# Patient Record
Sex: Female | Born: 1970
Health system: Southern US, Community
[De-identification: ages and names within clinical notes are randomized; demographics above are authoritative.]

## PROBLEM LIST (undated history)

## (undated) DIAGNOSIS — I998 Other disorder of circulatory system: Secondary | ICD-10-CM

## (undated) DIAGNOSIS — I2699 Other pulmonary embolism without acute cor pulmonale: Secondary | ICD-10-CM

## (undated) DIAGNOSIS — K219 Gastro-esophageal reflux disease without esophagitis: Secondary | ICD-10-CM

## (undated) DIAGNOSIS — D649 Anemia, unspecified: Secondary | ICD-10-CM

## (undated) DIAGNOSIS — I10 Essential (primary) hypertension: Secondary | ICD-10-CM

## (undated) DIAGNOSIS — Z803 Family history of malignant neoplasm of breast: Secondary | ICD-10-CM

## (undated) DIAGNOSIS — C50919 Malignant neoplasm of unspecified site of unspecified female breast: Secondary | ICD-10-CM

## (undated) HISTORY — DX: Anemia, unspecified: D64.9

## (undated) HISTORY — DX: Family history of malignant neoplasm of breast: Z80.3

## (undated) HISTORY — PX: TONSILLECTOMY: SUR1361

## (undated) HISTORY — DX: Other disorder of circulatory system: I99.8

## (undated) HISTORY — DX: Gastro-esophageal reflux disease without esophagitis: K21.9

## (undated) HISTORY — DX: Other pulmonary embolism without acute cor pulmonale: I26.99

---

## 2009-10-29 ENCOUNTER — Ambulatory Visit: Payer: Self-pay

## 2013-01-08 ENCOUNTER — Emergency Department: Payer: Self-pay | Admitting: Internal Medicine

## 2013-05-08 ENCOUNTER — Ambulatory Visit: Payer: Self-pay | Admitting: Internal Medicine

## 2014-01-20 DIAGNOSIS — I10 Essential (primary) hypertension: Secondary | ICD-10-CM | POA: Diagnosis present

## 2016-05-31 ENCOUNTER — Other Ambulatory Visit
Admission: RE | Admit: 2016-05-31 | Discharge: 2016-05-31 | Disposition: A | Discharge status: Home / Self Care | Payer: BLUE CROSS/BLUE SHIELD | Source: Ambulatory Visit | Attending: Internal Medicine | Admitting: Internal Medicine

## 2016-05-31 ENCOUNTER — Other Ambulatory Visit: Payer: Self-pay

## 2016-05-31 ENCOUNTER — Encounter: Payer: Self-pay | Admitting: Emergency Medicine

## 2016-05-31 ENCOUNTER — Emergency Department
Admission: EM | Admit: 2016-05-31 | Discharge: 2016-05-31 | Disposition: A | Discharge status: Home / Self Care | Payer: BLUE CROSS/BLUE SHIELD | Attending: Emergency Medicine | Admitting: Emergency Medicine

## 2016-05-31 ENCOUNTER — Emergency Department: Payer: BLUE CROSS/BLUE SHIELD

## 2016-05-31 DIAGNOSIS — R079 Chest pain, unspecified: Secondary | ICD-10-CM | POA: Diagnosis not present

## 2016-05-31 DIAGNOSIS — R1013 Epigastric pain: Secondary | ICD-10-CM

## 2016-05-31 DIAGNOSIS — F17200 Nicotine dependence, unspecified, uncomplicated: Secondary | ICD-10-CM | POA: Insufficient documentation

## 2016-05-31 DIAGNOSIS — R51 Headache: Secondary | ICD-10-CM | POA: Insufficient documentation

## 2016-05-31 DIAGNOSIS — I10 Essential (primary) hypertension: Secondary | ICD-10-CM | POA: Diagnosis not present

## 2016-05-31 DIAGNOSIS — M549 Dorsalgia, unspecified: Secondary | ICD-10-CM | POA: Insufficient documentation

## 2016-05-31 HISTORY — DX: Essential (primary) hypertension: I10

## 2016-05-31 LAB — CBC WITH DIFFERENTIAL/PLATELET
BASOS ABS: 0.1 10*3/uL (ref 0–0.1)
BASOS PCT: 1 %
EOS ABS: 0 10*3/uL (ref 0–0.7)
Eosinophils Relative: 0 %
HEMATOCRIT: 45.6 % (ref 35.0–47.0)
HEMOGLOBIN: 15.6 g/dL (ref 12.0–16.0)
Lymphocytes Relative: 13 %
Lymphs Abs: 1.7 10*3/uL (ref 1.0–3.6)
MCH: 31.7 pg (ref 26.0–34.0)
MCHC: 34.2 g/dL (ref 32.0–36.0)
MCV: 92.7 fL (ref 80.0–100.0)
MONOS PCT: 5 %
Monocytes Absolute: 0.6 10*3/uL (ref 0.2–0.9)
NEUTROS ABS: 10.6 10*3/uL — AB (ref 1.4–6.5)
NEUTROS PCT: 81 %
Platelets: 257 10*3/uL (ref 150–440)
RBC: 4.92 MIL/uL (ref 3.80–5.20)
RDW: 14.1 % (ref 11.5–14.5)
WBC: 13 10*3/uL — AB (ref 3.6–11.0)

## 2016-05-31 LAB — COMPREHENSIVE METABOLIC PANEL
ALT: 21 U/L (ref 14–54)
ANION GAP: 8 (ref 5–15)
AST: 24 U/L (ref 15–41)
Albumin: 4.4 g/dL (ref 3.5–5.0)
Alkaline Phosphatase: 52 U/L (ref 38–126)
BUN: 8 mg/dL (ref 6–20)
CHLORIDE: 104 mmol/L (ref 101–111)
CO2: 25 mmol/L (ref 22–32)
Calcium: 9.1 mg/dL (ref 8.9–10.3)
Creatinine, Ser: 0.65 mg/dL (ref 0.44–1.00)
GFR calc non Af Amer: >60 mL/min (ref >60)
Glucose, Bld: 117 mg/dL — ABNORMAL HIGH (ref 65–99)
POTASSIUM: 3.8 mmol/L (ref 3.5–5.1)
SODIUM: 137 mmol/L (ref 135–145)
Total Bilirubin: 0.6 mg/dL (ref 0.3–1.2)
Total Protein: 7.8 g/dL (ref 6.5–8.1)

## 2016-05-31 LAB — TSH: TSH: 0.39 u[IU]/mL (ref 0.350–4.500)

## 2016-05-31 LAB — TROPONIN I
TROPONIN I: 0.04 ng/mL — AB (ref <0.03)
Troponin I: <0.03 ng/mL (ref <0.03)

## 2016-05-31 LAB — LIPASE, BLOOD: LIPASE: 30 U/L (ref 11–51)

## 2016-05-31 MED ORDER — BUTALBITAL-APAP-CAFFEINE 50-325-40 MG PO TABS
1.0000 | ORAL_TABLET | Freq: Once | ORAL | Status: AC
  Administered 2016-05-31: 1 via ORAL
  Filled 2016-05-31: qty 1

## 2016-05-31 MED ORDER — GI COCKTAIL ~~LOC~~
30.0000 mL | Freq: Once | ORAL | Status: AC
  Administered 2016-05-31: 30 mL via ORAL
  Filled 2016-05-31: qty 30

## 2016-05-31 MED ORDER — BUTALBITAL-APAP-CAFFEINE 50-325-40 MG PO TABS
ORAL_TABLET | ORAL | Status: AC
  Filled 2016-05-31: qty 1

## 2016-05-31 NOTE — ED Provider Notes (Signed)
[facility] Emergency Department Provider Note    ____________________________________________   I have reviewed the triage vital signs and the nursing notes.   HISTORY  Chief Complaint Abnormal lab  History limited by: Not Limited   HPI Penny Chase is a 45 y.o. female who presents to the emergency department today at the request of her primary care physician because of concern for abnormal blood work. Patient saw her physician today for a scheduled annual exam. While there she discussed that for the past few months she has felt bad. She says that she has had decreased energy, headache, occasional chest pain. No chest pressure. No concerning shortness of breath.   Past Medical History:  Diagnosis Date  . Hypertension     There are no active problems to display for this patient.   History reviewed. No pertinent surgical history.  Prior to Admission medications   Not on File    Allergies Review of patient's allergies indicates no known allergies.  History reviewed. No pertinent family history.  Social History Social History  Substance Use Topics  . Smoking status: Former Research scientist (life sciences)  . Smokeless tobacco: Current User  . Alcohol use Yes    Review of Systems  Constitutional: Negative for fever. Cardiovascular: Positive for occasional chest pain. Respiratory: Negative for shortness of breath. Gastrointestinal: Positive for epigastric pain. Genitourinary: Negative for dysuria. Musculoskeletal: Positive for back pain. Skin: Negative for rash. Neurological: Negative for headaches, focal weakness or numbness.  10-point ROS otherwise negative.  ____________________________________________   PHYSICAL EXAM:  VITAL SIGNS: ED Triage Vitals  Enc Vitals Group     BP 05/31/16 1118 (!) 162/108     Pulse Rate 05/31/16 1118 92     Resp 05/31/16 1118 18     Temp 05/31/16 1118 98.9 F (37.2 C)     Temp Source 05/31/16 1118 Oral     SpO2  05/31/16 1118 97 %     Weight 05/31/16 1119 185 lb (83.9 kg)     Height 05/31/16 1119 5\' 5"  (1.651 m)     Head Circumference --      Peak Flow --      Pain Score 05/31/16 1119 9   Constitutional: Alert and oriented. Well appearing and in no distress. Eyes: Conjunctivae are normal. Normal extraocular movements. ENT   Head: Normocephalic and atraumatic.   Nose: No congestion/rhinnorhea.   Mouth/Throat: Mucous membranes are moist.   Neck: No stridor. Hematological/Lymphatic/Immunilogical: No cervical lymphadenopathy. Cardiovascular: Normal rate, regular rhythm.  No murmurs, rubs, or gallops. Respiratory: Normal respiratory effort without tachypnea nor retractions. Breath sounds are clear and equal bilaterally. No wheezes/rales/rhonchi. Gastrointestinal: Soft and nontender. No distention.  Genitourinary: Deferred Musculoskeletal: Normal range of motion in all extremities. No lower extremity edema. Neurologic:  Normal speech and language. No gross focal neurologic deficits are appreciated.  Skin:  Skin is warm, dry and intact. No rash noted. Psychiatric: Mood and affect are normal. Speech and behavior are normal. Patient exhibits appropriate insight and judgment.  ____________________________________________    LABS (pertinent positives/negatives)  Labs Reviewed  CBC WITH DIFFERENTIAL/PLATELET - Abnormal; Notable for the following:       Result Value   WBC 13.0 (*)    Neutro Abs 10.6 (*)    All other components within normal limits  COMPREHENSIVE METABOLIC PANEL - Abnormal; Notable for the following:    Glucose, Bld 117 (*)    All other components within normal limits  TROPONIN I  TSH  LIPASE, BLOOD  TROPONIN I     ____________________________________________   EKG  I, Nance Pear, attending physician, personally viewed and interpreted this EKG  EKG Time: 1116 Rate: 91 Rhythm: normal sinus rhythm Axis: normal Intervals: qtc 462 QRS: narrow ST  changes: no st elevation Impression: normal ekg   ____________________________________________    RADIOLOGY  CXR   IMPRESSION:  No active cardiopulmonary disease.     ____________________________________________   PROCEDURES  Procedures  ____________________________________________   INITIAL IMPRESSION / ASSESSMENT AND PLAN / ED COURSE  Pertinent labs & imaging results that were available during my care of the patient were reviewed by me and considered in my medical decision making (see chart for details).  Patient with outpatient troponin of 0.04. Two troponins here negative. Additionally no other concerning findings on work up and clinical story not concerning for ACS. Feel patient is safe for discharge. Will have patient follow up with pcp. ____________________________________________   FINAL CLINICAL IMPRESSION(S) / ED DIAGNOSES  Final diagnoses:  Nonspecific chest pain     Note: This dictation was prepared with Dragon dictation. Any transcriptional errors that result from this process are unintentional    Nance Pear, MD 05/31/16 1530

## 2016-05-31 NOTE — ED Notes (Signed)
AAOx3.  Skin warm and dry. Patient tearful in room, stating "I just feel so bad".  Emotional support and reassurance given, well received.  Continue to monitor.

## 2016-05-31 NOTE — ED Triage Notes (Signed)
Pt to ed with c/o elevated blood pressure and chest pain since Friday.  Pt states went to see md and was sent here after labs showed elevated troponin. Pt reports chest pressure at triage.  BP 162/108.  Family with pt.

## 2016-05-31 NOTE — ED Notes (Signed)
Pt discharged home after verbalizing understanding of discharge instructions; nad noted. 

## 2016-05-31 NOTE — Discharge Instructions (Signed)
Please seek medical attention for any high fevers, chest pain, shortness of breath, change in behavior, persistent vomiting, bloody stool or any other new or concerning symptoms.  

## 2016-05-31 NOTE — ED Notes (Signed)
AAOx3.  Skin warm and dry.  States "I'm feeling better".  Continue to monitor.

## 2017-01-04 ENCOUNTER — Other Ambulatory Visit: Payer: Self-pay | Admitting: Internal Medicine

## 2017-01-04 DIAGNOSIS — Z1231 Encounter for screening mammogram for malignant neoplasm of breast: Secondary | ICD-10-CM

## 2017-01-04 DIAGNOSIS — K219 Gastro-esophageal reflux disease without esophagitis: Secondary | ICD-10-CM | POA: Diagnosis present

## 2017-01-20 ENCOUNTER — Ambulatory Visit
Admission: RE | Admit: 2017-01-20 | Discharge: 2017-01-20 | Disposition: A | Discharge status: Home / Self Care | Payer: BLUE CROSS/BLUE SHIELD | Source: Ambulatory Visit | Attending: Internal Medicine | Admitting: Internal Medicine

## 2017-01-20 DIAGNOSIS — Z1231 Encounter for screening mammogram for malignant neoplasm of breast: Secondary | ICD-10-CM | POA: Insufficient documentation

## 2018-07-18 ENCOUNTER — Other Ambulatory Visit: Payer: Self-pay | Admitting: Internal Medicine

## 2018-07-18 DIAGNOSIS — Z1231 Encounter for screening mammogram for malignant neoplasm of breast: Secondary | ICD-10-CM

## 2018-08-16 DIAGNOSIS — Z923 Personal history of irradiation: Secondary | ICD-10-CM

## 2018-08-16 HISTORY — DX: Personal history of irradiation: Z92.3

## 2018-08-24 ENCOUNTER — Ambulatory Visit
Admission: RE | Admit: 2018-08-24 | Discharge: 2018-08-24 | Disposition: A | Discharge status: Home / Self Care | Payer: Self-pay | Source: Ambulatory Visit | Attending: Internal Medicine | Admitting: Internal Medicine

## 2018-08-24 DIAGNOSIS — Z1231 Encounter for screening mammogram for malignant neoplasm of breast: Secondary | ICD-10-CM | POA: Insufficient documentation

## 2018-08-28 ENCOUNTER — Other Ambulatory Visit: Payer: Self-pay | Admitting: Internal Medicine

## 2018-08-28 DIAGNOSIS — R928 Other abnormal and inconclusive findings on diagnostic imaging of breast: Secondary | ICD-10-CM

## 2018-09-06 ENCOUNTER — Ambulatory Visit
Admission: RE | Admit: 2018-09-06 | Discharge: 2018-09-06 | Disposition: A | Discharge status: Home / Self Care | Payer: BLUE CROSS/BLUE SHIELD | Source: Ambulatory Visit | Attending: Internal Medicine | Admitting: Internal Medicine

## 2018-09-06 DIAGNOSIS — R928 Other abnormal and inconclusive findings on diagnostic imaging of breast: Secondary | ICD-10-CM | POA: Diagnosis present

## 2018-09-07 ENCOUNTER — Other Ambulatory Visit: Payer: Self-pay | Admitting: Internal Medicine

## 2018-09-07 DIAGNOSIS — R928 Other abnormal and inconclusive findings on diagnostic imaging of breast: Secondary | ICD-10-CM

## 2018-09-07 DIAGNOSIS — N6310 Unspecified lump in the right breast, unspecified quadrant: Secondary | ICD-10-CM

## 2018-09-12 ENCOUNTER — Ambulatory Visit
Admission: RE | Admit: 2018-09-12 | Discharge: 2018-09-12 | Disposition: A | Discharge status: Home / Self Care | Payer: BLUE CROSS/BLUE SHIELD | Source: Ambulatory Visit | Attending: Internal Medicine | Admitting: Internal Medicine

## 2018-09-12 DIAGNOSIS — R928 Other abnormal and inconclusive findings on diagnostic imaging of breast: Secondary | ICD-10-CM

## 2018-09-12 DIAGNOSIS — N6310 Unspecified lump in the right breast, unspecified quadrant: Secondary | ICD-10-CM

## 2018-09-12 HISTORY — PX: BREAST BIOPSY: SHX20

## 2018-09-13 ENCOUNTER — Other Ambulatory Visit: Payer: Self-pay | Admitting: Specialist

## 2018-09-16 DIAGNOSIS — C801 Malignant (primary) neoplasm, unspecified: Secondary | ICD-10-CM

## 2018-09-16 HISTORY — DX: Malignant (primary) neoplasm, unspecified: C80.1

## 2018-09-18 ENCOUNTER — Encounter: Payer: Self-pay | Admitting: *Deleted

## 2018-09-18 ENCOUNTER — Other Ambulatory Visit: Payer: Self-pay | Admitting: Pathology

## 2018-09-18 DIAGNOSIS — C50911 Malignant neoplasm of unspecified site of right female breast: Secondary | ICD-10-CM

## 2018-09-18 LAB — SURGICAL PATHOLOGY

## 2018-09-18 NOTE — Progress Notes (Signed)
Patient returned my call.  Informed her of her appointment to see Dr. Peyton Najjar on 09/19/18 @ 3:15 and Dr. Rogue Bussing on 10/19/18 @ 3;00.

## 2018-09-18 NOTE — Progress Notes (Signed)
Tried to call patient back with her appointments, but no answer and no voicemail.  Will call her back again later today.

## 2018-09-18 NOTE — Progress Notes (Signed)
  Oncology Nurse Navigator Documentation  Navigator Location: [facility] (09/18/18 1300) Referral date to RadOnc/MedOnc: 09/20/18 (09/18/18 1300) )Navigator Encounter Type: Introductory phone call (09/18/18 1300)   Abnormal Finding Date: 09/06/18 (09/18/18 1300) Confirmed Diagnosis Date: 10/12/18 (09/18/18 1300)                   Barriers/Navigation Needs: Coordination of Care (09/18/18 1300)   Interventions: Coordination of Care (09/18/18 1300)   Coordination of Care: Appts (09/18/18 1300)                  Time Spent with Patient: 45 (09/18/18 1300)   Called patient to establish navigation services.  She is newly diagnosed with invasive mammary carcinoma.  She has been given the results of her biopsy by the radiologist and was informed one of the navigators would give her a call.  States she would like to see Dr. Rogue Bussing and Dr. Peyton Najjar since she usually goes to [facility].  I will call her back with her appointments.

## 2018-09-18 NOTE — Addendum Note (Signed)
Addended by: Rico Junker on: 09/18/2018 02:15 PM   Modules accepted: Orders

## 2018-09-19 ENCOUNTER — Other Ambulatory Visit: Payer: Self-pay | Admitting: General Surgery

## 2018-09-19 ENCOUNTER — Ambulatory Visit: Payer: Self-pay | Admitting: General Surgery

## 2018-09-19 DIAGNOSIS — C50411 Malignant neoplasm of upper-outer quadrant of right female breast: Secondary | ICD-10-CM

## 2018-09-19 DIAGNOSIS — Z170 Estrogen receptor positive status [ER+]: Principal | ICD-10-CM

## 2018-09-19 NOTE — H&P (Signed)
PATIENT PROFILE: Penny Chase is a 48 y.o. female who presents to the Clinic for consultation at the request of Dr. Ouida Sills for evaluation of right breast cancer.  PCP:  Harrold Donath, MD  HISTORY OF PRESENT ILLNESS: [name] reports she had screening mammogram. She has never had biopsy before. On the screening mammogram done on 08/24/2018. It was found right breast distortion. There was no findings suspicious of malignancy. A diagnostic mammogram and ultrasound were done on 09/06/2018 that confirmed the a 1.1 cm mass on the right breast at the 10 o'clock position. There was no suspicious lymph node on the axilla. Ultrasound guided biopsy shows a invasive mammary carcinoma with lobular features, grade 1, ER/PR positive, 6 mm on that sections.   Patient denies any breast pain, palpable mass, skin changes, nipple discharge or retraction.   Family history of breast cancer: Aunt from father side Family history of other cancers: None Menarche: 11 years Menopause: n/a Used OCP: for 19 years Used estrogen and progesterone therapy: none Pregnancies: none History of Radiation to the chest: none Previous biopsy: nonediaz  PROBLEM LIST:         Problem List  Date Reviewed: 01/10/2018         Noted   Gastroesophageal reflux disease without esophagitis 01/04/2017   Overview    Helped by prn ppi      Health care maintenance 05/31/2016   Overview    Mammogram 9-14. Pap nl 9-14 and wants to do 2020. Tetanus 2014      HTN (hypertension), benign 01/20/2014      GENERAL REVIEW OF SYSTEMS:   General ROS: negative for - chills, fever, weight gain or weight loss. Positive for fatigue Allergy and Immunology ROS: negative for - hives  Hematological and Lymphatic ROS: negative for - bleeding problems or bruising, negative for palpable nodes Endocrine ROS: negative for - heat or cold intolerance, hair changes Respiratory ROS: positive for - cough, Negative for  shortness of breath or wheezing Cardiovascular ROS: no chest pain or palpitations GI ROS: negative for nausea, vomiting, abdominal pain, diarrhea. Positive for constipation Musculoskeletal ROS: negative for - joint swelling or muscle pain Neurological ROS: negative for - confusion, syncope Dermatological ROS: negative for pruritus and rash Psychiatric: negative for anxiety, depression, and memory loss. Positive for difficulty sleeping   MEDICATIONS: CurrentMedications        Current Outpatient Medications  Medication Sig Dispense Refill  . amLODIPine (NORVASC) 5 MG tablet Take 1 tablet (5 mg total) by mouth once daily 90 tablet 3  . pantoprazole (PROTONIX) 40 MG DR tablet Take 1 tablet (40 mg total) by mouth once daily as needed 90 tablet 3   No current facility-administered medications for this visit.       ALLERGIES: Patient has no known allergies.  PAST MEDICAL HISTORY:     Past Medical History:  Diagnosis Date  . HTN (hypertension), benign 01/20/2014    PAST SURGICAL HISTORY:      Past Surgical History:  Procedure Laterality Date  . TONSILLECTOMY       FAMILY HISTORY:      Family History  Problem Relation Age of Onset  . Diabetes type II Mother      SOCIAL HISTORY: Social History          Socioeconomic History  . Marital status: Married    Spouse name: Not on file  . Number of children: Not on file  . Years of education: Not on file  .  Highest education level: Not on file  Occupational History  . Not on file  Social Needs  . Financial resource strain: Not on file  . Food insecurity:    Worry: Not on file    Inability: Not on file  . Transportation needs:    Medical: Not on file    Non-medical: Not on file  Tobacco Use  . Smoking status: Never Smoker  . Smokeless tobacco: Never Used  Substance and Sexual Activity  . Alcohol use: No  . Drug use: Not on file  . Sexual activity: Not on file  Other Topics Concern  . Not on  file  Social History Narrative  . Not on file      PHYSICAL EXAM:    Vitals:   09/19/18 1505  BP: (!) 150/96  Pulse: 77   Body mass index is 29.24 kg/m. Weight: 79.7 kg (175 lb 11.3 oz)   GENERAL: Alert, active, oriented x3  HEENT: Pupils equal reactive to light. Extraocular movements are intact. Sclera clear. Palpebral conjunctiva normal red color.Pharynx clear.  NECK: Supple with no palpable mass and no adenopathy.  LUNGS: Sound clear with no rales rhonchi or wheezes.  HEART: Regular rhythm S1 and S2 without murmur.  BREAST: both breast appear normal, no suspicious masses, no skin or nipple changes or axillary nodes. There was a small bruise on the lateral aspect of the right breast.   ABDOMEN: Soft and depressible, nontender with no palpable mass, no hepatomegaly.  EXTREMITIES: Well-developed well-nourished symmetrical with no dependent edema.  NEUROLOGICAL: Awake alert oriented, facial expression symmetrical, moving all extremities.  REVIEW OF DATA: I have reviewed the following data today:      Appointment on 07/11/2018  Component Date Value  . Glucose 07/11/2018 102   . Sodium 07/11/2018 140   . Potassium 07/11/2018 4.3   . Chloride 07/11/2018 102   . Carbon Dioxide (CO2) 07/11/2018 27.4   . Calcium 07/11/2018 9.7   . Urea Nitrogen (BUN) 07/11/2018 8   . Creatinine 07/11/2018 0.6   . Glomerular Filtration Ra* 07/11/2018 107   . BUN/Crea Ratio 07/11/2018 13.3   . Anion Gap w/K 07/11/2018 14.9   . Hemoglobin A1C 07/11/2018 5.5   . Average Blood Glucose (C* 07/11/2018 111   . Protein, Total 07/11/2018 7.7   . Albumin 07/11/2018 4.5   . Bilirubin, Total 07/11/2018 0.4   . Bilirubin, Conjugated 07/11/2018 0.10   . Alk Phos (alkaline Phosp* 07/11/2018 82   . AST  07/11/2018 17   . ALT  07/11/2018 17   . Cholesterol, Total 07/11/2018 223*  . Triglyceride 07/11/2018 157   . HDL (High Density Lipopr* 07/11/2018 39.6   . LDL (Low Density  Lipopro* 07/11/2018 152*  . VLDL Cholesterol 07/11/2018 31   . Cholesterol/HDL Ratio 07/11/2018 5.6     SPECIMEN SUBMITTED: A. Breast, right, 10:00, 6 CMFN  CLINICAL HISTORY: Right breast mass  PRE-OPERATIVE DIAGNOSIS: Suspect malignancy  POST-OPERATIVE DIAGNOSIS: Venus shaped biopsy clip deployed 10 o'clock position  DIAGNOSIS: A. BREAST, RIGHT, 10:00, 6 CM FROM NIPPLE; ULTRASOUND-GUIDED BIOPSY: - INVASIVE MAMMARY CARCINOMA, WITH LOBULAR FEATURES.  Size of invasive carcinoma: 6 mm in this sample Histologic grade of invasive carcinoma: Grade 1            Glandular/tubular differentiation score: 3            Nuclear pleomorphism score: 1            Mitotic rate score: 1  Total score: 5 Ductal carcinoma in situ: Not identified Lymphovascular invasion: Not identified  ER/PR/HER2: Immunohistochemistry will be performed on block A1, with reflex to [place] for HER2 2+. The results will be reported in an addendum.  Comment: The definitive grade will be assigned on the excisional specimen. These findings were communicated to Dannial Monarch in Dr. Johnston Ebbs office on September 13, 2018. Read back procedure was performed.  GROSS DESCRIPTION: A. Labeled: Right breast, 10:00, 6 cm from nipple Received: Formalin Time/date in fixative: 12:58 PM on 09/12/2018 Cold ischemic time: Less than 1 hour Total fixation time: 8 hours Core pieces: 3 Size: Ranging from 1.3 cm up to 1.5 cm in length and 0.2 cm in diameter Description: tan-white fibrous cores Ink color: Blue Entirely submitted in A1-A2.  Final Diagnosis performed by Raynelle Bring, MD.  Electronically signed 09/13/2018 12:43:24PM The electronic signature indicates that the named Attending Pathologist has evaluated the specimen  Technical component performed at [facility], [address] [place], [place] [address] Lab: [phone] Dir: Rush Farmer, MD, MMM Professional component  performed at [facility], [facility], [place], [place], [place] [address] Lab: [phone] Dir: Dellia Nims. Rubinas, MD  ADDENDUM: BREAST BIOMARKER TESTS Estrogen Receptor (ER) Status: POSITIVE            Percentage of cells with nuclear positivity:> 90%            Average intensity of staining: Moderate  Progesterone Receptor (PgR) Status: POSITIVE            Percentage of cells with nuclear positivity:> 90%            Average intensity of staining: Strong  HER2 (by immunohistochemistry): NEGATIVE (Score 0)  I personally evaluated the screening, diagnostic and post biopsy mammogram with marker.   I evaluated the biopsy report and discussed with patient in details.   ASSESSMENT: Ms. Seaborn is a 48 y.o. female presenting for consultation for breast cancer.    Patient was oriented again about the pathology results. Surgical alternatives were discussed with patient including partial vs total mastectomy. Surgical technique and post operative care was discussed with patient. Risk of surgery was discussed with patient including but not limited to: wound infection, seroma, hematoma, brachial plexopathy, mondor's disease (thrombosis of small veins of breast), chronic wound pain, breast lymphedema, altered sensation to the nipple and cosmesis among others.   Patient was oriented that due to the lobular features in the biopsy report, MRI is recommended to rule out any suspicious lesion on the contralateral breast. She understood. If this is negative patient agreed to proceed with partial mastectomy with sentinel lymph node biopsy.   Patient has an appointment tomorrow with Dr. Rogue Bussing were other oncologic treatment of breast cancer will be discussed. Will also participate on the breast conference when her case will be discussed.   Malignant neoplasm of upper-outer quadrant of right breast in female, estrogen receptor positive  (CMS-HCC) [C50.411, Z17.0]  PLAN: 1. Bilateral breast MRI with and without contrast 2.  Right breast partial mastectomy (25498) with Right Axillary sentinel lymph node biopsy (26415) 3. CBC, CMP 4. Will call you back with MRI results and further recommendations 5. Contact us if has any question or concern.   Patient verbalized understanding, all questions were answered, and were agreeable with the plan outlined above.   This was a more than 80 minute encounter most of the time counseling patient and coordinating plan of care.   Herbert Pun, MD  Electronically signed by Reeves Forth  Windell Moment, MD

## 2018-09-19 NOTE — H&P (View-Only) (Signed)
PATIENT PROFILE: Penny Chase is a 48 y.o. female who presents to the Clinic for consultation at the request of Dr. Anderson for evaluation of right breast cancer.  PCP:  Anderson, Marshall Wilson III, MD  HISTORY OF PRESENT ILLNESS: Penny Chase reports she had screening mammogram. She has never had biopsy before. On the screening mammogram done on 08/24/2018. It was found right breast distortion. There was no findings suspicious of malignancy. A diagnostic mammogram and ultrasound were done on 09/06/2018 that confirmed the a 1.1 cm mass on the right breast at the 10 o'clock position. There was no suspicious lymph node on the axilla. Ultrasound guided biopsy shows a invasive mammary carcinoma with lobular features, grade 1, ER/PR positive, 6 mm on that sections.   Patient denies any breast pain, palpable mass, skin changes, nipple discharge or retraction.   Family history of breast cancer: Aunt from father side Family history of other cancers: None Menarche: 11 years Menopause: n/a Used OCP: for 19 years Used estrogen and progesterone therapy: none Pregnancies: none History of Radiation to the chest: none Previous biopsy: nonediaz  PROBLEM LIST:         Problem List  Date Reviewed: 01/10/2018         Noted   Gastroesophageal reflux disease without esophagitis 01/04/2017   Overview    Helped by prn ppi      Health care maintenance 05/31/2016   Overview    Mammogram 9-14. Pap nl 9-14 and wants to do 2020. Tetanus 2014      HTN (hypertension), benign 01/20/2014      GENERAL REVIEW OF SYSTEMS:   General ROS: negative for - chills, fever, weight gain or weight loss. Positive for fatigue Allergy and Immunology ROS: negative for - hives  Hematological and Lymphatic ROS: negative for - bleeding problems or bruising, negative for palpable nodes Endocrine ROS: negative for - heat or cold intolerance, hair changes Respiratory ROS: positive for - cough, Negative for  shortness of breath or wheezing Cardiovascular ROS: no chest pain or palpitations GI ROS: negative for nausea, vomiting, abdominal pain, diarrhea. Positive for constipation Musculoskeletal ROS: negative for - joint swelling or muscle pain Neurological ROS: negative for - confusion, syncope Dermatological ROS: negative for pruritus and rash Psychiatric: negative for anxiety, depression, and memory loss. Positive for difficulty sleeping   MEDICATIONS: CurrentMedications        Current Outpatient Medications  Medication Sig Dispense Refill  . amLODIPine (NORVASC) 5 MG tablet Take 1 tablet (5 mg total) by mouth once daily 90 tablet 3  . pantoprazole (PROTONIX) 40 MG DR tablet Take 1 tablet (40 mg total) by mouth once daily as needed 90 tablet 3   No current facility-administered medications for this visit.       ALLERGIES: Patient has no known allergies.  PAST MEDICAL HISTORY:     Past Medical History:  Diagnosis Date  . HTN (hypertension), benign 01/20/2014    PAST SURGICAL HISTORY:      Past Surgical History:  Procedure Laterality Date  . TONSILLECTOMY       FAMILY HISTORY:      Family History  Problem Relation Age of Onset  . Diabetes type II Mother      SOCIAL HISTORY: Social History          Socioeconomic History  . Marital status: Married    Spouse name: Not on file  . Number of children: Not on file  . Years of education: Not on file  .   Highest education level: Not on file  Occupational History  . Not on file  Social Needs  . Financial resource strain: Not on file  . Food insecurity:    Worry: Not on file    Inability: Not on file  . Transportation needs:    Medical: Not on file    Non-medical: Not on file  Tobacco Use  . Smoking status: Never Smoker  . Smokeless tobacco: Never Used  Substance and Sexual Activity  . Alcohol use: No  . Drug use: Not on file  . Sexual activity: Not on file  Other Topics Concern  . Not on  file  Social History Narrative  . Not on file      PHYSICAL EXAM:    Vitals:   09/19/18 1505  BP: (!) 150/96  Pulse: 77   Body mass index is 29.24 kg/m. Weight: 79.7 kg (175 lb 11.3 oz)   GENERAL: Alert, active, oriented x3  HEENT: Pupils equal reactive to light. Extraocular movements are intact. Sclera clear. Palpebral conjunctiva normal red color.Pharynx clear.  NECK: Supple with no palpable mass and no adenopathy.  LUNGS: Sound clear with no rales rhonchi or wheezes.  HEART: Regular rhythm S1 and S2 without murmur.  BREAST: both breast appear normal, no suspicious masses, no skin or nipple changes or axillary nodes. There was a small bruise on the lateral aspect of the right breast.   ABDOMEN: Soft and depressible, nontender with no palpable mass, no hepatomegaly.  EXTREMITIES: Well-developed well-nourished symmetrical with no dependent edema.  NEUROLOGICAL: Awake alert oriented, facial expression symmetrical, moving all extremities.  REVIEW OF DATA: I have reviewed the following data today:      Appointment on 07/11/2018  Component Date Value  . Glucose 07/11/2018 102   . Sodium 07/11/2018 140   . Potassium 07/11/2018 4.3   . Chloride 07/11/2018 102   . Carbon Dioxide (CO2) 07/11/2018 27.4   . Calcium 07/11/2018 9.7   . Urea Nitrogen (BUN) 07/11/2018 8   . Creatinine 07/11/2018 0.6   . Glomerular Filtration Ra* 07/11/2018 107   . BUN/Crea Ratio 07/11/2018 13.3   . Anion Gap w/K 07/11/2018 14.9   . Hemoglobin A1C 07/11/2018 5.5   . Average Blood Glucose (C* 07/11/2018 111   . Protein, Total 07/11/2018 7.7   . Albumin 07/11/2018 4.5   . Bilirubin, Total 07/11/2018 0.4   . Bilirubin, Conjugated 07/11/2018 0.10   . Alk Phos (alkaline Phosp* 07/11/2018 82   . AST  07/11/2018 17   . ALT  07/11/2018 17   . Cholesterol, Total 07/11/2018 223*  . Triglyceride 07/11/2018 157   . HDL (High Density Lipopr* 07/11/2018 39.6   . LDL (Low Density  Lipopro* 07/11/2018 152*  . VLDL Cholesterol 07/11/2018 31   . Cholesterol/HDL Ratio 07/11/2018 5.6     SPECIMEN SUBMITTED: A. Breast, right, 10:00, 6 CMFN  CLINICAL HISTORY: Right breast mass  PRE-OPERATIVE DIAGNOSIS: Suspect malignancy  POST-OPERATIVE DIAGNOSIS: Venus shaped biopsy clip deployed 10 o'clock position  DIAGNOSIS: A. BREAST, RIGHT, 10:00, 6 CM FROM NIPPLE; ULTRASOUND-GUIDED BIOPSY: - INVASIVE MAMMARY CARCINOMA, WITH LOBULAR FEATURES.  Size of invasive carcinoma: 6 mm in this sample Histologic grade of invasive carcinoma: Grade 1            Glandular/tubular differentiation score: 3            Nuclear pleomorphism score: 1            Mitotic rate score: 1              Total score: 5 Ductal carcinoma in situ: Not identified Lymphovascular invasion: Not identified  ER/PR/HER2: Immunohistochemistry will be performed on block A1, with reflex to FISH for HER2 2+. The results will be reported in an addendum.  Comment: The definitive grade will be assigned on the excisional specimen. These findings were communicated to Joanne Yokeley in Dr. Jarosz's office on September 13, 2018. Read back procedure was performed.  GROSS DESCRIPTION: A. Labeled: Right breast, 10:00, 6 cm from nipple Received: Formalin Time/date in fixative: 12:58 PM on 09/12/2018 Cold ischemic time: Less than 1 hour Total fixation time: 8 hours Core pieces: 3 Size: Ranging from 1.3 cm up to 1.5 cm in length and 0.2 cm in diameter Description: tan-white fibrous cores Ink color: Blue Entirely submitted in A1-A2.  Final Diagnosis performed by Ernest Andree, MD.  Electronically signed 09/13/2018 12:43:24PM The electronic signature indicates that the named Attending Pathologist has evaluated the specimen  Technical component performed at [facility], [address], [place], [place] [address] Lab: [phone] Dir: Sanjai Nagendra, MD, MMM Professional component  performed at [facility], [facility], [address], [place], [place] [address] Lab: [phone] Dir: Tara C. Rubinas, MD  ADDENDUM: BREAST BIOMARKER TESTS Estrogen Receptor (ER) Status: POSITIVE            Percentage of cells with nuclear positivity:> 90%            Average intensity of staining: Moderate  Progesterone Receptor (PgR) Status: POSITIVE            Percentage of cells with nuclear positivity:> 90%            Average intensity of staining: Strong  HER2 (by immunohistochemistry): NEGATIVE (Score 0)  I personally evaluated the screening, diagnostic and post biopsy mammogram with marker.   I evaluated the biopsy report and discussed with patient in details.   ASSESSMENT: Ms. Gaertner is a 48 y.o. female presenting for consultation for breast cancer.    Patient was oriented again about the pathology results. Surgical alternatives were discussed with patient including partial vs total mastectomy. Surgical technique and post operative care was discussed with patient. Risk of surgery was discussed with patient including but not limited to: wound infection, seroma, hematoma, brachial plexopathy, mondor's disease (thrombosis of small veins of breast), chronic wound pain, breast lymphedema, altered sensation to the nipple and cosmesis among others.   Patient was oriented that due to the lobular features in the biopsy report, MRI is recommended to rule out any suspicious lesion on the contralateral breast. She understood. If this is negative patient agreed to proceed with partial mastectomy with sentinel lymph node biopsy.   Patient has an appointment tomorrow with Dr. Brahmanday were other oncologic treatment of breast cancer will be discussed. Will also participate on the breast conference when her case will be discussed.   Malignant neoplasm of upper-outer quadrant of right breast in female, estrogen receptor positive  (CMS-HCC) [C50.411, Z17.0]  PLAN: 1. Bilateral breast MRI with and without contrast 2.  Right breast partial mastectomy (19301) with Right Axillary sentinel lymph node biopsy (38525) 3. CBC, CMP 4. Will call you back with MRI results and further recommendations 5. Contact us if has any question or concern.   Patient verbalized understanding, all questions were answered, and were agreeable with the plan outlined above.   This was a more than 80 minute encounter most of the time counseling patient and coordinating plan of care.    Cintron-Diaz, MD  Electronically signed by    Windell Moment, MD

## 2018-09-20 ENCOUNTER — Encounter: Payer: Self-pay | Admitting: Internal Medicine

## 2018-09-20 ENCOUNTER — Inpatient Hospital Stay: Payer: BLUE CROSS/BLUE SHIELD | Attending: Internal Medicine | Admitting: Internal Medicine

## 2018-09-20 ENCOUNTER — Encounter: Payer: Self-pay | Admitting: *Deleted

## 2018-09-20 ENCOUNTER — Other Ambulatory Visit: Payer: Self-pay | Admitting: General Surgery

## 2018-09-20 ENCOUNTER — Other Ambulatory Visit: Payer: Self-pay

## 2018-09-20 ENCOUNTER — Other Ambulatory Visit: Payer: Self-pay | Admitting: *Deleted

## 2018-09-20 ENCOUNTER — Telehealth: Payer: Self-pay | Admitting: Internal Medicine

## 2018-09-20 VITALS — BP 138/85 | HR 71 | Temp 99.0°F | Resp 20 | Ht 65.0 in | Wt 175.0 lb

## 2018-09-20 DIAGNOSIS — C50411 Malignant neoplasm of upper-outer quadrant of right female breast: Secondary | ICD-10-CM

## 2018-09-20 DIAGNOSIS — I10 Essential (primary) hypertension: Secondary | ICD-10-CM | POA: Insufficient documentation

## 2018-09-20 DIAGNOSIS — Z803 Family history of malignant neoplasm of breast: Secondary | ICD-10-CM | POA: Diagnosis not present

## 2018-09-20 DIAGNOSIS — Z170 Estrogen receptor positive status [ER+]: Secondary | ICD-10-CM | POA: Diagnosis not present

## 2018-09-20 DIAGNOSIS — R300 Dysuria: Secondary | ICD-10-CM | POA: Diagnosis not present

## 2018-09-20 DIAGNOSIS — Z87891 Personal history of nicotine dependence: Secondary | ICD-10-CM

## 2018-09-20 LAB — URINALYSIS, COMPLETE (UACMP) WITH MICROSCOPIC
Bacteria, UA: NONE SEEN
Bilirubin Urine: NEGATIVE
Glucose, UA: NEGATIVE mg/dL
Ketones, ur: NEGATIVE mg/dL
Leukocytes, UA: NEGATIVE
Nitrite: NEGATIVE
PH: 5 (ref 5.0–8.0)
Protein, ur: NEGATIVE mg/dL
RBC / HPF: >50 RBC/hpf — ABNORMAL HIGH (ref 0–5)
SPECIFIC GRAVITY, URINE: 1.026 (ref 1.005–1.030)

## 2018-09-20 NOTE — Progress Notes (Signed)
[place] NOTE  Patient Care Team: Kirk Ruths, MD as PCP - General (Internal Medicine)  CHIEF COMPLAINTS/PURPOSE OF CONSULTATION:  Breast cancer  #  Oncology History   # JAN 2020- INVASIVE MAMMARY CARCINOMA, WITH LOBULAR FEATURES. cT1 Grade 1; Stage I; ER/PR >90%; her-2 neu-NEG. Awaiting surgery/Dr.Cintron    DIAGNOSIS: Right breast cancer  STAGE:     1    ;GOALS: Cure  CURRENT/MOST RECENT THERAPY: Awaiting surgery        Carcinoma of upper-outer quadrant of right breast in female, estrogen receptor positive ([place])   09/20/2018 Initial Diagnosis    Carcinoma of upper-outer quadrant of right breast in female, estrogen receptor positive ([place])      HISTORY OF PRESENTING ILLNESS:  Penny Chase 48 y.o.  female with no significant past medical history except for hypertension has been referred for further evaluation recommendations for breast cancer.  Patient had a screening mammogram that was abnormal which led to further work-up with a diagnostic mammogram/ultrasound that showed approximately 1 cm sized 10:00 mass concerning for malignancy.  Subsequent underwent biopsy/pathology as above.  Patient denies any bone pain.  Any lumps or bumps also.  No prior history of breast biopsies.  No history of any cancer in parents/or siblings.    Review of Systems  Constitutional: Negative for chills, diaphoresis, fever, malaise/fatigue and weight loss.  HENT: Negative for nosebleeds and sore throat.   Eyes: Negative for double vision.  Respiratory: Negative for cough, hemoptysis, sputum production, shortness of breath and wheezing.   Cardiovascular: Negative for chest pain, palpitations, orthopnea and leg swelling.  Gastrointestinal: Negative for abdominal pain, blood in stool, constipation, diarrhea, heartburn, melena, nausea and vomiting.  Genitourinary: Positive for dysuria, frequency and urgency.  Musculoskeletal: Negative for back pain and joint pain.   Skin: Negative.  Negative for itching and rash.  Neurological: Negative for dizziness, tingling, focal weakness, weakness and headaches.  Endo/Heme/Allergies: Does not bruise/bleed easily.  Psychiatric/Behavioral: Negative for depression. The patient is not nervous/anxious and does not have insomnia.      MEDICAL HISTORY:  Past Medical History:  Diagnosis Date  . Hypertension     SURGICAL HISTORY: Past Surgical History:  Procedure Laterality Date  . BREAST BIOPSY Right 09/12/2018   [place] core ba path pending venus clip    SOCIAL HISTORY: Social History   Socioeconomic History  . Marital status: Married    Spouse name: Not on file  . Number of children: Not on file  . Years of education: Not on file  . Highest education level: Not on file  Occupational History  . Not on file  Social Needs  . Financial resource strain: Not on file  . Food insecurity:    Worry: Not on file    Inability: Not on file  . Transportation needs:    Medical: Not on file    Non-medical: Not on file  Tobacco Use  . Smoking status: Former Research scientist (life sciences)  . Smokeless tobacco: Current User  Substance and Sexual Activity  . Alcohol use: Yes  . Drug use: No  . Sexual activity: Not on file  Lifestyle  . Physical activity:    Days per week: Not on file    Minutes per session: Not on file  . Stress: Not on file  Relationships  . Social connections:    Talks on phone: Not on file    Gets together: Not on file    Attends religious service: Not on file  Active member of club or organization: Not on file    Attends meetings of clubs or organizations: Not on file    Relationship status: Not on file  . Intimate partner violence:    Fear of current or ex partner: Not on file    Emotionally abused: Not on file    Physically abused: Not on file    Forced sexual activity: Not on file  Other Topics Concern  . Not on file  Social History Narrative   Works at [employer];  Smoke 15 cig/day; No  alcohol abuse; in [employer]; with husband.     FAMILY HISTORY: Family History  Problem Relation Age of Onset  . Breast cancer Paternal Aunt     ALLERGIES:  has No Known Allergies.  MEDICATIONS:  Current Outpatient Medications  Medication Sig Dispense Refill  . amLODipine (NORVASC) 5 MG tablet Take 5 mg by mouth daily.    . pantoprazole (PROTONIX) 40 MG tablet Take 1 tablet by mouth daily.     No current facility-administered medications for this visit.       Marland Kitchen  PHYSICAL EXAMINATION: ECOG PERFORMANCE STATUS: 0 - Asymptomatic  Vitals:   09/20/18 1518  BP: 138/85  Pulse: 71  Resp: 20  Temp: 99 F (37.2 C)   Filed Weights   09/20/18 1518  Weight: 175 lb (79.4 kg)    Physical Exam  Constitutional: She is oriented to person, place, and time and well-developed, well-nourished, and in no distress.  HENT:  Head: Normocephalic and atraumatic.  Mouth/Throat: Oropharynx is clear and moist. No oropharyngeal exudate.  Eyes: Pupils are equal, round, and reactive to light.  Neck: Normal range of motion. Neck supple.  Cardiovascular: Normal rate and regular rhythm.  Pulmonary/Chest: No respiratory distress. She has no wheezes.  Abdominal: Soft. Bowel sounds are normal. She exhibits no distension and no mass. There is no abdominal tenderness. There is no rebound and no guarding.  Musculoskeletal: Normal range of motion.        General: No tenderness or edema.  Neurological: She is alert and oriented to person, place, and time.  Skin: Skin is warm.  Psychiatric: Affect normal.     LABORATORY DATA:  I have reviewed the data as listed Lab Results  Component Value Date   WBC 13.0 (H) 05/31/2016   HGB 15.6 05/31/2016   HCT 45.6 05/31/2016   MCV 92.7 05/31/2016   PLT 257 05/31/2016   No results for input(s): NA, K, CL, CO2, GLUCOSE, BUN, CREATININE, CALCIUM, GFRNONAA, GFRAA, PROT, ALBUMIN, AST, ALT, ALKPHOS, BILITOT, BILIDIR, IBILI in the last 8760  hours.  RADIOGRAPHIC STUDIES: I have personally reviewed the radiological images as listed and agreed with the findings in the report. US Breast Ltd Uni Right Inc Axilla  Result Date: 09/06/2018 CLINICAL DATA:  Patient presents for additional views of the right breast as follow-up to recent screening exam to evaluate possible distortion. EXAM: DIGITAL DIAGNOSTIC right MAMMOGRAM WITH TOMO ULTRASOUND right BREAST COMPARISON:  Previous exam(s). ACR Breast Density Category b: There are scattered areas of fibroglandular density. FINDINGS: Additional spot compression and true lateral images demonstrate an irregular mass with distortion and a few associated microcalcifications over the upper outer quadrant of the right breast. Remainder of the right breast is unchanged. Targeted ultrasound is performed, showing an irregular shaped hypoechoic mass with indistinct margins and distal shadowing at the 10 o'clock position of the right breast 6 cm from the nipple corresponding to the mammographic finding. This measures  0.7 x 1 x 1.1 cm. Ultrasound of the right axilla is within normal. IMPRESSION: Suspicious mass over the 10 o'clock position of the right breast 6 cm from the nipple measuring 0.7 x 1 x 1.1 cm. RECOMMENDATION: Recommend ultrasound-guided core needle biopsy for further evaluation. I have discussed the findings and recommendations with the patient. Results were also provided in writing at the conclusion of the visit. If applicable, a reminder letter will be sent to the patient regarding the next appointment. BI-RADS CATEGORY  5: Highly suggestive of malignancy. Patient will be scheduled for biopsy here at the [facility] Electronically Signed   By: Marin Olp M.D.   On: 09/06/2018 16:41   Mm Diag Breast Tomo Uni Right  Result Date: 09/06/2018 CLINICAL DATA:  Patient presents for additional views of the right breast as follow-up to recent screening exam to evaluate possible distortion. EXAM:  DIGITAL DIAGNOSTIC right MAMMOGRAM WITH TOMO ULTRASOUND right BREAST COMPARISON:  Previous exam(s). ACR Breast Density Category b: There are scattered areas of fibroglandular density. FINDINGS: Additional spot compression and true lateral images demonstrate an irregular mass with distortion and a few associated microcalcifications over the upper outer quadrant of the right breast. Remainder of the right breast is unchanged. Targeted ultrasound is performed, showing an irregular shaped hypoechoic mass with indistinct margins and distal shadowing at the 10 o'clock position of the right breast 6 cm from the nipple corresponding to the mammographic finding. This measures 0.7 x 1 x 1.1 cm. Ultrasound of the right axilla is within normal. IMPRESSION: Suspicious mass over the 10 o'clock position of the right breast 6 cm from the nipple measuring 0.7 x 1 x 1.1 cm. RECOMMENDATION: Recommend ultrasound-guided core needle biopsy for further evaluation. I have discussed the findings and recommendations with the patient. Results were also provided in writing at the conclusion of the visit. If applicable, a reminder letter will be sent to the patient regarding the next appointment. BI-RADS CATEGORY  5: Highly suggestive of malignancy. Patient will be scheduled for biopsy here at the [facility]. Electronically Signed   By: Marin Olp M.D.   On: 09/06/2018 16:41   Mm 3d Screen Breast Bilateral  Result Date: 08/24/2018 CLINICAL DATA:  Screening. EXAM: DIGITAL SCREENING BILATERAL MAMMOGRAM WITH TOMO AND CAD COMPARISON:  Previous exam(s). ACR Breast Density Category b: There are scattered areas of fibroglandular density. FINDINGS: In the right breast, possible distortion warrants further evaluation. In the left breast, no findings suspicious for malignancy. Images were processed with CAD. IMPRESSION: Further evaluation is suggested for possible distortion in the right breast. RECOMMENDATION: Diagnostic mammogram and  possibly ultrasound of the right breast. (Code:[id number]) The patient will be contacted regarding the findings, and additional imaging will be scheduled. BI-RADS CATEGORY  0: Incomplete. Need additional imaging evaluation and/or prior mammograms for comparison. Electronically Signed   By: Kristopher Oppenheim M.D.   On: 08/24/2018 16:47   Mm Clip Placement Right  Addendum Date: 09/12/2018   ADDENDUM REPORT: 09/12/2018 13:50 ADDENDUM: Note that during the biopsy the patient did report having taken anxiolytic medication to ease her nerves for the procedure. The patient did drive herself to the procedure and she was therefore asked to obtain a ride from a friend so that she would not drive home impaired. The patient states she was able to contact a friend to pick her up so that she would not have to drive herself. She was escorted to the waiting room to wait for her ride. Electronically  Signed   By: Everlean Alstrom M.D.   On: 09/12/2018 13:50   Result Date: 09/12/2018 CLINICAL DATA:  Post ultrasound-guided biopsy of a mass in the right breast at the 10 o'clock position. EXAM: DIAGNOSTIC RIGHT MAMMOGRAM POST ULTRASOUND BIOPSY COMPARISON:  Previous exam(s). FINDINGS: Mammographic images were obtained following ultrasound guided biopsy of a mass in the right breast at the 10 o'clock position. A Venus biopsy marking clip is present at the site of the biopsied mass in the right breast at the 10 o'clock position. IMPRESSION: Venus biopsy marking clip at site of biopsied mass in the right breast at the 10 o'clock position. Final Assessment: Post Procedure Mammograms for Marker Placement Electronically Signed: By: Everlean Alstrom M.D. On: 09/12/2018 13:25   [place] Rt Breast Bx W Loc Dev 1st Lesion Img Bx Spec US Guide  Addendum Date: 09/19/2018   ADDENDUM REPORT: 09/19/2018 10:19 ADDENDUM: The patient was contacted by Tanya Nones, RN, nurse navigator for [facility]. An appointment was made with Dr.  Peyton Najjar on 09/19/2018 and with Dr. Rogue Bussing on 09/20/2018. The patient has been notified of the appointments. Addendum by Jetta Lout, RRA on 09/19/2018. Electronically Signed   By: Everlean Alstrom M.D.   On: 09/19/2018 10:19   Addendum Date: 09/14/2018   ADDENDUM REPORT: 09/14/2018 16:51 ADDENDUM: Pathology of the right breast biopsy revealed A. BREAST, RIGHT, 10:00, 6 CM FROM NIPPLE; ULTRASOUND-GUIDED BIOPSY: INVASIVE MAMMARY CARCINOMA, WITH LOBULAR FEATURES. Size of invasive carcinoma: 6 mm in this sample. Histologic grade of invasive carcinoma: Grade 1. Ductal carcinoma in situ: Not identified. Lymphovascular invasion: Not identified. ER/PR/HER2: Immunohistochemistry will be performed on block A1, with reflex to Sigel for HER2 2+. The results will be reported in an addendum. Comment: The definitive grade will be assigned on the excisional specimen. These findings were communicated to Ermalene Searing in Dr. Johnston Ebbs office on September 13, 2018. Read back procedure was performed. This was found to be concordant by Dr. Shelly Bombard. Recommendation: Surgical and oncology referrals. Also recommend a bilateral breast MRI without and with contrast due to the lobular features. At the patient's request, results and recommendations were relayed to the patient by phone by Jetta Lout, [place] on 09/14/2018. The patient stated she did well following the biopsy with only a very small amount of bleeding but no bruising or pain. Post biopsy instructions were reviewed with the patient and all of her questions were answered. The nurse navigators for [facility] will contact the patient regarding the referrals. The patient asked that the oncology referral be made with Dr. Rogue Bussing. This will be relayed to the navigators. The patient was encouraged to contact the [facility] or [place], [place] with any further questions or concerns. Addendum by Jetta Lout, [place] on 09/14/2018. Electronically Signed   By:  Everlean Alstrom M.D.   On: 09/14/2018 16:51   Result Date: 09/19/2018 CLINICAL DATA:  48 year old female with a suspicious mass in the right breast at the 10 o'clock position. EXAM: ULTRASOUND GUIDED RIGHT BREAST CORE NEEDLE BIOPSY COMPARISON:  Previous exam(s). FINDINGS: I met with the patient and we discussed the procedure of ultrasound-guided biopsy, including benefits and alternatives. We discussed the high likelihood of a successful procedure. We discussed the risks of the procedure, including infection, bleeding, tissue injury, clip migration, and inadequate sampling. Informed written consent was given. The usual time-out protocol was performed immediately prior to the procedure. Lesion quadrant: Upper-outer Using sterile technique and 1% Lidocaine as local anesthetic, under  direct ultrasound visualization, a 12 gauge spring-loaded device was used to perform biopsy of the mass in the right breast at the 10 o'clock position using a lateral to medial approach. At the conclusion of the procedure a Venus tissue marker clip was deployed into the biopsy cavity. Follow up 2 view mammogram was performed and dictated separately. IMPRESSION: Ultrasound guided biopsy of the mass in the right breast at the 10 o'clock position. No apparent complications. Electronically Signed: By: Everlean Alstrom M.D. On: 09/12/2018 13:20    ASSESSMENT & PLAN:   Carcinoma of upper-outer quadrant of right breast in female, estrogen receptor positive ([place]) #Clinical stage I right breast cancer; ER PR positive HER-2/neu negative.  # I had a long discussion with the patient in general regarding the treatment options of breast cancer including-surgery; adjuvant radiation; role of adjuvant systemic therapy including-chemotherapy antihormone therapy.   Patient will likely need lumpectomy with sentinel lymph node evaluation; followed by radiation.  Patient has met with surgery; surgery planned on February 19.  Chemotherapy less  likely; however final recommendations based on molecular testing [Oncotype/] after surgery.  Patient will benefit from antihormone therapy.  I discussed the potential benefits of each option; and also potential downsides in detail.  # dysuria-check urine analysis/culture.  # genetics-age less than 53; no significant family history.  Will check with genetic counseling if patient qualifies for testing.  #Discussed clinical trial participation/blood draw.  Jenny Reichmann to talk to patient.  Thank you for allowing me to participate in the care of your pleasant patient. Please do not hesitate to contact me with questions or concerns in the interim.  Discussed with Sheena breast navigator.   # DISPOSITION: # follow up week of march 1st week-MD/ no labs- dr.b  All questions were answered. The patient knows to call the clinic with any problems, questions or concerns.    Cammie Sickle, MD 09/20/2018 7:44 PM

## 2018-09-20 NOTE — Assessment & Plan Note (Addendum)
#  Clinical stage I right breast cancer; ER PR positive HER-2/neu negative.  # I had a long discussion with the patient in general regarding the treatment options of breast cancer including-surgery; adjuvant radiation; role of adjuvant systemic therapy including-chemotherapy antihormone therapy.   Patient will likely need lumpectomy with sentinel lymph node evaluation; followed by radiation.  Patient has met with surgery; surgery planned on February 19.  Chemotherapy less likely; however final recommendations based on molecular testing [Oncotype/] after surgery.  Patient will benefit from antihormone therapy.  I discussed the potential benefits of each option; and also potential downsides in detail.  # dysuria-check urine analysis/culture.  # genetics-age less than 63; no significant family history.  Will check with genetic counseling if patient qualifies for testing.  #Discussed clinical trial participation/blood draw.  Jenny Reichmann to talk to patient.  Thank you for allowing me to participate in the care of your pleasant patient. Please do not hesitate to contact me with questions or concerns in the interim.  Discussed with Sheena breast navigator.   # DISPOSITION: # follow up week of march 1st week-MD/ no labs- dr.b

## 2018-09-20 NOTE — Telephone Encounter (Signed)
Please schedule the following # DISPOSITION: # follow up week of march 1st week-MD/ no labs- dr.b

## 2018-09-21 ENCOUNTER — Telehealth: Payer: Self-pay | Admitting: *Deleted

## 2018-09-21 LAB — URINE CULTURE: Culture: NO GROWTH

## 2018-09-21 NOTE — Telephone Encounter (Signed)
Penny Chase, please schedule thanks.

## 2018-09-21 NOTE — Telephone Encounter (Signed)
Pt contacted Nira Conn, RN regarding her u/a results. I spoke with patient. Results provided. Explained to patient that her u/a was negative for any signs of infection; however, blood was present in her urine. She stated that she is presently on her menstrual cycle. Final cultures are pending at this time. I will contact patient if culture demonstrates any signs of infection.

## 2018-09-21 NOTE — Progress Notes (Signed)
  Oncology Nurse Navigator Documentation  Navigator Location: [facility]Med Onc (09/21/18 0800) Referral date to RadOnc/MedOnc: 09/20/18 (09/21/18 0800) )Navigator Encounter Type: Initial MedOnc (09/21/18 0800)                         Barriers/Navigation Needs: Education (09/21/18 0800) Education: Newly Diagnosed Cancer Education (09/21/18 0800) Interventions: Education (09/21/18 0800)     Education Method: Verbal;Written (09/21/18 0800)  Support Groups/Services: Breast Support Group (09/21/18 0800)             Time Spent with Patient: 53 (09/21/18 0800)   Met patient during her initial medical oncology consult with Dr. Rogue Bussing.  Gave patient breast cancer educational literature, "My Breast Cancer Treatment Handbook" by Josephine Igo, RN.  She is to call if she has any questions or needs.

## 2018-09-25 ENCOUNTER — Encounter: Payer: Self-pay | Admitting: *Deleted

## 2018-09-25 ENCOUNTER — Other Ambulatory Visit: Payer: Self-pay

## 2018-09-25 ENCOUNTER — Encounter
Admission: RE | Admit: 2018-09-25 | Discharge: 2018-09-25 | Disposition: A | Discharge status: Home / Self Care | Payer: BLUE CROSS/BLUE SHIELD | Source: Ambulatory Visit | Attending: General Surgery | Admitting: General Surgery

## 2018-09-25 DIAGNOSIS — C50411 Malignant neoplasm of upper-outer quadrant of right female breast: Secondary | ICD-10-CM

## 2018-09-25 DIAGNOSIS — Z170 Estrogen receptor positive status [ER+]: Principal | ICD-10-CM

## 2018-09-25 NOTE — Research (Signed)
I contacted Ms. Mailloux today about the [employer] study.  She was interested and was going to sign consent in the am and have blood collected. She had been given a copy of the ICF for this study on Friday, 09/22/2018. After contacting Malachi Bonds later in the day, we were informed that the breast portion of this study had been closed.  She had neglected to inform the [facility] site of this, so I had to call Ms. Andes back and cancel her visit for tomorrow.  I told her there might be other studies that she would qualify for in the future and we would contact her.  I thanked Ms. Innocent for considering the study and apologized for the misinformation and inconvenience. Raynelle Dick, RN BSN "09/25/2018 3:26 PM"

## 2018-09-25 NOTE — Patient Instructions (Signed)
Your procedure is scheduled on:10/04/18 0800 AM Report to Mammography. [place].  Remember: Instructions that are not followed completely may result in serious medical risk,  up to and including death, or upon the discretion of your surgeon and anesthesiologist your  surgery may need to be rescheduled.     _X__ 1. Do not eat food after midnight the night before your procedure.                 No gum chewing or hard candies. You may drink clear liquids up to 2 hours                 before you are scheduled to arrive for your surgery- DO not drink clear                 liquids within 2 hours of the start of your surgery.                 Clear Liquids include:  water, apple juice without pulp, clear carbohydrate                 drink such as Clearfast of Gatorade, Black Coffee or Tea (Do not add                 anything to coffee or tea).  __X__2.  On the morning of surgery brush your teeth with toothpaste and water, you                may rinse your mouth with mouthwash if you wish.  Do not swallow any toothpaste of mouthwash.     _X__ 3.  No Alcohol for 24 hours before or after surgery.   _X__ 4.  Do Not Smoke or use e-cigarettes For 24 Hours Prior to Your Surgery.                 Do not use any chewable tobacco products for at least 6 hours prior to                 surgery.  ____  5.  Bring all medications with you on the day of surgery if instructed.   ____  6.  Notify your doctor if there is any change in your medical condition      (cold, fever, infections).     Do not wear jewelry, make-up, hairpins, clips or nail polish. Do not wear lotions, powders, or perfumes. You may wear deodorant. Do not shave 48 hours prior to surgery. Men may shave face and neck. Do not bring valuables to the hospital.    [facility] is not responsible for any belongings or valuables.  Contacts, dentures or bridgework may not be worn into surgery. Leave your  suitcase in the car. After surgery it may be brought to your room. For patients admitted to the hospital, discharge time is determined by your treatment team.   Patients discharged the day of surgery will not be allowed to drive home.   Please read over the following fact sheets that you were given:  INFECTION PREVENTION/ CHG     __X__ Take these medicines the morning of surgery with A SIP OF WATER:    1. PANTOPRAZOLE AT BEDTIME AND MORNING OF SURGERY  2.   3.   4.  5.  6.  ____ Fleet Enema (as directed)   __X__ Use CHG Soap as directed  ____ Use inhalers on the day of surgery  ____ Stop  metformin 2 days prior to surgery    ____ Take 1/2 of usual insulin dose the night before surgery. No insulin the morning          of surgery.   ____ Stop Coumadin/Plavix/aspirin on  __X__ Stop Anti-inflammatories on      NO ALKASELTZER WITH ASPIRIN UNTIL AFTER SURGERY   ____ Stop supplements until after surgery.    ____ Bring C-Pap to the hospital.

## 2018-09-26 ENCOUNTER — Inpatient Hospital Stay: Admission: RE | Admit: 2018-09-26 | Payer: BLUE CROSS/BLUE SHIELD | Source: Ambulatory Visit

## 2018-09-27 ENCOUNTER — Other Ambulatory Visit: Payer: Self-pay

## 2018-09-27 ENCOUNTER — Encounter
Admission: RE | Admit: 2018-09-27 | Discharge: 2018-09-27 | Disposition: A | Discharge status: Home / Self Care | Payer: BLUE CROSS/BLUE SHIELD | Source: Ambulatory Visit | Attending: General Surgery | Admitting: General Surgery

## 2018-09-27 DIAGNOSIS — I10 Essential (primary) hypertension: Secondary | ICD-10-CM | POA: Insufficient documentation

## 2018-09-27 DIAGNOSIS — Z01818 Encounter for other preprocedural examination: Secondary | ICD-10-CM | POA: Diagnosis present

## 2018-09-29 ENCOUNTER — Ambulatory Visit
Admission: RE | Admit: 2018-09-29 | Discharge: 2018-09-29 | Disposition: A | Discharge status: Home / Self Care | Payer: BLUE CROSS/BLUE SHIELD | Source: Ambulatory Visit | Attending: General Surgery | Admitting: General Surgery

## 2018-09-29 DIAGNOSIS — Z170 Estrogen receptor positive status [ER+]: Secondary | ICD-10-CM | POA: Diagnosis present

## 2018-09-29 DIAGNOSIS — C50411 Malignant neoplasm of upper-outer quadrant of right female breast: Secondary | ICD-10-CM

## 2018-09-29 MED ORDER — GADOBUTROL 1 MMOL/ML IV SOLN
7.0000 mL | Freq: Once | INTRAVENOUS | Status: AC | PRN
  Administered 2018-09-29: 7 mL via INTRAVENOUS

## 2018-10-03 MED ORDER — CEFAZOLIN SODIUM-DEXTROSE 2-4 GM/100ML-% IV SOLN
2.0000 g | INTRAVENOUS | Status: AC
  Administered 2018-10-04: 2 g via INTRAVENOUS

## 2018-10-04 ENCOUNTER — Encounter
Admission: RE | Disposition: A | Discharge status: Home / Self Care | Payer: Self-pay | Source: Home / Self Care | Attending: General Surgery

## 2018-10-04 ENCOUNTER — Ambulatory Visit
Admission: RE | Admit: 2018-10-04 | Discharge: 2018-10-04 | Disposition: A | Discharge status: Home / Self Care | Payer: BLUE CROSS/BLUE SHIELD | Source: Ambulatory Visit | Attending: General Surgery | Admitting: General Surgery

## 2018-10-04 ENCOUNTER — Ambulatory Visit
Admission: RE | Admit: 2018-10-04 | Discharge: 2018-10-04 | Disposition: A | Discharge status: Home / Self Care | Payer: BLUE CROSS/BLUE SHIELD | Attending: General Surgery | Admitting: General Surgery

## 2018-10-04 ENCOUNTER — Ambulatory Visit: Payer: BLUE CROSS/BLUE SHIELD | Admitting: Anesthesiology

## 2018-10-04 ENCOUNTER — Encounter: Payer: Self-pay | Admitting: Anesthesiology

## 2018-10-04 ENCOUNTER — Encounter
Admission: RE | Admit: 2018-10-04 | Discharge: 2018-10-04 | Disposition: A | Discharge status: Home / Self Care | Payer: BLUE CROSS/BLUE SHIELD | Source: Ambulatory Visit | Attending: General Surgery | Admitting: General Surgery

## 2018-10-04 ENCOUNTER — Other Ambulatory Visit: Payer: Self-pay

## 2018-10-04 DIAGNOSIS — C50411 Malignant neoplasm of upper-outer quadrant of right female breast: Secondary | ICD-10-CM

## 2018-10-04 DIAGNOSIS — Z170 Estrogen receptor positive status [ER+]: Principal | ICD-10-CM

## 2018-10-04 DIAGNOSIS — Z803 Family history of malignant neoplasm of breast: Secondary | ICD-10-CM | POA: Insufficient documentation

## 2018-10-04 DIAGNOSIS — I10 Essential (primary) hypertension: Secondary | ICD-10-CM | POA: Diagnosis not present

## 2018-10-04 DIAGNOSIS — K219 Gastro-esophageal reflux disease without esophagitis: Secondary | ICD-10-CM | POA: Insufficient documentation

## 2018-10-04 DIAGNOSIS — Z79899 Other long term (current) drug therapy: Secondary | ICD-10-CM | POA: Insufficient documentation

## 2018-10-04 HISTORY — PX: BREAST LUMPECTOMY: SHX2

## 2018-10-04 HISTORY — PX: BREAST LUMPECTOMY WITH NEEDLE LOCALIZATION AND AXILLARY SENTINEL LYMPH NODE BX: SHX5760

## 2018-10-04 LAB — POCT PREGNANCY, URINE: PREG TEST UR: NEGATIVE

## 2018-10-04 SURGERY — BREAST LUMPECTOMY WITH NEEDLE LOCALIZATION AND AXILLARY SENTINEL LYMPH NODE BX
Anesthesia: General | Laterality: Right

## 2018-10-04 MED ORDER — BUPIVACAINE HCL (PF) 0.5 % IJ SOLN
INTRAMUSCULAR | Status: AC
  Filled 2018-10-04: qty 30

## 2018-10-04 MED ORDER — SODIUM CHLORIDE FLUSH 0.9 % IV SOLN
INTRAVENOUS | Status: AC
  Filled 2018-10-04: qty 10

## 2018-10-04 MED ORDER — HYDROCODONE-ACETAMINOPHEN 5-325 MG PO TABS
ORAL_TABLET | ORAL | Status: AC
  Administered 2018-10-04: 1 via ORAL
  Filled 2018-10-04: qty 1

## 2018-10-04 MED ORDER — FENTANYL CITRATE (PF) 100 MCG/2ML IJ SOLN
25.0000 ug | INTRAMUSCULAR | Status: DC | PRN
  Administered 2018-10-04 (×3): 25 ug via INTRAVENOUS

## 2018-10-04 MED ORDER — HYDROCODONE-ACETAMINOPHEN 5-325 MG PO TABS: 1.0000 | ORAL_TABLET | ORAL | 0 refills | Status: AC | PRN

## 2018-10-04 MED ORDER — BUPIVACAINE-EPINEPHRINE (PF) 0.25% -1:200000 IJ SOLN
INTRAMUSCULAR | Status: DC | PRN
  Administered 2018-10-04: 20 mL

## 2018-10-04 MED ORDER — ACETAMINOPHEN 10 MG/ML IV SOLN
INTRAVENOUS | Status: DC | PRN
  Administered 2018-10-04: 1000 mg via INTRAVENOUS

## 2018-10-04 MED ORDER — PROPOFOL 10 MG/ML IV BOLUS
INTRAVENOUS | Status: DC | PRN
  Administered 2018-10-04: 150 mg via INTRAVENOUS

## 2018-10-04 MED ORDER — LACTATED RINGERS IV SOLN
INTRAVENOUS | Status: DC
  Administered 2018-10-04: 09:00:00 via INTRAVENOUS

## 2018-10-04 MED ORDER — PROPOFOL 10 MG/ML IV BOLUS
INTRAVENOUS | Status: AC
  Filled 2018-10-04: qty 40

## 2018-10-04 MED ORDER — DEXAMETHASONE SODIUM PHOSPHATE 10 MG/ML IJ SOLN
INTRAMUSCULAR | Status: DC | PRN
  Administered 2018-10-04: 4 mg via INTRAVENOUS

## 2018-10-04 MED ORDER — FENTANYL CITRATE (PF) 100 MCG/2ML IJ SOLN
INTRAMUSCULAR | Status: AC
  Filled 2018-10-04: qty 2

## 2018-10-04 MED ORDER — HYDROCODONE-ACETAMINOPHEN 5-325 MG PO TABS
1.0000 | ORAL_TABLET | Freq: Once | ORAL | Status: AC
  Administered 2018-10-04: 1 via ORAL

## 2018-10-04 MED ORDER — ONDANSETRON HCL 4 MG/2ML IJ SOLN: 4.0000 mg | Freq: Once | INTRAMUSCULAR | Status: DC | PRN

## 2018-10-04 MED ORDER — EPINEPHRINE PF 1 MG/ML IJ SOLN
INTRAMUSCULAR | Status: AC
  Filled 2018-10-04: qty 1

## 2018-10-04 MED ORDER — TECHNETIUM TC 99M SULFUR COLLOID FILTERED
1.0000 | Freq: Once | INTRAVENOUS | Status: AC | PRN
  Administered 2018-10-04: 0.658 via INTRADERMAL

## 2018-10-04 MED ORDER — CEFAZOLIN SODIUM-DEXTROSE 2-4 GM/100ML-% IV SOLN
INTRAVENOUS | Status: AC
  Filled 2018-10-04: qty 100

## 2018-10-04 MED ORDER — FENTANYL CITRATE (PF) 100 MCG/2ML IJ SOLN
INTRAMUSCULAR | Status: AC
  Administered 2018-10-04: 25 ug
  Filled 2018-10-04: qty 2

## 2018-10-04 MED ORDER — ACETAMINOPHEN 10 MG/ML IV SOLN
INTRAVENOUS | Status: AC
  Filled 2018-10-04: qty 100

## 2018-10-04 MED ORDER — LIDOCAINE HCL (CARDIAC) PF 100 MG/5ML IV SOSY
PREFILLED_SYRINGE | INTRAVENOUS | Status: DC | PRN
  Administered 2018-10-04: 80 mg via INTRAVENOUS

## 2018-10-04 MED ORDER — ONDANSETRON HCL 4 MG/2ML IJ SOLN
INTRAMUSCULAR | Status: DC | PRN
  Administered 2018-10-04: 4 mg via INTRAVENOUS

## 2018-10-04 MED ORDER — MIDAZOLAM HCL 2 MG/2ML IJ SOLN
INTRAMUSCULAR | Status: AC
  Filled 2018-10-04: qty 2

## 2018-10-04 MED ORDER — MIDAZOLAM HCL 2 MG/2ML IJ SOLN
INTRAMUSCULAR | Status: DC | PRN
  Administered 2018-10-04: 2 mg via INTRAVENOUS

## 2018-10-04 MED ORDER — FENTANYL CITRATE (PF) 100 MCG/2ML IJ SOLN
INTRAMUSCULAR | Status: DC | PRN
  Administered 2018-10-04: 50 ug via INTRAVENOUS
  Administered 2018-10-04 (×2): 25 ug via INTRAVENOUS
  Administered 2018-10-04: 75 ug via INTRAVENOUS
  Administered 2018-10-04 (×2): 50 ug via INTRAVENOUS
  Administered 2018-10-04: 25 ug via INTRAVENOUS

## 2018-10-04 SURGICAL SUPPLY — 52 items
BINDER BREAST LRG (GAUZE/BANDAGES/DRESSINGS) IMPLANT
BINDER BREAST MEDIUM (GAUZE/BANDAGES/DRESSINGS) IMPLANT
BINDER BREAST XLRG (GAUZE/BANDAGES/DRESSINGS) IMPLANT
BINDER BREAST XXLRG (GAUZE/BANDAGES/DRESSINGS) IMPLANT
BLADE SURG 15 STRL LF DISP TIS (BLADE) ×1 IMPLANT
BLADE SURG 15 STRL SS (BLADE) ×1
BLADE SURG 15 STRL SS SAFETY (BLADE) IMPLANT
CANISTER SUCT 1200ML W/VALVE (MISCELLANEOUS) ×2 IMPLANT
CHLORAPREP W/TINT 26ML (MISCELLANEOUS) ×2 IMPLANT
CNTNR SPEC 2.5X3XGRAD LEK (MISCELLANEOUS) ×1
CONT SPEC 4OZ STER OR WHT (MISCELLANEOUS) ×1
CONTAINER SPEC 2.5X3XGRAD LEK (MISCELLANEOUS) ×1 IMPLANT
COVER PROBE FLX POLY STRL (MISCELLANEOUS) IMPLANT
COVER WAND RF STERILE (DRAPES) IMPLANT
DERMABOND ADVANCED (GAUZE/BANDAGES/DRESSINGS) ×1
DERMABOND ADVANCED .7 DNX12 (GAUZE/BANDAGES/DRESSINGS) ×1 IMPLANT
DEVICE DUBIN SPECIMEN MAMMOGRA (MISCELLANEOUS) ×2 IMPLANT
DRAPE LAPAROTOMY TRNSV 106X77 (MISCELLANEOUS) ×2 IMPLANT
DRSG TELFA 3X8 NADH (GAUZE/BANDAGES/DRESSINGS) IMPLANT
ELECT CAUTERY BLADE TIP 2.5 (TIP)
ELECT REM PT RETURN 9FT ADLT (ELECTROSURGICAL) ×2
ELECTRODE CAUTERY BLDE TIP 2.5 (TIP) IMPLANT
ELECTRODE REM PT RTRN 9FT ADLT (ELECTROSURGICAL) ×1 IMPLANT
GAUZE SPONGE 4X4 12PLY STRL (GAUZE/BANDAGES/DRESSINGS) IMPLANT
GLOVE BIO SURGEON STRL SZ 6.5 (GLOVE) ×6 IMPLANT
GOWN STRL REUS W/ TWL LRG LVL3 (GOWN DISPOSABLE) ×3 IMPLANT
GOWN STRL REUS W/TWL LRG LVL3 (GOWN DISPOSABLE) ×3
KIT TURNOVER KIT A (KITS) ×2 IMPLANT
LABEL OR SOLS (LABEL) IMPLANT
MARGIN MAP 10MM (MISCELLANEOUS) ×2 IMPLANT
NEEDLE HYPO 22GX1.5 SAFETY (NEEDLE) IMPLANT
NEEDLE HYPO 25X1 1.5 SAFETY (NEEDLE) ×2 IMPLANT
PACK BASIN MINOR ARMC (MISCELLANEOUS) ×2 IMPLANT
RETRACTOR RING XSMALL (MISCELLANEOUS) ×1 IMPLANT
RTRCTR WOUND ALEXIS 13CM XS SH (MISCELLANEOUS) ×2
SLEVE PROBE SENORX GAMMA FIND (MISCELLANEOUS) ×2 IMPLANT
SUT ETHILON 3-0 FS-10 30 BLK (SUTURE)
SUT PROLENE 2 0 FS (SUTURE) ×2 IMPLANT
SUT SILK 2 0 (SUTURE)
SUT SILK 2 0 SH (SUTURE) ×2 IMPLANT
SUT SILK 2-0 18XBRD TIE 12 (SUTURE) IMPLANT
SUT VIC AB 2-0 CT1 27 (SUTURE) ×2
SUT VIC AB 2-0 CT1 TAPERPNT 27 (SUTURE) ×2 IMPLANT
SUT VIC AB 3-0 SH 27 (SUTURE) ×1
SUT VIC AB 3-0 SH 27X BRD (SUTURE) ×1 IMPLANT
SUT VIC AB 4-0 FS2 27 (SUTURE) ×4 IMPLANT
SUT VICRYL+ 3-0 144IN (SUTURE) ×2 IMPLANT
SUTURE EHLN 3-0 FS-10 30 BLK (SUTURE) IMPLANT
SYR 10ML LL (SYRINGE) ×2 IMPLANT
SYR BULB IRRIG 60ML STRL (SYRINGE) IMPLANT
SYR CONTROL 10ML (SYRINGE) IMPLANT
WATER STERILE IRR 1000ML POUR (IV SOLUTION) ×2 IMPLANT

## 2018-10-04 NOTE — Discharge Instructions (Signed)
°  Diet: Resume home heart healthy regular diet.   Activity: No heavy lifting >20 pounds (children, pets, laundry, garbage) or strenuous activity until follow-up, but light activity and walking are encouraged. Do not drive or drink alcohol if taking narcotic pain medications. Perform right upper extremity stretches exercises as tolerated.  Wound care: May shower with soapy water and pat dry (do not rub incisions), but no baths or submerging incision underwater until follow-up. (no swimming)   Medications: Resume all home medications. For mild to moderate pain: acetaminophen (Tylenol) or ibuprofen (if no kidney disease). Combining Tylenol with alcohol can substantially increase your risk of causing liver disease. Narcotic pain medications, if prescribed, can be used for severe pain, though may cause nausea, constipation, and drowsiness. Do not combine Tylenol and Norco within a 6 hour period as Norco contains Tylenol. If you do not need the narcotic pain medication, you do not need to fill the prescription.  AMBULATORY SURGERY  DISCHARGE INSTRUCTIONS   1) The drugs that you were given will stay in your system until tomorrow so for the next 24 hours you should not:  A) Drive an automobile B) Make any legal decisions C) Drink any alcoholic beverage   2) You may resume regular meals tomorrow.  Today it is better to start with liquids and gradually work up to solid foods.  You may eat anything you prefer, but it is better to start with liquids, then soup and crackers, and gradually work up to solid foods.   3) Please notify your doctor immediately if you have any unusual bleeding, trouble breathing, redness and pain at the surgery site, drainage, fever, or pain not relieved by medication.    4) Additional Instructions:        Please contact your physician with any problems or Same Day Surgery at [phone], Monday through Friday 6 am to 4 pm, or [place] at [facility] number at  [phone].  May use ice packs on the area of pain or swelling.  May use a chest binder or supportive bra for comfort.  Call office [phone]) at any time if any questions, worsening pain, fevers/chills, bleeding, drainage from incision site, or other concerns.

## 2018-10-04 NOTE — Anesthesia Preprocedure Evaluation (Addendum)
Anesthesia Evaluation  Patient identified by MRN, date of birth, ID band Patient awake    Reviewed: Allergy & Precautions, NPO status , Patient's Chart, lab work & pertinent test results, reviewed documented beta blocker date and time   Airway Mallampati: II  TM Distance: >3 FB Neck ROM: Full    Dental no notable dental hx.    Pulmonary neg sleep apnea, neg COPD, Current Smoker,    breath sounds clear to auscultation- rhonchi (-) wheezing      Cardiovascular hypertension, Pt. on medications (-) CAD, (-) Past MI, (-) Cardiac Stents and (-) CABG  Rhythm:Regular Rate:Normal - Systolic murmurs and - Diastolic murmurs    Neuro/Psych neg Seizures negative neurological ROS  negative psych ROS   GI/Hepatic negative GI ROS, Neg liver ROS,   Endo/Other  negative endocrine ROSneg diabetes  Renal/GU negative Renal ROS     Musculoskeletal negative musculoskeletal ROS (+)   Abdominal (+) - obese,   Peds  Hematology negative hematology ROS (+)   Anesthesia Other Findings Past Medical History: No date: Hypertension   Reproductive/Obstetrics                            Anesthesia Physical Anesthesia Plan  ASA: II  Anesthesia Plan: General   Post-op Pain Management:    Induction: Intravenous  PONV Risk Score and Plan: 1 and Ondansetron and Midazolam  Airway Management Planned: LMA  Additional Equipment:   Intra-op Plan:   Post-operative Plan:   Informed Consent: I have reviewed the patients History and Physical, chart, labs and discussed the procedure including the risks, benefits and alternatives for the proposed anesthesia with the patient or authorized representative who has indicated his/her understanding and acceptance.     Dental advisory given  Plan Discussed with: CRNA  Anesthesia Plan Comments:        Anesthesia Quick Evaluation

## 2018-10-04 NOTE — Transfer of Care (Addendum)
Immediate Anesthesia Transfer of Care Note  Patient: Penny Chase  Procedure(s) Performed: BREAST LUMPECTOMY WITH NEEDLE LOCALIZATION AND AXILLARY SENTINEL LYMPH NODE BX (Right )  Patient Location: PACU  Anesthesia Type:General  Level of Consciousness: awake, alert  and oriented  Airway & Oxygen Therapy: Patient Spontanous Breathing and Patient connected to face mask oxygen  Post-op Assessment: Report given to RN and Post -op Vital signs reviewed and stable  Post vital signs: Reviewed and stable  Last Vitals:  Vitals Value Taken Time  BP    Temp    Pulse    Resp    SpO2      Last Pain:  Vitals:   10/04/18 0916  TempSrc: Oral  PainSc: 5          Complications: No apparent anesthesia complications

## 2018-10-04 NOTE — Interval H&P Note (Signed)
History and Physical Interval Note:  10/04/2018 12:28 PM  Penny Chase  has presented today for surgery, with the diagnosis of Malignant neoplasm of upper-outer quadrant fo right breast  The various methods of treatment have been discussed with the patient and family. After consideration of risks, benefits and other options for treatment, the patient has consented to  Procedure(s): BREAST LUMPECTOMY WITH NEEDLE LOCALIZATION AND AXILLARY SENTINEL LYMPH NODE BX (Right) as a surgical intervention .  The patient's history has been reviewed, patient examined, no change in status, stable for surgery.  I have reviewed the patient's chart and labs.  Right sided marked in the pre procedure room. Questions were answered to the patient's satisfaction.     Herbert Pun

## 2018-10-04 NOTE — Anesthesia Procedure Notes (Signed)
Procedure Name: LMA Insertion Date/Time: 10/04/2018 1:00 PM Performed by: Chanetta Marshall, CRNA Pre-anesthesia Checklist: Patient identified, Emergency Drugs available, Suction available and Patient being monitored Patient Re-evaluated:Patient Re-evaluated prior to induction Oxygen Delivery Method: Circle system utilized Preoxygenation: Pre-oxygenation with 100% oxygen Induction Type: IV induction Ventilation: Mask ventilation without difficulty LMA: LMA inserted LMA Size: 4.0 Number of attempts: 1 Tube secured with: Tape Dental Injury: Teeth and Oropharynx as per pre-operative assessment

## 2018-10-04 NOTE — Anesthesia Post-op Follow-up Note (Signed)
Anesthesia QCDR form completed.        

## 2018-10-04 NOTE — Op Note (Signed)
Preoperative diagnosis: Right breast carcinoma.  Postoperative diagnosis: Right breast carcinoma.   Procedure: Right needle-localized partial mastectomy.                       Right Axillary Sentinel Lymph node biopsy  Anesthesia: GETA  Surgeon: Dr. Windell Moment  Wound Classification: Clean  Indications: Patient is a 48 y.o. female with a nonpalpable right breast mass noted on mammography with core biopsy demonstrating invasive mammary carcinoma with lobular features requires needle-localized partial mastectomy for treatment with sentinel lymph node biopsy.   Findings: 1. Specimen mammography shows marker and wire on specimen 2. Pathology call refers gross examination of margins was close the anterior margin. 3. No other palpable mass or lymph node identified.  4.  Adequate hemostasis  Description of procedure: Preoperative needle localization was performed by radiology. In the nuclear medicine suite, the subareolar region was injected with Tc-99 sulfur colloid. Localization studies were reviewed. The patient was taken to the operating room and placed supine on the operating table, and after general anesthesia the right chest and axilla were prepped and draped in the usual sterile fashion. A time-out was completed verifying correct patient, procedure, site, positioning, and implant(s) and/or special equipment prior to beginning this procedure.  By comparing the localization studies with the direction and skin entry site of the needle, the probable trajectory and location of the mass was visualized. A circumareolar skin incision was planned in such a way as to minimize the amount of dissection to reach the mass.  The skin incision was made. Flaps were raised and the location of the wire confirmed. The wire was delivered into the wound. A 2-0 silk figure-of-eight stay suture was placed around the wire and used for retraction. Dissection was then taken down circumferentially, taking care to  include the entire localizing needle and a wide margin of grossly normal tissue. The specimen and entire localizing wire were removed. The specimen was oriented and sent to radiology with the localization studies. Confirmation was received that the entire target lesion had been resected.  Pathology called back and refers that the anterior margin was close.  A reexcision of the anterior margin was done and oriented and sent to pathology for routine specimen.   A hand-held gamma probe was used to identify the location of the hottest spot in the axilla.  Spot was able to be reached through the same incision.  Dissection was carried down until axillary subdermal facias was advanced. The probe was placed and again, the point of maximal count was found. Dissection continue until nodule was identified. Ex vivo, the node measured 1620 counts when placed on the probe. An additional hot spot was detected and the node was excised in similar fashion. The following counts registered: 488 ex vivo node, and 2 residual bed. No additional hot spots were identified. No clinically abnormal nodes were palpated. The procedure was terminated.  The axillary subdermal fascia was closed with Vicryl. The wound was irrigated. Hemostasis was checked. The wound was closed with interrupted sutures of 3-0 Vicryl and a subcuticular suture of Monocryl 4-0. No attempt was made to close the dead space.  Dermabond was applied.   The patient tolerated the procedure well and was taken to the postanesthesia care unit in stable condition.   Specimen: Right Breast mass (Orientation markers used: Cranial, Caudal, Lateral,Deep)                   Sentinel Lymph nodes #1 and #  2                   Re-excision of anterior margin (short superior, long lateral, black suture inside cavity.   Complications: None  Estimated Blood Loss: 10 mL

## 2018-10-05 ENCOUNTER — Encounter: Payer: Self-pay | Admitting: General Surgery

## 2018-10-06 LAB — SURGICAL PATHOLOGY

## 2018-10-18 ENCOUNTER — Ambulatory Visit: Payer: BLUE CROSS/BLUE SHIELD | Admitting: Internal Medicine

## 2018-10-20 ENCOUNTER — Inpatient Hospital Stay: Payer: BLUE CROSS/BLUE SHIELD | Admitting: Internal Medicine

## 2018-10-24 NOTE — Anesthesia Postprocedure Evaluation (Signed)
Anesthesia Post Note  Patient: Penny Chase  Procedure(s) Performed: BREAST LUMPECTOMY WITH NEEDLE LOCALIZATION AND AXILLARY SENTINEL LYMPH NODE BX (Right )  Patient location during evaluation: PACU Anesthesia Type: General Level of consciousness: awake and alert Pain management: pain level controlled Vital Signs Assessment: post-procedure vital signs reviewed and stable Respiratory status: spontaneous breathing, nonlabored ventilation and respiratory function stable Cardiovascular status: blood pressure returned to baseline and stable Postop Assessment: no apparent nausea or vomiting Anesthetic complications: no     Last Vitals:  Vitals:   10/04/18 1540 10/04/18 1556  BP:  (!) 154/79  Pulse:  70  Resp:  20  Temp: 36.4 C 36.9 C  SpO2:  100%    Last Pain:  Vitals:   10/04/18 1634  TempSrc:   PainSc: 6                  [name] Harvie Heck

## 2018-10-25 ENCOUNTER — Inpatient Hospital Stay: Payer: BLUE CROSS/BLUE SHIELD | Attending: Internal Medicine | Admitting: Internal Medicine

## 2018-10-25 ENCOUNTER — Other Ambulatory Visit: Payer: Self-pay

## 2018-10-25 ENCOUNTER — Encounter: Payer: Self-pay | Admitting: Internal Medicine

## 2018-10-25 VITALS — BP 125/85 | HR 76 | Temp 97.0°F | Resp 16 | Wt 172.0 lb

## 2018-10-25 DIAGNOSIS — Z170 Estrogen receptor positive status [ER+]: Secondary | ICD-10-CM

## 2018-10-25 DIAGNOSIS — C50411 Malignant neoplasm of upper-outer quadrant of right female breast: Secondary | ICD-10-CM | POA: Diagnosis not present

## 2018-10-25 DIAGNOSIS — Z720 Tobacco use: Secondary | ICD-10-CM | POA: Diagnosis not present

## 2018-10-25 NOTE — Assessment & Plan Note (Addendum)
#  Stage IA ER PR positive HER-2/neu negative.  Reviewed the pathology and surgery post surgery.  #Had a long discussion with the patient regarding Oncotype and helping to assess her risk of recurrence/assess benefit of chemotherapy.  Clinically she is less likely to have low risk Oncotype.  However given age/26 mm sized tumor I think Oncotype is reasonable.  #Discussed the mechanism of action of antihormone therapy-with blocking of estrogen to prevent breast cancer.  Also discussed the potential side effects including but not limited to arthralgias hot flashes and increased risk of osteoporosis.  # Genetics-age less than 60; no significant family history; no children.   # DISPOSITION: # Referral to Dr.Crystal ZO:XWRUEA cancer # Referral to Genetics dx; breast cancer # follow up  In 6 weeks-MD; no labs-Dr.B

## 2018-10-25 NOTE — Progress Notes (Signed)
[place] NOTE  Patient Care Team: Kirk Ruths, MD as PCP - General (Internal Medicine)  CHIEF COMPLAINTS/PURPOSE OF CONSULTATION:  Breast cancer  #  Oncology History   # JAN 2020- INVASIVE LOBULAR CARCINOMA, WITH FEATURES. pT2 (80m)psN0; (Dr.Cintron )Stage IA; ER/PR >90%; her-2 neu-NEG.  # recommend RT; oncotype   DIAGNOSIS: Right breast cancer  STAGE:     1    ;GOALS: Cure  CURRENT/MOST RECENT THERAPY:         Carcinoma of upper-outer quadrant of right breast in female, estrogen receptor positive ([place]   09/20/2018 Initial Diagnosis    Carcinoma of upper-outer quadrant of right breast in female, estrogen receptor positive ([place]      HISTORY OF PRESENTING ILLNESS:  Penny [place]89y.o.  female recently diagnosed breast cancer currently status post lumpectomy is here to discuss individuals of her pathology.  Patient is recuperating well from surgery.  Denies any postoperative pain.   Review of Systems  Constitutional: Negative for chills, diaphoresis, fever, malaise/fatigue and weight loss.  HENT: Negative for nosebleeds and sore throat.   Eyes: Negative for double vision.  Respiratory: Negative for cough, hemoptysis, sputum production, shortness of breath and wheezing.   Cardiovascular: Negative for chest pain, palpitations, orthopnea and leg swelling.  Gastrointestinal: Negative for abdominal pain, blood in stool, constipation, diarrhea, heartburn, melena, nausea and vomiting.  Musculoskeletal: Negative for back pain and joint pain.  Skin: Negative.  Negative for itching and rash.  Neurological: Negative for dizziness, tingling, focal weakness, weakness and headaches.  Endo/Heme/Allergies: Does not bruise/bleed easily.  Psychiatric/Behavioral: Negative for depression. The patient is not nervous/anxious and does not have insomnia.      MEDICAL HISTORY:  Past Medical History:  Diagnosis Date  . Hypertension     SURGICAL  HISTORY: Past Surgical History:  Procedure Laterality Date  . BREAST BIOPSY Right 09/12/2018   [facility]/"venus clip"-INVASIVE MAMMARY CARCINOMA, WITH LOBULAR FEATURES  . BREAST LUMPECTOMY Right 10/04/2018  . BREAST LUMPECTOMY WITH NEEDLE LOCALIZATION AND AXILLARY SENTINEL LYMPH NODE BX Right 10/04/2018   Procedure: BREAST LUMPECTOMY WITH NEEDLE LOCALIZATION AND AXILLARY SENTINEL LYMPH NODE BX;  Surgeon: CHerbert Pun MD;  Location: [facility] ORS;  Service: General;  Laterality: Right;  . TONSILLECTOMY      SOCIAL HISTORY: Social History   Socioeconomic History  . Marital status: Married    Spouse name: Not on file  . Number of children: Not on file  . Years of education: Not on file  . Highest education level: Not on file  Occupational History  . Not on file  Social Needs  . Financial resource strain: Not on file  . Food insecurity:    Worry: Not on file    Inability: Not on file  . Transportation needs:    Medical: Not on file    Non-medical: Not on file  Tobacco Use  . Smoking status: Current Every Day Smoker  . Smokeless tobacco: Never Used  Substance and Sexual Activity  . Alcohol use: Yes    Comment: OCCAS  . Drug use: No  . Sexual activity: Not on file  Lifestyle  . Physical activity:    Days per week: Not on file    Minutes per session: Not on file  . Stress: Not on file  Relationships  . Social connections:    Talks on phone: Not on file    Gets together: Not on file    Attends religious service: Not on file  Active member of club or organization: Not on file    Attends meetings of clubs or organizations: Not on file    Relationship status: Not on file  . Intimate partner violence:    Fear of current or ex partner: Not on file    Emotionally abused: Not on file    Physically abused: Not on file    Forced sexual activity: Not on file  Other Topics Concern  . Not on file  Social History Narrative   Works at [employer];  Smoke 15  cig/day; No alcohol abuse; in [employer]; with husband.     FAMILY HISTORY: Family History  Problem Relation Age of Onset  . Breast cancer Paternal Aunt     ALLERGIES:  has No Known Allergies.  MEDICATIONS:  Current Outpatient Medications  Medication Sig Dispense Refill  . acetaminophen (TYLENOL) 500 MG tablet Take 500 mg by mouth every 6 (six) hours as needed for moderate pain.    Marland Kitchen amLODipine (NORVASC) 5 MG tablet Take 5 mg by mouth daily.    Marland Kitchen aspirin-sod bicarb-citric acid (ALKA-SELTZER) 325 MG TBEF tablet Take 325 mg by mouth every 6 (six) hours as needed (indigestion).    . pantoprazole (PROTONIX) 40 MG tablet Take 40 mg by mouth daily as needed (heartburn).      No current facility-administered medications for this visit.       Marland Kitchen  PHYSICAL EXAMINATION: ECOG PERFORMANCE STATUS: 0 - Asymptomatic  Vitals:   10/25/18 1428  BP: 125/85  Pulse: 76  Resp: 16  Temp: (!) 97 F (36.1 C)   Filed Weights   10/25/18 1428  Weight: 172 lb (78 kg)    Physical Exam  Constitutional: She is oriented to person, place, and time and well-developed, well-nourished, and in no distress.  HENT:  Head: Normocephalic and atraumatic.  Mouth/Throat: Oropharynx is clear and moist. No oropharyngeal exudate.  Eyes: Pupils are equal, round, and reactive to light.  Neck: Normal range of motion. Neck supple.  Cardiovascular: Normal rate and regular rhythm.  Pulmonary/Chest: No respiratory distress. She has no wheezes.  Abdominal: Soft. Bowel sounds are normal. She exhibits no distension and no mass. There is no abdominal tenderness. There is no rebound and no guarding.  Musculoskeletal: Normal range of motion.        General: No tenderness or edema.  Neurological: She is alert and oriented to person, place, and time.  Skin: Skin is warm.  Psychiatric: Affect normal.     LABORATORY DATA:  I have reviewed the data as listed Lab Results  Component Value Date   WBC 13.0 (H)  05/31/2016   HGB 15.6 05/31/2016   HCT 45.6 05/31/2016   MCV 92.7 05/31/2016   PLT 257 05/31/2016   No results for input(s): NA, K, CL, CO2, GLUCOSE, BUN, CREATININE, CALCIUM, GFRNONAA, GFRAA, PROT, ALBUMIN, AST, ALT, ALKPHOS, BILITOT, BILIDIR, IBILI in the last 8760 hours.  RADIOGRAPHIC STUDIES: I have personally reviewed the radiological images as listed and agreed with the findings in the report. Nm Sentinel Node Injection  Result Date: 10/04/2018 CLINICAL DATA:  Right breast cancer. EXAM: NUCLEAR MEDICINE BREAST LYMPHOSCINTIGRAPHY TECHNIQUE: Intradermal injection of radiopharmaceutical was performed at the 12 o'clock, 3 o'clock, 6 o'clock, and 9 o'clock positions around the right nipple. The patient was then sent to the operating room where the sentinel node(s) were identified and removed by the surgeon. RADIOPHARMACEUTICALS:  Total of 1 mCi Millipore-filtered Technetium-1msulfur colloid, injected in four aliquots of 0.25 mCi  each. IMPRESSION: Uncomplicated intradermal injection of a total of 1 mCi Technetium-47msulfur colloid for purposes of sentinel node identification. Electronically Signed   By: JCorrie MckusickD.O.   On: 10/04/2018 09:46   Mm Breast Surgical Specimen  Result Date: 10/04/2018 CLINICAL DATA:  Needle localization prior to surgery. EXAM: NEEDLE LOCALIZATION OF THE RIGHT BREAST WITH MAMMO GUIDANCE COMPARISON:  Previous exams. FINDINGS: Patient presents for needle localization prior to surgery. I met with the patient and we discussed the procedure of needle localization including benefits and alternatives. We discussed the high likelihood of a successful procedure. We discussed the risks of the procedure, including infection, bleeding, tissue injury, and further surgery. Informed, written consent was given. The usual time-out protocol was performed immediately prior to the procedure. Using mammographic guidance, sterile technique, 1% lidocaine and a 7 cm modified Kopans needle,  the known malignancy and biopsy clip were localized using superior approach. The images were marked for the surgeon. IMPRESSION: Needle localization right breast. No apparent complications. Electronically Signed   By: DDorise BullionIII M.D   On: 10/04/2018 08:43   Mm Rt Plc Breast Loc Dev   1st Lesion  Inc Mammo Guide  Result Date: 10/04/2018 CLINICAL DATA:  Localization of right breast cancer/biopsy clip. EXAM: NEEDLE LOCALIZATION OF THE RIGHT BREAST WITH MAMMO GUIDANCE COMPARISON:  Previous exams. FINDINGS: Patient presents for needle localization prior to surgery. I met with the patient and we discussed the procedure of needle localization including benefits and alternatives. We discussed the high likelihood of a successful procedure. We discussed the risks of the procedure, including infection, bleeding, tissue injury, and further surgery. Informed, written consent was given. The usual time-out protocol was performed immediately prior to the procedure. Using mammographic guidance, sterile technique, 1% lidocaine and a 7 cm modified Kopans needle, the biopsy clip was localized using superior approach. The images were marked for the surgeon. IMPRESSION: Needle localization right breast. No apparent complications. Electronically Signed   By: DDorise BullionIII M.D   On: 10/04/2018 09:12    ASSESSMENT & PLAN:   Carcinoma of upper-outer quadrant of right breast in female, estrogen receptor positive ([place] # Stage IA ER PR positive HER-2/neu negative.  Reviewed the pathology and surgery post surgery.  #Had a long discussion with the patient regarding Oncotype and helping to assess her risk of recurrence/assess benefit of chemotherapy.  Clinically she is less likely to have low risk Oncotype.  However given age/26 mm sized tumor I think Oncotype is reasonable.  #Discussed the mechanism of action of antihormone therapy-with blocking of estrogen to prevent breast cancer.  Also discussed the potential side  effects including but not limited to arthralgias hot flashes and increased risk of osteoporosis.  # Genetics-age less than 89 no significant family history; no children.   # DISPOSITION: # Referral to Dr.Crystal rJK:KXFGHWcancer # Referral to Genetics dx; breast cancer # follow up  In 6 weeks-MD; no labs-Dr.B  All questions were answered. The patient knows to call the clinic with any problems, questions or concerns.    GCammie Sickle MD 10/31/2018 7:36 PM

## 2018-10-31 ENCOUNTER — Other Ambulatory Visit: Payer: Self-pay

## 2018-11-01 ENCOUNTER — Ambulatory Visit
Admission: RE | Admit: 2018-11-01 | Discharge: 2018-11-01 | Disposition: A | Discharge status: Home / Self Care | Payer: BLUE CROSS/BLUE SHIELD | Source: Ambulatory Visit | Attending: Radiation Oncology | Admitting: Radiation Oncology

## 2018-11-01 ENCOUNTER — Encounter: Payer: Self-pay | Admitting: Radiation Oncology

## 2018-11-01 ENCOUNTER — Other Ambulatory Visit: Payer: Self-pay

## 2018-11-01 VITALS — BP 135/85 | HR 70 | Temp 97.7°F | Resp 16 | Wt 167.8 lb

## 2018-11-01 DIAGNOSIS — I10 Essential (primary) hypertension: Secondary | ICD-10-CM | POA: Diagnosis not present

## 2018-11-01 DIAGNOSIS — Z7982 Long term (current) use of aspirin: Secondary | ICD-10-CM | POA: Insufficient documentation

## 2018-11-01 DIAGNOSIS — Z79899 Other long term (current) drug therapy: Secondary | ICD-10-CM | POA: Diagnosis not present

## 2018-11-01 DIAGNOSIS — Z170 Estrogen receptor positive status [ER+]: Secondary | ICD-10-CM | POA: Diagnosis not present

## 2018-11-01 DIAGNOSIS — Z87891 Personal history of nicotine dependence: Secondary | ICD-10-CM | POA: Diagnosis not present

## 2018-11-01 DIAGNOSIS — C50411 Malignant neoplasm of upper-outer quadrant of right female breast: Secondary | ICD-10-CM | POA: Diagnosis present

## 2018-11-01 DIAGNOSIS — Z803 Family history of malignant neoplasm of breast: Secondary | ICD-10-CM | POA: Insufficient documentation

## 2018-11-01 NOTE — Consult Note (Signed)
NEW PATIENT EVALUATION  Name: Penny Chase  MRN: [id number]  Date:   11/01/2018     DOB: August 27, 1970   This 48 y.o. female patient presents to the clinic for initial evaluation of Ia invasive lobular carcinoma of the right breast (T2 N0 M0 ER/PR positive HER-2/neu negative status post wide local excision.  REFERRING PHYSICIAN: Kirk Ruths, MD  CHIEF COMPLAINT:  Chief Complaint  Patient presents with  . Breast Cancer    Initial consultation of breast cancer    DIAGNOSIS: The encounter diagnosis was Carcinoma of upper-outer quadrant of right breast in female, estrogen receptor positive ([place]).   PREVIOUS INVESTIGATIONS:  Mammogram and ultrasound reviewed Pathology reports reviewed Clinical notes reviewed  HPI: patient is a 48 year old female who presented with an abnormal mammogram of her right breast showing a 1.1 cm lesion at the 10:00 position of the right breast 6 cm from the nipple. This was confirmed on ultrasound and ultrasound-guided biopsy showed invasive lobular carcinoma. Patient then underwent wide local excision showing a 2.6 cm invasiveobular carcinoma ER/PR positive HER-2/neu negative.margins were clear at 1 cm.4 sentinel lymph nodes were negative for metastatic disease. Patient has done well postoperatively has been evaluated by medical oncology and Oncotype DX has been ordered. She is having some pain and tenderness in that right breast consistent with scar tissue and healing. She specifically denies cough or bone pain.  PLANNED TREATMENT REGIMEN: hypofractionated right breastradiation therapy  PAST MEDICAL HISTORY:  has a past medical history of Hypertension.    PAST SURGICAL HISTORY:  Past Surgical History:  Procedure Laterality Date  . BREAST BIOPSY Right 09/12/2018   [place] core/"venus clip"-INVASIVE MAMMARY CARCINOMA, WITH LOBULAR FEATURES  . BREAST LUMPECTOMY Right 10/04/2018  . BREAST LUMPECTOMY WITH NEEDLE LOCALIZATION AND AXILLARY SENTINEL LYMPH NODE  BX Right 10/04/2018   Procedure: BREAST LUMPECTOMY WITH NEEDLE LOCALIZATION AND AXILLARY SENTINEL LYMPH NODE BX;  Surgeon: Herbert Pun, MD;  Location: [facility] ORS;  Service: General;  Laterality: Right;  . TONSILLECTOMY      FAMILY HISTORY: family history includes Breast cancer in her paternal aunt.  SOCIAL HISTORY:  reports that she has been smoking. She has never used smokeless tobacco. She reports current alcohol use. She reports that she does not use drugs.  ALLERGIES: Patient has no known allergies.  MEDICATIONS:  Current Outpatient Medications  Medication Sig Dispense Refill  . acetaminophen (TYLENOL) 500 MG tablet Take 500 mg by mouth every 6 (six) hours as needed for moderate pain.    Marland Kitchen amLODipine (NORVASC) 5 MG tablet Take 5 mg by mouth daily.    Marland Kitchen aspirin-sod bicarb-citric acid (ALKA-SELTZER) 325 MG TBEF tablet Take 325 mg by mouth every 6 (six) hours as needed (indigestion).    . pantoprazole (PROTONIX) 40 MG tablet Take 40 mg by mouth daily as needed (heartburn).      No current facility-administered medications for this encounter.     ECOG PERFORMANCE STATUS:  0 - Asymptomatic  REVIEW OF SYSTEMS:  Patient denies any weight loss, fatigue, weakness, fever, chills or night sweats. Patient denies any loss of vision, blurred vision. Patient denies any ringing  of the ears or hearing loss. No irregular heartbeat. Patient denies heart murmur or history of fainting. Patient denies any chest pain or pain radiating to her upper extremities. Patient denies any shortness of breath, difficulty breathing at night, cough or hemoptysis. Patient denies any swelling in the lower legs. Patient denies any nausea vomiting, vomiting of blood, or coffee ground material in  the vomitus. Patient denies any stomach pain. Patient states has had normal bowel movements no significant constipation or diarrhea. Patient denies any dysuria, hematuria or significant nocturia. Patient denies any problems  walking, swelling in the joints or loss of balance. Patient denies any skin changes, loss of hair or loss of weight. Patient denies any excessive worrying or anxiety or significant depression. Patient denies any problems with insomnia. Patient denies excessive thirst, polyuria, polydipsia. Patient denies any swollen glands, patient denies easy bruising or easy bleeding. Patient denies any recent infections, allergies or URI. Patient "s visual fields have not changed significantly in recent time.    PHYSICAL EXAM: BP 135/85 (BP Location: Left Arm, Patient Position: Sitting)   Pulse 70   Temp 97.7 F (36.5 C) (Tympanic)   Resp 16   Wt 167 lb 12.3 oz (76.1 kg)   BMI 27.92 kg/m  Right breast is wide local excision scar which is healing well. No dominant mass or nodularity is noted in either breast in 2 positions examined. No axillary or supraclavicular adenopathy is identified.Well-developed well-nourished patient in NAD. HEENT reveals PERLA, EOMI, discs not visualized.  Oral cavity is clear. No oral mucosal lesions are identified. Neck is clear without evidence of cervical or supraclavicular adenopathy. Lungs are clear to A&P. Cardiac examination is essentially unremarkable with regular rate and rhythm without murmur rub or thrill. Abdomen is benign with no organomegaly or masses noted. Motor sensory and DTR levels are equal and symmetric in the upper and lower extremities. Cranial nerves II through XII are grossly intact. Proprioception is intact. No peripheral adenopathy or edema is identified. No motor or sensory levels are noted. Crude visual fields are within normal range.  LABORATORY DATA: pathology reports reviewed    RADIOLOGY RESULTS:mammogram and ultrasound reviewed   IMPRESSION: stage IA invasive lobular carcinoma the right breast as was wide local excision ER/PR positive in 48 year old female  PLAN: t this time we'll wait for the results of Oncotype DX determination of chemotherapy. I  believe she will be candidate for hyperfractionated radiation therapy to the right breast over 3 weeks with a scar boost of 1000 cGy in 5 fractions. Risks and benefits of treatment including skin reaction fatigue alteration of blood counts possible inclusion of superficial lung all were described in detail to the patient and her husband. I personally separate ordered CT simulation in about 2 weeks time to allow [place] to review the results of her Oncotype DX. PatientHas my treatment plan well. She will be a candidate for antiestrogen therapy after completion of radiation.  I would like to take this opportunity to thank you for allowing me to participate in the care of your patient.Noreene Filbert, MD

## 2018-11-08 ENCOUNTER — Encounter: Payer: Self-pay | Admitting: Internal Medicine

## 2018-11-08 ENCOUNTER — Telehealth: Payer: Self-pay | Admitting: Internal Medicine

## 2018-11-08 NOTE — Telephone Encounter (Signed)
Discussed oncotype results- LOW risk; no chemo; follow up as planned.   GB

## 2018-11-13 ENCOUNTER — Other Ambulatory Visit: Payer: Self-pay

## 2018-11-14 ENCOUNTER — Other Ambulatory Visit: Payer: Self-pay

## 2018-11-14 ENCOUNTER — Ambulatory Visit
Admission: RE | Admit: 2018-11-14 | Discharge: 2018-11-14 | Disposition: A | Discharge status: Home / Self Care | Payer: BLUE CROSS/BLUE SHIELD | Source: Ambulatory Visit | Attending: Radiation Oncology | Admitting: Radiation Oncology

## 2018-11-14 DIAGNOSIS — C50411 Malignant neoplasm of upper-outer quadrant of right female breast: Secondary | ICD-10-CM | POA: Insufficient documentation

## 2018-11-14 DIAGNOSIS — Z510 Encounter for antineoplastic radiation therapy: Secondary | ICD-10-CM | POA: Diagnosis present

## 2018-11-14 DIAGNOSIS — Z170 Estrogen receptor positive status [ER+]: Secondary | ICD-10-CM | POA: Diagnosis not present

## 2018-11-14 DIAGNOSIS — Z79899 Other long term (current) drug therapy: Secondary | ICD-10-CM | POA: Insufficient documentation

## 2018-11-15 DIAGNOSIS — C50411 Malignant neoplasm of upper-outer quadrant of right female breast: Secondary | ICD-10-CM | POA: Diagnosis not present

## 2018-11-15 DIAGNOSIS — Z510 Encounter for antineoplastic radiation therapy: Secondary | ICD-10-CM | POA: Diagnosis present

## 2018-11-15 DIAGNOSIS — Z170 Estrogen receptor positive status [ER+]: Secondary | ICD-10-CM | POA: Diagnosis not present

## 2018-11-15 DIAGNOSIS — Z79899 Other long term (current) drug therapy: Secondary | ICD-10-CM | POA: Diagnosis not present

## 2018-11-16 ENCOUNTER — Other Ambulatory Visit: Payer: Self-pay | Admitting: *Deleted

## 2018-11-16 DIAGNOSIS — Z170 Estrogen receptor positive status [ER+]: Principal | ICD-10-CM

## 2018-11-16 DIAGNOSIS — C50411 Malignant neoplasm of upper-outer quadrant of right female breast: Secondary | ICD-10-CM

## 2018-11-17 ENCOUNTER — Encounter: Payer: Self-pay | Admitting: *Deleted

## 2018-11-17 NOTE — Progress Notes (Signed)
Patient is about to start radiation therapy.  Left her a message to call if she has any questions or needs

## 2018-11-20 ENCOUNTER — Other Ambulatory Visit: Payer: Self-pay

## 2018-11-21 ENCOUNTER — Ambulatory Visit
Admission: RE | Admit: 2018-11-21 | Discharge: 2018-11-21 | Disposition: A | Discharge status: Home / Self Care | Payer: BLUE CROSS/BLUE SHIELD | Source: Ambulatory Visit | Attending: Radiation Oncology | Admitting: Radiation Oncology

## 2018-11-21 ENCOUNTER — Other Ambulatory Visit: Payer: Self-pay

## 2018-11-21 DIAGNOSIS — Z510 Encounter for antineoplastic radiation therapy: Secondary | ICD-10-CM | POA: Diagnosis not present

## 2018-11-22 ENCOUNTER — Ambulatory Visit
Admission: RE | Admit: 2018-11-22 | Discharge: 2018-11-22 | Disposition: A | Discharge status: Home / Self Care | Payer: BLUE CROSS/BLUE SHIELD | Source: Ambulatory Visit | Attending: Radiation Oncology | Admitting: Radiation Oncology

## 2018-11-22 ENCOUNTER — Other Ambulatory Visit: Payer: Self-pay

## 2018-11-22 DIAGNOSIS — Z510 Encounter for antineoplastic radiation therapy: Secondary | ICD-10-CM | POA: Diagnosis not present

## 2018-11-23 ENCOUNTER — Ambulatory Visit
Admission: RE | Admit: 2018-11-23 | Discharge: 2018-11-23 | Disposition: A | Discharge status: Home / Self Care | Payer: BLUE CROSS/BLUE SHIELD | Source: Ambulatory Visit | Attending: Radiation Oncology | Admitting: Radiation Oncology

## 2018-11-23 ENCOUNTER — Other Ambulatory Visit: Payer: Self-pay

## 2018-11-23 DIAGNOSIS — Z510 Encounter for antineoplastic radiation therapy: Secondary | ICD-10-CM | POA: Diagnosis not present

## 2018-11-24 ENCOUNTER — Other Ambulatory Visit: Payer: Self-pay

## 2018-11-24 ENCOUNTER — Ambulatory Visit
Admission: RE | Admit: 2018-11-24 | Discharge: 2018-11-24 | Disposition: A | Discharge status: Home / Self Care | Payer: BLUE CROSS/BLUE SHIELD | Source: Ambulatory Visit | Attending: Radiation Oncology | Admitting: Radiation Oncology

## 2018-11-24 DIAGNOSIS — Z510 Encounter for antineoplastic radiation therapy: Secondary | ICD-10-CM | POA: Diagnosis not present

## 2018-11-27 ENCOUNTER — Ambulatory Visit
Admission: RE | Admit: 2018-11-27 | Discharge: 2018-11-27 | Disposition: A | Discharge status: Home / Self Care | Payer: BLUE CROSS/BLUE SHIELD | Source: Ambulatory Visit | Attending: Radiation Oncology | Admitting: Radiation Oncology

## 2018-11-27 ENCOUNTER — Other Ambulatory Visit: Payer: Self-pay

## 2018-11-27 DIAGNOSIS — Z510 Encounter for antineoplastic radiation therapy: Secondary | ICD-10-CM | POA: Diagnosis not present

## 2018-11-28 ENCOUNTER — Other Ambulatory Visit: Payer: Self-pay

## 2018-11-28 ENCOUNTER — Ambulatory Visit
Admission: RE | Admit: 2018-11-28 | Discharge: 2018-11-28 | Disposition: A | Discharge status: Home / Self Care | Payer: BLUE CROSS/BLUE SHIELD | Source: Ambulatory Visit | Attending: Radiation Oncology | Admitting: Radiation Oncology

## 2018-11-28 DIAGNOSIS — Z510 Encounter for antineoplastic radiation therapy: Secondary | ICD-10-CM | POA: Diagnosis not present

## 2018-11-29 ENCOUNTER — Other Ambulatory Visit: Payer: Self-pay

## 2018-11-29 ENCOUNTER — Ambulatory Visit
Admission: RE | Admit: 2018-11-29 | Discharge: 2018-11-29 | Disposition: A | Discharge status: Home / Self Care | Payer: BLUE CROSS/BLUE SHIELD | Source: Ambulatory Visit | Attending: Radiation Oncology | Admitting: Radiation Oncology

## 2018-11-29 DIAGNOSIS — Z510 Encounter for antineoplastic radiation therapy: Secondary | ICD-10-CM | POA: Diagnosis not present

## 2018-11-30 ENCOUNTER — Ambulatory Visit
Admission: RE | Admit: 2018-11-30 | Discharge: 2018-11-30 | Disposition: A | Discharge status: Home / Self Care | Payer: BLUE CROSS/BLUE SHIELD | Source: Ambulatory Visit | Attending: Radiation Oncology | Admitting: Radiation Oncology

## 2018-11-30 ENCOUNTER — Other Ambulatory Visit: Payer: Self-pay

## 2018-11-30 DIAGNOSIS — Z510 Encounter for antineoplastic radiation therapy: Secondary | ICD-10-CM | POA: Diagnosis not present

## 2018-12-01 ENCOUNTER — Other Ambulatory Visit: Payer: Self-pay

## 2018-12-01 ENCOUNTER — Telehealth: Payer: Self-pay | Admitting: Licensed Clinical Social Worker

## 2018-12-01 ENCOUNTER — Ambulatory Visit
Admission: RE | Admit: 2018-12-01 | Discharge: 2018-12-01 | Disposition: A | Discharge status: Home / Self Care | Payer: BLUE CROSS/BLUE SHIELD | Source: Ambulatory Visit | Attending: Radiation Oncology | Admitting: Radiation Oncology

## 2018-12-01 DIAGNOSIS — Z510 Encounter for antineoplastic radiation therapy: Secondary | ICD-10-CM | POA: Diagnosis not present

## 2018-12-01 NOTE — Telephone Encounter (Signed)
Called patient to offer a virtual visit at an earlier date than her current genetics appointment. She does not wish to do a virtual visit and wants to keep her appointment in July.

## 2018-12-04 ENCOUNTER — Inpatient Hospital Stay: Payer: BLUE CROSS/BLUE SHIELD | Attending: Internal Medicine

## 2018-12-04 ENCOUNTER — Telehealth: Payer: Self-pay | Admitting: *Deleted

## 2018-12-04 ENCOUNTER — Ambulatory Visit: Payer: BLUE CROSS/BLUE SHIELD

## 2018-12-04 NOTE — Telephone Encounter (Signed)
apts will be post-poned x 2 weeks. Per Dr. Jacinto Reap will keep the virtual visit on Wed 12/06/2018

## 2018-12-04 NOTE — Telephone Encounter (Signed)
Patient called to let Dr Rogue Bussing team know that a family member tested positive for [place] and is now quarantined.  Appts will need to be cancelled and rescheduled.

## 2018-12-05 ENCOUNTER — Ambulatory Visit: Payer: BLUE CROSS/BLUE SHIELD

## 2018-12-06 ENCOUNTER — Inpatient Hospital Stay: Payer: BLUE CROSS/BLUE SHIELD | Admitting: Internal Medicine

## 2018-12-06 ENCOUNTER — Ambulatory Visit: Payer: BLUE CROSS/BLUE SHIELD

## 2018-12-06 ENCOUNTER — Other Ambulatory Visit: Payer: Self-pay

## 2018-12-06 NOTE — Progress Notes (Unsigned)
I connected with Penny Chase on 12/06/18 at  2:00 PM EDT by {Blank single:19197::"video enabled telemedicine visit","telephone visit"} and verified that I am speaking with the correct person using two identifiers.  I discussed the limitations, risks, security and privacy concerns of performing an evaluation and management service by telemedicine and the availability of in-person appointments. I also discussed with the patient that there may be a patient responsible charge related to this service. The patient expressed understanding and agreed to proceed.    Other persons participating in the visit and their role in the encounter: ***  Patient's location: ***  Provider's location: ***   Oncology History   # JAN 2020- INVASIVE LOBULAR CARCINOMA, WITH FEATURES. pT2 (22m)psN0; (Dr.Cintron )Stage IA; ER/PR >90%; her-2 neu-NEG.  # recommend RT; oncotype   DIAGNOSIS: Right breast cancer  STAGE:     1    ;GOALS: Cure  CURRENT/MOST RECENT THERAPY:         Carcinoma of upper-outer quadrant of right breast in female, estrogen receptor positive ([place]   09/20/2018 Initial Diagnosis    Carcinoma of upper-outer quadrant of right breast in female, estrogen receptor positive ([facility]      Chief Complaint: ***    History of present illness:Penny Chase 48y.o.  female with history of   Observation/objective:  Assessment and plan: No problem-specific Assessment & Plan notes found for this encounter.    Follow-up instructions:  I discussed the assessment and treatment plan with the patient.  The patient was provided an opportunity to ask questions and all were answered.  The patient agreed with the plan and demonstrated understanding of instructions.  The patient was advised to call back or seek an in person evaluation if the symptoms worsen or if the condition fails to improve as anticipated.  I provided *** minutes of {Blank single:19197::"face-to-face video visit time","non  face-to-face telephone visit time"} during this encounter, and > 50% was spent counseling as documented under my assessment & plan.   Dr. GCharlaine DaltonCSoddy-Daisyat [facility]4/22/2020 1:28 PM

## 2018-12-07 ENCOUNTER — Ambulatory Visit: Payer: BLUE CROSS/BLUE SHIELD

## 2018-12-08 ENCOUNTER — Ambulatory Visit: Payer: BLUE CROSS/BLUE SHIELD

## 2018-12-11 ENCOUNTER — Ambulatory Visit: Payer: BLUE CROSS/BLUE SHIELD

## 2018-12-12 ENCOUNTER — Ambulatory Visit: Payer: BLUE CROSS/BLUE SHIELD

## 2018-12-13 ENCOUNTER — Ambulatory Visit: Payer: BLUE CROSS/BLUE SHIELD

## 2018-12-14 ENCOUNTER — Ambulatory Visit: Payer: BLUE CROSS/BLUE SHIELD

## 2018-12-14 ENCOUNTER — Telehealth: Payer: Self-pay

## 2018-12-14 NOTE — Telephone Encounter (Signed)
The following was sent to Dr. Frazier Richards and Nani Gasser, PA at [facility] on behalf of Dr. Eulas Post Chrystal:   Penny Chase April 13, 1971 is currently undergoing radiation for breast cancer.  She had tested positive for COVID 19 on 12/04/18.   Due to ASCO guidelines in order to restart her radiation she will need to be retested.   She is going to call [facility] to have the test arranged for clearance to return to the [facility]   These are the ASCO guidelines:  It is unclear how long a delay after the infection has resolved may be necessary before initiating/restarting anti-cancer therapy, but treatment should not be resumed until symptoms of COVID-19 have resolved and there is some certainty the virus is no longer present (e.g., a negative SARS-Cov-2 test), unless the cancer is rapidly progressing and the risk: benefit assessment favors proceeding with cancer treatment.  In the absence of cancer-specific guidance, the CDC has issued recommendations on discontinuing transmission-based precautions for patients with COVID-19; initiating/resuming anti-cancer therapy once transmission-based precautions are no longer necessary would be reasonable. The [employer] for [place] (NICE) has published rapid guidance on the delivery of anti-cancer therapy that suggests treatment may be initiated or resumed after one negative SARS-Cov-2 test.  Please call Dr. Noreene Filbert if you have any questions at [phone] or [phone].

## 2018-12-15 ENCOUNTER — Ambulatory Visit: Payer: BLUE CROSS/BLUE SHIELD

## 2018-12-18 ENCOUNTER — Ambulatory Visit: Payer: BLUE CROSS/BLUE SHIELD

## 2018-12-19 ENCOUNTER — Ambulatory Visit: Payer: BLUE CROSS/BLUE SHIELD

## 2018-12-20 ENCOUNTER — Ambulatory Visit: Payer: BLUE CROSS/BLUE SHIELD

## 2018-12-21 ENCOUNTER — Ambulatory Visit
Admission: RE | Admit: 2018-12-21 | Discharge: 2018-12-21 | Disposition: A | Discharge status: Home / Self Care | Payer: BLUE CROSS/BLUE SHIELD | Source: Ambulatory Visit | Attending: Radiation Oncology | Admitting: Radiation Oncology

## 2018-12-21 ENCOUNTER — Other Ambulatory Visit: Payer: Self-pay

## 2018-12-21 ENCOUNTER — Ambulatory Visit: Payer: BLUE CROSS/BLUE SHIELD

## 2018-12-21 DIAGNOSIS — Z79899 Other long term (current) drug therapy: Secondary | ICD-10-CM | POA: Insufficient documentation

## 2018-12-21 DIAGNOSIS — Z170 Estrogen receptor positive status [ER+]: Secondary | ICD-10-CM | POA: Insufficient documentation

## 2018-12-21 DIAGNOSIS — Z510 Encounter for antineoplastic radiation therapy: Secondary | ICD-10-CM | POA: Diagnosis not present

## 2018-12-21 DIAGNOSIS — C50411 Malignant neoplasm of upper-outer quadrant of right female breast: Secondary | ICD-10-CM | POA: Insufficient documentation

## 2018-12-22 ENCOUNTER — Other Ambulatory Visit: Payer: Self-pay

## 2018-12-22 ENCOUNTER — Ambulatory Visit
Admission: RE | Admit: 2018-12-22 | Discharge: 2018-12-22 | Disposition: A | Discharge status: Home / Self Care | Payer: BLUE CROSS/BLUE SHIELD | Source: Ambulatory Visit | Attending: Radiation Oncology | Admitting: Radiation Oncology

## 2018-12-22 ENCOUNTER — Ambulatory Visit: Payer: BLUE CROSS/BLUE SHIELD

## 2018-12-22 DIAGNOSIS — Z510 Encounter for antineoplastic radiation therapy: Secondary | ICD-10-CM | POA: Diagnosis not present

## 2018-12-25 ENCOUNTER — Ambulatory Visit
Admission: RE | Admit: 2018-12-25 | Discharge: 2018-12-25 | Disposition: A | Discharge status: Home / Self Care | Payer: BLUE CROSS/BLUE SHIELD | Source: Ambulatory Visit | Attending: Radiation Oncology | Admitting: Radiation Oncology

## 2018-12-25 ENCOUNTER — Other Ambulatory Visit: Payer: Self-pay

## 2018-12-25 DIAGNOSIS — Z510 Encounter for antineoplastic radiation therapy: Secondary | ICD-10-CM | POA: Diagnosis not present

## 2018-12-26 ENCOUNTER — Other Ambulatory Visit: Payer: Self-pay

## 2018-12-26 ENCOUNTER — Ambulatory Visit
Admission: RE | Admit: 2018-12-26 | Discharge: 2018-12-26 | Disposition: A | Discharge status: Home / Self Care | Payer: BLUE CROSS/BLUE SHIELD | Source: Ambulatory Visit | Attending: Radiation Oncology | Admitting: Radiation Oncology

## 2018-12-26 DIAGNOSIS — Z510 Encounter for antineoplastic radiation therapy: Secondary | ICD-10-CM | POA: Diagnosis not present

## 2018-12-27 ENCOUNTER — Ambulatory Visit
Admission: RE | Admit: 2018-12-27 | Discharge: 2018-12-27 | Disposition: A | Discharge status: Home / Self Care | Payer: BLUE CROSS/BLUE SHIELD | Source: Ambulatory Visit | Attending: Radiation Oncology | Admitting: Radiation Oncology

## 2018-12-27 ENCOUNTER — Other Ambulatory Visit: Payer: Self-pay

## 2018-12-27 DIAGNOSIS — Z510 Encounter for antineoplastic radiation therapy: Secondary | ICD-10-CM | POA: Diagnosis not present

## 2018-12-28 ENCOUNTER — Ambulatory Visit
Admission: RE | Admit: 2018-12-28 | Discharge: 2018-12-28 | Disposition: A | Discharge status: Home / Self Care | Payer: BLUE CROSS/BLUE SHIELD | Source: Ambulatory Visit | Attending: Radiation Oncology | Admitting: Radiation Oncology

## 2018-12-28 ENCOUNTER — Other Ambulatory Visit: Payer: Self-pay

## 2018-12-28 ENCOUNTER — Ambulatory Visit: Payer: BLUE CROSS/BLUE SHIELD

## 2018-12-28 DIAGNOSIS — Z510 Encounter for antineoplastic radiation therapy: Secondary | ICD-10-CM | POA: Diagnosis not present

## 2018-12-29 ENCOUNTER — Other Ambulatory Visit: Payer: Self-pay

## 2018-12-29 ENCOUNTER — Ambulatory Visit: Payer: BLUE CROSS/BLUE SHIELD

## 2018-12-29 ENCOUNTER — Ambulatory Visit
Admission: RE | Admit: 2018-12-29 | Discharge: 2018-12-29 | Disposition: A | Discharge status: Home / Self Care | Payer: BLUE CROSS/BLUE SHIELD | Source: Ambulatory Visit | Attending: Radiation Oncology | Admitting: Radiation Oncology

## 2018-12-29 DIAGNOSIS — Z510 Encounter for antineoplastic radiation therapy: Secondary | ICD-10-CM | POA: Diagnosis not present

## 2019-01-01 ENCOUNTER — Ambulatory Visit: Payer: BLUE CROSS/BLUE SHIELD

## 2019-01-01 ENCOUNTER — Other Ambulatory Visit: Payer: Self-pay

## 2019-01-01 ENCOUNTER — Ambulatory Visit
Admission: RE | Admit: 2019-01-01 | Discharge: 2019-01-01 | Disposition: A | Discharge status: Home / Self Care | Payer: BLUE CROSS/BLUE SHIELD | Source: Ambulatory Visit | Attending: Radiation Oncology | Admitting: Radiation Oncology

## 2019-01-01 DIAGNOSIS — Z510 Encounter for antineoplastic radiation therapy: Secondary | ICD-10-CM | POA: Diagnosis not present

## 2019-01-02 ENCOUNTER — Ambulatory Visit: Payer: BLUE CROSS/BLUE SHIELD

## 2019-01-02 ENCOUNTER — Ambulatory Visit
Admission: RE | Admit: 2019-01-02 | Discharge: 2019-01-02 | Disposition: A | Discharge status: Home / Self Care | Payer: BLUE CROSS/BLUE SHIELD | Source: Ambulatory Visit | Attending: Radiation Oncology | Admitting: Radiation Oncology

## 2019-01-02 ENCOUNTER — Other Ambulatory Visit: Payer: Self-pay

## 2019-01-02 DIAGNOSIS — Z510 Encounter for antineoplastic radiation therapy: Secondary | ICD-10-CM | POA: Diagnosis not present

## 2019-01-03 ENCOUNTER — Ambulatory Visit: Payer: BLUE CROSS/BLUE SHIELD

## 2019-01-03 ENCOUNTER — Inpatient Hospital Stay: Payer: BLUE CROSS/BLUE SHIELD | Attending: Internal Medicine | Admitting: Internal Medicine

## 2019-01-03 ENCOUNTER — Ambulatory Visit
Admission: RE | Admit: 2019-01-03 | Discharge: 2019-01-03 | Disposition: A | Discharge status: Home / Self Care | Payer: BLUE CROSS/BLUE SHIELD | Source: Ambulatory Visit | Attending: Radiation Oncology | Admitting: Radiation Oncology

## 2019-01-03 ENCOUNTER — Encounter: Payer: Self-pay | Admitting: Internal Medicine

## 2019-01-03 DIAGNOSIS — C50411 Malignant neoplasm of upper-outer quadrant of right female breast: Secondary | ICD-10-CM | POA: Diagnosis not present

## 2019-01-03 DIAGNOSIS — Z170 Estrogen receptor positive status [ER+]: Secondary | ICD-10-CM | POA: Diagnosis not present

## 2019-01-03 DIAGNOSIS — Z510 Encounter for antineoplastic radiation therapy: Secondary | ICD-10-CM | POA: Diagnosis not present

## 2019-01-03 MED ORDER — TAMOXIFEN CITRATE 20 MG PO TABS: 20.0000 mg | ORAL_TABLET | Freq: Every day | ORAL | 3 refills | Status: DC

## 2019-01-03 NOTE — Progress Notes (Signed)
I connected with Penny Chase on 01/03/19 at 10:00 AM EDT by video enabled telemedicine visit and verified that I am speaking with the correct person using two identifiers.  I discussed the limitations, risks, security and privacy concerns of performing an evaluation and management service by telemedicine and the availability of in-person appointments. I also discussed with the patient that there may be a patient responsible charge related to this service. The patient expressed understanding and agreed to proceed.    Other persons participating in the visit and their role in the encounter:none Patient's location: Home Provider's location: Home  Oncology History   # JAN 2020- INVASIVE LOBULAR CARCINOMA, WITH FEATURES. pT2 (26mm)psN0; (Dr.Cintron ) Stage IA; ER/PR >90%; her-2 neu-NEG.  #  RT finish May 30th; oncotype RS score- 11; 3% DR at 9 years; No adjuvant chemo  # June 1st 2020- Start Tam [pre-menopausal]  # HTN/ Covid positive [April 2020]   DIAGNOSIS: Right breast cancer  STAGE:     1    ;GOALS: Cure  CURRENT/MOST RECENT THERAPY: start TAM        Carcinoma of upper-outer quadrant of right breast in female, estrogen receptor positive (HCC)   09/20/2018 Initial Diagnosis    Carcinoma of upper-outer quadrant of right breast in female, estrogen receptor positive (HCC)      Chief Complaint: breast cancer    History of present illness:Penny Chase 48 y.o.  female with history of stage I breast cancer currently finishing up radiation on May 30.  Patient's radiation treatment were on hold because of her being positive for COVID.  She has recovered well.  She is currently finishing up radiation May 30.  She is tolerating radiation well.  No significant skin rash or skin burn.  Observation/objective:  Assessment and plan: Carcinoma of upper-outer quadrant of right breast in female, estrogen receptor positive (HCC) # Stage IA ER PR positive HER-2/neu negative.;  Oncotype low  risk endocrine score of 11 which translates to 3% distant risk of recurrence 9 years.  Benefit from adjuvant chemotherapy less than 1%; hence chemotherapy not offered.  # Discussed the role of anti-hormonal therapy mechanism of action; since patient is premenopausal recommend tamoxifen.  I would recommend tamoxifen for 5 years.  # Long discussion regarding the potential adverse events on tamoxifen including but not limited to hot flashes, mood swings, thromboembolic events strokes and also small risk of uterine cancers.  Also discussed the drug interaction with older SSRI.   # Genetics-age less than 50; no significant family history; no children. Genetics appt in July 2020.   # DISPOSITION: # follow up week of June 22nd-DOXIMITY-MD; no labs-Dr.B    Follow-up instructions:  I discussed the assessment and treatment plan with the patient.  The patient was provided an opportunity to ask questions and all were answered.  The patient agreed with the plan and demonstrated understanding of instructions.  The patient was advised to call back or seek an in person evaluation if the symptoms worsen or if the condition fails to improve as anticipated.   Dr.   [facility] at [facility] 01/03/2019 10:51 AM 

## 2019-01-03 NOTE — Assessment & Plan Note (Addendum)
#  Stage IA ER PR positive HER-2/neu negative.;  Oncotype low risk endocrine score of 11 which translates to 3% distant risk of recurrence 9 years.  Benefit from adjuvant chemotherapy less than 1%; hence chemotherapy not offered.  # Discussed the role of anti-hormonal therapy mechanism of action; since patient is premenopausal recommend tamoxifen.  I would recommend tamoxifen for 5 years.  # Long discussion regarding the potential adverse events on tamoxifen including but not limited to hot flashes, mood swings, thromboembolic events strokes and also small risk of uterine cancers.  Also discussed the drug interaction with older SSRI.   # Genetics-age less than 11; no significant family history; no children. Genetics appt in July 2020.   # DISPOSITION: # follow up week of June 22nd-DOXIMITY-MD; no labs-Dr.B

## 2019-01-04 ENCOUNTER — Ambulatory Visit
Admission: RE | Admit: 2019-01-04 | Discharge: 2019-01-04 | Disposition: A | Discharge status: Home / Self Care | Payer: BLUE CROSS/BLUE SHIELD | Source: Ambulatory Visit | Attending: Radiation Oncology | Admitting: Radiation Oncology

## 2019-01-04 ENCOUNTER — Other Ambulatory Visit: Payer: Self-pay

## 2019-01-04 DIAGNOSIS — Z510 Encounter for antineoplastic radiation therapy: Secondary | ICD-10-CM | POA: Diagnosis not present

## 2019-01-05 ENCOUNTER — Ambulatory Visit
Admission: RE | Admit: 2019-01-05 | Discharge: 2019-01-05 | Disposition: A | Discharge status: Home / Self Care | Payer: BLUE CROSS/BLUE SHIELD | Source: Ambulatory Visit | Attending: Radiation Oncology | Admitting: Radiation Oncology

## 2019-01-05 ENCOUNTER — Other Ambulatory Visit: Payer: Self-pay

## 2019-01-05 DIAGNOSIS — Z510 Encounter for antineoplastic radiation therapy: Secondary | ICD-10-CM | POA: Diagnosis not present

## 2019-01-09 ENCOUNTER — Ambulatory Visit: Payer: BLUE CROSS/BLUE SHIELD

## 2019-01-09 ENCOUNTER — Ambulatory Visit
Admission: RE | Admit: 2019-01-09 | Discharge: 2019-01-09 | Disposition: A | Discharge status: Home / Self Care | Payer: BLUE CROSS/BLUE SHIELD | Source: Ambulatory Visit | Attending: Radiation Oncology | Admitting: Radiation Oncology

## 2019-01-09 ENCOUNTER — Other Ambulatory Visit: Payer: Self-pay

## 2019-01-09 DIAGNOSIS — Z510 Encounter for antineoplastic radiation therapy: Secondary | ICD-10-CM | POA: Diagnosis not present

## 2019-01-10 ENCOUNTER — Other Ambulatory Visit: Payer: Self-pay

## 2019-01-10 ENCOUNTER — Ambulatory Visit: Payer: BLUE CROSS/BLUE SHIELD

## 2019-01-10 ENCOUNTER — Ambulatory Visit
Admission: RE | Admit: 2019-01-10 | Discharge: 2019-01-10 | Disposition: A | Discharge status: Home / Self Care | Payer: BLUE CROSS/BLUE SHIELD | Source: Ambulatory Visit | Attending: Radiation Oncology | Admitting: Radiation Oncology

## 2019-01-10 DIAGNOSIS — Z510 Encounter for antineoplastic radiation therapy: Secondary | ICD-10-CM | POA: Diagnosis not present

## 2019-01-11 ENCOUNTER — Ambulatory Visit
Admission: RE | Admit: 2019-01-11 | Discharge: 2019-01-11 | Disposition: A | Discharge status: Home / Self Care | Payer: BLUE CROSS/BLUE SHIELD | Source: Ambulatory Visit | Attending: Radiation Oncology | Admitting: Radiation Oncology

## 2019-01-11 ENCOUNTER — Ambulatory Visit: Payer: BLUE CROSS/BLUE SHIELD

## 2019-01-11 ENCOUNTER — Other Ambulatory Visit: Payer: Self-pay

## 2019-01-11 DIAGNOSIS — Z510 Encounter for antineoplastic radiation therapy: Secondary | ICD-10-CM | POA: Diagnosis not present

## 2019-01-12 ENCOUNTER — Ambulatory Visit
Admission: RE | Admit: 2019-01-12 | Discharge: 2019-01-12 | Disposition: A | Discharge status: Home / Self Care | Payer: BLUE CROSS/BLUE SHIELD | Source: Ambulatory Visit | Attending: Radiation Oncology | Admitting: Radiation Oncology

## 2019-01-12 ENCOUNTER — Ambulatory Visit: Payer: BLUE CROSS/BLUE SHIELD

## 2019-01-12 ENCOUNTER — Other Ambulatory Visit: Payer: Self-pay

## 2019-01-12 DIAGNOSIS — Z510 Encounter for antineoplastic radiation therapy: Secondary | ICD-10-CM | POA: Diagnosis not present

## 2019-01-15 ENCOUNTER — Ambulatory Visit: Payer: BLUE CROSS/BLUE SHIELD

## 2019-01-16 ENCOUNTER — Ambulatory Visit: Payer: BLUE CROSS/BLUE SHIELD

## 2019-02-06 ENCOUNTER — Telehealth: Payer: Self-pay | Admitting: Internal Medicine

## 2019-02-07 ENCOUNTER — Other Ambulatory Visit: Payer: Self-pay

## 2019-02-07 ENCOUNTER — Encounter: Payer: Self-pay | Admitting: Internal Medicine

## 2019-02-07 ENCOUNTER — Inpatient Hospital Stay: Payer: BLUE CROSS/BLUE SHIELD | Attending: Internal Medicine | Admitting: Internal Medicine

## 2019-02-07 DIAGNOSIS — Z79810 Long term (current) use of selective estrogen receptor modulators (SERMs): Secondary | ICD-10-CM

## 2019-02-07 DIAGNOSIS — Z170 Estrogen receptor positive status [ER+]: Secondary | ICD-10-CM

## 2019-02-07 DIAGNOSIS — C50411 Malignant neoplasm of upper-outer quadrant of right female breast: Secondary | ICD-10-CM

## 2019-02-07 MED ORDER — CITALOPRAM HYDROBROMIDE 20 MG PO TABS: 20.0000 mg | ORAL_TABLET | Freq: Every day | ORAL | 3 refills | Status: DC

## 2019-02-07 NOTE — Assessment & Plan Note (Addendum)
#  Stage IA ER PR positive HER-2/neu negative.;  Oncotype low risk - on Tamoxifen.  Tolerating poorly because of extreme mood swings anxiety hot flashes.   #I would recommend stopping tamoxifen for the next 2 weeks or so; and if symptoms improve then restart. [See below]  # Mood swings/anxiety/hot flashes-secondary to tamoxifen.  Recommend Celexa 20 mg a day.  Discussed that it would probably take a few weeks before its effects kick in.  So I would recommend holding tamoxifen for the next 2 weeks.  Patient is agreeable.   # Genetics-age less than 72; no significant family history; no children.  Awaiting genetics appointment in July.  # DISPOSITION: # Follow up MD-Dox; 1 month/ no labs- Dr.B

## 2019-02-07 NOTE — Progress Notes (Signed)
I connected with Penny Chase on 02/07/19 at  2:30 PM EDT by video enabled telemedicine visit and verified that I am speaking with the correct person using two identifiers.  I discussed the limitations, risks, security and privacy concerns of performing an evaluation and management service by telemedicine and the availability of in-person appointments. I also discussed with the patient that there may be a patient responsible charge related to this service. The patient expressed understanding and agreed to proceed.    Other persons participating in the visit and their role in the encounter: RN/medical reconciliation  Patient's location: Home Provider's location: Home  Oncology History Overview Note  # JAN 2020- INVASIVE LOBULAR CARCINOMA, WITH FEATURES. pT2 (74m)psN0; (Dr.Cintron ) Stage IA; ER/PR >90%; her-2 neu-NEG.  #  RT finish May 30th; oncotype RS score- 11; 3% DR at 9 years; No adjuvant chemo  # June 1st 2020- Start Tam [pre-menopausal]  # HTN/ Covid positive [April 25929]  DIAGNOSIS: Right breast cancer  STAGE:     1    ;GOALS: Cure  CURRENT/MOST RECENT THERAPY: start TAM      Carcinoma of upper-outer quadrant of right breast in female, estrogen receptor positive ([facility]  09/20/2018 Initial Diagnosis   Carcinoma of upper-outer quadrant of right breast in female, estrogen receptor positive ([facility]      Chief Complaint: Breast cancer   History of present illness:Penny Chase 48y.o.  female with history of stage I breast cancer currently on adjuvant tamoxifen is here for follow-up.  Patient denies any new lumps or bumps.  Appetite is good.  No weight loss.  However she is having issues with anxiety/depression/mood swings.  Denies any plans for self-harm.  Patient also admits to hot flashes multiple times a day.  She is quite frustrated.  Observation/objective: None  Assessment and plan: Carcinoma of upper-outer quadrant of right breast in female, estrogen receptor  positive ([facility] # Stage IA ER PR positive HER-2/neu negative.;  Oncotype low risk - on Tamoxifen.  Tolerating poorly because of extreme mood swings anxiety hot flashes.   #I would recommend stopping tamoxifen for the next 2 weeks or so; and if symptoms improve then restart. [See below]  # Mood swings/anxiety/hot flashes-secondary to tamoxifen.  Recommend Celexa 20 mg a day.  Discussed that it would probably take a few weeks before its effects kick in.  So I would recommend holding tamoxifen for the next 2 weeks.  Patient is agreeable.   # Genetics-age less than 57 no significant family history; no children.  Awaiting genetics appointment in July.  # DISPOSITION: # Follow up MD-Dox; 1 month/ no labs- Dr.B  Follow-up instructions:  I discussed the assessment and treatment plan with the patient.  The patient was provided an opportunity to ask questions and all were answered.  The patient agreed with the plan and demonstrated understanding of instructions.  The patient was advised to call back or seek an in person evaluation if the symptoms worsen or if the condition fails to improve as anticipated.   Dr. GCharlaine DaltonCSuffield Depotat A[facility]6/24/2020 2:54 PM

## 2019-02-13 ENCOUNTER — Other Ambulatory Visit: Payer: Self-pay

## 2019-02-14 ENCOUNTER — Ambulatory Visit: Payer: BLUE CROSS/BLUE SHIELD | Admitting: Radiation Oncology

## 2019-03-06 ENCOUNTER — Telehealth: Payer: Self-pay | Admitting: Licensed Clinical Social Worker

## 2019-03-07 ENCOUNTER — Telehealth: Payer: Self-pay | Admitting: *Deleted

## 2019-03-07 ENCOUNTER — Inpatient Hospital Stay: Payer: BLUE CROSS/BLUE SHIELD | Admitting: Internal Medicine

## 2019-03-07 NOTE — Telephone Encounter (Signed)
4 calls attempts today by 2 rns have been made to reach patient for her virtual visit. I personally left a vm for patient to return my phone call.

## 2019-03-14 ENCOUNTER — Encounter: Payer: Self-pay | Admitting: Internal Medicine

## 2019-03-14 ENCOUNTER — Inpatient Hospital Stay: Payer: BLUE CROSS/BLUE SHIELD | Attending: Internal Medicine | Admitting: Internal Medicine

## 2019-03-14 DIAGNOSIS — C50411 Malignant neoplasm of upper-outer quadrant of right female breast: Secondary | ICD-10-CM

## 2019-03-14 DIAGNOSIS — Z170 Estrogen receptor positive status [ER+]: Secondary | ICD-10-CM

## 2019-03-14 NOTE — Progress Notes (Signed)
I connected with Penny Chase on 03/14/2019 at  2:15 PM EDT by video enabled telemedicine visit and verified that I am speaking with the correct person using two identifiers.  I discussed the limitations, risks, security and privacy concerns of performing an evaluation and management service by telemedicine and the availability of in-person appointments. I also discussed with the patient that there may be a patient responsible charge related to this service. The patient expressed understanding and agreed to proceed.    Other persons participating in the visit and their role in the encounter: RN/med reconciliation Patient's location: home  Provider's location: office   Oncology History Overview Note  # JAN 2020- INVASIVE LOBULAR CARCINOMA, WITH FEATURES. pT2 (31m)psN0; (Penny Chase ) Stage IA; ER/PR >90%; her-2 neu-NEG.  #  RT finish May 30th; oncotype RS score- 11; 3% DR at 9 years; No adjuvant chemo  # June 1st 2020- Start Tam [pre-menopausal]  # HTN/ Covid positive [April 25176]  DIAGNOSIS: Right breast cancer  STAGE:     1    ;GOALS: Cure  CURRENT/MOST RECENT THERAPY: start TAM      Carcinoma of upper-outer quadrant of right breast in female, estrogen receptor positive ([name]  09/20/2018 Initial Diagnosis   Carcinoma of upper-outer quadrant of right breast in female, estrogen receptor positive ([name]      Chief Complaint: Breast cancer   History of present illness:Penny Chase 48y.o.  female with history of stage I breast cancer ER PR positive HER-2/neu negative currently on tamoxifen.  Patient complains of mild to moderate fatigue.  Continues to have mild to moderate hot flashes.  Also mild mood swings which are overall improved on Celexa.  Not resolved.  Otherwise denies any fevers or chills.  Denies any headaches or new onset of bone pain.  Assessment and plan: Carcinoma of upper-outer quadrant of right breast in female, estrogen receptor positive ([name] # Stage IA ER  PR positive HER-2/neu negative.;  Oncotype low risk - on Tamoxifen.    #Continue tamoxifen; tolerating well with mild to moderate side effects [see below].  # Mood swings/anxiety/hot flashes-secondary to tamoxifen-currently on Celexa symptoms improved.  Not resolved.  Recommend continuing Celexa-symptoms should get better the next 2 to 3 months.  # Genetics-age less than 89 no significant family history; no children.  Awaiting genetics appointment tomorrow.  # DISPOSITION: # Follow up  4 month/ no labs- Penny Chase   Follow-up instructions:  I discussed the assessment and treatment plan with the patient.  The patient was provided an opportunity to ask questions and all were answered.  The patient agreed with the plan and demonstrated understanding of instructions.  The patient was advised to call back or seek an in person evaluation if the symptoms worsen or if the condition fails to improve as anticipated.   Dr. [facility]Charlaine [facility]03/16/2019 1:05 PM

## 2019-03-14 NOTE — Assessment & Plan Note (Addendum)
#  Stage IA ER PR positive HER-2/neu negative.;  Oncotype low risk - on Tamoxifen.    #Continue tamoxifen; tolerating well with mild to moderate side effects [see below].  # Mood swings/anxiety/hot flashes-secondary to tamoxifen-currently on Celexa symptoms improved.  Not resolved.  Recommend continuing Celexa-symptoms should get better the next 2 to 3 months.  # Genetics-age less than 57; no significant family history; no children.  Awaiting genetics appointment tomorrow.  # DISPOSITION: # Follow up  4 month/ no labs- Dr.B

## 2019-03-15 ENCOUNTER — Inpatient Hospital Stay (HOSPITAL_BASED_OUTPATIENT_CLINIC_OR_DEPARTMENT_OTHER): Payer: BLUE CROSS/BLUE SHIELD | Admitting: Licensed Clinical Social Worker

## 2019-03-15 ENCOUNTER — Inpatient Hospital Stay: Payer: BLUE CROSS/BLUE SHIELD

## 2019-03-15 ENCOUNTER — Encounter: Payer: Self-pay | Admitting: Licensed Clinical Social Worker

## 2019-03-15 DIAGNOSIS — Z170 Estrogen receptor positive status [ER+]: Secondary | ICD-10-CM

## 2019-03-15 DIAGNOSIS — C50411 Malignant neoplasm of upper-outer quadrant of right female breast: Secondary | ICD-10-CM

## 2019-03-15 DIAGNOSIS — Z803 Family history of malignant neoplasm of breast: Secondary | ICD-10-CM

## 2019-03-15 NOTE — Progress Notes (Signed)
REFERRING PROVIDER: Cammie Sickle, [place],  [place] [address]  PRIMARY PROVIDER:  Kirk Ruths, Chase  PRIMARY REASON FOR VISIT:  1. Carcinoma of upper-outer quadrant of right breast in female, estrogen receptor positive ([facility])   2. Family history of breast cancer    I connected with Penny Chase on 03/15/2019 at 1:30 PM EDT by Webex and verified that I am speaking with the correct person using two identifiers.    Patient location: home Provider location: [place]:   Penny Chase, a 48 y.o. female, was seen for a [facility] cancer genetics consultation at the request of Dr. Rogue Chase Chase to a personal and family  history of cancer.  Penny Chase presents to clinic today to discuss the possibility of a hereditary predisposition to cancer, genetic testing, and to further clarify her future cancer risks, as well as potential cancer risks for family members.   In 2020, at the age of 89, Penny Chase was diagnosed with invasive mammary carcinoma of the right breast, ER/PR+, Her2-. This was treated with partial mastectomy in February, radiation and tamoxifen.   CANCER HISTORY:  Oncology History Overview Note  # JAN 2020- INVASIVE LOBULAR CARCINOMA, WITH FEATURES. pT2 (65m)psN0; (Dr.Cintron ) Stage IA; ER/PR >90%; her-2 neu-NEG.  #  RT finish May 30th; oncotype RS score- 11; 3% DR at 8 years; No adjuvant chemo  # June 1st 2020- Start Tam [pre-menopausal]  # HTN/ Covid positive [April 21638]  DIAGNOSIS: Right breast cancer  STAGE:     1    ;GOALS: Cure  CURRENT/MOST RECENT THERAPY: start TAM      Carcinoma of upper-outer quadrant of right breast in female, estrogen receptor positive ([facility]  09/20/2018 Initial Diagnosis   Carcinoma of upper-outer quadrant of right breast in female, estrogen receptor positive ([facility]      RISK FACTORS:  Menarche was at age 48  First live birth at age no children. OCP use for  approximately 18 years.  Ovaries intact: yes.  Hysterectomy: no Colonoscopy: no not examined. Mammogram within the last year: Yes. Any excessive radiation exposure in the past: No.  Past Medical History:  Diagnosis Date  . Family history of breast cancer   . Hypertension     Past Surgical History:  Procedure Laterality Date  . BREAST BIOPSY Right 09/12/2018   u[place]/"venus clip"-INVASIVE MAMMARY CARCINOMA, WITH LOBULAR FEATURES  . BREAST LUMPECTOMY Right 10/04/2018  . BREAST LUMPECTOMY WITH NEEDLE LOCALIZATION AND AXILLARY SENTINEL LYMPH NODE BX Right 10/04/2018   Procedure: BREAST LUMPECTOMY WITH NEEDLE LOCALIZATION AND AXILLARY SENTINEL LYMPH NODE BX;  Surgeon: Penny Chase;  Location: [facility] ORS;  Service: General;  Laterality: Right;  . TONSILLECTOMY      Social History   Socioeconomic History  . Marital status: Married    Spouse name: Not on file  . Number of children: Not on file  . Years of education: Not on file  . Highest education level: Not on file  Occupational History  . Not on file  Social Needs  . Financial resource strain: Not on file  . Food insecurity    Worry: Not on file    Inability: Not on file  . Transportation needs    Medical: Not on file    Non-medical: Not on file  Tobacco Use  . Smoking status: Current Every Day Smoker  . Smokeless tobacco: Never Used  Substance and Sexual Activity  . Alcohol  use: Yes    Comment: OCCAS  . Drug use: No  . Sexual activity: Not on file  Lifestyle  . Physical activity    Days per week: Not on file    Minutes per session: Not on file  . Stress: Not on file  Relationships  . Social Herbalist on phone: Not on file    Gets together: Not on file    Attends religious service: Not on file    Active member of club or organization: Not on file    Attends meetings of clubs or organizations: Not on file    Relationship status: Not on file  Other Topics Concern  . Not on file  Social  History Narrative   Works at [employer];  Smoke 15 cig/day; No alcohol abuse; in [place] [place]; with husband.      FAMILY HISTORY:  We obtained a detailed, 4-generation family history.  Significant diagnoses are listed below: Family History  Problem Relation Age of Onset  . Breast cancer Paternal Aunt     Penny Chase does not have children. She has a sister, 61, and a brother, 2, neither have had any cancer diagnoses.   Penny Chase's mother died recently at 24, no history of cancer. Patient had 1 maternal aunt and 1 maternal uncle. Her aunt had lung cancer and is living at 18. Her uncle had a son that was diagnosed with prostate cancer at 67. Her maternal grandmother died at 45, grandfather died in his 46s.  Penny Chase's father died at 72, no cancers. Patient had 1 paternal aunt. Her aunt had breast cancer at 46 and died at 95. This aunt did not have children. Patient's paternal grandmother died at 33, grandfather died at 73.  Penny Chase is unaware of previous family history of genetic testing for hereditary cancer risks.  There is no reported Ashkenazi Jewish ancestry. There is no known consanguinity.  GENETIC COUNSELING ASSESSMENT: Penny Chase is a 48 y.o. female with a personal and family history which is somewhat suggestive of a hereditary cancer syndrome and predisposition to cancer. We, therefore, discussed and recommended the following at today's visit.   DISCUSSION: We discussed that 5-10% of breast cancer is hereditary, with most cases associated with BRCA1/BRCA2 mutations.  There are other genes that can be associated with hereditary breast cancer syndromes.  These include PALB2, ATM, CHEK2.  We discussed that testing is beneficial for several reasons including  knowing how to follow individuals after completing their treatment, and understand if other family members could be at risk for cancer and allow them to undergo genetic testing.   We reviewed the  characteristics, features and inheritance patterns of hereditary cancer syndromes. We also discussed genetic testing, including the appropriate family members to test, the process of testing, insurance coverage and turn-around-time for results. We discussed the implications of a negative, positive and/or variant of uncertain significant result. We recommended Penny Chase pursue genetic testing for the Common Hereditary Cancers gene panel.   The Common Hereditary Cancers Panel offered by [facility] includes sequencing and/or deletion duplication testing of the following 48 genes: APC, ATM, AXIN2, BARD1, BMPR1A, BRCA1, BRCA2, BRIP1, CDH1, CDKN2A (p14ARF), CDKN2A (p16INK4a), CKD4, CHEK2, CTNNA1, DICER1, EPCAM (Deletion/duplication testing only), GREM1 (promoter region deletion/duplication testing only), KIT, MEN1, MLH1, MSH2, MSH3, MSH6, MUTYH, NBN, NF1, NHTL1, PALB2, PDGFRA, PMS2, POLD1, POLE, PTEN, RAD50, RAD51C, RAD51D, RNF43, SDHB, SDHC, SDHD, SMAD4, SMARCA4. STK11, TP53, TSC1, TSC2, and VHL.  The following genes were  evaluated for sequence changes only: SDHA and HOXB13 c.251G>A variant only.  PLAN: Despite our recommendation, Penny Chase did not wish to pursue genetic testing at today's visit. We understand this decision and remain available to coordinate genetic testing at any time in the future. She would like to think about testing and will call me if she decides to proceed. We, therefore, recommend Penny Chase continue to follow the cancer screening guidelines given by her primary healthcare provider.  Lastly, we encouraged Penny Chase to remain in contact with cancer genetics annually so that we can continuously update the family history and inform her of any changes in cancer genetics and testing that may be of benefit for this family.   Penny Chase questions were answered to her satisfaction today. Our contact information was provided should additional questions or concerns arise. Thank you for the  referral and allowing [place] to share in the care of your patient.   Faith Rogue, MS, [facility] Genetic Counselor [email or web address] Phone: [phone]  The patient was seen for a total of 35 minutes in virtual genetic counseling.  Dr. Grayland Ormond was  available for discussion regarding this case.   _______________________________________________________________________ For Office Staff:  Number of people involved in session: 1 Was an Intern/ student involved with case: no

## 2019-03-19 ENCOUNTER — Other Ambulatory Visit: Payer: Self-pay

## 2019-03-19 ENCOUNTER — Encounter: Payer: Self-pay | Admitting: Radiation Oncology

## 2019-03-19 ENCOUNTER — Ambulatory Visit
Admission: RE | Admit: 2019-03-19 | Discharge: 2019-03-19 | Disposition: A | Discharge status: Home / Self Care | Payer: BLUE CROSS/BLUE SHIELD | Source: Ambulatory Visit | Attending: Radiation Oncology | Admitting: Radiation Oncology

## 2019-03-19 VITALS — BP 133/77 | HR 70 | Temp 98.2°F | Resp 18 | Wt 160.7 lb

## 2019-03-19 DIAGNOSIS — C50411 Malignant neoplasm of upper-outer quadrant of right female breast: Secondary | ICD-10-CM | POA: Diagnosis not present

## 2019-03-19 DIAGNOSIS — Z79810 Long term (current) use of selective estrogen receptor modulators (SERMs): Secondary | ICD-10-CM | POA: Diagnosis not present

## 2019-03-19 DIAGNOSIS — Z923 Personal history of irradiation: Secondary | ICD-10-CM | POA: Insufficient documentation

## 2019-03-19 DIAGNOSIS — Z170 Estrogen receptor positive status [ER+]: Secondary | ICD-10-CM | POA: Diagnosis not present

## 2019-03-19 NOTE — Progress Notes (Signed)
Radiation Oncology Follow up Note  Name: Penny Chase   Date:   03/19/2019 MRN:  [id number] DOB: 14-Oct-1970    This 48 y.o. female presents to the clinic today for 1 month follow-up status post whole breast radiation to her right breast for a T2N0 ER PR positive HER-2 negative stage I invasive lobular carcinoma.  REFERRING PROVIDER: Kirk Ruths, MD  HPI: Patient is a 48 year old female now seen at 1 month having completed whole breast radiation to her right breast for stage T2N0 ER PR positive HER-2 negative invasive lobular carcinoma of the right breast seen today in routine follow-up she is doing well she specifically denies breast tenderness cough or bone pain she still some hyperpigmentation of the skin which we would expect at this time..  COMPLICATIONS OF TREATMENT: none  FOLLOW UP COMPLIANCE: keeps appointments   PHYSICAL EXAM:  BP 133/77 (BP Location: Left Arm, Patient Position: Sitting)   Pulse 70   Temp 98.2 F (36.8 C) (Tympanic)   Resp 18   Wt 160 lb 11.2 oz (72.9 kg)   BMI 26.74 kg/m  Lungs are clear to A&P cardiac examination essentially unremarkable with regular rate and rhythm. No dominant mass or nodularity is noted in either breast in 2 positions examined. Incision is well-healed. No axillary or supraclavicular adenopathy is appreciated. Cosmetic result is excellent.  Still some slight hyperpigmentation of the skin is noted.  Well-developed well-nourished patient in NAD. HEENT reveals PERLA, EOMI, discs not visualized.  Oral cavity is clear. No oral mucosal lesions are identified. Neck is clear without evidence of cervical or supraclavicular adenopathy. Lungs are clear to A&P. Cardiac examination is essentially unremarkable with regular rate and rhythm without murmur rub or thrill. Abdomen is benign with no organomegaly or masses noted. Motor sensory and DTR levels are equal and symmetric in the upper and lower extremities. Cranial nerves II through XII are  grossly intact. Proprioception is intact. No peripheral adenopathy or edema is identified. No motor or sensory levels are noted. Crude visual fields are within normal range.  RADIOLOGY RESULTS: No current films for review  PLAN: Present time patient is doing well with no evidence of disease.  I am pleased with her overall progress.  She has been started on tamoxifen and tolerating that well.  I have asked to see her back in 4 to 5 months for follow-up.  Patient knows to call anytime with any concerns.  I would like to take this opportunity to thank you for allowing me to participate in the care of your patient.Noreene Filbert, MD

## 2019-03-20 NOTE — Telephone Encounter (Signed)
Did not reach Penny Chase, left voicemails about her genetic counseling appointment 7/30.

## 2019-05-22 ENCOUNTER — Other Ambulatory Visit: Payer: Self-pay | Admitting: Internal Medicine

## 2019-06-11 ENCOUNTER — Other Ambulatory Visit: Payer: Self-pay | Admitting: Internal Medicine

## 2019-07-06 ENCOUNTER — Encounter: Payer: Self-pay | Admitting: *Deleted

## 2019-07-09 ENCOUNTER — Encounter: Payer: Self-pay | Admitting: Internal Medicine

## 2019-07-09 ENCOUNTER — Other Ambulatory Visit: Payer: Self-pay

## 2019-07-10 ENCOUNTER — Inpatient Hospital Stay: Payer: BLUE CROSS/BLUE SHIELD | Attending: Internal Medicine | Admitting: Internal Medicine

## 2019-07-10 DIAGNOSIS — Z170 Estrogen receptor positive status [ER+]: Secondary | ICD-10-CM | POA: Diagnosis not present

## 2019-07-10 DIAGNOSIS — C50411 Malignant neoplasm of upper-outer quadrant of right female breast: Secondary | ICD-10-CM | POA: Diagnosis not present

## 2019-07-10 NOTE — Progress Notes (Signed)
I connected with Penny Chase on 07/10/2019 at  2:00 PM EST by video enabled telemedicine visit and verified that I am speaking with the correct person using two identifiers.  I discussed the limitations, risks, security and privacy concerns of performing an evaluation and management service by telemedicine and the availability of in-person appointments. I also discussed with the patient that there may be a patient responsible charge related to this service. The patient expressed understanding and agreed to proceed.    Other persons participating in the visit and their role in the encounter: RN/medical reconciliation Patient's location: Home Provider's location: Office  Oncology History Overview Note  # JAN 2020- INVASIVE LOBULAR CARCINOMA, WITH FEATURES. pT2 (12m)psN0; (Dr.Cintron ) Stage IA; ER/PR >90%; her-2 neu-NEG.  #  RT finish May 30th; oncotype RS score- 11; 3% DR at 8 years; No adjuvant chemo  # June 1st 2020- Start Tam [pre-menopausal]  # HTN/ Covid positive [April 20867] #S/p genetic counseling-declined testing [low risk/no children]   DIAGNOSIS: Right breast cancer  STAGE:     1    ;GOALS: Cure  CURRENT/MOST RECENT THERAPY: start TAM      Carcinoma of upper-outer quadrant of right breast in female, estrogen receptor positive ([facility]  09/20/2018 Initial Diagnosis   Carcinoma of upper-outer quadrant of right breast in female, estrogen receptor positive ([facility]      Chief Complaint: breast cancer    History of present illness:Penny Chase 48y.o.  female with history of stage I breast cancer ER/PR positive HER-2 negative currently on tamoxifen is here for follow-up.  Patient noted to have discomfort in the right underarm related with right breast for the last 3 months.  Chronic/present all the time 6-7 on scale of 10.  Not worse with movement.  Denies any lumps or bumps.  Denies any swelling of the arm.  Hot flashes improved.  Intermittent.  Continues with  Celexa.  Observation/objective:  Assessment and plan: Carcinoma of upper-outer quadrant of right breast in female, estrogen receptor positive ([facility] # Stage IA ER PR positive HER-2/neu negative.;  Oncotype low risk - on Tamoxifen.  Stable.  # Continue tamoxifen; tolerating well with mild to moderate side effects [see below].  Defer to surgery regarding mammogram.  # right under arm/ right breast discomfort- question secondary to surgery scarring-neuropathic. Declines clinic visit.  If not improved patient will call u[place] 1 month.  Recommend Tylenol as needed for now.  # Mood swings/anxiety/hot flashes-secondary to tamoxifen-improved.  Continue celexa.   # Genetics-age less than 560 no significant family history; no children-s/p evaluation with genetics.  Declined genetics testing.   # DISPOSITION: # Follow up  4 month- MD; no labs- Dr.B    Follow-up instructions:  I discussed the assessment and treatment plan with the patient.  The patient was provided an opportunity to ask questions and all were answered.  The patient agreed with the plan and demonstrated understanding of instructions.  The patient was advised to call back or seek an in person evaluation if the symptoms worsen or if the condition fails to improve as anticipated.   Dr. GCharlaine DaltonCHCC at [facility]11/25/2020 8:00 AM

## 2019-07-10 NOTE — Assessment & Plan Note (Addendum)
#  Stage IA ER PR positive HER-2/neu negative.;  Oncotype low risk - on Tamoxifen.  Stable.  # Continue tamoxifen; tolerating well with mild to moderate side effects [see below].  Defer to surgery regarding mammogram.  # right under arm/ right breast discomfort- question secondary to surgery scarring-neuropathic. Declines clinic visit.  If not improved patient will call us in 1 month.  Recommend Tylenol as needed for now.  # Mood swings/anxiety/hot flashes-secondary to tamoxifen-improved.  Continue celexa.   # Genetics-age less than 2; no significant family history; no children-s/p evaluation with genetics.  Declined genetics testing.   # DISPOSITION: # Follow up  4 month- MD; no labs- Dr.B

## 2019-08-23 ENCOUNTER — Other Ambulatory Visit: Payer: Self-pay | Admitting: General Surgery

## 2019-08-23 DIAGNOSIS — Z853 Personal history of malignant neoplasm of breast: Secondary | ICD-10-CM

## 2019-08-27 ENCOUNTER — Ambulatory Visit: Payer: BLUE CROSS/BLUE SHIELD | Attending: Radiation Oncology | Admitting: Radiation Oncology

## 2019-08-30 ENCOUNTER — Other Ambulatory Visit: Payer: Self-pay | Admitting: *Deleted

## 2019-08-30 MED ORDER — TAMOXIFEN CITRATE 20 MG PO TABS: 20.0000 mg | ORAL_TABLET | Freq: Every day | ORAL | 3 refills | Status: DC

## 2019-08-30 NOTE — Telephone Encounter (Signed)
Patient called requesting refill. She has been compliant with appointments. Order pended.

## 2019-09-19 ENCOUNTER — Other Ambulatory Visit: Payer: BLUE CROSS/BLUE SHIELD

## 2019-09-27 ENCOUNTER — Ambulatory Visit
Admission: RE | Admit: 2019-09-27 | Discharge: 2019-09-27 | Disposition: A | Discharge status: Home / Self Care | Payer: BLUE CROSS/BLUE SHIELD | Source: Ambulatory Visit | Attending: General Surgery | Admitting: General Surgery

## 2019-09-27 DIAGNOSIS — Z853 Personal history of malignant neoplasm of breast: Secondary | ICD-10-CM | POA: Insufficient documentation

## 2019-10-18 ENCOUNTER — Other Ambulatory Visit: Payer: Self-pay | Admitting: Oncology

## 2019-11-05 ENCOUNTER — Other Ambulatory Visit: Payer: Self-pay

## 2019-11-06 ENCOUNTER — Encounter: Payer: Self-pay | Admitting: Internal Medicine

## 2019-11-06 ENCOUNTER — Inpatient Hospital Stay: Payer: BLUE CROSS/BLUE SHIELD | Attending: Internal Medicine | Admitting: Internal Medicine

## 2019-11-06 DIAGNOSIS — N951 Menopausal and female climacteric states: Secondary | ICD-10-CM | POA: Diagnosis not present

## 2019-11-06 DIAGNOSIS — Z170 Estrogen receptor positive status [ER+]: Secondary | ICD-10-CM | POA: Insufficient documentation

## 2019-11-06 DIAGNOSIS — F17210 Nicotine dependence, cigarettes, uncomplicated: Secondary | ICD-10-CM | POA: Insufficient documentation

## 2019-11-06 DIAGNOSIS — C50411 Malignant neoplasm of upper-outer quadrant of right female breast: Secondary | ICD-10-CM | POA: Diagnosis not present

## 2019-11-06 DIAGNOSIS — F419 Anxiety disorder, unspecified: Secondary | ICD-10-CM | POA: Insufficient documentation

## 2019-11-06 NOTE — Assessment & Plan Note (Addendum)
#  Stage IA ER PR positive HER-2/neu negative.;  Oncotype low risk - on Tamoxifen.  STABLE.   # Continue tamoxifen; tolerating well with mild to moderate side effects.   # Mood swings/anxiety/hot flashes-secondary to tamoxifen- STABLE; on  celexa.   # # I discussed regarding Covid-19 precautions.  I reviewed the vaccine effectiveness and potential side effects in detail.  Also discussed long-term effectiveness and safety profile are unclear at this time.  I discussed December, 2020 ASCO position statement-that all patients are recommended COVID-19 vaccinations [when available]-as long as they do not have allergy to components of the vaccine.  However, I think the benefits of the vaccination outweigh the potential risks. Re: KSMMO-06 vaccination. Pt is reluctant.   # DISPOSITION: # Follow up 6 month- MD; no labs- Dr.B

## 2019-11-06 NOTE — Progress Notes (Signed)
[name] CONSULT NOTE  Patient Care Team: Anderson, Marshall W, MD as PCP - General (Internal Medicine)  CHIEF COMPLAINTS/PURPOSE OF CONSULTATION:  Breast cancer  #  Oncology History Overview Note  # JAN 2020- INVASIVE LOBULAR CARCINOMA, WITH FEATURES. pT2 (26mm)psN0; (Dr.Cintron ) Stage IA; ER/PR >90%; her-2 neu-NEG.  #  RT finish May 30th; oncotype RS score- 11; 3% DR at 8 years; No adjuvant chemo  # June 1st 2020- Start Tam [pre-menopausal]  # HTN/ Covid positive [April 2020]  #S/p genetic counseling-declined testing [low risk/no children]   DIAGNOSIS: Right breast cancer  STAGE:     1    ;GOALS: Cure  CURRENT/MOST RECENT THERAPY: on TAM      Carcinoma of upper-outer quadrant of right breast in female, estrogen receptor positive ([facility])  09/20/2018 Initial Diagnosis   Carcinoma of upper-outer quadrant of right breast in female, estrogen receptor positive ([facility])      HISTORY OF PRESENTING ILLNESS:  Penny Chase 49 y.o.  female  Stage I ER/PR pos her 2 Neg Breast cancer on tamoxifen is here for a follow up.  Patient's hot flashes are improved.  Patient arthritis improved.  No nausea no vomiting.  No headaches.    Review of Systems  Constitutional: Negative for chills, diaphoresis, fever, malaise/fatigue and weight loss.  HENT: Negative for nosebleeds and sore throat.   Eyes: Negative for double vision.  Respiratory: Negative for cough, hemoptysis, sputum production, shortness of breath and wheezing.   Cardiovascular: Negative for chest pain, palpitations, orthopnea and leg swelling.  Gastrointestinal: Negative for abdominal pain, blood in stool, constipation, diarrhea, heartburn, melena, nausea and vomiting.  Musculoskeletal: Negative for back pain and joint pain.  Skin: Negative.  Negative for itching and rash.  Neurological: Negative for dizziness, tingling, focal weakness, weakness and headaches.  Endo/Heme/Allergies: Does not bruise/bleed easily.   Psychiatric/Behavioral: Negative for depression. The patient is not nervous/anxious and does not have insomnia.      MEDICAL HISTORY:  Past Medical History:  Diagnosis Date  . Cancer (HCC) 09/2018   Right breast  . Family history of breast cancer   . Hypertension   . Personal history of radiation therapy 2020   Rt breast    SURGICAL HISTORY: Past Surgical History:  Procedure Laterality Date  . BREAST BIOPSY Right 09/12/2018   us core/"venus clip"-INVASIVE MAMMARYLobular CARCINOMA, WITH LOBULAR FEATURES  . BREAST LUMPECTOMY Right 10/04/2018  . BREAST LUMPECTOMY WITH NEEDLE LOCALIZATION AND AXILLARY SENTINEL LYMPH NODE BX Right 10/04/2018   Procedure: BREAST LUMPECTOMY WITH NEEDLE LOCALIZATION AND AXILLARY SENTINEL LYMPH NODE BX;  Surgeon: Cintron-Diaz, Edgardo, MD;  Location: [facility] ORS;  Service: General;  Laterality: Right;  . TONSILLECTOMY      SOCIAL HISTORY: Social History   Socioeconomic History  . Marital status: Married    Spouse name: Not on file  . Number of children: Not on file  . Years of education: Not on file  . Highest education level: Not on file  Occupational History  . Not on file  Tobacco Use  . Smoking status: Current Every Day Smoker  . Smokeless tobacco: Never Used  Substance and Sexual Activity  . Alcohol use: Yes    Comment: OCCAS  . Drug use: No  . Sexual activity: Not on file  Other Topics Concern  . Not on file  Social History Narrative   Works at [employer];  Smoke 15 cig/day; No alcohol abuse; [place]; with husband.    Social   Determinants of Health   Financial Resource Strain:   . Difficulty of Paying Living Expenses:   Food Insecurity:   . Worried About Charity fundraiser in the Last Year:   . Arboriculturist in the Last Year:   Transportation Needs:   . Film/video editor (Medical):   Marland Kitchen Lack of Transportation (Non-Medical):   Physical Activity:   . Days of Exercise per Week:   . Minutes of Exercise  per Session:   Stress:   . Feeling of Stress :   Social Connections:   . Frequency of Communication with Friends and Family:   . Frequency of Social Gatherings with Friends and Family:   . Attends Religious Services:   . Active Member of Clubs or Organizations:   . Attends Archivist Meetings:   Marland Kitchen Marital Status:   Intimate Partner Violence:   . Fear of Current or Ex-Partner:   . Emotionally Abused:   Marland Kitchen Physically Abused:   . Sexually Abused:     FAMILY HISTORY: Family History  Problem Relation Age of Onset  . Breast cancer Paternal Aunt     ALLERGIES:  has No Known Allergies.  MEDICATIONS:  Current Outpatient Medications  Medication Sig Dispense Refill  . acetaminophen (TYLENOL) 500 MG tablet Take 500 mg by mouth every 6 (six) hours as needed for moderate pain.    Marland Kitchen amLODipine (NORVASC) 5 MG tablet Take 5 mg by mouth daily.    Marland Kitchen aspirin-sod bicarb-citric acid (ALKA-SELTZER) 325 MG TBEF tablet Take 325 mg by mouth every 6 (six) hours as needed (indigestion).    . citalopram (CELEXA) 20 MG tablet TAKE 1 TABLET BY MOUTH EVERY DAY 30 tablet 3  . pantoprazole (PROTONIX) 40 MG tablet Take 40 mg by mouth daily as needed (heartburn).     . tamoxifen (NOLVADEX) 20 MG tablet Take 1 tablet (20 mg total) by mouth daily. 90 tablet 3   No current facility-administered medications for this visit.      Marland Kitchen  PHYSICAL EXAMINATION: ECOG PERFORMANCE STATUS: 0 - Asymptomatic  Vitals:   11/06/19 1320  BP: 122/81  Pulse: 73  Temp: (!) 97.2 F (36.2 C)   Filed Weights   11/06/19 1320  Weight: 170 lb 8 oz (77.3 kg)    Physical Exam  Constitutional: She is oriented to person, place, and time and well-developed, well-nourished, and in no distress.  HENT:  Head: Normocephalic and atraumatic.  Mouth/Throat: Oropharynx is clear and moist. No oropharyngeal exudate.  Eyes: Pupils are equal, round, and reactive to light.  Cardiovascular: Normal rate and regular rhythm.   Pulmonary/Chest: No respiratory distress. She has no wheezes.  Abdominal: Soft. Bowel sounds are normal. She exhibits no distension and no mass. There is no abdominal tenderness. There is no rebound and no guarding.  Musculoskeletal:        General: No tenderness or edema. Normal range of motion.     Cervical back: Normal range of motion and neck supple.  Neurological: She is alert and oriented to person, place, and time.  Skin: Skin is warm.  Psychiatric: Affect normal.     LABORATORY DATA:  I have reviewed the data as listed Lab Results  Component Value Date   WBC 13.0 (H) 05/31/2016   HGB 15.6 05/31/2016   HCT 45.6 05/31/2016   MCV 92.7 05/31/2016   PLT 257 05/31/2016   No results for input(s): NA, K, CL, CO2, GLUCOSE, BUN, CREATININE, CALCIUM, GFRNONAA, GFRAA, PROT, ALBUMIN, AST,  ALT, ALKPHOS, BILITOT, BILIDIR, IBILI in the last 8760 hours.  RADIOGRAPHIC STUDIES: I have personally reviewed the radiological images as listed and agreed with the findings in the report. No results found.  ASSESSMENT & PLAN:   Carcinoma of upper-outer quadrant of right breast in female, estrogen receptor positive (HCC) # Stage IA ER PR positive HER-2/neu negative.;  Oncotype low risk - on Tamoxifen.  STABLE.   # Continue tamoxifen; tolerating well with mild to moderate side effects.   # Mood swings/anxiety/hot flashes-secondary to tamoxifen- STABLE; on  celexa.   # # I discussed regarding Covid-19 precautions.  I reviewed the vaccine effectiveness and potential side effects in detail.  Also discussed long-term effectiveness and safety profile are unclear at this time.  I discussed December, 2020 ASCO position statement-that all patients are recommended COVID-19 vaccinations [when available]-as long as they do not have allergy to components of the vaccine.  However, I think the benefits of the vaccination outweigh the potential risks. Re: COVID-19 vaccination. Pt is reluctant.   #  DISPOSITION: # Follow up 6 month- MD; no labs- Dr.B  All questions were answered. The patient knows to call the clinic with any problems, questions or concerns.     R , MD 11/06/2019 3:07 PM    

## 2020-01-24 DIAGNOSIS — Z124 Encounter for screening for malignant neoplasm of cervix: Secondary | ICD-10-CM | POA: Diagnosis not present

## 2020-01-24 DIAGNOSIS — N952 Postmenopausal atrophic vaginitis: Secondary | ICD-10-CM | POA: Diagnosis not present

## 2020-01-24 DIAGNOSIS — R87610 Atypical squamous cells of undetermined significance on cytologic smear of cervix (ASC-US): Secondary | ICD-10-CM | POA: Diagnosis not present

## 2020-02-16 ENCOUNTER — Other Ambulatory Visit: Payer: Self-pay | Admitting: Internal Medicine

## 2020-05-06 ENCOUNTER — Encounter: Payer: Self-pay | Admitting: Internal Medicine

## 2020-05-06 ENCOUNTER — Other Ambulatory Visit: Payer: Self-pay

## 2020-05-06 ENCOUNTER — Inpatient Hospital Stay: Payer: 59 | Attending: Internal Medicine | Admitting: Internal Medicine

## 2020-05-06 DIAGNOSIS — F17200 Nicotine dependence, unspecified, uncomplicated: Secondary | ICD-10-CM | POA: Insufficient documentation

## 2020-05-06 DIAGNOSIS — C50411 Malignant neoplasm of upper-outer quadrant of right female breast: Secondary | ICD-10-CM | POA: Diagnosis not present

## 2020-05-06 DIAGNOSIS — R69 Illness, unspecified: Secondary | ICD-10-CM | POA: Diagnosis not present

## 2020-05-06 DIAGNOSIS — N951 Menopausal and female climacteric states: Secondary | ICD-10-CM | POA: Diagnosis not present

## 2020-05-06 DIAGNOSIS — Z170 Estrogen receptor positive status [ER+]: Secondary | ICD-10-CM

## 2020-05-06 DIAGNOSIS — F419 Anxiety disorder, unspecified: Secondary | ICD-10-CM | POA: Insufficient documentation

## 2020-05-06 NOTE — Progress Notes (Signed)
[facility] NOTE  Patient Care Team: Kirk Ruths, MD as PCP - General (Internal Medicine)  CHIEF COMPLAINTS/PURPOSE OF CONSULTATION:  Breast cancer  #  Oncology History Overview Note  # JAN 2020- INVASIVE LOBULAR CARCINOMA, WITH FEATURES. pT2 (44m)psN0; (Dr.Cintron ) Stage IA; ER/PR >90%; her-2 neu-NEG.  #  RT finish May 30th; oncotype RS score- 11; 3% DR at 8 years; No adjuvant chemo  # June 1st 2020- Start Tam [pre-menopausal]  # HTN/ Covid positive [April 20623] #S/p genetic counseling-declined testing [low risk/no children]   DIAGNOSIS: Right breast cancer  STAGE:     1    ;GOALS: Cure  CURRENT/MOST RECENT THERAPY: on TAM      Carcinoma of upper-outer quadrant of right breast in female, estrogen receptor positive ([facility]  09/20/2018 Initial Diagnosis   Carcinoma of upper-outer quadrant of right breast in female, estrogen receptor positive ([facility]      HISTORY OF PRESENTING ILLNESS:  JTeressa Senter89y.o.  female  Stage I ER/PR pos her 2 Neg Breast cancer on tamoxifen is here for a follow up.  Complains of mild fatigue.  Hot flashes improved on Celexa.  Complains of mild arthritis in her joints/muscle aches.  Also complains of foot pain left heel.  No trauma.  No vaginal discharge no vaginal bleeding.  S/p recent gynecology evaluation with Dr. BLeafy Ro  Review of Systems  Constitutional: Negative for chills, diaphoresis, fever, malaise/fatigue and weight loss.  HENT: Negative for nosebleeds and sore throat.   Eyes: Negative for double vision.  Respiratory: Negative for cough, hemoptysis, sputum production, shortness of breath and wheezing.   Cardiovascular: Negative for chest pain, palpitations, orthopnea and leg swelling.  Gastrointestinal: Negative for abdominal pain, blood in stool, constipation, diarrhea, heartburn, melena, nausea and vomiting.  Musculoskeletal: Negative for back pain and joint pain.  Skin: Negative.  Negative for  itching and rash.  Neurological: Negative for dizziness, tingling, focal weakness, weakness and headaches.  Endo/Heme/Allergies: Does not bruise/bleed easily.  Psychiatric/Behavioral: Negative for depression. The patient is not nervous/anxious and does not have insomnia.      MEDICAL HISTORY:  Past Medical History:  Diagnosis Date  . Cancer ([facility] 09/2018   Right breast  . Family history of breast cancer   . Hypertension   . Personal history of radiation therapy 2020   Rt breast    SURGICAL HISTORY: Past Surgical History:  Procedure Laterality Date  . BREAST BIOPSY Right 09/12/2018   [facility]/"venus clip"-INVASIVE MAMMARYLobular CARCINOMA, WITH LOBULAR FEATURES  . BREAST LUMPECTOMY Right 10/04/2018  . BREAST LUMPECTOMY WITH NEEDLE LOCALIZATION AND AXILLARY SENTINEL LYMPH NODE BX Right 10/04/2018   Procedure: BREAST LUMPECTOMY WITH NEEDLE LOCALIZATION AND AXILLARY SENTINEL LYMPH NODE BX;  Surgeon: CHerbert Pun MD;  Location: [facility] ORS;  Service: General;  Laterality: Right;  . TONSILLECTOMY      SOCIAL HISTORY: Social History   Socioeconomic History  . Marital status: Married    Spouse name: Not on file  . Number of children: Not on file  . Years of education: Not on file  . Highest education level: Not on file  Occupational History  . Not on file  Tobacco Use  . Smoking status: Current Every Day Smoker  . Smokeless tobacco: Never Used  Vaping Use  . Vaping Use: Former  Substance and Sexual Activity  . Alcohol use: Yes    Comment: OCCAS  . Drug use: No  . Sexual activity: Not on file  Other  Topics Concern  . Not on file  Social History Narrative   Works at [employer];  Smoke 15 cig/day; No alcohol abuse; in [employer]; with husband.    Social Determinants of Health   Financial Resource Strain:   . Difficulty of Paying Living Expenses: Not on file  Food Insecurity:   . Worried About Charity fundraiser in the Last Year: Not on file  .  Ran Out of Food in the Last Year: Not on file  Transportation Needs:   . Lack of Transportation (Medical): Not on file  . Lack of Transportation (Non-Medical): Not on file  Physical Activity:   . Days of Exercise per Week: Not on file  . Minutes of Exercise per Session: Not on file  Stress:   . Feeling of Stress : Not on file  Social Connections:   . Frequency of Communication with Friends and Family: Not on file  . Frequency of Social Gatherings with Friends and Family: Not on file  . Attends Religious Services: Not on file  . Active Member of Clubs or Organizations: Not on file  . Attends Archivist Meetings: Not on file  . Marital Status: Not on file  Intimate Partner Violence:   . Fear of Current or Ex-Partner: Not on file  . Emotionally Abused: Not on file  . Physically Abused: Not on file  . Sexually Abused: Not on file    FAMILY HISTORY: Family History  Problem Relation Age of Onset  . Breast cancer Paternal Aunt     ALLERGIES:  has No Known Allergies.  MEDICATIONS:  Current Outpatient Medications  Medication Sig Dispense Refill  . acetaminophen (TYLENOL) 500 MG tablet Take 500 mg by mouth every 6 (six) hours as needed for moderate pain.    Marland Kitchen amLODipine (NORVASC) 5 MG tablet Take 5 mg by mouth daily.    Marland Kitchen aspirin-sod bicarb-citric acid (ALKA-SELTZER) 325 MG TBEF tablet Take 325 mg by mouth every 6 (six) hours as needed (indigestion).    . citalopram (CELEXA) 20 MG tablet TAKE 1 TABLET BY MOUTH EVERY DAY 30 tablet 3  . pantoprazole (PROTONIX) 40 MG tablet Take 40 mg by mouth daily as needed (heartburn).     . tamoxifen (NOLVADEX) 20 MG tablet Take 1 tablet (20 mg total) by mouth daily. 90 tablet 3   No current facility-administered medications for this visit.      Marland Kitchen  PHYSICAL EXAMINATION: ECOG PERFORMANCE STATUS: 0 - Asymptomatic  Vitals:   05/06/20 1335  BP: 121/75  Pulse: 68  Resp: 16  Temp: 98.9 F (37.2 C)  SpO2: 98%   Filed Weights    05/06/20 1335  Weight: 171 lb 3.2 oz (77.7 kg)    Physical Exam HENT:     Head: Normocephalic and atraumatic.     Mouth/Throat:     Pharynx: No oropharyngeal exudate.  Eyes:     Pupils: Pupils are equal, round, and reactive to light.  Cardiovascular:     Rate and Rhythm: Normal rate and regular rhythm.  Pulmonary:     Effort: No respiratory distress.     Breath sounds: No wheezing.  Abdominal:     General: Bowel sounds are normal. There is no distension.     Palpations: Abdomen is soft. There is no mass.     Tenderness: There is no abdominal tenderness. There is no guarding or rebound.  Musculoskeletal:        General: No tenderness. Normal range of motion.  Cervical back: Normal range of motion and neck supple.  Skin:    General: Skin is warm.  Neurological:     Mental Status: She is alert and oriented to person, place, and time.  Psychiatric:        Mood and Affect: Affect normal.      LABORATORY DATA:  I have reviewed the data as listed Lab Results  Component Value Date   WBC 13.0 (H) 05/31/2016   HGB 15.6 05/31/2016   HCT 45.6 05/31/2016   MCV 92.7 05/31/2016   PLT 257 05/31/2016   No results for input(s): NA, K, CL, CO2, GLUCOSE, BUN, CREATININE, CALCIUM, GFRNONAA, GFRAA, PROT, ALBUMIN, AST, ALT, ALKPHOS, BILITOT, BILIDIR, IBILI in the last 8760 hours.  RADIOGRAPHIC STUDIES: I have personally reviewed the radiological images as listed and agreed with the findings in the report. No results found.  ASSESSMENT & PLAN:   Carcinoma of upper-outer quadrant of right breast in female, estrogen receptor positive ([place]) # Stage IA ER PR positive HER-2/neu negative.;  Oncotype low risk - on Tamoxifen.  STABLE; mammogram Feb 2021- WNL.Marland Kitchen    # Continue tamoxifen; tolerating well with mild to moderate side effects.   # Myalgias/ fatigue-G-1- recommend ca+vit D BID.  We will recheck vitamin D levels at next visit.  # Mood swings/anxiety/hot flashes-secondary to  tamoxifen- STABLE; on  celexa.   # Patient is to unvaccinated to [place]. Initially,concerned about potential adverse events.  However given the recent Covid related illness of her friend, patient is willing to take a vaccine.  Again encouraged the patient to take the vaccine ASAP.     # DISPOSITION: # Follow up 6 month- MD; labs- cbc/cmp/vit D level- Dr.B  All questions were answered. The patient knows to call the clinic with any problems, questions or concerns.    Cammie Sickle, MD 05/06/2020 4:03 PM

## 2020-05-06 NOTE — Progress Notes (Signed)
Bilateral feet pain x4 months. Left foot is worse. No numbness or tingling

## 2020-05-06 NOTE — Assessment & Plan Note (Addendum)
#  Stage IA ER PR positive HER-2/neu negative.;  Oncotype low risk - on Tamoxifen.  STABLE; mammogram Feb 2021- WNL.Marland Kitchen    # Continue tamoxifen; tolerating well with mild to moderate side effects.   # Myalgias/ fatigue-G-1- recommend ca+vit D BID.  We will recheck vitamin D levels at next visit.  # Mood swings/anxiety/hot flashes-secondary to tamoxifen- STABLE; on  celexa.   # Patient is to unvaccinated to [place]. Initially,concerned about potential adverse events.  However given the recent Covid related illness of her friend, patient is willing to take a vaccine.  Again encouraged the patient to take the vaccine ASAP.     # DISPOSITION: # Follow up 6 month- MD; labs- cbc/cmp/vit D level- Dr.B

## 2020-06-29 ENCOUNTER — Other Ambulatory Visit: Payer: Self-pay | Admitting: Internal Medicine

## 2020-07-17 DIAGNOSIS — I10 Essential (primary) hypertension: Secondary | ICD-10-CM | POA: Diagnosis not present

## 2020-07-17 DIAGNOSIS — Z0000 Encounter for general adult medical examination without abnormal findings: Secondary | ICD-10-CM | POA: Diagnosis not present

## 2020-08-14 DIAGNOSIS — Z1211 Encounter for screening for malignant neoplasm of colon: Secondary | ICD-10-CM | POA: Diagnosis not present

## 2020-08-19 ENCOUNTER — Other Ambulatory Visit: Payer: Self-pay | Admitting: General Surgery

## 2020-08-19 DIAGNOSIS — Z853 Personal history of malignant neoplasm of breast: Secondary | ICD-10-CM

## 2020-08-25 LAB — EXTERNAL GENERIC LAB PROCEDURE: COLOGUARD: NEGATIVE

## 2020-08-25 LAB — COLOGUARD: COLOGUARD: NEGATIVE

## 2020-08-28 ENCOUNTER — Other Ambulatory Visit: Payer: Self-pay | Admitting: Internal Medicine

## 2020-09-10 DIAGNOSIS — Z20822 Contact with and (suspected) exposure to covid-19: Secondary | ICD-10-CM | POA: Diagnosis not present

## 2020-09-10 DIAGNOSIS — J40 Bronchitis, not specified as acute or chronic: Secondary | ICD-10-CM | POA: Diagnosis not present

## 2020-09-10 DIAGNOSIS — J019 Acute sinusitis, unspecified: Secondary | ICD-10-CM | POA: Diagnosis not present

## 2020-09-30 ENCOUNTER — Other Ambulatory Visit: Payer: Self-pay

## 2020-09-30 ENCOUNTER — Ambulatory Visit
Admission: RE | Admit: 2020-09-30 | Discharge: 2020-09-30 | Disposition: A | Discharge status: Home / Self Care | Payer: 59 | Source: Ambulatory Visit | Attending: General Surgery | Admitting: General Surgery

## 2020-09-30 DIAGNOSIS — Z853 Personal history of malignant neoplasm of breast: Secondary | ICD-10-CM

## 2020-09-30 DIAGNOSIS — R922 Inconclusive mammogram: Secondary | ICD-10-CM | POA: Diagnosis not present

## 2020-09-30 HISTORY — DX: Malignant neoplasm of unspecified site of unspecified female breast: C50.919

## 2020-10-04 IMAGING — MG DIGITAL SCREENING BILATERAL MAMMOGRAM WITH TOMO AND CAD
8 series · 8 of 24 positions shown · non-contrast
Comparison: Previous exam(s).

CLINICAL DATA: Screening.

EXAM:
DIGITAL SCREENING BILATERAL MAMMOGRAM WITH TOMO AND CAD

[R MLO synth-2D]
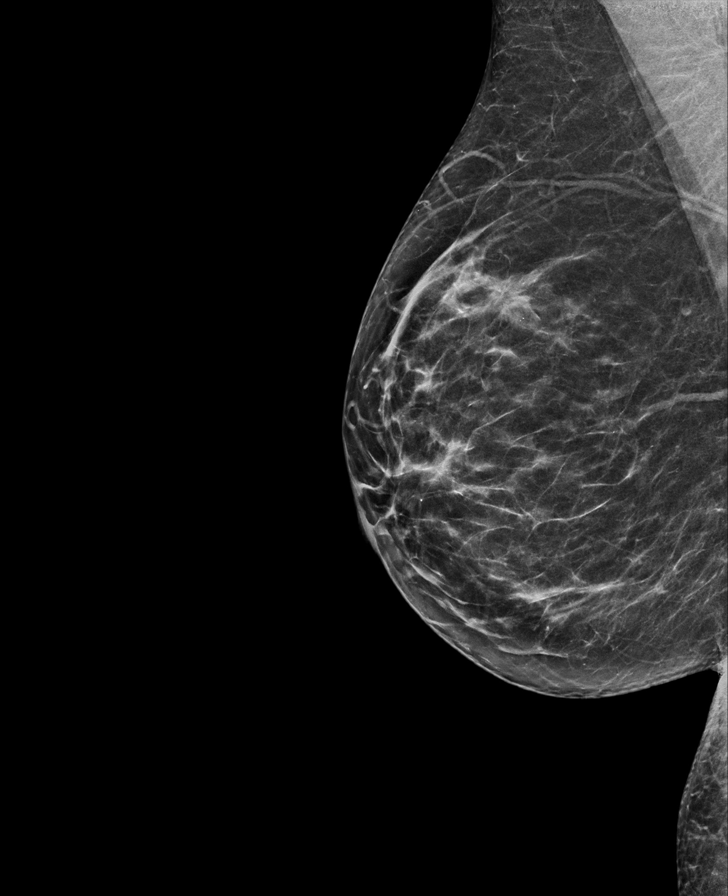

[L MLO synth-2D]
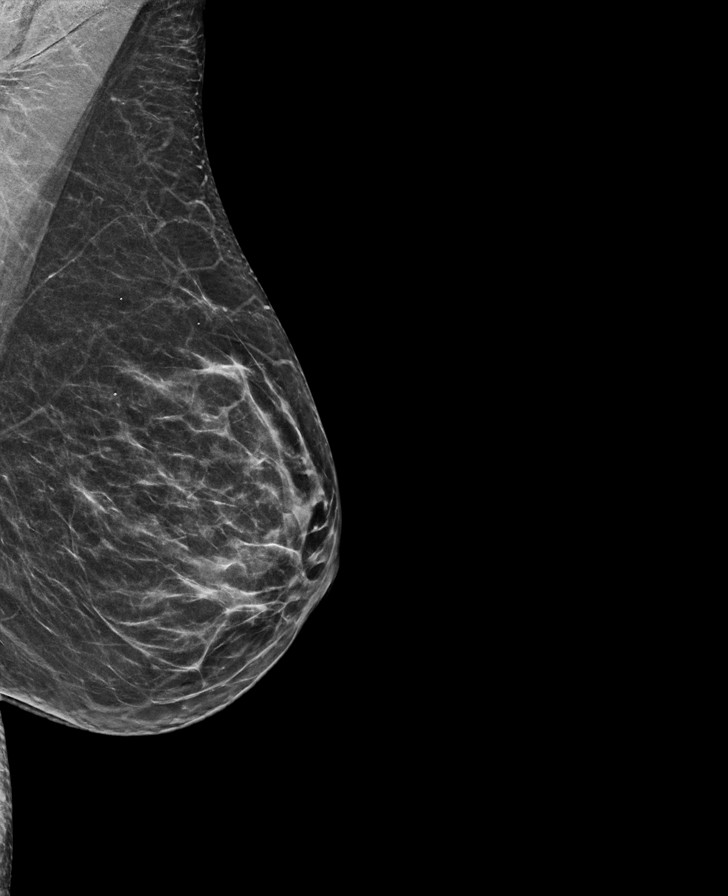

[L CC synth-2D]
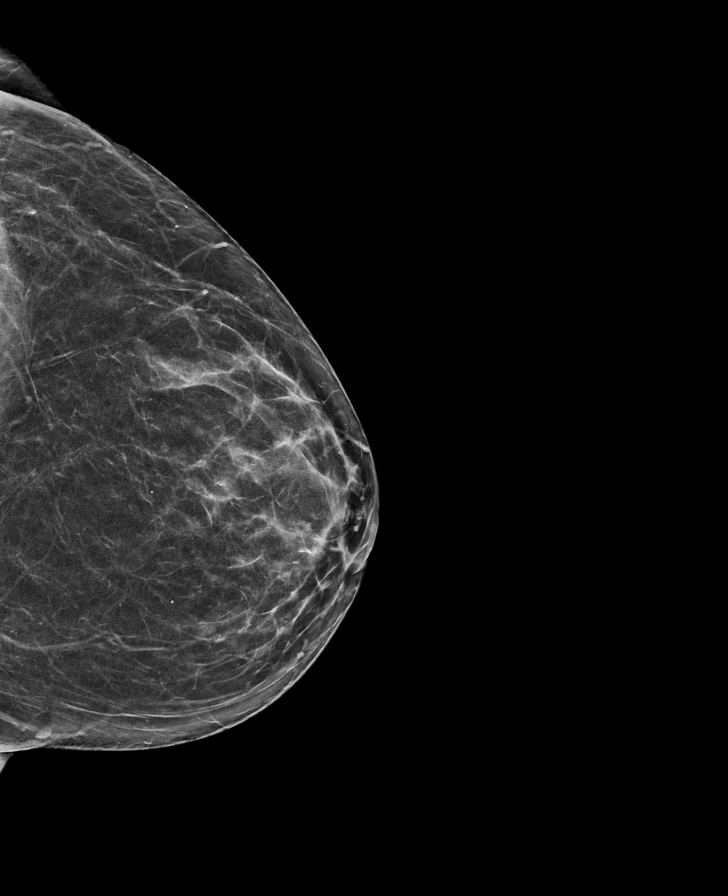

[R CC synth-2D]
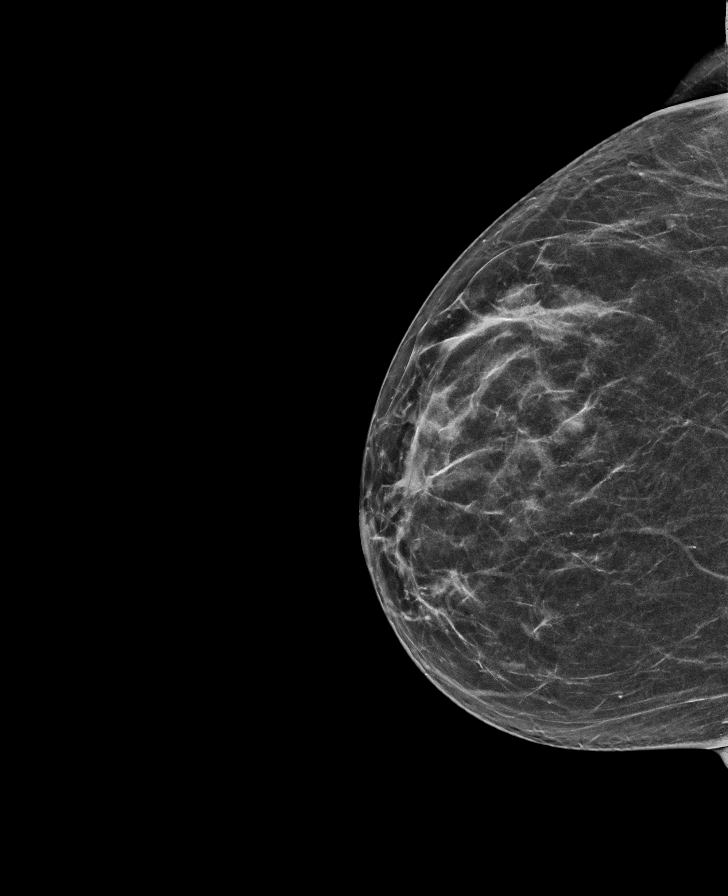

[R CC tomo · tomo slice 31/62.0]
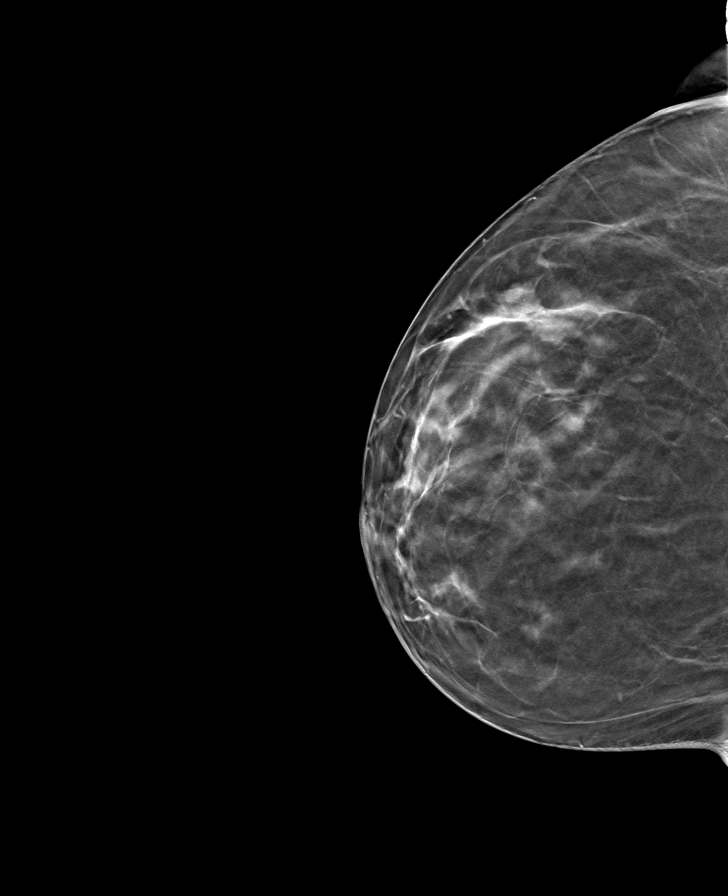

[R MLO tomo · tomo slice 33/64.0]
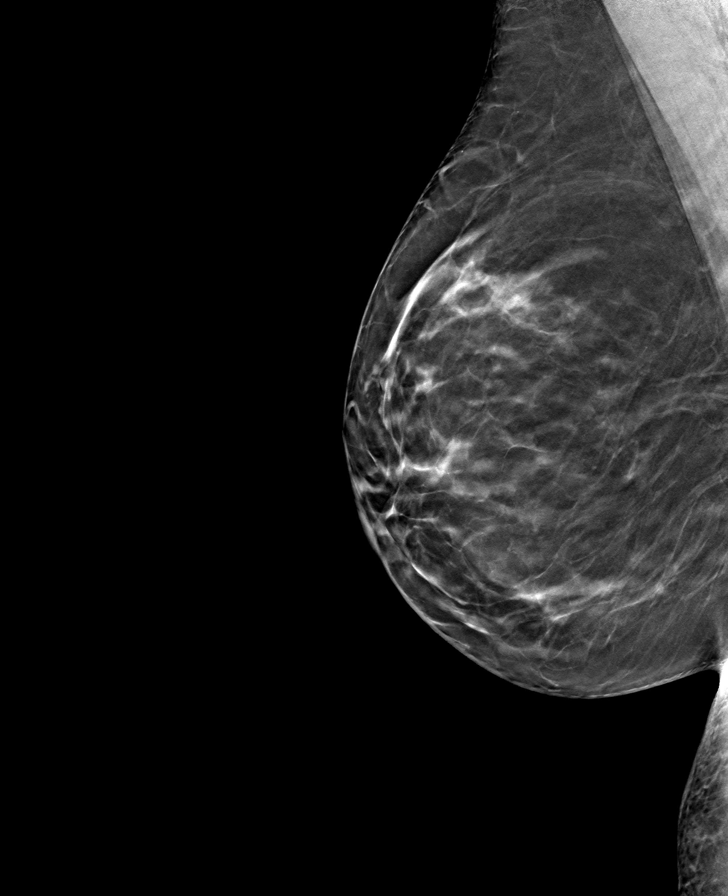

[L MLO tomo · tomo slice 31/60.0]
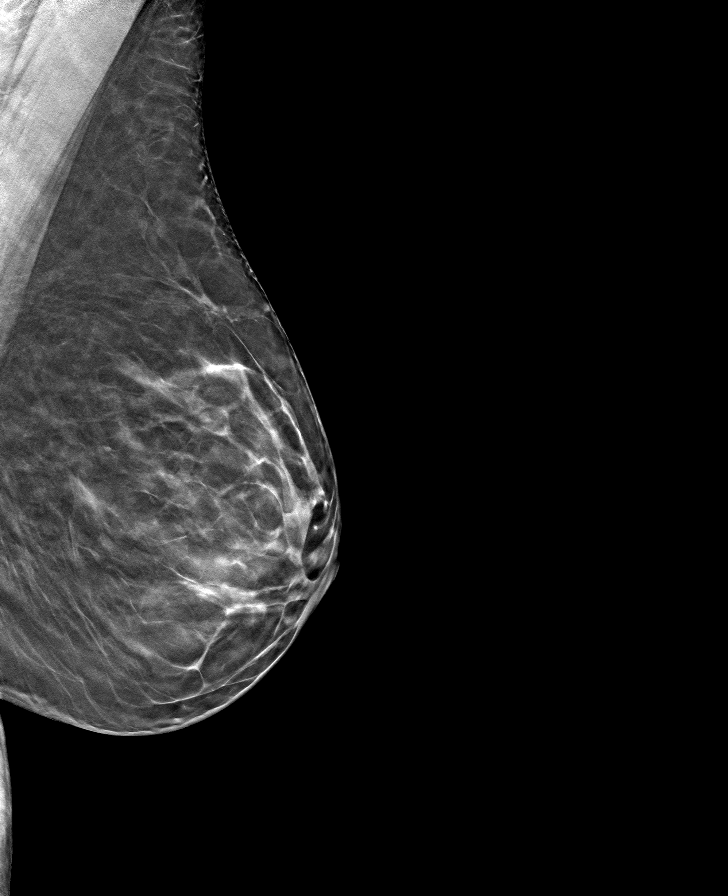

[L CC tomo · tomo slice 31/61.0]
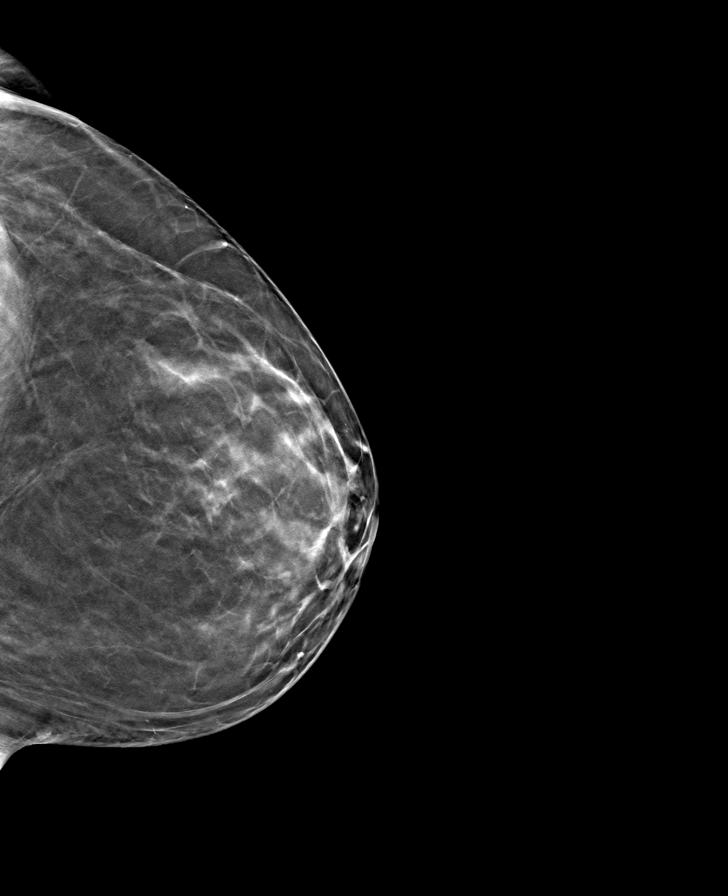

[8 of 24 positions shown; findings below may reference images not displayed]

ACR Breast Density Category b: There are scattered areas of
fibroglandular density.
FINDINGS: In the right breast, possible distortion warrants further
evaluation. In the left breast, no findings suspicious for
malignancy. Images were processed with CAD.
IMPRESSION: Further evaluation is suggested for possible distortion in the right
breast.

RECOMMENDATION:
Diagnostic mammogram and possibly ultrasound of the right breast.
(Code:[id number])

The patient will be contacted regarding the findings, and additional
imaging will be scheduled.

BI-RADS CATEGORY  0: Incomplete. Need additional imaging evaluation
and/or prior mammograms for comparison.

## 2020-10-16 DIAGNOSIS — M25551 Pain in right hip: Secondary | ICD-10-CM | POA: Diagnosis not present

## 2020-10-16 DIAGNOSIS — S73191A Other sprain of right hip, initial encounter: Secondary | ICD-10-CM | POA: Diagnosis not present

## 2020-10-16 DIAGNOSIS — M7062 Trochanteric bursitis, left hip: Secondary | ICD-10-CM | POA: Diagnosis not present

## 2020-10-31 ENCOUNTER — Other Ambulatory Visit: Payer: Self-pay

## 2020-10-31 ENCOUNTER — Inpatient Hospital Stay: Payer: 59 | Attending: Internal Medicine

## 2020-10-31 DIAGNOSIS — F17200 Nicotine dependence, unspecified, uncomplicated: Secondary | ICD-10-CM | POA: Insufficient documentation

## 2020-10-31 DIAGNOSIS — Z170 Estrogen receptor positive status [ER+]: Secondary | ICD-10-CM | POA: Diagnosis not present

## 2020-10-31 DIAGNOSIS — R5383 Other fatigue: Secondary | ICD-10-CM | POA: Insufficient documentation

## 2020-10-31 DIAGNOSIS — M791 Myalgia, unspecified site: Secondary | ICD-10-CM | POA: Diagnosis not present

## 2020-10-31 DIAGNOSIS — C50411 Malignant neoplasm of upper-outer quadrant of right female breast: Secondary | ICD-10-CM | POA: Insufficient documentation

## 2020-10-31 DIAGNOSIS — F29 Unspecified psychosis not due to a substance or known physiological condition: Secondary | ICD-10-CM | POA: Insufficient documentation

## 2020-10-31 LAB — CBC WITH DIFFERENTIAL/PLATELET
Abs Immature Granulocytes: 0.04 10*3/uL (ref 0.00–0.07)
Basophils Absolute: 0.1 10*3/uL (ref 0.0–0.1)
Basophils Relative: 1 %
Eosinophils Absolute: 0.2 10*3/uL (ref 0.0–0.5)
Eosinophils Relative: 1 %
HCT: 42.9 % (ref 36.0–46.0)
Hemoglobin: 14.6 g/dL (ref 12.0–15.0)
Immature Granulocytes: 0 %
Lymphocytes Relative: 21 %
Lymphs Abs: 2.3 10*3/uL (ref 0.7–4.0)
MCH: 32.5 pg (ref 26.0–34.0)
MCHC: 34 g/dL (ref 30.0–36.0)
MCV: 95.5 fL (ref 80.0–100.0)
Monocytes Absolute: 0.8 10*3/uL (ref 0.1–1.0)
Monocytes Relative: 7 %
Neutro Abs: 7.7 10*3/uL (ref 1.7–7.7)
Neutrophils Relative %: 70 %
Platelets: 270 10*3/uL (ref 150–400)
RBC: 4.49 MIL/uL (ref 3.87–5.11)
RDW: 12.9 % (ref 11.5–15.5)
WBC: 11.1 10*3/uL — ABNORMAL HIGH (ref 4.0–10.5)
nRBC: 0 % (ref 0.0–0.2)

## 2020-10-31 LAB — COMPREHENSIVE METABOLIC PANEL
ALT: 12 U/L (ref 0–44)
AST: 17 U/L (ref 15–41)
Albumin: 4.4 g/dL (ref 3.5–5.0)
Alkaline Phosphatase: 73 U/L (ref 38–126)
Anion gap: 9 (ref 5–15)
BUN: 17 mg/dL (ref 6–20)
CO2: 24 mmol/L (ref 22–32)
Calcium: 8.9 mg/dL (ref 8.9–10.3)
Chloride: 106 mmol/L (ref 98–111)
Creatinine, Ser: 0.8 mg/dL (ref 0.44–1.00)
GFR, Estimated: >60 mL/min (ref >60)
Glucose, Bld: 97 mg/dL (ref 70–99)
Potassium: 3.9 mmol/L (ref 3.5–5.1)
Sodium: 139 mmol/L (ref 135–145)
Total Bilirubin: 0.5 mg/dL (ref 0.3–1.2)
Total Protein: 7.7 g/dL (ref 6.5–8.1)

## 2020-10-31 LAB — VITAMIN D 25 HYDROXY (VIT D DEFICIENCY, FRACTURES): Vit D, 25-Hydroxy: 14.79 ng/mL — ABNORMAL LOW (ref 30–100)

## 2020-11-02 ENCOUNTER — Other Ambulatory Visit: Payer: Self-pay | Admitting: Internal Medicine

## 2020-11-04 ENCOUNTER — Other Ambulatory Visit: Payer: 59

## 2020-11-04 ENCOUNTER — Encounter: Payer: Self-pay | Admitting: Internal Medicine

## 2020-11-04 ENCOUNTER — Other Ambulatory Visit: Payer: Self-pay

## 2020-11-04 ENCOUNTER — Inpatient Hospital Stay (HOSPITAL_BASED_OUTPATIENT_CLINIC_OR_DEPARTMENT_OTHER): Payer: 59 | Admitting: Internal Medicine

## 2020-11-04 VITALS — BP 125/88 | HR 81 | Temp 99.2°F | Resp 16 | Ht 65.0 in | Wt 167.0 lb

## 2020-11-04 DIAGNOSIS — R4586 Emotional lability: Secondary | ICD-10-CM

## 2020-11-04 DIAGNOSIS — C50411 Malignant neoplasm of upper-outer quadrant of right female breast: Secondary | ICD-10-CM | POA: Diagnosis not present

## 2020-11-04 DIAGNOSIS — Z170 Estrogen receptor positive status [ER+]: Secondary | ICD-10-CM | POA: Diagnosis not present

## 2020-11-04 NOTE — Progress Notes (Signed)
[facility] NOTE  Patient Care Team: Kirk Ruths, MD as PCP - General (Internal Medicine) Chauncey Mann, MD as Consulting Physician (Psychiatry)  CHIEF COMPLAINTS/PURPOSE OF CONSULTATION:  Breast cancer  #  Oncology History Overview Note  # JAN 2020- INVASIVE LOBULAR CARCINOMA, WITH FEATURES. pT2 (46m)psN0; (Dr.Cintron ) Stage IA; ER/PR >90%; her-2 neu-NEG.  #  RT finish May 30th; oncotype RS score- 11; 3% DR at 8 years; No adjuvant chemo  # June 1st 2020- Start Tam [pre-menopausal]  # HTN/ Covid positive [April 21443] #S/p genetic counseling-declined testing [low risk/no children]   DIAGNOSIS: Right breast cancer  STAGE:     1    ;GOALS: Cure  CURRENT/MOST RECENT THERAPY: on TAM      Carcinoma of upper-outer quadrant of right breast in female, estrogen receptor positive ([facility]  09/20/2018 Initial Diagnosis   Carcinoma of upper-outer quadrant of right breast in female, estrogen receptor positive ([facility]      HISTORY OF PRESENTING ILLNESS:  Penny Senter89y.o.  female  Stage I ER/PR pos her 2 Neg Breast cancer on tamoxifen is here for a follow up.  Patient complains of difficulty sleeping.  Complains of anxiety.  Complains of mood swings.  She complains of crying spells for no reason.  Denies any plan for self-harm/or harming others.  Complaints of significant joint pain/muscle pain.   Review of Systems  Constitutional: Negative for chills, diaphoresis, fever, malaise/fatigue and weight loss.  HENT: Negative for nosebleeds and sore throat.   Eyes: Negative for double vision.  Respiratory: Negative for cough, hemoptysis, sputum production, shortness of breath and wheezing.   Cardiovascular: Negative for chest pain, palpitations, orthopnea and leg swelling.  Gastrointestinal: Negative for abdominal pain, blood in stool, constipation, diarrhea, heartburn, melena, nausea and vomiting.  Musculoskeletal: Positive for back pain, joint pain  and myalgias.  Skin: Negative.  Negative for itching and rash.  Neurological: Negative for dizziness, tingling, focal weakness, weakness and headaches.  Endo/Heme/Allergies: Does not bruise/bleed easily.  Psychiatric/Behavioral: Positive for depression. The patient is nervous/anxious and has insomnia.      MEDICAL HISTORY:  Past Medical History:  Diagnosis Date  . [place] cancer ([place]   . [facility] 09/2018   Right breast  . Family history of breast cancer   . Hypertension   . Personal history of radiation therapy 2020   Rt breast    SURGICAL HISTORY: Past Surgical History:  Procedure Laterality Date  . BREAST BIOPSY Right 09/12/2018   [facility]/"venus clip"-INVASIVE MAMMARYLobular CARCINOMA, WITH LOBULAR FEATURES  . BREAST LUMPECTOMY Right 10/04/2018  . BREAST LUMPECTOMY WITH NEEDLE LOCALIZATION AND AXILLARY SENTINEL LYMPH NODE BX Right 10/04/2018   Procedure: BREAST LUMPECTOMY WITH NEEDLE LOCALIZATION AND AXILLARY SENTINEL LYMPH NODE BX;  Surgeon: CHerbert Pun MD;  Location: [facility] ORS;  Service: General;  Laterality: Right;  . TONSILLECTOMY      SOCIAL HISTORY: Social History   Socioeconomic History  . Marital status: Married    Spouse name: Not on file  . Number of children: Not on file  . Years of education: Not on file  . Highest education level: Not on file  Occupational History  . Not on file  Tobacco Use  . Smoking status: Current Every Day Smoker  . Smokeless tobacco: Never Used  Vaping Use  . Vaping Use: Former  Substance and Sexual Activity  . Alcohol use: Yes    Comment: OCCAS  . Drug use: No  . Sexual  activity: Not on file  Other Topics Concern  . Not on file  Social History Narrative   Works at [employer];  Smoke 15 cig/day; No alcohol abuse; in [employer]; with husband.    Social Determinants of Health   Financial Resource Strain: Not on file  Food Insecurity: Not on file  Transportation Needs: Not on file  Physical  Activity: Not on file  Stress: Not on file  Social Connections: Not on file  Intimate Partner Violence: Not on file    FAMILY HISTORY: Family History  Problem Relation Age of Onset  . Breast cancer Paternal Aunt     ALLERGIES:  has No Known Allergies.  MEDICATIONS:  Current Outpatient Medications  Medication Sig Dispense Refill  . acetaminophen (TYLENOL) 500 MG tablet Take 500 mg by mouth every 6 (six) hours as needed for moderate pain.    Marland Kitchen amLODipine (NORVASC) 5 MG tablet Take 5 mg by mouth daily.    Marland Kitchen aspirin-sod bicarb-citric acid (ALKA-SELTZER) 325 MG TBEF tablet Take 325 mg by mouth every 6 (six) hours as needed (indigestion).    . celecoxib (CELEBREX) 100 MG capsule Take by mouth.    . citalopram (CELEXA) 20 MG tablet TAKE 1 TABLET BY MOUTH EVERY DAY 30 tablet 3  . pantoprazole (PROTONIX) 40 MG tablet Take 40 mg by mouth daily as needed (heartburn).     . tamoxifen (NOLVADEX) 20 MG tablet TAKE 1 TABLET BY MOUTH EVERY DAY 90 tablet 3   No current facility-administered medications for this visit.      Marland Kitchen  PHYSICAL EXAMINATION: ECOG PERFORMANCE STATUS: 0 - Asymptomatic  Vitals:   11/04/20 1341  BP: 125/88  Pulse: 81  Resp: 16  Temp: 99.2 F (37.3 C)  SpO2: 98%   Filed Weights   11/04/20 1341  Weight: 167 lb (75.8 kg)    Physical Exam Constitutional:      Comments: Alone.  Ambulating independently.  HENT:     Head: Normocephalic and atraumatic.     Mouth/Throat:     Pharynx: No oropharyngeal exudate.  Eyes:     Pupils: Pupils are equal, round, and reactive to light.  Cardiovascular:     Rate and Rhythm: Normal rate and regular rhythm.  Pulmonary:     Effort: Pulmonary effort is normal. No respiratory distress.     Breath sounds: Normal breath sounds. No wheezing.  Abdominal:     General: Bowel sounds are normal. There is no distension.     Palpations: Abdomen is soft. There is no mass.     Tenderness: There is no abdominal tenderness. There is no  guarding or rebound.  Musculoskeletal:        General: No tenderness. Normal range of motion.     Cervical back: Normal range of motion and neck supple.  Skin:    General: Skin is warm.  Neurological:     Mental Status: She is alert and oriented to person, place, and time.  Psychiatric:        Mood and Affect: Affect normal.     Comments: Anxious/tearful      LABORATORY DATA:  I have reviewed the data as listed Lab Results  Component Value Date   WBC 11.1 (H) 10/31/2020   HGB 14.6 10/31/2020   HCT 42.9 10/31/2020   MCV 95.5 10/31/2020   PLT 270 10/31/2020   Recent Labs    10/31/20 1519  NA 139  K 3.9  CL 106  CO2 24  GLUCOSE  97  BUN 17  CREATININE 0.80  CALCIUM 8.9  GFRNONAA >60  PROT 7.7  ALBUMIN 4.4  AST 17  ALT 12  ALKPHOS 73  BILITOT 0.5    RADIOGRAPHIC STUDIES: I have personally reviewed the radiological images as listed and agreed with the findings in the report. No results found.  ASSESSMENT & PLAN:   Carcinoma of upper-outer quadrant of right breast in female, estrogen receptor positive ([place]) # Stage IA ER PR positive HER-2/neu negative.;  Oncotype low risk - on Tamoxifen.  Stable mammogram Feb 2022- WNL.   # HOLD  Tamoxifen- ? Psychosis/mood swings [see below]  # Psychosis/mood swings- ? Tamoxifen; HOLD tamoxifen. Refer to psychiatry.  No concerns for any self-harm/or homicidal ideas.  # Myalgias/ fatigue-G-2; question tamoxifen versus low vitamin D-recommend vitamin D over-the-counter.   # DISPOSITION: # refer to psychiatry/Dr.Kapur ASAP re: mood swings.  # Follow up 1 month- MD; NO labs- Dr.B  All questions were answered. The patient knows to call the clinic with any problems, questions or concerns.    Cammie Sickle, MD 11/05/2020 1:22 PM

## 2020-11-04 NOTE — Assessment & Plan Note (Addendum)
#  Stage IA ER PR positive HER-2/neu negative.;  Oncotype low risk - on Tamoxifen.  Stable mammogram Feb 2022- WNL.   # HOLD  Tamoxifen- ? Psychosis/mood swings [see below]  # Psychosis/mood swings- ? Tamoxifen; HOLD tamoxifen. Refer to psychiatry.  No concerns for any self-harm/or homicidal ideas.  # Myalgias/ fatigue-G-2; question tamoxifen versus low vitamin D-recommend vitamin D over-the-counter.   # DISPOSITION: # refer to psychiatry/Dr.Kapur ASAP re: mood swings.  # Follow up 1 month- MD; NO labs- Dr.B

## 2020-11-04 NOTE — Patient Instructions (Signed)
#   HOLD tamoxifen Until further direction.   # Take Vit 1000 unit once a day.

## 2020-12-01 ENCOUNTER — Ambulatory Visit
Admission: RE | Admit: 2020-12-01 | Discharge: 2020-12-01 | Disposition: A | Discharge status: Home / Self Care | Payer: Self-pay | Attending: Family Medicine | Admitting: Family Medicine

## 2020-12-01 ENCOUNTER — Ambulatory Visit
Admission: RE | Admit: 2020-12-01 | Discharge: 2020-12-01 | Disposition: A | Discharge status: Home / Self Care | Payer: Self-pay | Source: Ambulatory Visit | Attending: Family Medicine | Admitting: Family Medicine

## 2020-12-01 ENCOUNTER — Other Ambulatory Visit: Payer: Self-pay | Admitting: Family Medicine

## 2020-12-01 DIAGNOSIS — M79672 Pain in left foot: Secondary | ICD-10-CM

## 2020-12-02 ENCOUNTER — Telehealth: Payer: Self-pay | Admitting: *Deleted

## 2020-12-02 ENCOUNTER — Inpatient Hospital Stay: Payer: Self-pay | Attending: Internal Medicine | Admitting: Internal Medicine

## 2020-12-02 DIAGNOSIS — Z170 Estrogen receptor positive status [ER+]: Secondary | ICD-10-CM

## 2020-12-02 DIAGNOSIS — C50411 Malignant neoplasm of upper-outer quadrant of right female breast: Secondary | ICD-10-CM

## 2020-12-02 NOTE — Progress Notes (Signed)
Patient declined meeting with the physician.  Appointment canceled; new appointments offered.  GB

## 2020-12-02 NOTE — Progress Notes (Signed)
See phone note-. Patient refused to be evaluated today by provider.

## 2020-12-02 NOTE — Telephone Encounter (Signed)
Patient came to her apt today. Refused to see Dr. Rogue Bussing. Requested that her bp and HR be checked. Reading were: (154/94); HR- 68  Patient stated that she "only wanted to talk to the nurse and not the doctor." I asked her why she didn't call us prior to the visit if she just wanted to speak to the nurse. She stated that she was on her way to the "[place] and had to come this direction anyways."    She wanted me to pass along the following concerns to Dr. B  Patient c/o "easy brusing." She wanted to know if she could "donate her kidney."  She is following up [facility] for [facility] check up and recently had a complete physical.  She has been revaluated by psychiatry for her anger management issues. She is off the tamoxifen as directed. She has been dx with osteoarthritis & bursitis of her hips  I told her I would give Dr. B all the msgs.  She asked me to report her symptoms to Dr. Rogue Bussing and to send her a mychart message with her next apt with Dr. Rogue Bussing. Msg given to Dr. Rogue Bussing. Md requested pt to come in 2 months for check.

## 2021-01-01 ENCOUNTER — Other Ambulatory Visit: Payer: Self-pay

## 2021-01-01 ENCOUNTER — Emergency Department: Payer: Self-pay

## 2021-01-01 ENCOUNTER — Emergency Department
Admission: EM | Admit: 2021-01-01 | Discharge: 2021-01-06 | Disposition: A | Discharge status: Home / Self Care | Payer: 59 | Attending: Emergency Medicine | Admitting: Emergency Medicine

## 2021-01-01 DIAGNOSIS — F29 Unspecified psychosis not due to a substance or known physiological condition: Secondary | ICD-10-CM | POA: Diagnosis present

## 2021-01-01 DIAGNOSIS — Z79899 Other long term (current) drug therapy: Secondary | ICD-10-CM | POA: Insufficient documentation

## 2021-01-01 DIAGNOSIS — F23 Brief psychotic disorder: Secondary | ICD-10-CM | POA: Diagnosis present

## 2021-01-01 DIAGNOSIS — Z046 Encounter for general psychiatric examination, requested by authority: Secondary | ICD-10-CM

## 2021-01-01 DIAGNOSIS — R4689 Other symptoms and signs involving appearance and behavior: Secondary | ICD-10-CM

## 2021-01-01 DIAGNOSIS — Y900 Blood alcohol level of less than 20 mg/100 ml: Secondary | ICD-10-CM | POA: Insufficient documentation

## 2021-01-01 DIAGNOSIS — Z923 Personal history of irradiation: Secondary | ICD-10-CM | POA: Insufficient documentation

## 2021-01-01 DIAGNOSIS — F17200 Nicotine dependence, unspecified, uncomplicated: Secondary | ICD-10-CM | POA: Insufficient documentation

## 2021-01-01 DIAGNOSIS — I10 Essential (primary) hypertension: Secondary | ICD-10-CM | POA: Insufficient documentation

## 2021-01-01 DIAGNOSIS — Z853 Personal history of malignant neoplasm of breast: Secondary | ICD-10-CM | POA: Insufficient documentation

## 2021-01-01 DIAGNOSIS — Z20822 Contact with and (suspected) exposure to covid-19: Secondary | ICD-10-CM | POA: Insufficient documentation

## 2021-01-01 LAB — COMPREHENSIVE METABOLIC PANEL
ALT: 17 U/L (ref 0–44)
AST: 17 U/L (ref 15–41)
Albumin: 4.2 g/dL (ref 3.5–5.0)
Alkaline Phosphatase: 70 U/L (ref 38–126)
Anion gap: 11 (ref 5–15)
BUN: 11 mg/dL (ref 6–20)
CO2: 25 mmol/L (ref 22–32)
Calcium: 9 mg/dL (ref 8.9–10.3)
Chloride: 100 mmol/L (ref 98–111)
Creatinine, Ser: 0.65 mg/dL (ref 0.44–1.00)
GFR, Estimated: >60 mL/min (ref >60)
Glucose, Bld: 101 mg/dL — ABNORMAL HIGH (ref 70–99)
Potassium: 2.9 mmol/L — ABNORMAL LOW (ref 3.5–5.1)
Sodium: 136 mmol/L (ref 135–145)
Total Bilirubin: 0.6 mg/dL (ref 0.3–1.2)
Total Protein: 7.5 g/dL (ref 6.5–8.1)

## 2021-01-01 LAB — URINE DRUG SCREEN, QUALITATIVE (ARMC ONLY)
Amphetamines, Ur Screen: NOT DETECTED
Barbiturates, Ur Screen: NOT DETECTED
Benzodiazepine, Ur Scrn: NOT DETECTED
Cannabinoid 50 Ng, Ur ~~LOC~~: POSITIVE — AB
Cocaine Metabolite,Ur ~~LOC~~: NOT DETECTED
MDMA (Ecstasy)Ur Screen: NOT DETECTED
Methadone Scn, Ur: NOT DETECTED
Opiate, Ur Screen: NOT DETECTED
Phencyclidine (PCP) Ur S: NOT DETECTED
Tricyclic, Ur Screen: NOT DETECTED

## 2021-01-01 LAB — RESP PANEL BY RT-PCR (FLU A&B, COVID) ARPGX2
Influenza A by PCR: NEGATIVE
Influenza B by PCR: NEGATIVE
SARS Coronavirus 2 by RT PCR: NEGATIVE

## 2021-01-01 LAB — CBC
HCT: 42.9 % (ref 36.0–46.0)
Hemoglobin: 14.8 g/dL (ref 12.0–15.0)
MCH: 32.4 pg (ref 26.0–34.0)
MCHC: 34.5 g/dL (ref 30.0–36.0)
MCV: 93.9 fL (ref 80.0–100.0)
Platelets: 292 10*3/uL (ref 150–400)
RBC: 4.57 MIL/uL (ref 3.87–5.11)
RDW: 13.4 % (ref 11.5–15.5)
WBC: 14.3 10*3/uL — ABNORMAL HIGH (ref 4.0–10.5)
nRBC: 0 % (ref 0.0–0.2)

## 2021-01-01 LAB — SALICYLATE LEVEL: Salicylate Lvl: <7 mg/dL — ABNORMAL LOW (ref 7.0–30.0)

## 2021-01-01 LAB — ETHANOL: Alcohol, Ethyl (B): <10 mg/dL (ref <10)

## 2021-01-01 LAB — ACETAMINOPHEN LEVEL: Acetaminophen (Tylenol), Serum: <10 ug/mL — ABNORMAL LOW (ref 10–30)

## 2021-01-01 LAB — PREGNANCY, URINE: Preg Test, Ur: NEGATIVE

## 2021-01-01 MED ORDER — LORAZEPAM 2 MG PO TABS
2.0000 mg | ORAL_TABLET | Freq: Four times a day (QID) | ORAL | Status: DC | PRN
  Administered 2021-01-01 – 2021-01-02 (×2): 2 mg via ORAL
  Filled 2021-01-01 (×3): qty 1

## 2021-01-01 MED ORDER — QUETIAPINE FUMARATE 25 MG PO TABS
50.0000 mg | ORAL_TABLET | Freq: Every day | ORAL | Status: DC
  Administered 2021-01-01 – 2021-01-02 (×2): 50 mg via ORAL
  Filled 2021-01-01 (×2): qty 2

## 2021-01-01 MED ORDER — LORAZEPAM 2 MG/ML IJ SOLN
2.0000 mg | Freq: Four times a day (QID) | INTRAMUSCULAR | Status: DC | PRN
  Filled 2021-01-01: qty 1

## 2021-01-01 MED ORDER — POTASSIUM CHLORIDE CRYS ER 20 MEQ PO TBCR
40.0000 meq | EXTENDED_RELEASE_TABLET | Freq: Once | ORAL | Status: AC
  Administered 2021-01-01: 40 meq via ORAL
  Filled 2021-01-01: qty 2

## 2021-01-01 NOTE — ED Notes (Signed)
Hourly rounding performed, patient currently asleep in room. Patient has no complaints at this time. Q15 minute rounds and monitoring via [employer] to continue.
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
## 2021-01-01 NOTE — ED Provider Notes (Signed)
[facility] Emergency Department Provider Note   ____________________________________________   I have reviewed the triage vital signs and the nursing notes.   HISTORY  Chief Complaint My husband  History limited by: Altered mental status   HPI Penny Chase is a 50 y.o. female who presents to the emergency department today because of concerns for altered mental status and possible psychosis.  Patient did come in under IVC. They were patient has been exhibiting altered behavior and has been threatening.  Patient herself states that it was her husband that should be here.  She states she is trying to get a divorce.  She states that he preemptively does things that he is the crazy.  Patient denies any medical complaints.   Records reviewed. Per medical record review patient has a history of HTN.   Past Medical History:  Diagnosis Date  . Breast cancer ([place])   . Cancer [facility]) 09/2018   Right breast  . Family history of breast cancer   . Hypertension   . Personal history of radiation therapy 2020   Rt breast    Patient Active Problem List   Diagnosis Date Noted  . Family history of breast cancer   . Carcinoma of upper-outer quadrant of right breast in female, estrogen receptor positive ([place]) 09/20/2018    Past Surgical History:  Procedure Laterality Date  . BREAST BIOPSY Right 09/12/2018   [place] core/"venus clip"-INVASIVE MAMMARYLobular CARCINOMA, WITH LOBULAR FEATURES  . BREAST LUMPECTOMY Right 10/04/2018  . BREAST LUMPECTOMY WITH NEEDLE LOCALIZATION AND AXILLARY SENTINEL LYMPH NODE BX Right 10/04/2018   Procedure: BREAST LUMPECTOMY WITH NEEDLE LOCALIZATION AND AXILLARY SENTINEL LYMPH NODE BX;  Surgeon: Herbert Pun, MD;  Location: [facility] ORS;  Service: General;  Laterality: Right;  . TONSILLECTOMY      Prior to Admission medications   Medication Sig Start Date End Date Taking? Authorizing Provider  acetaminophen (TYLENOL) 500 MG tablet Take  500 mg by mouth every 6 (six) hours as needed for moderate pain.    [provider]  amLODipine (NORVASC) 5 MG tablet Take 5 mg by mouth daily. 09/12/18   [provider]  aspirin-sod bicarb-citric acid (ALKA-SELTZER) 325 MG TBEF tablet Take 325 mg by mouth every 6 (six) hours as needed (indigestion).    [provider]  citalopram (CELEXA) 20 MG tablet TAKE 1 TABLET BY MOUTH EVERY DAY 11/04/20   Cammie Sickle, MD  pantoprazole (PROTONIX) 40 MG tablet Take 40 mg by mouth daily as needed (heartburn).  07/18/18   [provider]  tamoxifen (NOLVADEX) 20 MG tablet TAKE 1 TABLET BY MOUTH EVERY DAY 08/28/20   Cammie Sickle, MD    Allergies Patient has no known allergies.  Family History  Problem Relation Age of Onset  . Breast cancer Paternal Aunt     Social History Social History   Tobacco Use  . Smoking status: Current Every Day Smoker  . Smokeless tobacco: Never Used  Vaping Use  . Vaping Use: Former  Substance Use Topics  . Alcohol use: Yes    Comment: OCCAS  . Drug use: No    Review of Systems Constitutional: No fever/chills Eyes: No visual changes. ENT: No sore throat. Cardiovascular: Denies chest pain. Respiratory: Denies shortness of breath. Gastrointestinal: No abdominal pain.  No nausea, no vomiting.  No diarrhea.   Genitourinary: Negative for dysuria. Musculoskeletal: Negative for back pain. Skin: Negative for rash. Neurological: Negative for headaches, focal weakness or numbness.  ____________________________________________  PHYSICAL EXAM:  VITAL SIGNS: ED Triage Vitals  Enc Vitals Group     BP 01/01/21 1539 (!) 145/90     Pulse Rate 01/01/21 1539 67     Resp 01/01/21 1539 18     Temp --      Temp src --      SpO2 01/01/21 1539 99 %     Weight 01/01/21 1502 150 lb (68 kg)     Height 01/01/21 1502 5\' 4"  (1.626 m)     Head Circumference --      Peak Flow --    Constitutional: Alert and oriented.   Eyes: Conjunctivae are normal.  ENT      Head: Normocephalic and atraumatic.      Nose: No congestion/rhinnorhea.      Mouth/Throat: Mucous membranes are moist.      Neck: No stridor. Hematological/Lymphatic/Immunilogical: No cervical lymphadenopathy. Cardiovascular: Normal rate, regular rhythm.  No murmurs, rubs, or gallops.  Respiratory: Normal respiratory effort without tachypnea nor retractions. Breath sounds are clear and equal bilaterally. No wheezes/rales/rhonchi. Gastrointestinal: Soft and non tender. No rebound. No guarding.  Genitourinary: Deferred Musculoskeletal: Normal range of motion in all extremities. No lower extremity edema. Neurologic:  Normal speech and language. No gross focal neurologic deficits are appreciated.  Skin:  Skin is warm, dry and intact. No rash noted. Psychiatric: Agitated. Somewhat tangential thought. Denies SI/HI  ____________________________________________    LABS (pertinent positives/negatives)  Salicylate, ethanol, acetaminophen below threshold CBC wbc 14.3, hgb 14.8, plt 292 CMP na 136, k 2.9, glu 101, cr 0.65 UDS cannabinoid positive  ____________________________________________   EKG  None  ____________________________________________    RADIOLOGY  None  ____________________________________________   PROCEDURES  Procedures  ____________________________________________   INITIAL IMPRESSION / ASSESSMENT AND PLAN / ED COURSE  Pertinent labs & imaging results that were available during my care of the patient were reviewed by me and considered in my medical decision making (see chart for details).   Patient presented to the emergency department today under IVC.  On exam patient is exhibiting some abnormal behavior.  Patient was seen by psychiatry who will plan on admission.  The patient has been placed in psychiatric observation due to the need to provide a safe environment for the patient while obtaining psychiatric  consultation and evaluation, as well as ongoing medical and medication management to treat the patient's condition.  The patient has been placed under full IVC at this time.   ___________________________________________   FINAL CLINICAL IMPRESSION(S) / ED DIAGNOSES  Final diagnoses:  Abnormal behavior  Involuntary commitment     Note: This dictation was prepared with Dragon dictation. Any transcriptional errors that result from this process are unintentional     Nance Pear, MD 01/01/21 1758
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
## 2021-01-01 NOTE — ED Notes (Signed)
Taken to and from CT by wheelchair with this RN and Security.

## 2021-01-01 NOTE — ED Notes (Signed)
Pt given dinner tray.

## 2021-01-01 NOTE — ED Notes (Signed)
Patient transferred from ED to [facility] room 2 after screening for contraband. Report received from [place], RN including Situation, Background, Assessment and Recommendations. Pt oriented to unit including Q15 minute rounds as well as the security cameras for their protection. Patient is alert and oriented, warm and dry in no acute distress. Pt is very tearful and crying on arrival, states that she does not want to be here over night. States that the person who needs to be here isn't here and is going to shoot themselves. Pt does not elaborate at this time and storms to room, falls onto bed and lays down.
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
## 2021-01-01 NOTE — ED Notes (Signed)
IVC/pending placement. Patient moved to [facility] 2

## 2021-01-01 NOTE — ED Notes (Signed)
Offered ativan, declined.

## 2021-01-01 NOTE — ED Notes (Signed)
Hourly rounding performed, patient currently awake in room. Patient has no complaints at this time. Q15 minute rounds and monitoring via Security Cameras to continue. 

## 2021-01-01 NOTE — ED Notes (Signed)
Pt ambulatory to the bathroom at this time.

## 2021-01-02 MED ORDER — ACETAMINOPHEN 325 MG PO TABS
650.0000 mg | ORAL_TABLET | Freq: Once | ORAL | Status: AC
  Administered 2021-01-02: 650 mg via ORAL

## 2021-01-02 MED ORDER — ACETAMINOPHEN 325 MG PO TABS
ORAL_TABLET | ORAL | Status: AC
  Filled 2021-01-02: qty 2

## 2021-01-02 NOTE — ED Notes (Signed)
Patient has come knocking "AT [place] 20-30 MINUTES REQUESTING TO SPEAK TO DOCTOR, ALSO REQUESTING TO USE PHONE SO SHE CAN CALL 911 AND HAVE THEM COME GET HER SINCE THEY BROUGHT HER IN THEY CAN TAKE HER BACK."

## 2021-01-02 NOTE — ED Notes (Signed)
Pt yelling at staff, "don't you read my chart, I asked for these meds three hours ago?" pt yelling at this rn calling rn "fat bitch", "lazy fat bitch". Pt informed that this rn was not here three hours ago and sorry for delay in medication. Pt continues to cuss at staff and call this RN insulting names.

## 2021-01-02 NOTE — ED Notes (Signed)
Pt asleep at this time, unable to collect vitals. Will collect pt vitals once awake. 

## 2021-01-02 NOTE — ED Notes (Signed)
Hourly rounding performed, patient currently asleep in room. Patient has no complaints at this time. Q15 minute rounds and monitoring via [employer] to continue.

## 2021-01-02 NOTE — ED Notes (Signed)
Pt knocking on rn station door, this rn at [facility] pulling medications for another pt, security opened door, pt immediately began talking loudly at [employer]. RN informed pt that this RN is pulling medication for another pt and to please "give me just a minute".

## 2021-01-02 NOTE — ED Notes (Signed)
Pt provided with a dose of hand sanitizer into hands per request for hygeine. Message sent to dr. Jari Pigg to please review and order home meds. Pt agitated, up to RN station frequently states "I asked you this morning for germ-x and now my temperature is 99.9." pt informed that hand sanitizer is not allowed on unit and any time she needs some to please come to RN station and this RN will dispense in her hands. Pt does not verbalize understanding.

## 2021-01-02 NOTE — ED Notes (Addendum)
Pt yelling in room "if that big fat bitch comes in here tonight, ugh," and states "bitch, bitch, bitch" regarding nursing staff. Pt informed multiple times to refrain from cussing. Patient in room 4 is yelling at pt to stop "with that disrespect". Pt yelling in room "don't interrupt her, she has medications, stupid fat bitch".

## 2021-01-02 NOTE — ED Notes (Signed)
PT given sprite  
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
## 2021-01-02 NOTE — BH Assessment (Signed)
Referral information for Psychiatric Hospitalization faxed to;   [facility] [phone]),   . [facility] ([phone]---[phone]---[phone]),  . [facility] [phone], [phone], [phone] or [phone]),   . [place] [phone] or [phone])  . [facility] [phone]),   . [facility] [phone] -or- [phone]),   . [facility] [phone]),  . [facility] [phone]).

## 2021-01-02 NOTE — ED Notes (Signed)
Spoke with susan in pharmacy regarding need for med rec to order home medications, secure chat sent to dr. Jari Pigg to review home meds and order if desired.

## 2021-01-02 NOTE — ED Notes (Signed)
Pt provided with milk and graham crackers per request.

## 2021-01-02 NOTE — ED Notes (Signed)
IVC/Pending Placement 

## 2021-01-02 NOTE — ED Notes (Signed)
Pt provided with additional po fluidsper request. Pt remains nearly constantly coming to door and requesting various items. Pt now requesting a foot massage and jacuzzi.

## 2021-01-02 NOTE — ED Notes (Signed)
Pt agitated, yelling in room. Pt cussing and verbally abusing staff. Pt informed to reduce hostile language.

## 2021-01-03 DIAGNOSIS — F23 Brief psychotic disorder: Secondary | ICD-10-CM | POA: Diagnosis not present

## 2021-01-03 MED ORDER — LORAZEPAM 2 MG/ML IJ SOLN
2.0000 mg | Freq: Once | INTRAMUSCULAR | Status: AC
  Administered 2021-01-03: 2 mg via INTRAMUSCULAR

## 2021-01-03 MED ORDER — DROPERIDOL 2.5 MG/ML IJ SOLN
5.0000 mg | Freq: Once | INTRAMUSCULAR | Status: AC
  Administered 2021-01-03: 5 mg via INTRAMUSCULAR
  Filled 2021-01-03: qty 2

## 2021-01-03 MED ORDER — LORAZEPAM 2 MG/ML IJ SOLN
INTRAMUSCULAR | Status: AC
  Administered 2021-01-03: 2 mg via INTRAMUSCULAR
  Filled 2021-01-03: qty 1

## 2021-01-03 MED ORDER — ASENAPINE MALEATE 5 MG SL SUBL
5.0000 mg | SUBLINGUAL_TABLET | Freq: Once | SUBLINGUAL | Status: AC
  Administered 2021-01-03: 5 mg via SUBLINGUAL
  Filled 2021-01-03 (×2): qty 1

## 2021-01-03 MED ORDER — DIPHENHYDRAMINE HCL 50 MG/ML IJ SOLN
INTRAMUSCULAR | Status: AC
  Administered 2021-01-03: 50 mg
  Filled 2021-01-03: qty 1

## 2021-01-03 NOTE — Consult Note (Signed)
[facility] Consult   Reason for Consult: Consult for 50 year old woman brought in under IVC filed by her husband reporting psychotic symptoms Referring Physician: Archie Balboa Patient Identification: Penny Chase MRN:  [id number] Principal Diagnosis: Brief psychotic disorder [facility]) Diagnosis:  Principal Problem:   Brief psychotic disorder ([place])   Total Time spent with patient: 30 minutes  Subjective:   Penny Chase is a 50 y.o. female patient admitted with "I've been sleeping".  50 yo female with mania symptoms and given PRN agitation prior to the assessment.  She was irritable and demanding to leave, not sleeping despite medications.   After the am medications, she slept one hour.  Seroquel changed to Saphris once to encourage sleep tonight in the hopes she will be more stable after sleeping.  Tangential, no insight, agitated, tangential, etc.  Continues to meet inpatient psychiatric criteria.  Dr Weber Cooks on admission 5/20: HPI: Patient seen chart reviewed.  50 year old woman without very much of a past psychiatric history.  Commitment papers filed by her husband report that she is having mood swings that are causing her to become combative and physically aggressive towards her husband.  Also claims that she has abused alcohol and pain medicine.  On interview the patient was agitated hyperverbal and largely uncooperative.  She displays pressured speech and is hyper religious combative and flirtatious.  She expresses hostility towards her husband.  She admits to having been aggressive towards him but claims he started all of it.  She denies alcohol or drug use.  Patient says she has been up since 3:00 in the morning but claims that she goes to bed around 9 so she is sleeping 6 hours or more a night.  Husband's petition says that she is only sleeping about 2 hours a night.  Review of medical records indicates that her cancer doctor did recognize that she has had some changes in her  mental status and discontinued her tamoxifen as a result.  Past Psychiatric History: Patient has a past history of being treated with citalopram after her breast cancer.  No previous diagnosis of bipolar disorder or major mood disorder.  No history of psychiatric hospitalization and no history of suicide attempts no previous history of psychosis or substance abuse  Risk to Self:   Risk to Others:   Prior Inpatient Therapy:   Prior Outpatient Therapy:    Past Medical History:  Past Medical History:  Diagnosis Date  . Breast cancer ([place])   . Cancer ([place]) 09/2018   Right breast  . COVID-19 11/2018  . Family history of breast cancer   . Hypertension   . Personal history of radiation therapy 2020   Rt breast    Past Surgical History:  Procedure Laterality Date  . BREAST BIOPSY Right 09/12/2018   [place] core/"venus clip"-INVASIVE MAMMARYLobular CARCINOMA, WITH LOBULAR FEATURES  . BREAST LUMPECTOMY Right 10/04/2018  . BREAST LUMPECTOMY WITH NEEDLE LOCALIZATION AND AXILLARY SENTINEL LYMPH NODE BX Right 10/04/2018   Procedure: BREAST LUMPECTOMY WITH NEEDLE LOCALIZATION AND AXILLARY SENTINEL LYMPH NODE BX;  Surgeon: Herbert Pun, MD;  Location: [facility] ORS;  Service: General;  Laterality: Right;  . TONSILLECTOMY     Family History:  Family History  Problem Relation Age of Onset  . Breast cancer Paternal Aunt    Family Psychiatric  History: Denies any Social History:  Social History   Substance and Sexual Activity  Alcohol Use Yes   Comment: OCCAS     Social History   Substance and Sexual  Activity  Drug Use No    Social History   Socioeconomic History  . Marital status: Married    Spouse name: Not on file  . Number of children: Not on file  . Years of education: Not on file  . Highest education level: Not on file  Occupational History  . Not on file  Tobacco Use  . Smoking status: Current Every Day Smoker  . Smokeless tobacco: Never Used  Vaping Use  . Vaping Use:  Former  Substance and Sexual Activity  . Alcohol use: Yes    Comment: OCCAS  . Drug use: No  . Sexual activity: Not on file  Other Topics Concern  . Not on file  Social History Narrative   Works at [employer];  Smoke 15 cig/day; No alcohol abuse; in [employer]; with husband.    Social Determinants of Health   Financial Resource Strain: Not on file  Food Insecurity: Not on file  Transportation Needs: Not on file  Physical Activity: Not on file  Stress: Not on file  Social Connections: Not on file   Additional Social History:    Allergies:  No Known Allergies  Labs:  Results for orders placed or performed during the hospital encounter of 01/01/21 (from the past 48 hour(s))  Resp Panel by RT-PCR (Flu A&B, Covid) Nasopharyngeal Swab     Status: None   Collection Time: 01/01/21  5:54 PM   Specimen: Nasopharyngeal Swab; Nasopharyngeal(NP) swabs in vial transport medium  Result Value Ref Range   SARS Coronavirus 2 by RT PCR NEGATIVE NEGATIVE    Comment: (NOTE) SARS-CoV-2 target nucleic acids are NOT DETECTED.  The SARS-CoV-2 RNA is generally detectable in upper respiratory specimens during the acute phase of infection. The lowest concentration of SARS-CoV-2 viral copies this assay can detect is 138 copies/mL. A negative result does not preclude SARS-Cov-2 infection and should not be used as the sole basis for treatment or other patient management decisions. A negative result may occur with  improper specimen collection/handling, submission of specimen other than nasopharyngeal swab, presence of viral mutation(s) within the areas targeted by this assay, and inadequate number of viral copies(<138 copies/mL). A negative result must be combined with clinical observations, patient history, and epidemiological information. The expected result is Negative.  Fact Sheet for Patients:  EntrepreneurPulse.com.au  Fact Sheet for Healthcare Providers:   IncredibleEmployment.be  This test is no t yet approved or cleared by the [place] FDA and  has been authorized for detection and/or diagnosis of SARS-CoV-2 by FDA under an Emergency Use Authorization (EUA). This EUA will remain  in effect (meaning this test can be used) for the duration of the COVID-19 declaration under Section 564(b)(1) of the Act, 21 U.S.C.section 360bbb-3(b)(1), unless the authorization is terminated  or revoked sooner.       Influenza A by PCR NEGATIVE NEGATIVE   Influenza B by PCR NEGATIVE NEGATIVE    Comment: (NOTE) The Xpert Xpress SARS-CoV-2/FLU/RSV plus assay is intended as an aid in the diagnosis of influenza from Nasopharyngeal swab specimens and should not be used as a sole basis for treatment. Nasal washings and aspirates are unacceptable for Xpert Xpress SARS-CoV-2/FLU/RSV testing.  Fact Sheet for Patients: EntrepreneurPulse.com.au  Fact Sheet for Healthcare Providers: IncredibleEmployment.be  This test is not yet approved or cleared by the [place] FDA and has been authorized for detection and/or diagnosis of SARS-CoV-2 by FDA under an Emergency Use Authorization (EUA). This EUA will remain in effect (meaning this  test can be used) for the duration of the COVID-19 declaration under Section 564(b)(1) of the Act, 21 U.S.C. section 360bbb-3(b)(1), unless the authorization is terminated or revoked.  Performed at [facility], [address], [place], [place] [address]     Current Facility-Administered Medications  Medication Dose Route Frequency Provider Last Rate Last Admin  . asenapine (SAPHRIS) sublingual tablet 5 mg  5 mg Sublingual Once Patrecia Pour, NP      . LORazepam (ATIVAN) tablet 2 mg  2 mg Oral Q6H PRN Clapacs, [name], MD   2 mg at 01/02/21 2054   Or  . LORazepam (ATIVAN) injection 2 mg  2 mg Intramuscular Q6H PRN Clapacs, [name], MD       Current  Outpatient Medications  Medication Sig Dispense Refill  . acetaminophen (TYLENOL) 500 MG tablet Take 500 mg by mouth every 6 (six) hours as needed for moderate pain.    Marland Kitchen amLODipine (NORVASC) 5 MG tablet Take 5 mg by mouth daily.    Marland Kitchen aspirin-sod bicarb-citric acid (ALKA-SELTZER) 325 MG TBEF tablet Take 325 mg by mouth every 6 (six) hours as needed (indigestion).    . citalopram (CELEXA) 20 MG tablet TAKE 1 TABLET BY MOUTH EVERY DAY 30 tablet 3  . pantoprazole (PROTONIX) 40 MG tablet Take 40 mg by mouth daily as needed (heartburn).     . tamoxifen (NOLVADEX) 20 MG tablet TAKE 1 TABLET BY MOUTH EVERY DAY 90 tablet 3    Musculoskeletal: Strength & Muscle Tone: within normal limits Gait & Station: normal Patient leans: N/A            Psychiatric Specialty Exam:  Presentation  General Appearance: No data recorded Eye Contact:No data recorded Speech:No data recorded Speech Volume:No data recorded Handedness:No data recorded  Mood and Affect  Mood:No data recorded Affect:No data recorded  Thought Process  Thought Processes:No data recorded Descriptions of Associations:No data recorded Orientation:No data recorded Thought Content:No data recorded History of Schizophrenia/Schizoaffective disorder:No  Duration of Psychotic Symptoms:No data recorded Hallucinations:No data recorded Ideas of Reference:No data recorded Suicidal Thoughts:No data recorded Homicidal Thoughts:No data recorded  Sensorium  Memory:No data recorded Judgment:No data recorded Insight:No data recorded  Executive Functions  Concentration:No data recorded Attention Span:No data recorded Recall:No data recorded Fund of Knowledge:No data recorded Language:No data recorded  Psychomotor Activity  Psychomotor Activity:No data recorded  Assets  Assets:No data recorded  Sleep  Sleep:No data recorded  Physical Exam: Physical Exam Vitals and nursing note reviewed.  Constitutional:       Appearance: Normal appearance.  HENT:     Head: Normocephalic and atraumatic.     Mouth/Throat:     Pharynx: Oropharynx is clear.  Eyes:     Pupils: Pupils are equal, round, and reactive to light.  Cardiovascular:     Rate and Rhythm: Normal rate and regular rhythm.  Pulmonary:     Effort: Pulmonary effort is normal.     Breath sounds: Normal breath sounds.  Abdominal:     General: Abdomen is flat.     Palpations: Abdomen is soft.  Musculoskeletal:        General: Normal range of motion.  Skin:    General: Skin is warm and dry.  Neurological:     General: No focal deficit present.     Mental Status: She is alert. Mental status is at baseline.  Psychiatric:        Attention and Perception: She is inattentive.  Mood and Affect: Mood is elated. Affect is labile and inappropriate.        Speech: Speech is rapid and pressured and tangential.        Behavior: Behavior is agitated.        Thought Content: Thought content is paranoid. Thought content does not include homicidal or suicidal ideation.        Cognition and Memory: Cognition is impaired. Memory is impaired.        Judgment: Judgment is impulsive and inappropriate.    Review of Systems  Constitutional: Negative.   HENT: Negative.   Eyes: Negative.   Respiratory: Negative.   Cardiovascular: Negative.   Gastrointestinal: Negative.   Musculoskeletal: Negative.   Skin: Negative.   Neurological: Negative.   Psychiatric/Behavioral: Negative for depression, hallucinations, memory loss, substance abuse and suicidal ideas. The patient has insomnia. The patient is not nervous/anxious.    Blood pressure 131/89, pulse 91, temperature 97.9 F (36.6 C), temperature source Oral, resp. rate 18, height 5\' 4"  (1.626 m), weight 68 kg, last menstrual period 09/20/2018, SpO2 99 %. Body mass index is 25.75 kg/m.  Treatment Plan Summary: Plan This is a 50 year old woman who presents under IVC that alleges symptoms that would be  typical of mania and aggressive behavior.  On interview she presents with a very manic picture as noted above.  Flight of ideas disorganized hyper religious hyperverbal agitated.  No known past history of mental illness.  I looked at the controlled substance database and there is no report of any controlled substance prescriptions.  Full labs that is pending but so far unrevealing.  I am going to order a head CT as well.  Patient appears to be having a brief reactive psychosis or some kind of manic-like syndrome which would argue against discontinuing the IVC.  Instead recommend admission to psychiatric ward.  Orders will be placed Case reviewed with emergency room doctor and TTS.   Brief psychotic disorder: -Discontinued Seroquel 50 mg at bedtime -Started Saphris 5 mg once at bedtime tonight -Admit to inpatient psychiatric unit  Disposition: Recommend psychiatric Inpatient admission when medically cleared. Supportive therapy provided about ongoing stressors.  Waylan Boga, NP 01/03/2021 5:37 PM

## 2021-01-03 NOTE — ED Notes (Signed)
Patient continue to come to nursing station banging on glass window demanding her rights. Patient redirected to her room. Snacks and drinks provided.

## 2021-01-03 NOTE — ED Notes (Addendum)
Pt up to RN station, attempting to engage pt in conversation about her job and love of flowers. Pt is able to focus on conversation for short period of time before she begins to accuse this RN of "enforcing the rules" and being "an accessory" to her IVC. Pt informed at this time will need to end conversation and encourage pt to return to her room. Pt offered medication for anxiety, but pt refuses. Pt yelling, disturbing other patients. Pt encouraged to keep her voice down, others are resting.

## 2021-01-03 NOTE — ED Notes (Signed)
2144 vital signs entered in error.

## 2021-01-03 NOTE — ED Notes (Signed)
PT given breakfast tray  

## 2021-01-03 NOTE — ED Notes (Signed)
Pt up in restroom singing loudly.

## 2021-01-03 NOTE — ED Notes (Signed)
Report from jeanette, rn.  

## 2021-01-03 NOTE — ED Notes (Signed)
Report to jeanette ramos, rn.

## 2021-01-03 NOTE — ED Notes (Signed)
Pt back up to station door. Water and graham crackers provided by security.

## 2021-01-03 NOTE — ED Notes (Signed)
Pt agitated, yelling in restroom. Other pt on unit yelling back at pt to "be quiet, shut up". Pt continues to refuse oral medication to help her relax.

## 2021-01-03 NOTE — ED Notes (Signed)
Pt up to restroom, pressing emergency bell. Pt yelling "help" in restroom for toilet paper. Pt has door locked. Pt informed to unlock door so toilet paper can be provided.  Toilet paper provided, pt states "are you stupid" and yelling other nonsensical things at this RN.

## 2021-01-03 NOTE — ED Provider Notes (Signed)
Emergency Medicine Observation Re-evaluation Note  Penny Chase is a 50 y.o. female, seen on rounds today.  Pt initially presented to the ED for complaints of No chief complaint on file. Currently, the patient is laying in her bed.  Reportedly just redirected to her bed by nursing, who indicates that she was requesting medications for sleep.Marland Kitchen  Physical Exam  BP 125/82 (BP Location: Left Arm)   Pulse 80   Temp 99.1 F (37.3 C) (Oral)   Resp 20   Ht 5\' 4"  (1.626 m)   Wt 68 kg   LMP 09/20/2018 (Exact Date)   SpO2 98%   BMI 25.75 kg/m  Physical Exam Constitutional:      Appearance: She is not ill-appearing or toxic-appearing.  HENT:     Head: Atraumatic.  Cardiovascular:     Comments: Well perfused Pulmonary:     Effort: Pulmonary effort is normal.  Abdominal:     General: There is no distension.  Musculoskeletal:        General: No deformity.  Skin:    Findings: No rash.  Neurological:     General: No focal deficit present.     Cranial Nerves: No cranial nerve deficit.      ED Course / MDM  EKG:   I have reviewed the labs performed to date as well as medications administered while in observation.  Recent changes in the last 24 hours include overnight agitation requiring frequent redirection.  Plan  Current plan is for inpatient psychiatric placement.  We will provide single voluntary IM dose of droperidol due to her continued symptoms of mania, at her request. Patient is under full IVC at this time.   Vladimir Crofts, MD 01/03/21 [phone]

## 2021-01-03 NOTE — BH Assessment (Signed)
Writer followed up with referrals:   [facility]rist[facility] ([phone]- [phone]), Unable to reach anyone   Rosana Hoes [phone]), Unable to reach anyone   Mikel Cella [phone], [phone], [phone] or [phone]), Unable to reach anyone   [facility] ([phone] or [phone]), No beds   [facility] ([place]remine-[phone]), Patient denied   [facility] (Phylisis-[phone] -or- [phone]), information refax.   [facility] Camel [phone]), Unable to reach anyone   [facility] [phone]), unable to reach anyone.

## 2021-01-03 NOTE — ED Notes (Signed)
Patient prently sleeping will continue to monitor.

## 2021-01-03 NOTE — ED Notes (Signed)
Patient slept 1 hour and is currently up at nurses station asking if doctor will come see her today, patient went backl into her room and laid in bed

## 2021-01-03 NOTE — ED Notes (Signed)
Pt back up to to door or RN station. Pt informed this RN is doing 15 min rounds now, asked pt "is there anything you need right now?" pt states "get me something carbonated, do something about my night sweats" then proceeds to call RN "the biggest bitch I know". Pt speaking loudly. Pt informed that this RN is going to check on other pt's momentarily and to please refrain from coming into that area and waking patients up with loud speech. Pt states "so you don't want me getting close to you" then spreads her arms out and states "I though you were gonna check me". Pt informed to please return to day room to prevent waking patients in room 6-8 up. Pt walks away and back into her room, states "that's the biggest bitch I know" repeatedly.

## 2021-01-03 NOTE — ED Notes (Signed)
Pt given dinner tray.

## 2021-01-03 NOTE — BH Assessment (Signed)
Writer spoke with the patient to complete an updated/reassessment. Patient continues to be disorganized and tangential.

## 2021-01-03 NOTE — ED Notes (Signed)
Patient back to nursing station requesting something to eat.eports her feet feel like she has spurs in them wants to know what we did to he feet, went back to her room.

## 2021-01-03 NOTE — ED Notes (Signed)
Patient very hyper-verbal knocking forcefully at nurses station windows, patient with no meaningful request. Patient knocking to express she need to get out of here, talking fast and being being repetitive." it time for her to go back to work, I need to use the bathroom, theres no toilet paper, I want some thing eat and drink, I am being held against my rights, this is the only way he meaning her husband can have control of her, he put me in here" Patient oriented to time and asked to go back to her room. All patient needs have been accommodated, Md made aware patient has not slept more that 1 hour over night patient was verbally abusive to night shitf nurse all night, cursing and calling her names all night as per report patient was also knocking on nursing door every 10 minutes.., did not sleep all day yesterday, Dr Tamala Julian made aware of patients behavior and inability to sleep despite her scheduled meds.

## 2021-01-03 NOTE — ED Notes (Signed)
Pt back up to RN station door, states "I'm bored get me something to do". Pt states 'do you have a bible" pt then informs RN "you need to bring you own bible here". Pt asking for books, crayons and coloring book. Pt provided with two paperback books of her choice.

## 2021-01-04 MED ORDER — OLANZAPINE 10 MG IM SOLR
10.0000 mg | Freq: Once | INTRAMUSCULAR | Status: AC
  Administered 2021-01-04: 10 mg via INTRAMUSCULAR

## 2021-01-04 MED ORDER — OLANZAPINE 5 MG PO TABS
5.0000 mg | ORAL_TABLET | Freq: Two times a day (BID) | ORAL | Status: DC
  Administered 2021-01-04 – 2021-01-05 (×2): 5 mg via ORAL
  Filled 2021-01-04 (×2): qty 1

## 2021-01-04 MED ORDER — ACETAMINOPHEN 325 MG PO TABS
650.0000 mg | ORAL_TABLET | Freq: Four times a day (QID) | ORAL | Status: DC | PRN
  Administered 2021-01-04 (×2): 650 mg via ORAL
  Filled 2021-01-04: qty 2

## 2021-01-04 MED ORDER — ACETAMINOPHEN 325 MG PO TABS
ORAL_TABLET | ORAL | Status: AC
  Filled 2021-01-04: qty 2

## 2021-01-04 MED ORDER — ASENAPINE MALEATE 5 MG SL SUBL
10.0000 mg | SUBLINGUAL_TABLET | Freq: Two times a day (BID) | SUBLINGUAL | Status: DC
  Filled 2021-01-04 (×3): qty 2

## 2021-01-04 MED ORDER — DOCUSATE SODIUM 100 MG PO CAPS
100.0000 mg | ORAL_CAPSULE | Freq: Two times a day (BID) | ORAL | Status: DC
  Administered 2021-01-04 – 2021-01-06 (×4): 100 mg via ORAL
  Filled 2021-01-04 (×3): qty 1

## 2021-01-04 NOTE — ED Notes (Signed)
Patient out bed to door screaming for something to drink and went back to bed. Presently just laying in her bed

## 2021-01-04 NOTE — ED Notes (Signed)
Patient in bed quietly with covers over her head/

## 2021-01-04 NOTE — ED Notes (Signed)
Report to jeanette, rn.  

## 2021-01-04 NOTE — ED Notes (Signed)
Pt up to station door asking if RN "do you like to write, you could write a thesis and study me". Pt continues with flight of idea conversation then waves arms and walks away.

## 2021-01-04 NOTE — ED Notes (Signed)
Patient up all night knocking at the nursing station requesting and demanding things. As per prior nurse patient must have slept 1 hour despite being given her night medications. Will continue to monitor

## 2021-01-04 NOTE — Consult Note (Signed)
[facility] Consult   Reason for Consult: Consult for 50 year old woman brought in under IVC filed by her husband reporting psychotic symptoms Referring Physician: Archie Balboa Patient Identification: Penny Chase MRN:  [id number] Principal Diagnosis: Brief psychotic disorder [facility]) Diagnosis:  Principal Problem:   Brief psychotic disorder ([place])   Total Time spent with patient: 30 minutes  Subjective:   Penny Chase is a 50 y.o. female patient admitted with "I've been sleeping".  50 yo female with mania symptoms continuing this morning, minimal sleep last night.  She was very agitated and banging on the windows of the [place], yelling, and not able to be redirected.  Zyprexa 10 mg IM given with good results of briefly sleeping and remaining calm most of the day.  Started Zyprexa 5 mg BID which may need to be increased. Continues to meet inpatient psychiatric criteria.  This provider and TTS, Ian Malkin, spoke with her husband and explained her progress and plan for admission.  He stated her symptoms started 15 weeks ago and due to her behavior changes, her chemotherapy was stopped.  She lost her job for her mood issues and unable to obtain employment.  Frequent outbursts at home with many threats towards him.  He contributes many of her current issues to 9 people dying that were close to her in the past 18 months:  Both parents, her best friend from [place], and other loved ones.  Now it [place] and her [place] as her sister and brother live out-of-town.  These losses along with her breast cancer he feels are contributing to her symptoms.  He is currently in the ED with chest pain but wants to be updated regularly on her progress.    Dr Weber Cooks on admission 5/20: HPI: Patient seen chart reviewed.  50 year old woman without very much of a past psychiatric history.  Commitment papers filed by her husband report that she is having mood swings that are causing her to become  combative and physically aggressive towards her husband.  Also claims that she has abused alcohol and pain medicine.  On interview the patient was agitated hyperverbal and largely uncooperative.  She displays pressured speech and is hyper religious combative and flirtatious.  She expresses hostility towards her husband.  She admits to having been aggressive towards him but claims he started all of it.  She denies alcohol or drug use.  Patient says she has been up since 3:00 in the morning but claims that she goes to bed around 9 so she is sleeping 6 hours or more a night.  Husband's petition says that she is only sleeping about 2 hours a night.  Review of medical records indicates that her cancer doctor did recognize that she has had some changes in her mental status and discontinued her tamoxifen as a result.  Past Psychiatric History: Patient has a past history of being treated with citalopram after her breast cancer.  No previous diagnosis of bipolar disorder or major mood disorder.  No history of psychiatric hospitalization and no history of suicide attempts no previous history of psychosis or substance abuse  Risk to Self:   Risk to Others:   Prior Inpatient Therapy:   Prior Outpatient Therapy:    Past Medical History:  Past Medical History:  Diagnosis Date  . Breast cancer ([place])   . Cancer ([place]) 09/2018   Right breast  . COVID-19 11/2018  . Family history of breast cancer   . Hypertension   .  Personal history of radiation therapy 2020   Rt breast    Past Surgical History:  Procedure Laterality Date  . BREAST BIOPSY Right 09/12/2018   [place] core/"venus clip"-INVASIVE MAMMARYLobular CARCINOMA, WITH LOBULAR FEATURES  . BREAST LUMPECTOMY Right 10/04/2018  . BREAST LUMPECTOMY WITH NEEDLE LOCALIZATION AND AXILLARY SENTINEL LYMPH NODE BX Right 10/04/2018   Procedure: BREAST LUMPECTOMY WITH NEEDLE LOCALIZATION AND AXILLARY SENTINEL LYMPH NODE BX;  Surgeon: Herbert Pun, MD;  Location:  [facility] ORS;  Service: General;  Laterality: Right;  . TONSILLECTOMY     Family History:  Family History  Problem Relation Age of Onset  . Breast cancer Paternal Aunt    Family Psychiatric  History: Denies any Social History:  Social History   Substance and Sexual Activity  Alcohol Use Yes   Comment: OCCAS     Social History   Substance and Sexual Activity  Drug Use No    Social History   Socioeconomic History  . Marital status: Married    Spouse name: Not on file  . Number of children: Not on file  . Years of education: Not on file  . Highest education level: Not on file  Occupational History  . Not on file  Tobacco Use  . Smoking status: Current Every Day Smoker  . Smokeless tobacco: Never Used  Vaping Use  . Vaping Use: Former  Substance and Sexual Activity  . Alcohol use: Yes    Comment: OCCAS  . Drug use: No  . Sexual activity: Not on file  Other Topics Concern  . Not on file  Social History Narrative   Works at [employer];  Smoke 15 cig/day; No alcohol abuse; in [employer]; with husband.    Social Determinants of Health   Financial Resource Strain: Not on file  Food Insecurity: Not on file  Transportation Needs: Not on file  Physical Activity: Not on file  Stress: Not on file  Social Connections: Not on file   Additional Social History:    Allergies:  No Known Allergies  Labs:  No results found for this or any previous visit (from the past 48 hour(s)).  Current Facility-Administered Medications  Medication Dose Route Frequency Provider Last Rate Last Admin  . acetaminophen (TYLENOL) tablet 650 mg  650 mg Oral Q6H PRN Rudene Re, MD   650 mg at 01/04/21 0518  . OLANZapine (ZYPREXA) tablet 5 mg  5 mg Oral BID Patrecia Pour, NP       Current Outpatient Medications  Medication Sig Dispense Refill  . acetaminophen (TYLENOL) 500 MG tablet Take 500 mg by mouth every 6 (six) hours as needed for moderate pain.    Marland Kitchen amLODipine  (NORVASC) 5 MG tablet Take 5 mg by mouth daily.    Marland Kitchen aspirin-sod bicarb-citric acid (ALKA-SELTZER) 325 MG TBEF tablet Take 325 mg by mouth every 6 (six) hours as needed (indigestion).    . citalopram (CELEXA) 20 MG tablet TAKE 1 TABLET BY MOUTH EVERY DAY 30 tablet 3  . pantoprazole (PROTONIX) 40 MG tablet Take 40 mg by mouth daily as needed (heartburn).     . tamoxifen (NOLVADEX) 20 MG tablet TAKE 1 TABLET BY MOUTH EVERY DAY 90 tablet 3    Musculoskeletal: Strength & Muscle Tone: within normal limits Gait & Station: normal Patient leans: N/A  Psychiatric Specialty Exam: Physical Exam Vitals and nursing note reviewed.  Constitutional:      Appearance: Normal appearance.  HENT:     Head: Normocephalic and atraumatic.  Mouth/Throat:     Pharynx: Oropharynx is clear.  Eyes:     Pupils: Pupils are equal, round, and reactive to light.  Cardiovascular:     Rate and Rhythm: Normal rate and regular rhythm.  Pulmonary:     Effort: Pulmonary effort is normal.     Breath sounds: Normal breath sounds.  Abdominal:     General: Abdomen is flat.     Palpations: Abdomen is soft.  Musculoskeletal:        General: Normal range of motion.  Skin:    General: Skin is warm and dry.  Neurological:     General: No focal deficit present.     Mental Status: She is alert. Mental status is at baseline.  Psychiatric:        Attention and Perception: She is inattentive.        Mood and Affect: Mood is elated. Affect is labile and inappropriate.        Speech: Speech is rapid and pressured and tangential.        Behavior: Behavior is agitated.        Thought Content: Thought content is paranoid. Thought content does not include homicidal or suicidal ideation.        Cognition and Memory: Cognition is impaired. Memory is impaired.        Judgment: Judgment is impulsive and inappropriate.     Review of Systems  Constitutional: Negative.   HENT: Negative.   Eyes: Negative.   Respiratory:  Negative.   Cardiovascular: Negative.   Gastrointestinal: Negative.   Musculoskeletal: Negative.   Skin: Negative.   Neurological: Negative.   Psychiatric/Behavioral: Negative for depression, hallucinations, memory loss, substance abuse and suicidal ideas. The patient has insomnia. The patient is not nervous/anxious.     Blood pressure 132/76, pulse 72, temperature 97.8 F (36.6 C), temperature source Oral, resp. rate 17, height 5\' 4"  (1.626 m), weight 68 kg, last menstrual period 09/20/2018, SpO2 99 %.Body mass index is 25.75 kg/m.  General Appearance: Disheveled  Eye Contact:  Fair  Speech:  Normal Rate  Volume:  Increased  Mood:  Anxious  Affect:  Congruent  Thought Process:  Descriptions of Associations: Tangential  Orientation:  Other:  person and place  Thought Content:  Tangential  Suicidal Thoughts:  No  Homicidal Thoughts:  No  Memory:  Immediate;   Fair Recent;   Fair Remote;   Fair  Judgement:  Poor  Insight:  Lacking  Psychomotor Activity:  Increased  Concentration:  Concentration: Fair and Attention Span: Fair  Recall:  [employer] of Knowledge:  Fair  Language:  Good  Akathisia:  No  Handed:  Right  AIMS (if indicated):     Assets:  Housing Intimacy Leisure Time Resilience Social Support  ADL's:  Intact  Cognition:  Impaired,  Mild  Sleep:        Physical Exam: Physical Exam Vitals and nursing note reviewed.  Constitutional:      Appearance: Normal appearance.  HENT:     Head: Normocephalic and atraumatic.     Mouth/Throat:     Pharynx: Oropharynx is clear.  Eyes:     Pupils: Pupils are equal, round, and reactive to light.  Cardiovascular:     Rate and Rhythm: Normal rate and regular rhythm.  Pulmonary:     Effort: Pulmonary effort is normal.     Breath sounds: Normal breath sounds.  Abdominal:     General: Abdomen is flat.     Palpations:  Abdomen is soft.  Musculoskeletal:        General: Normal range of motion.  Skin:    General: Skin  is warm and dry.  Neurological:     General: No focal deficit present.     Mental Status: She is alert. Mental status is at baseline.  Psychiatric:        Attention and Perception: She is inattentive.        Mood and Affect: Mood is elated. Affect is labile and inappropriate.        Speech: Speech is rapid and pressured and tangential.        Behavior: Behavior is agitated.        Thought Content: Thought content is paranoid. Thought content does not include homicidal or suicidal ideation.        Cognition and Memory: Cognition is impaired. Memory is impaired.        Judgment: Judgment is impulsive and inappropriate.    Review of Systems  Constitutional: Negative.   HENT: Negative.   Eyes: Negative.   Respiratory: Negative.   Cardiovascular: Negative.   Gastrointestinal: Negative.   Musculoskeletal: Negative.   Skin: Negative.   Neurological: Negative.   Psychiatric/Behavioral: Negative for depression, hallucinations, memory loss, substance abuse and suicidal ideas. The patient has insomnia. The patient is not nervous/anxious.    Blood pressure 132/76, pulse 72, temperature 97.8 F (36.6 C), temperature source Oral, resp. rate 17, height 5\' 4"  (1.626 m), weight 68 kg, last menstrual period 09/20/2018, SpO2 99 %. Body mass index is 25.75 kg/m.  Treatment Plan Summary: Plan This is a 50 year old woman who presents under IVC that alleges symptoms that would be typical of mania and aggressive behavior.  On interview she presents with a very manic picture as noted above.  Flight of ideas disorganized hyper religious hyperverbal agitated.  No known past history of mental illness.  I looked at the controlled substance database and there is no report of any controlled substance prescriptions.  Full labs that is pending but so far unrevealing.  I am going to order a head CT as well.  Patient appears to be having a brief reactive psychosis or some kind of manic-like syndrome which would argue  against discontinuing the IVC.  Instead recommend admission to psychiatric ward.  Orders will be placed Case reviewed with emergency room doctor and TTS.   Brief psychotic disorder: -Started Zyprexa 5 mg BID -Admit to inpatient psychiatric unit  Disposition: Recommend psychiatric Inpatient admission when medically cleared. Supportive therapy provided about ongoing stressors.  Waylan Boga, NP 01/04/2021 7:58 PM

## 2021-01-04 NOTE — ED Notes (Signed)
Pt up, awake again to nurse's station. Pt states "lets' go hook up the oxen and plow a field". Pt asking RN where she lives and requesting water. Pt provided with another cup of water. Pt encouraged to go back to bed and attempt to rest.

## 2021-01-04 NOTE — ED Notes (Signed)
Patient sleeping

## 2021-01-04 NOTE — ED Provider Notes (Signed)
Emergency Medicine Observation Re-evaluation Note  Penny Chase is a 50 y.o. female, seen on rounds today.  Pt initially presented to the ED for complaints of No chief complaint on file.  Currently, the patient is calm, no acute complaints.  Physical Exam  Blood pressure 140/82, pulse 66, temperature 98.7 F (37.1 C), temperature source Oral, resp. rate 16, height 5\' 4"  (1.626 m), weight 68 kg, last menstrual period 09/20/2018, SpO2 99 %. Physical Exam General: NAD Lungs: CTAB Psych: not agitated  ED Course / MDM  EKG:    I have reviewed the labs performed to date as well as medications administered while in observation.  Recent changes in the last 24 hours include no acute events overnight.    Plan  Current plan is for inpatient psychiatric treatment. Patient is under full IVC at this time.   Carrie Mew, MD 01/04/21 [phone]

## 2021-01-04 NOTE — ED Notes (Signed)
Pt states she has a headache, order for tylenol received. Pt up to door, requesting toilet paper, pt informed an entire roll of paper in in bathroom. Pt states 'this ain't the first time you've lied to me, Abir Craine". This rn showed pt in person where toilet paper is in bathroom. Pt continues to speak very loudly and agressively. Pt informed to lower voice others are trying to sleep.

## 2021-01-04 NOTE — ED Notes (Signed)
Patient knocking on door requesting to go home," being held against her will".

## 2021-01-04 NOTE — ED Notes (Signed)
Pt up to nurse's station door knocking, answered pt's knock. Pt states "what university did you get your degree from?" pt informed answer of question. Pt then states "you need to chill out, now get on with you" waving arms and walking away into restroom.

## 2021-01-04 NOTE — BH Assessment (Signed)
Referral checks:    Penny Chase [phone]- [phone]), Unable to reach anyone   Penny Chase ([phone]), Left voicemail, requesting a call back.   Penny Chase [phone], [phone], [phone] or [phone]), Unable to reach anyone   Penny Chase ([phone] or [phone]), Left a voicemail requesting a callback.    Penny Chase [phone] -[phone]), Pt has yet to be reviewed; advised this Probation officer to try again later.   Penny Chase [phone]), Unable to reach anyone   Penny Chase [phone]), unable to reach anyone.

## 2021-01-04 NOTE — ED Notes (Signed)
Pt up knocking on station door again. Pt asking RN personal questions about marriage, pt had previously asked RN about sexual orientation and education. Pt informed that will not discuss personal life with pt. Pt states "I want you to know me". Pt informed to please refrain from knocking on door for another hour unless it is a personal need. Pt verbalizes understanding.

## 2021-01-04 NOTE — BH Assessment (Signed)
Writer and Psych Nurse Practitioner Roselyn Reef L.) spoke with patient's husband ([phone]) and updated him.  Within the last four years, she lost nine love ones. Both her parents, siblings and other love ones. Most recent death was approximately six months ago and it was her best friend. Husband noticed a change in her behaviors approximately fifteen weeks ago, when her sleep started to decrease.

## 2021-01-04 NOTE — ED Notes (Signed)
Pt into another pt's room without permission. Pt removed from room by security.

## 2021-01-04 NOTE — ED Notes (Signed)
Im meds given. Patient refused het po scheduled med. NP d/c oral and prescribed IM dose. IM administered. Will continue to monitor.

## 2021-01-04 NOTE — BH Assessment (Signed)
Reassessment Patient continues to be manic, difficult to redirect and intrusive.

## 2021-01-04 NOTE — ED Notes (Signed)
Patient back to nursing station banging  on glass door

## 2021-01-04 NOTE — ED Notes (Signed)
Patient at nursing station requesting her temp to be checked. Reports she has tics under her skin. Patient Temp 97.8.

## 2021-01-04 NOTE — ED Notes (Signed)
Patient to nursing station " stated that she wants to go home she is being held against her will. : stated she is going to throw herself on floor and act like she is having a seizure to since that's what it takes to get her out of here".

## 2021-01-05 MED ORDER — ALBUTEROL SULFATE HFA 108 (90 BASE) MCG/ACT IN AERS
2.0000 | INHALATION_SPRAY | RESPIRATORY_TRACT | Status: DC | PRN
  Filled 2021-01-05: qty 6.7

## 2021-01-05 MED ORDER — LORAZEPAM 2 MG PO TABS
2.0000 mg | ORAL_TABLET | Freq: Once | ORAL | Status: AC
  Administered 2021-01-05: 2 mg via ORAL
  Filled 2021-01-05: qty 1

## 2021-01-05 MED ORDER — HYDROCHLOROTHIAZIDE 25 MG PO TABS
25.0000 mg | ORAL_TABLET | ORAL | Status: AC
  Administered 2021-01-05: 25 mg via ORAL
  Filled 2021-01-05: qty 1

## 2021-01-05 MED ORDER — CITALOPRAM HYDROBROMIDE 20 MG PO TABS
20.0000 mg | ORAL_TABLET | Freq: Every day | ORAL | Status: DC
  Filled 2021-01-05: qty 1

## 2021-01-05 MED ORDER — OLANZAPINE 10 MG PO TABS
10.0000 mg | ORAL_TABLET | Freq: Two times a day (BID) | ORAL | Status: DC
  Administered 2021-01-05 – 2021-01-06 (×2): 10 mg via ORAL
  Filled 2021-01-05 (×2): qty 1

## 2021-01-05 MED ORDER — POTASSIUM CHLORIDE CRYS ER 20 MEQ PO TBCR
40.0000 meq | EXTENDED_RELEASE_TABLET | Freq: Once | ORAL | Status: AC
  Administered 2021-01-05: 40 meq via ORAL
  Filled 2021-01-05: qty 2

## 2021-01-05 NOTE — BH Assessment (Signed)
Patient is to be admitted to [facility] by Dr. Domingo Cocking.  Attending Physician will be Dr. Weber Cooks.   Patient has been assigned to room 316, by [place].     ER staff is aware of the admission:  Lattie Haw, ER Secretary    Dr.Siadecki, ER MD   Amy, Patient's Nurse   Helene Kelp, Patient Access.

## 2021-01-05 NOTE — ED Provider Notes (Signed)
Patient reports she is supposed to be on hydrochlorothiazide for blood pressure management.  She is not currently taking the medications listed in her previous med rec, reports has been changed to HCTZ.  She goes to [facility], I do not have direct access to confirm notes and doses, but I requested nurse Seth Bake contact them when open so we can complete a appropriate med rec  Med rec performed and meds ordered    Delman Kitten, MD 01/05/21 1200

## 2021-01-05 NOTE — ED Notes (Signed)
IVC/  PENDING  PLACEMENT 

## 2021-01-05 NOTE — ED Notes (Signed)
Dinner meal given to pt

## 2021-01-05 NOTE — ED Notes (Signed)
Hourly rounding reveals patient in room. No complaints, stable, in no acute distress. Q15 minute rounds and monitoring via Security Cameras to continue. 

## 2021-01-05 NOTE — ED Notes (Signed)
Lunch provided for pt in room.

## 2021-01-05 NOTE — ED Notes (Signed)
Pt to door asking for milk. Provided in cup. Pt smiling and cooperative. Pt states "I am just happy all the time."

## 2021-01-05 NOTE — ED Notes (Signed)
Dr. Jacqualine Code in dayroom to speak with pt. Pt cooperative at this time.

## 2021-01-05 NOTE — ED Notes (Signed)
Report to include Situation, Background, Assessment, and Recommendations received from Amy RN. Patient alert and oriented, warm and dry, in no acute distress. Patient denies SI, HI, AVH and pain. Patient made aware of Q15 minute rounds and security cameras for their safety. Patient instructed to come to me with needs or concerns.

## 2021-01-05 NOTE — ED Notes (Signed)
IVC pending placement 

## 2021-01-05 NOTE — ED Provider Notes (Signed)
Emergency Medicine Observation Re-evaluation Note  Penny Chase is a 50 y.o. female, seen on rounds today.  Pt initially presented to the ED for complaints of No chief complaint on file. Currently, the patient is under IVC.   Physical Exam  BP 132/76 (BP Location: Right Arm)   Pulse 72   Temp 97.8 F (36.6 C) (Oral)   Resp 17   Ht 5\' 4"  (1.626 m)   Wt 68 kg   LMP 09/20/2018 (Exact Date)   SpO2 99%   BMI 25.75 kg/m  Physical Exam General: Resting in chair in the day room.  Fully awake alert and oriented.  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  She also reports some hostility towards her husband, but does not make direct threats.  ED Course / MDM  EKG:   I have reviewed the labs performed to date as well as medications administered while in observation.  Recent changes in the last 24 hours include seen by psychiatry who are currently thinking likely admission to the psychiatric ward.  Plan  Current plan is for psychiatric admission though final consult note is pending at this point but it appears likely leaning toward psychiatric admission. Patient is under full IVC at this time.   Delman Kitten, MD 01/05/21 [id number]

## 2021-01-05 NOTE — ED Notes (Signed)
Snack and beverage given. 

## 2021-01-05 NOTE — ED Notes (Signed)
Resumed care from andrea rn.  Pt alert Pt lying on bed watchig tv.

## 2021-01-05 NOTE — ED Notes (Signed)
Pt constantly asking for drinks and going to bathroom to void.

## 2021-01-05 NOTE — ED Notes (Signed)
Pt to nurses station asking for her shower supplies. Provided for pt by ED tech.

## 2021-01-05 NOTE — ED Notes (Signed)
Gave pt supplies to take shower. Gave fresh sheets, blanket, pillow case, and fresh clothes. After shower pt gave showers supplies and dirty clothes to tech.

## 2021-01-05 NOTE — ED Notes (Signed)
Snack provided

## 2021-01-05 NOTE — ED Notes (Signed)
Breakfast provided in pt room.

## 2021-01-05 NOTE — ED Notes (Addendum)
Pt husband in room for visit with pt relations Lanny Hurst).

## 2021-01-06 ENCOUNTER — Inpatient Hospital Stay
Admission: AD | Admit: 2021-01-06 | Discharge: 2021-01-15 | DRG: 885 | Disposition: A | Discharge status: Home / Self Care | Payer: 59 | Source: Intra-hospital | Attending: Behavioral Health | Admitting: Behavioral Health

## 2021-01-06 ENCOUNTER — Other Ambulatory Visit: Payer: Self-pay

## 2021-01-06 ENCOUNTER — Encounter: Payer: Self-pay | Admitting: Psychiatry

## 2021-01-06 DIAGNOSIS — F29 Unspecified psychosis not due to a substance or known physiological condition: Secondary | ICD-10-CM | POA: Diagnosis present

## 2021-01-06 DIAGNOSIS — G4700 Insomnia, unspecified: Secondary | ICD-10-CM | POA: Diagnosis present

## 2021-01-06 DIAGNOSIS — F1210 Cannabis abuse, uncomplicated: Secondary | ICD-10-CM | POA: Diagnosis present

## 2021-01-06 DIAGNOSIS — F17210 Nicotine dependence, cigarettes, uncomplicated: Secondary | ICD-10-CM | POA: Diagnosis present

## 2021-01-06 DIAGNOSIS — F3110 Bipolar disorder, current episode manic without psychotic features, unspecified: Secondary | ICD-10-CM | POA: Diagnosis present

## 2021-01-06 DIAGNOSIS — F3113 Bipolar disorder, current episode manic without psychotic features, severe: Secondary | ICD-10-CM | POA: Diagnosis not present

## 2021-01-06 DIAGNOSIS — C50911 Malignant neoplasm of unspecified site of right female breast: Secondary | ICD-10-CM | POA: Diagnosis present

## 2021-01-06 DIAGNOSIS — K219 Gastro-esophageal reflux disease without esophagitis: Secondary | ICD-10-CM | POA: Diagnosis present

## 2021-01-06 DIAGNOSIS — Z8616 Personal history of COVID-19: Secondary | ICD-10-CM | POA: Diagnosis not present

## 2021-01-06 DIAGNOSIS — Z79899 Other long term (current) drug therapy: Secondary | ICD-10-CM

## 2021-01-06 DIAGNOSIS — F319 Bipolar disorder, unspecified: Secondary | ICD-10-CM | POA: Diagnosis present

## 2021-01-06 DIAGNOSIS — I10 Essential (primary) hypertension: Secondary | ICD-10-CM | POA: Diagnosis present

## 2021-01-06 DIAGNOSIS — Z923 Personal history of irradiation: Secondary | ICD-10-CM | POA: Diagnosis not present

## 2021-01-06 DIAGNOSIS — Z803 Family history of malignant neoplasm of breast: Secondary | ICD-10-CM | POA: Diagnosis not present

## 2021-01-06 MED ORDER — MAGNESIUM HYDROXIDE 400 MG/5ML PO SUSP: 30.0000 mL | Freq: Every day | ORAL | Status: DC | PRN

## 2021-01-06 MED ORDER — OLANZAPINE 10 MG PO TABS
10.0000 mg | ORAL_TABLET | Freq: Two times a day (BID) | ORAL | Status: DC
  Administered 2021-01-06 – 2021-01-07 (×2): 10 mg via ORAL
  Filled 2021-01-06 (×2): qty 1

## 2021-01-06 MED ORDER — ALUM & MAG HYDROXIDE-SIMETH 200-200-20 MG/5ML PO SUSP
30.0000 mL | ORAL | Status: DC | PRN
  Filled 2021-01-06: qty 30

## 2021-01-06 MED ORDER — ALUM & MAG HYDROXIDE-SIMETH 200-200-20 MG/5ML PO SUSP: 30.0000 mL | ORAL | Status: DC | PRN

## 2021-01-06 MED ORDER — CALCIUM CARBONATE ANTACID 500 MG PO CHEW
1.0000 | CHEWABLE_TABLET | Freq: Once | ORAL | Status: AC
  Administered 2021-01-06: 200 mg via ORAL
  Filled 2021-01-06 (×2): qty 1

## 2021-01-06 MED ORDER — AMLODIPINE BESYLATE 5 MG PO TABS: 5.0000 mg | ORAL_TABLET | Freq: Every day | ORAL | Status: DC

## 2021-01-06 MED ORDER — ACETAMINOPHEN 325 MG PO TABS: 650.0000 mg | ORAL_TABLET | Freq: Four times a day (QID) | ORAL | Status: DC | PRN

## 2021-01-06 MED ORDER — MAGNESIUM HYDROXIDE 400 MG/5ML PO SUSP
30.0000 mL | Freq: Every day | ORAL | Status: DC | PRN
  Administered 2021-01-08: 30 mL via ORAL
  Filled 2021-01-06: qty 30

## 2021-01-06 MED ORDER — PANTOPRAZOLE SODIUM 40 MG PO TBEC: 40.0000 mg | DELAYED_RELEASE_TABLET | Freq: Every day | ORAL | Status: DC | PRN

## 2021-01-06 MED ORDER — ACETAMINOPHEN 325 MG PO TABS
650.0000 mg | ORAL_TABLET | Freq: Four times a day (QID) | ORAL | Status: DC | PRN
  Administered 2021-01-08 – 2021-01-12 (×6): 650 mg via ORAL
  Filled 2021-01-06 (×6): qty 2

## 2021-01-06 MED ORDER — DOCUSATE SODIUM 100 MG PO CAPS
100.0000 mg | ORAL_CAPSULE | Freq: Two times a day (BID) | ORAL | Status: DC
  Administered 2021-01-06 – 2021-01-15 (×18): 100 mg via ORAL
  Filled 2021-01-06 (×18): qty 1

## 2021-01-06 NOTE — ED Notes (Signed)
Pt given tooth brush and tooth paste

## 2021-01-06 NOTE — ED Notes (Signed)
Pt discharged to BMU under IVC.  Belongings sent with pt.  VS stable.

## 2021-01-06 NOTE — Progress Notes (Addendum)
Patient is hyperactive and has trouble sleeping. She was given Trazodone 100 mg PO but this medication was not effective. Ativan 1mg  PO was also given to patient which was also not effective. Patient is hyper-verbal and and speaks with loose associations. Will speak about bizarre topics with staff. She denies SI, HI, and AVH. Will continue to monitor with 15 minute safety checks.

## 2021-01-06 NOTE — ED Notes (Signed)
Pt's husband here to visit.  Patient relations monitoring visit.

## 2021-01-06 NOTE — ED Notes (Signed)
Hourly rounding reveals patient in room. No complaints, stable, in no acute distress. Q15 minute rounds and monitoring via Security Cameras to continue. 

## 2021-01-06 NOTE — ED Notes (Signed)
Pt has taken a shower. Clean scrubs, underwear and socks provided.

## 2021-01-06 NOTE — ED Notes (Signed)
Breakfast tray given. °

## 2021-01-06 NOTE — ED Notes (Signed)
VS not taken, patient asleep 

## 2021-01-06 NOTE — ED Notes (Signed)
All shower supplies returned to nurse.  Shower has been checked and is empty.

## 2021-01-06 NOTE — ED Notes (Signed)
Pt ambulated to bathroom.  Milk given per patient request.

## 2021-01-06 NOTE — Tx Team (Signed)
Initial Treatment Plan 01/06/2021 5:09 PM MERINDA VICTORINO [id number]    PATIENT STRESSORS: Health problems Marital or family conflict Substance abuse   PATIENT STRENGTHS: Average or above average intelligence Communication skills Motivation for treatment/growth Supportive family/friends   PATIENT IDENTIFIED PROBLEMS: Bizarre behavior   Substance abuse  Disorganized thoughts                 DISCHARGE CRITERIA:  Ability to meet basic life and health needs Adequate post-discharge living arrangements Medical problems require only outpatient monitoring Verbal commitment to aftercare and medication compliance  PRELIMINARY DISCHARGE PLAN: Attend aftercare/continuing care group Return to previous living arrangement  PATIENT/FAMILY INVOLVEMENT: This treatment plan has been presented to and reviewed with the patient, MIKEISHA LEMONDS, and/or family member,.  The patient and family have been given the opportunity to ask questions and make suggestions.  Merlene Morse, RN 01/06/2021, 5:09 PM

## 2021-01-06 NOTE — BH Assessment (Signed)
Patient has been accepted to [facility]U today 01/06/21 at 2:30pm. ** Updated Room# 309 **  Patient's Nurse, Amy and Nurse Sec, Nitchia have both been informed.

## 2021-01-06 NOTE — ED Notes (Signed)
Pt allowed to use the phone.

## 2021-01-06 NOTE — BH Assessment (Signed)
**   UPDATE **  Patient will move to [facility] BM[facility] later today after 7:30pm.

## 2021-01-07 DIAGNOSIS — F3113 Bipolar disorder, current episode manic without psychotic features, severe: Secondary | ICD-10-CM | POA: Diagnosis not present

## 2021-01-07 LAB — LIPID PANEL
Cholesterol: 224 mg/dL — ABNORMAL HIGH (ref 0–200)
HDL: 56 mg/dL (ref >40)
LDL Cholesterol: 151 mg/dL — ABNORMAL HIGH (ref 0–99)
Total CHOL/HDL Ratio: 4 RATIO
Triglycerides: 85 mg/dL (ref <150)
VLDL: 17 mg/dL (ref 0–40)

## 2021-01-07 LAB — HEMOGLOBIN A1C
Hgb A1c MFr Bld: 5.3 % (ref 4.8–5.6)
Mean Plasma Glucose: 105 mg/dL

## 2021-01-07 LAB — TSH: TSH: 2.667 u[IU]/mL (ref 0.350–4.500)

## 2021-01-07 MED ORDER — HYDROXYZINE HCL 50 MG PO TABS
50.0000 mg | ORAL_TABLET | Freq: Three times a day (TID) | ORAL | Status: DC | PRN
  Administered 2021-01-07 – 2021-01-12 (×7): 50 mg via ORAL
  Filled 2021-01-07 (×7): qty 1

## 2021-01-07 MED ORDER — ENSURE ENLIVE PO LIQD
237.0000 mL | Freq: Three times a day (TID) | ORAL | Status: DC
  Administered 2021-01-07 – 2021-01-14 (×19): 237 mL via ORAL

## 2021-01-07 MED ORDER — TRAZODONE HCL 100 MG PO TABS
100.0000 mg | ORAL_TABLET | Freq: Every evening | ORAL | Status: DC | PRN
  Administered 2021-01-07: 100 mg via ORAL
  Filled 2021-01-07: qty 1

## 2021-01-07 MED ORDER — TEMAZEPAM 15 MG PO CAPS
15.0000 mg | ORAL_CAPSULE | Freq: Every evening | ORAL | Status: DC | PRN
  Administered 2021-01-07 – 2021-01-14 (×7): 15 mg via ORAL
  Filled 2021-01-07 (×7): qty 1

## 2021-01-07 MED ORDER — PANTOPRAZOLE SODIUM 40 MG PO TBEC
40.0000 mg | DELAYED_RELEASE_TABLET | Freq: Every day | ORAL | Status: DC
  Administered 2021-01-08 – 2021-01-15 (×8): 40 mg via ORAL
  Filled 2021-01-07 (×8): qty 1

## 2021-01-07 MED ORDER — LORAZEPAM 1 MG PO TABS
1.0000 mg | ORAL_TABLET | Freq: Once | ORAL | Status: AC
  Administered 2021-01-07: 1 mg via ORAL
  Filled 2021-01-07: qty 1

## 2021-01-07 MED ORDER — ALBUTEROL SULFATE HFA 108 (90 BASE) MCG/ACT IN AERS
1.0000 | INHALATION_SPRAY | RESPIRATORY_TRACT | Status: DC | PRN
  Administered 2021-01-09 – 2021-01-14 (×5): 2 via RESPIRATORY_TRACT
  Administered 2021-01-15: 1 via RESPIRATORY_TRACT
  Filled 2021-01-07: qty 6.7

## 2021-01-07 MED ORDER — OLANZAPINE 5 MG PO TBDP
10.0000 mg | ORAL_TABLET | Freq: Two times a day (BID) | ORAL | Status: DC
  Administered 2021-01-07 – 2021-01-11 (×8): 10 mg via ORAL
  Filled 2021-01-07 (×8): qty 2

## 2021-01-07 NOTE — Plan of Care (Signed)
Patient states that she wants to " keep my lip zipped ". Patient is interfering in everyone else's business. Patient accepted her behavior and receptive with redirection.Patient does not try to take a nap today. Compliant with medications. ADLs maintained. Denies SI,HI and AVH. Support and encouragement given.
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
## 2021-01-07 NOTE — BHH Suicide Risk Assessment (Signed)
[facility] INPATIENT:  Family/Significant Other Suicide Prevention Education  Suicide Prevention Education:  Education Completed; Vonna Drafts, brother, [phone],  has been identified by the patient as the family member/significant other with whom the patient will be residing, and identified as the person(s) who will aid the patient in the event of a mental health crisis (suicidal ideations/suicide attempt).  With written consent from the patient, the family member/significant other has been provided the following suicide prevention education, prior to the and/or following the discharge of the patient.  The suicide prevention education provided includes the following:  Suicide risk factors  Suicide prevention and interventions  National Suicide Hotline telephone number  [facility] assessment telephone number  [facility] Emergency Assistance [place] and/or Residential Mobile Crisis Unit telephone number  Request made of family/significant other to:  Remove weapons (e.g., guns, rifles, knives), all items previously/currently identified as safety concern.    Remove drugs/medications (over-the-counter, prescriptions, illicit drugs), all items previously/currently identified as a safety concern.  The family member/significant other verbalizes understanding of the suicide prevention education information provided.  The family member/significant other agrees to remove the items of safety concern listed above.  CSW contacted pt's brother.  Brother reports that the patient has daughter, though the patient denied having children when completing her assessment.  He reports that the pt's daughter was adopted.  He reports that the pt's daughter and husband came to the home to discuss therapy and pt "became belligerent, told them that they're aren't welcome here and she called the sheriff". He further reports that once the sheriff arrived "he took her in because he ahd been  called out there one too many times".  He reports that the patient has been calling her husband "begging and pleading for him to come get her".  He reports that pt and her husband are estranged.  He denies that pt has access to any weapons, "unless her husband has some, but I would imagine they are locked up and she doesn't know how to use one".  He reports that the family has noticed some hyper-religious behaviors.  He reports that "all my information is coming from other family members".  He reports that the pt's change in behaviors began in early February when she quit her job of 20+ years.  He reports that after quitting the  Job she'd worked for 20+ years she worked [employer] for sometime but quit that as well, however, started with a Surveyor, mining recently.    Rozann Lesches 01/07/2021, 3:19 PM

## 2021-01-07 NOTE — Progress Notes (Signed)
Patient continues to be hyper verbal and intrusive at times.Verbalized that she could not sleep last night.Denies SI,HI and AVH. No aggressive behaviors noted.Compliant with medications.Appetite and energy level good. Support and encouragement given.

## 2021-01-07 NOTE — BHH Group Notes (Signed)
  LCSW Group Therapy Note     01/07/2021 2:26 PM     Type of Therapy/Topic:  Group Therapy:  Emotion Regulation     Participation Level:  Active     Description of Group:   The purpose of this group is to assist patients in learning to regulate negative emotions and experience positive emotions. Patients will be guided to discuss ways in which they have been vulnerable to their negative emotions. These vulnerabilities will be juxtaposed with experiences of positive emotions or situations, and patients will be challenged to use positive emotions to combat negative ones. Special emphasis will be placed on coping with negative emotions in conflict situations, and patients will process healthy conflict resolution skills.     Therapeutic Goals:  1.    Patient will identify two positive emotions or experiences to reflect on in order to balance out negative emotions  2.    Patient will label two or more emotions that they find the most difficult to experience  3.    Patient will demonstrate positive conflict resolution skills through discussion and/or role plays     Summary of Patient Progress:   Patient was present for the entirety of the group session. Patient was an active listener and participated in the topic of discussion, provided helpful advice to others, and added nuance to topic of conversation. She stated that "life is what you make it." She said that she feels safe and secure when she spends time at church and outside. She said it combats her negative emotions.   Therapeutic Modalities:   Cognitive Behavioral Therapy  Feelings Identification  Dialectical Behavioral Therapy   Jacquelin Krajewski [place], MSW, [place]  01/07/2021 2:26 PM

## 2021-01-07 NOTE — Progress Notes (Signed)
Recreation Therapy Notes  INPATIENT RECREATION THERAPY ASSESSMENT  Patient Details Name: Penny Chase MRN: [id number] DOB: [birth year] Today's Date: 01/07/2021       Information Obtained From: Patient  Able to Participate in Assessment/Interview: Yes  Patient Presentation: Responsive  Reason for Admission (Per Patient): Active Symptoms  Patient Stressors: Relationship,Family,Death  Coping Skills:   [place] (2+):  Individual - Reading,Nature - Vegetable gardening,Nature - Flower gardening,Nature - Lawn care,Music - Listen,Music - Singing,Music - Other (Comment)  Frequency of Recreation/Participation: Weekly  Awareness of Community Resources:  Yes  Community Resources:  Library  Current Use: Yes  If no, Barriers?:    Expressed Interest in [place]: No  County of Residence:  [place] under[place]  Patient Main Form of Transportation: Musician  Patient Strengths:  Compassionate  Patient Identified Areas of Improvement:  Zip my lip when I need to  Patient Goal for Hospitalization:  Go back to being that quiet person I was before.  Current SI (including self-harm):  No  Current HI:  No  Current AVH: No  Staff Intervention Plan: Group Attendance,Collaborate with Interdisciplinary Treatment Team  Consent to Intern Participation: N/A  Penny Chase 01/07/2021, 4:15 PM

## 2021-01-07 NOTE — Tx Team (Addendum)
Interdisciplinary Treatment and Diagnostic Plan Update  01/07/2021 Time of Session: 9:00AM Penny Chase MRN: [id number]  Principal Diagnosis: <principal problem not specified>  Secondary Diagnoses: Active Problems:   Bipolar affective disorder, current episode manic (HCC)   Psychosis ([place])   Current Medications:  Current Facility-Administered Medications  Medication Dose Route Frequency Provider Last Rate Last Admin  . acetaminophen (TYLENOL) tablet 650 mg  650 mg Oral Q6H PRN Clapacs, John T, MD      . alum & mag hydroxide-simeth (MAALOX/MYLANTA) 200-200-20 MG/5ML suspension 30 mL  30 mL Oral Q4H PRN Clapacs, John T, MD      . docusate sodium (COLACE) capsule 100 mg  100 mg Oral BID Clapacs, [name], MD   100 mg at 01/07/21 0825  . magnesium hydroxide (MILK OF MAGNESIA) suspension 30 mL  30 mL Oral Daily PRN Clapacs, John T, MD      . OLANZapine (ZYPREXA) tablet 10 mg  10 mg Oral BID Clapacs, [name], MD   10 mg at 01/07/21 0825  . pantoprazole (PROTONIX) EC tablet 40 mg  40 mg Oral Daily PRN Clapacs, John T, MD      . traZODone (DESYREL) tablet 100 mg  100 mg Oral QHS PRN Caroline Sauger, NP   100 mg at 01/07/21 0108   PTA Medications: Medications Prior to Admission  Medication Sig Dispense Refill Last Dose  . acetaminophen (TYLENOL) 500 MG tablet Take 500 mg by mouth every 6 (six) hours as needed for moderate pain.     Marland Kitchen amLODipine (NORVASC) 5 MG tablet Take 5 mg by mouth daily. (Patient not taking: No sig reported)     . aspirin-sod bicarb-citric acid (ALKA-SELTZER) 325 MG TBEF tablet Take 325 mg by mouth every 6 (six) hours as needed (indigestion).     . celecoxib (CELEBREX) 100 MG capsule Take 100 mg by mouth 2 (two) times daily as needed.     . citalopram (CELEXA) 20 MG tablet TAKE 1 TABLET BY MOUTH EVERY DAY 30 tablet 3   . hydrochlorothiazide (HYDRODIURIL) 25 MG tablet Take 1 tablet by mouth daily.     . pantoprazole (PROTONIX) 40 MG tablet Take 40 mg by mouth daily as  needed (heartburn).      Marland Kitchen PROVENTIL HFA 108 (90 Base) MCG/ACT inhaler Inhale 2 puffs into the lungs every 4 (four) hours as needed.     . tamoxifen (NOLVADEX) 20 MG tablet TAKE 1 TABLET BY MOUTH EVERY DAY 90 tablet 3     Patient Stressors: Health problems Marital or family conflict Substance abuse  Patient Strengths: Average or above average intelligence Communication skills Motivation for treatment/growth Supportive family/friends  Treatment Modalities: Medication Management, Group therapy, Case management,  1 to 1 session with clinician, Psychoeducation, Recreational therapy.   Physician Treatment Plan for Primary Diagnosis: <principal problem not specified> Long Term Goal(s):     Short Term Goals:    Medication Management: Evaluate patient's response, side effects, and tolerance of medication regimen.  Therapeutic Interventions: 1 to 1 sessions, Unit Group sessions and Medication administration.  Evaluation of Outcomes: Not Met  Physician Treatment Plan for Secondary Diagnosis: Active Problems:   Bipolar affective disorder, current episode manic ([place])   Psychosis ([place])  Long Term Goal(s):     Short Term Goals:       Medication Management: Evaluate patient's response, side effects, and tolerance of medication regimen.  Therapeutic Interventions: 1 to 1 sessions, Unit Group sessions and Medication administration.  Evaluation of Outcomes: Not Met  RN Treatment Plan for Primary Diagnosis: <principal problem not specified> Long Term Goal(s): Knowledge of disease and therapeutic regimen to maintain health will improve  Short Term Goals: Ability to remain free from injury will improve, Ability to verbalize frustration and anger appropriately will improve, Ability to demonstrate self-control, Ability to participate in decision making will improve, Ability to verbalize feelings will improve, Ability to disclose and discuss suicidal ideas, Ability to identify and develop  effective coping behaviors will improve and Compliance with prescribed medications will improve  Medication Management: RN will administer medications as ordered by provider, will assess and evaluate patient's response and provide education to patient for prescribed medication. RN will report any adverse and/or side effects to prescribing provider.  Therapeutic Interventions: 1 on 1 counseling sessions, Psychoeducation, Medication administration, Evaluate responses to treatment, Monitor vital signs and CBGs as ordered, Perform/monitor CIWA, COWS, AIMS and Fall Risk screenings as ordered, Perform wound care treatments as ordered.  Evaluation of Outcomes: Not Met   LCSW Treatment Plan for Primary Diagnosis: <principal problem not specified> Long Term Goal(s): Safe transition to appropriate next level of care at discharge, Engage patient in therapeutic group addressing interpersonal concerns.  Short Term Goals: Engage patient in aftercare planning with referrals and resources, Increase social support, Increase ability to appropriately verbalize feelings, Increase emotional regulation, Facilitate acceptance of mental health diagnosis and concerns, Facilitate patient progression through stages of change regarding substance use diagnoses and concerns, Identify triggers associated with mental health/substance abuse issues and Increase skills for wellness and recovery  Therapeutic Interventions: Assess for all discharge needs, 1 to 1 time with Social worker, Explore available resources and support systems, Assess for adequacy in community support network, Educate family and significant other(s) on suicide prevention, Complete Psychosocial Assessment, Interpersonal group therapy.  Evaluation of Outcomes: Not Met   Progress in Treatment: Attending groups: No. Participating in groups: No. Taking medication as prescribed: Yes. Toleration medication: Yes. Family/Significant other contact made: No, will  contact:  when csw obtains permission Patient understands diagnosis: No. Discussing patient identified problems/goals with staff: Yes. Medical problems stabilized or resolved: Yes. Denies suicidal/homicidal ideation: Yes. Issues/concerns per patient self-inventory: No. Other: None  New problem(s) identified: No, Describe:  None  New Short Term/Long Term Goal(s): detox, elimination of symptoms of psychosis, medication management for mood stabilization; elimination of SI thoughts; development of comprehensive mental wellness/sobriety plan.  Patient Goals:  "I like to plant flowers, I like to be outside" "smoking cigarettes" "go home and get my clothes"  Discharge Plan or Barriers: CSW will assist pt in appropriate discharge and follow up care plan.  Reason for Continuation of Hospitalization: Aggression Mania Medical Issues Medication stabilization  Estimated Length of Stay: TBD  Recreational Therapy: Patient Stressors: N/A Patient Goal: Patient will engage in groups without prompting or encouragement from LRT x3 group sessions within 5 recreation therapy group sessions.  Attendees: Patient: Penny Chase 01/07/2021 9:48 AM  Physician: [name], MD 01/07/2021 9:48 AM  Nursing: [place] Pugh, RN  01/07/2021 9:48 AM  RN Care Manager: 01/07/2021 9:48 AM  Social Worker: Assunta Curtis, MSW, LCSW 01/07/2021 9:48 AM  Recreational Therapist: Devin Going, LRT  01/07/2021 9:48 AM  Other: Michell Heinrich, MSW, LCSW, LCAS 01/07/2021 9:48 AM  Other: Kiva [place], MSW, LCSW-A  01/07/2021 9:48 AM  Other: 01/07/2021 9:48 AM    Scribe for Treatment Team: Kiva A [place], [place] 01/07/2021 9:48 AM

## 2021-01-07 NOTE — H&P (Addendum)
Psychiatric Admission Assessment Adult  Patient Identification: Penny Chase MRN:  [id number] Date of Evaluation:  01/07/2021 Chief Complaint:  Bipolar affective disorder, current episode manic ([place]) [F31.10] Psychosis ([place]) [F29] Principal Diagnosis: Bipolar affective disorder, current episode manic ([place]) Diagnosis:  Principal Problem:   Bipolar affective disorder, current episode manic ([place]) Active Problems:   Psychosis ([place])   Gastroesophageal reflux disease without esophagitis  CC " want to go outside, get the landscaping"  History of Present Illness: 50 year old female without psychiatric history presenting after manic-like episode.  Patient seen during treatment team and again one-on-one this morning.  She is hyperactive, euphoric, intrusive, and exhibits pressured speech and tangential thought process.  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  She also notes that she wants to go outside to smoke a cigarette and also smoked some marijuana.  She has had poor sleep for approximately 6 months she will sometimes sleep for an hour.  She notes it feels like a power nap and she has tons of energy throughout the day.  She has had poor appetite, weight loss, and labile mood.  She also has some medical complaints including easy bruising, tingling in her legs, hot flashes, and night sweats.  She does admit to past history of alcohol abuse crack cocaine abuse opioid abuse nicotine dependence and cannabis use.  She does say is that she has had a history of withdrawals, but has never been hospitalized for them.  Per commitment papers filed by her husband and he reports that she is having mood swings, combativeness, and alcohol and pain medication abuse. Associated Signs/Symptoms: Depression Symptoms:  weight loss, decreased appetite, Duration of Depression Symptoms: Greater than two weeks  (Hypo) Manic Symptoms:  Distractibility, Flight of Ideas, Impulsivity, Labiality of  Mood, Anxiety Symptoms:  Excessive Worry, Psychotic Symptoms:  Paranoia, PTSD Symptoms: Negative Total Time spent with patient: 1 hour  Past Psychiatric History: Patient was briefly on citalopram after treatment for her breast cancer.  She has no formal diagnosis of bipolar disorder or mood disorder in general.  No previous hospitalizations, no history of suicide attempts.  She does report a history of polysubstance abuse, but no formal treatment.  Is the patient at risk to self? Yes.    Has the patient been a risk to self in the past 6 months? No.  Has the patient been a risk to self within the distant past? No.  Is the patient a risk to others? Yes.    Has the patient been a risk to others in the past 6 months? No.  Has the patient been a risk to others within the distant past? No.   Prior Inpatient Therapy:   Prior Outpatient Therapy:    Alcohol Screening: 1. How often do you have a drink containing alcohol?: 2 to 3 times a week 2. How many drinks containing alcohol do you have on a typical day when you are drinking?: 7, 8, or 9 3. How often do you have six or more drinks on one occasion?: Weekly AUDIT-C Score: 9 4. How often during the last year have you found that you were not able to stop drinking once you had started?: Never 5. How often during the last year have you failed to do what was normally expected from you because of drinking?: Never 6. How often during the last year have you needed a first drink in the morning to get yourself going after a heavy drinking session?: Never 7. How  often during the last year have you had a feeling of guilt of remorse after drinking?: Never 8. How often during the last year have you been unable to remember what happened the night before because you had been drinking?: Never 9. Have you or someone else been injured as a result of your drinking?: No 10. Has a relative or friend or a doctor or another health worker been concerned about your  drinking or suggested you cut down?: No Alcohol Use Disorder Identification Test Final Score (AUDIT): 9 Alcohol Brief Interventions/Follow-up: Patient Refused Substance Abuse History in the last 12 months:  Yes.   Consequences of Substance Abuse: Worsening mental health Previous Psychotropic Medications: Yes  Psychological Evaluations: No  Past Medical History:  Past Medical History:  Diagnosis Date  . Breast cancer ([place])   . Cancer ([place]) 09/2018   Right breast  . COVID-19 11/2018  . Family history of breast cancer   . Hypertension   . Personal history of radiation therapy 2020   Rt breast    Past Surgical History:  Procedure Laterality Date  . BREAST BIOPSY Right 09/12/2018   [place] core/"venus clip"-INVASIVE MAMMARYLobular CARCINOMA, WITH LOBULAR FEATURES  . BREAST LUMPECTOMY Right 10/04/2018  . BREAST LUMPECTOMY WITH NEEDLE LOCALIZATION AND AXILLARY SENTINEL LYMPH NODE BX Right 10/04/2018   Procedure: BREAST LUMPECTOMY WITH NEEDLE LOCALIZATION AND AXILLARY SENTINEL LYMPH NODE BX;  Surgeon: Herbert Pun, MD;  Location: [facility] ORS;  Service: General;  Laterality: Right;  . TONSILLECTOMY     Family History:  Family History  Problem Relation Age of Onset  . Breast cancer Paternal Aunt    Family Psychiatric  History: Denies Tobacco Screening: Have you used any form of tobacco in the last 30 days? (Cigarettes, Smokeless Tobacco, Cigars, and/or Pipes): Yes Tobacco use, Select all that apply: 5 or more cigarettes per day Are you interested in Tobacco Cessation Medications?: No, patient refused Counseled patient on smoking cessation including recognizing danger situations, developing coping skills and basic information about quitting provided: Refused/Declined practical counseling Social History:  Social History   Substance and Sexual Activity  Alcohol Use Not Currently   Comment: not taken for about 6 weeks     Social History   Substance and Sexual Activity  Drug Use Yes   . Types: Marijuana   Comment: Uses street opioids    Additional Social History:                           Allergies:   Allergies  Allergen Reactions  . Amlodipine Swelling   Lab Results:  Results for orders placed or performed during the hospital encounter of 01/06/21 (from the past 48 hour(s))  Lipid panel     Status: Abnormal   Collection Time: 01/07/21  6:36 AM  Result Value Ref Range   Cholesterol 224 (H) 0 - 200 mg/dL   Triglycerides 85 <150 mg/dL   HDL 56 >40 mg/dL   Total CHOL/HDL Ratio 4.0 RATIO   VLDL 17 0 - 40 mg/dL   LDL Cholesterol 151 (H) 0 - 99 mg/dL    Comment:        Total Cholesterol/HDL:CHD Risk Coronary Heart Disease Risk Table                     Men   Women  1/2 Average Risk   3.4   3.3  Average Risk       5.0   4.4  2 X Average Risk   9.6   7.1  3 X Average Risk  23.4   11.0        Use the calculated Patient Ratio above and the CHD Risk Table to determine the patient's CHD Risk.        ATP III CLASSIFICATION (LDL):  <100     mg/dL   Optimal  100-129  mg/dL   Near or Above                    Optimal  130-159  mg/dL   Borderline  160-189  mg/dL   High  >190     mg/dL   Very High Performed at [facility], [place], [place] [address]   TSH     Status: None   Collection Time: 01/07/21  6:36 AM  Result Value Ref Range   TSH 2.667 0.350 - 4.500 uIU/mL    Comment: Performed by a 3rd Generation assay with a functional sensitivity of <=0.01 uIU/mL. Performed at [facility], [address] [place], [place] [address]     Blood Alcohol level:  Lab Results  Component Value Date   [facility] <10 93/26/7124    Metabolic Disorder Labs:  No results found for: HGBA1C, MPG No results found for: PROLACTIN Lab Results  Component Value Date   CHOL 224 (H) 01/07/2021   TRIG 85 01/07/2021   HDL 56 01/07/2021   CHOLHDL 4.0 01/07/2021   VLDL 17 01/07/2021   LDLCALC 151 (H) 01/07/2021    Current  Medications: Current Facility-Administered Medications  Medication Dose Route Frequency Provider Last Rate Last Admin  . acetaminophen (TYLENOL) tablet 650 mg  650 mg Oral Q6H PRN Clapacs, John T, MD      . alum & mag hydroxide-simeth (MAALOX/MYLANTA) 200-200-20 MG/5ML suspension 30 mL  30 mL Oral Q4H PRN Clapacs, John T, MD      . docusate sodium (COLACE) capsule 100 mg  100 mg Oral BID Clapacs, [name], MD   100 mg at 01/07/21 0825  . feeding supplement (ENSURE ENLIVE / ENSURE PLUS) liquid 237 mL  237 mL Oral TID BM [name], MD      . magnesium hydroxide (MILK OF MAGNESIA) suspension 30 mL  30 mL Oral Daily PRN Clapacs, John T, MD      . OLANZapine zydis (ZYPREXA) disintegrating tablet 10 mg  10 mg Oral Q12H [name], MD      . pantoprazole (PROTONIX) EC tablet 40 mg  40 mg Oral Daily PRN Clapacs, John T, MD      . temazepam (RESTORIL) capsule 15 mg  15 mg Oral QHS PRN [name], MD       PTA Medications: Medications Prior to Admission  Medication Sig Dispense Refill Last Dose  . acetaminophen (TYLENOL) 500 MG tablet Take 500 mg by mouth every 6 (six) hours as needed for moderate pain.     Marland Kitchen amLODipine (NORVASC) 5 MG tablet Take 5 mg by mouth daily. (Patient not taking: No sig reported)     . aspirin-sod bicarb-citric acid (ALKA-SELTZER) 325 MG TBEF tablet Take 325 mg by mouth every 6 (six) hours as needed (indigestion).     . celecoxib (CELEBREX) 100 MG capsule Take 100 mg by mouth 2 (two) times daily as needed.     . citalopram (CELEXA) 20 MG tablet TAKE 1 TABLET BY MOUTH EVERY DAY 30 tablet 3   . hydrochlorothiazide (HYDRODIURIL) 25  MG tablet Take 1 tablet by mouth daily.     . pantoprazole (PROTONIX) 40 MG tablet Take 40 mg by mouth daily as needed (heartburn).      Marland Kitchen PROVENTIL HFA 108 (90 Base) MCG/ACT inhaler Inhale 2 puffs into the lungs every 4 (four) hours as needed.     . tamoxifen (NOLVADEX) 20 MG tablet TAKE 1 TABLET BY MOUTH EVERY DAY 90 tablet 3      Musculoskeletal: Strength & Muscle Tone: within normal limits Gait & Station: normal Patient leans: N/A            Psychiatric Specialty Exam:  Presentation  General Appearance: Fairly Groomed  Eye Contact:Good  Speech:Pressured  Speech Volume:Increased  Handedness:Right   Mood and Affect  Mood:Euphoric  Affect:Congruent   Thought Process  Thought Processes:Disorganized  Duration of Psychotic Symptoms: No data recorded Past Diagnosis of Schizophrenia or Psychoactive disorder: No  Descriptions of Associations:Loose  Orientation:Full (Time, Place and Person)  Thought Content:Tangential  Hallucinations:Hallucinations: None  Ideas of Reference:None  Suicidal Thoughts:Suicidal Thoughts: No  Homicidal Thoughts:Homicidal Thoughts: No   Sensorium  Memory:Immediate Fair; Recent Fair; Remote [place]   Executive Functions  Concentration:Poor  Attention Span:Poor  [place]   Psychomotor Activity  Psychomotor Activity:Psychomotor Activity: Restlessness   Assets  Assets:Desire for Improvement; Housing; Intimacy; Resilience; Talents/Skills   Sleep  Sleep:Sleep: Poor Number of Hours of Sleep: 0.45    Physical Exam: Physical Exam Vitals and nursing note reviewed.  Constitutional:      Appearance: Normal appearance.  HENT:     Head: Normocephalic and atraumatic.     Right Ear: External ear normal.     Left Ear: External ear normal.     Nose: Nose normal.     Mouth/Throat:     Mouth: Mucous membranes are moist.     Pharynx: Oropharynx is clear.  Eyes:     Extraocular Movements: Extraocular movements intact.     Conjunctiva/sclera: Conjunctivae normal.     Pupils: Pupils are equal, round, and reactive to light.  Cardiovascular:     Rate and Rhythm: Normal rate.     Pulses: Normal pulses.  Pulmonary:     Effort: Pulmonary effort is normal.      Breath sounds: Normal breath sounds.  Abdominal:     General: Abdomen is flat.     Palpations: Abdomen is soft.  Musculoskeletal:        General: No swelling. Normal range of motion.     Cervical back: Normal range of motion and neck supple.  Skin:    General: Skin is warm and dry.  Neurological:     General: No focal deficit present.     Mental Status: She is alert and oriented to person, place, and time.  Psychiatric:        Attention and Perception: She is inattentive.        Mood and Affect: Mood is elated. Affect is labile.        Speech: Speech is rapid and pressured.        Behavior: Behavior is hyperactive.        Thought Content: Thought content is paranoid.        Judgment: Judgment is impulsive.    Review of Systems  Constitutional: Positive for diaphoresis. Negative for fever.  HENT: Negative.   Eyes: Negative.   Respiratory: Negative.   Cardiovascular: Negative.   Gastrointestinal: Negative.   Genitourinary: Negative.  Musculoskeletal: Positive for joint pain. Negative for falls.  Skin: Negative.   Neurological: Positive for tingling. Negative for focal weakness.  Endo/Heme/Allergies: Positive for environmental allergies. Bruises/bleeds easily.  Psychiatric/Behavioral: Positive for substance abuse. The patient is nervous/anxious and has insomnia.    Blood pressure (!) 124/91, pulse 88, temperature 97.8 F (36.6 C), temperature source Oral, resp. rate 18, height 5' 4.5" (1.638 m), weight 68.5 kg, last menstrual period 09/20/2018, SpO2 97 %. Body mass index is 25.52 kg/m.  Treatment Plan Summary: Daily contact with patient to assess and evaluate symptoms and progress in treatment and Medication management 50 year old female presenting in manic-like episode.  She is sleeping less than 1 hour per night, but has extreme amounts of energy.  She has flight of ideas, tangential thoughts, and poor boundaries.  While in the emergency room she exhibited aggressiveness  towards staff, but has not been aggressive here on her unit thus far.  She does remain intrusive, and has poor boundaries.  Continue Zyprexa Zydis 10 mg twice a day for acute mania.  Will order Restoril 15 mg as needed for insomnia.  Continue Protonix 40 mg daily for heartburn.  Lab work completed while in the emergency room.  TSH within normal limits at 2.667.  CT head negative for intracranial mass or lesion.  Observation Level/Precautions:  15 minute checks  Laboratory:  Completed in ED  Psychotherapy:    Medications:    Consultations:    Discharge Concerns:    Estimated LOS:  Other:     Physician Treatment Plan for Primary Diagnosis: Bipolar affective disorder, current episode manic ([place]) Long Term Goal(s): Improvement in symptoms so as ready for discharge  Short Term Goals: Ability to identify changes in lifestyle to reduce recurrence of condition will improve, Ability to verbalize feelings will improve, Ability to disclose and discuss suicidal ideas, Ability to demonstrate self-control will improve, Ability to identify and develop effective coping behaviors will improve, Ability to maintain clinical measurements within normal limits will improve, Compliance with prescribed medications will improve and Ability to identify triggers associated with substance abuse/mental health issues will improve  Physician Treatment Plan for Secondary Diagnosis: Principal Problem:   Bipolar affective disorder, current episode manic ([place]) Active Problems:   Psychosis ([place])   Gastroesophageal reflux disease without esophagitis  Long Term Goal(s): Improvement in symptoms so as ready for discharge  Short Term Goals: Ability to identify changes in lifestyle to reduce recurrence of condition will improve, Ability to verbalize feelings will improve, Ability to disclose and discuss suicidal ideas, Ability to demonstrate self-control will improve, Ability to identify and develop effective coping behaviors will  improve, Ability to maintain clinical measurements within normal limits will improve, Compliance with prescribed medications will improve and Ability to identify triggers associated with substance abuse/mental health issues will improve  I certify that inpatient services furnished can reasonably be expected to improve the patient's condition.    [name], MD 5/25/202211:49 AM

## 2021-01-07 NOTE — [facility] Suicide Risk Assessment (Signed)
Penny Chase   Nursing information obtained from:  Patient Demographic factors:  Caucasian Current Mental Status:  NA Loss Factors:  Decline in physical health Historical Factors:  NA Risk Reduction Factors:  Living with another person, especially a relative,Positive social support  Total Time spent with patient: 1 hour Principal Problem: Bipolar affective disorder, current episode manic ([facility]) Diagnosis:  Principal Problem:   Bipolar affective disorder, current episode manic ([place]) Active Problems:   Psychosis ([place])   Gastroesophageal reflux disease without esophagitis  Subjective Data: 50 year old female without psychiatric history presenting after manic-like episode.  Patient seen during treatment team and again one-on-one this morning.  She is hyperactive, euphoric, intrusive, and exhibits pressured speech and tangential thought process.  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  She also notes that she wants to go outside to smoke a cigarette and also smoked some marijuana.  She has had poor sleep for approximately 6 months she will sometimes sleep for an hour.  She notes it feels like a power nap and she has tons of energy throughout the day.  She has had poor appetite, weight loss, and labile mood.  She also has some medical complaints including easy bruising, tingling in her legs, hot flashes, and night sweats.  She does admit to past history of alcohol abuse crack cocaine abuse opioid abuse nicotine dependence and cannabis use.  She does say is that she has had a history of withdrawals, but has never been hospitalized for them.  Continued Clinical Symptoms:  Alcohol Use Disorder Identification Test Final Score (AUDIT): 9 The "Alcohol Use Disorders Identification Test", Guidelines for Use in Primary Care, Second Edition.  World Pharmacologist [facility]). Score between 0-7:  no or low risk or alcohol related problems. Score between 8-15:   moderate risk of alcohol related problems. Score between 16-19:  high risk of alcohol related problems. Score 20 or above:  warrants further diagnostic evaluation for alcohol dependence and treatment.   CLINICAL FACTORS:   Bipolar Disorder:   Mixed State Alcohol/Substance Abuse/Dependencies Previous Psychiatric Diagnoses and Treatments Medical Diagnoses and Treatments/Surgeries   Musculoskeletal: Strength & Muscle Tone: within normal limits Gait & Station: normal Patient leans: N/A  Psychiatric Specialty Exam:  Presentation  General Appearance: Fairly Groomed  Eye Contact:Good  Speech:Pressured  Speech Volume:Increased  Handedness:Right   Mood and Affect  Mood:Euphoric  Affect:Congruent   Thought Process  Thought Processes:Disorganized  Descriptions of Associations:Loose  Orientation:Full (Time, Place and Person)  Thought Content:Tangential  History of Schizophrenia/Schizoaffective disorder:No  Duration of Psychotic Symptoms:No data recorded Hallucinations:Hallucinations: None  Ideas of Reference:None  Suicidal Thoughts:Suicidal Thoughts: No  Homicidal Thoughts:Homicidal Thoughts: No   Sensorium  Memory:Immediate Fair; Recent Fair; Remote [place]   Executive Functions  Concentration:Poor  Attention Span:Poor  [place]   Psychomotor Activity  Psychomotor Activity:Psychomotor Activity: Restlessness   Assets  Assets:Desire for Improvement; Housing; Intimacy; Resilience; Talents/Skills   Sleep  Sleep:Sleep: Poor Number of Hours of Sleep: 0.45    Physical Exam: Physical Exam ROS Blood pressure (!) 124/91, pulse 88, temperature 97.8 F (36.6 C), temperature source Oral, resp. rate 18, height 5' 4.5" (1.638 m), weight 68.5 kg, last menstrual period 09/20/2018, SpO2 97 %. Body mass index is 25.52 kg/m.   COGNITIVE FEATURES THAT CONTRIBUTE TO RISK:   Loss of executive function    SUICIDE RISK:   Mild:  Suicidal ideation of limited frequency, intensity, duration,  and specificity.  There are no identifiable plans, no associated intent, mild dysphoria and related symptoms, good self-control (both objective and subjective Chase), few other risk factors, and identifiable protective factors, including available and accessible social support.  PLAN OF CARE: Continue inpatient admission, see H&P for details.   I certify that inpatient services furnished can reasonably be expected to improve the patient's condition.   Penny Scarlet, MD 01/07/2021, 12:03 PM

## 2021-01-07 NOTE — Progress Notes (Signed)
Recreation Therapy Notes  INPATIENT RECREATION TR PLAN  Patient Details Name: Penny Chase MRN: [id number] DOB: 1971/01/21 Today's Date: 01/07/2021  Rec Therapy Plan Is patient appropriate for Therapeutic Recreation?: Yes Treatment times per week: at least 3 Estimated Length of Stay: 5-7 days TR Treatment/Interventions: Group participation (Comment)  Discharge Criteria Pt will be discharged from therapy if:: Discharged Treatment plan/goals/alternatives discussed and agreed upon by:: Patient/family  Discharge Summary     Evaan Tidwell 01/07/2021, 4:16 PM

## 2021-01-07 NOTE — Progress Notes (Signed)
Recreation Therapy Notes  Date: 01/07/2021  Time: 10:00 am   Location: Craft room   Behavioral response: Appropriate, Hyperverbal, Hyperactive   Intervention Topic: Self-care   Discussion/Intervention:  Group content today was focused on Self-Care. The group defined self-care and some positive ways they care for themselves. Individuals expressed ways and reasons why they neglected any self-care in the past. Patients described ways to improve self-care in the future. The group explained what could happen if they did not do any self-care activities at all. The group participated in the intervention "self-care assessment" where they had a chance to discover some of their weaknesses and strengths in self- care. Patient came up with a self-care plan to improve themselves in the future.  Clinical Observations/Feedback: Patient came to group and needed redirection to focus on the topic at hand and to leave peers alone. Individual was social with peers and staff while participating in intervention.  Jenin Birdsall LRT/CTRS          Joany Khatib 01/07/2021 11:57 AM

## 2021-01-08 DIAGNOSIS — F3113 Bipolar disorder, current episode manic without psychotic features, severe: Secondary | ICD-10-CM | POA: Diagnosis not present

## 2021-01-08 MED ORDER — LORAZEPAM 2 MG/ML IJ SOLN: 1.0000 mg | Freq: Once | INTRAMUSCULAR | Status: DC

## 2021-01-08 MED ORDER — LORAZEPAM 1 MG PO TABS: 1.0000 mg | ORAL_TABLET | Freq: Once | ORAL | Status: DC

## 2021-01-08 MED ORDER — DIVALPROEX SODIUM ER 500 MG PO TB24
500.0000 mg | ORAL_TABLET | Freq: Every day | ORAL | Status: DC
  Administered 2021-01-08: 500 mg via ORAL
  Filled 2021-01-08: qty 1

## 2021-01-08 NOTE — Progress Notes (Signed)
Patient alert and oriented x 3 with periods of confusion to situation, she appears restless and tired, very intrusive, her thoughts are disorganized and incoherent, speech is tangential she appears responding to internal stimuli. 15 minutes safety checks maintained will continue to monitor.
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
## 2021-01-08 NOTE — BHH Group Notes (Signed)
[facility] Group Notes:  (Nursing/MHT/Case Management/Adjunct)  Date:  01/08/2021  Time:  1:08 AM  Type of Therapy:  Group Therapy  Participation Level:  Did Not Attend  Summary of Progress/Problems:  Penny Chase 01/08/2021, 1:08 AM

## 2021-01-08 NOTE — Progress Notes (Signed)
Pt is alert and oriented to person, place, time and situation. Pt has symptoms of mania, hyperverbal, rapid, loud, pressured speech, restless, intrusive, requires frequent redirections, reports racing thoughts, describes herself as "the fasted mouth of the [place]." Pt reports not sleeping well, reports maybe 2 hours of sleep last night. Pt denies suicidal and homicidal ideation, denies hallucinations, denies feelings of depression and anxiety. Pt has been spending a lot of time talking on the telephone. Pt is medication complaint, appetite is good, is social with select peers. PT spends some time grooming self today. Will continue to monitor pt per Q15 minute face checks and monitor for safety and progress.

## 2021-01-08 NOTE — Progress Notes (Signed)
Recreation Therapy Notes  Date: 01/08/2021  Time: 9:30 am  Location: Craft room   Behavioral response:Hyperactive, Hyperverbal   Intervention Topic: Animal Assisted Therapy   Discussion/Intervention:  Animal Assisted Therapy took place today during group.  Animal Assisted Therapy is the planned inclusion of an animal in a patient's treatment plan. The patients were able to engage in therapy with an animal during group. Participants were educated on what a service dog is and the different between a support dog and a service dog. Patient were informed on the many animal needs there are and how their needs are similar. Individuals were enlightened on the process to get a service animal or support animal. Patients got the opportunity to pet the animal and were offered emotional support from the animal and staff.  Clinical Observations/Feedback:  Patient came to group and was on topic and was focused on what peers and staff had to say. Participant shared their experiences and history with animals. Individual was social with peers, staff and animal while participating in group. Participant needed redirection to focus on herself and the group topic.  Everard Interrante LRT/CTRS         Taj Arteaga 01/08/2021 12:14 PM

## 2021-01-08 NOTE — BHH Group Notes (Signed)
LCSW Group Therapy Note  01/08/2021 2:10 PM  Type of Therapy/Topic:  Group Therapy:  Balance in Life  Participation Level:  None  Description of Group:    This group will address the concept of balance and how it feels and looks when one is unbalanced. Patients will be encouraged to process areas in their lives that are out of balance and identify reasons for remaining unbalanced. Facilitators will guide patients in utilizing problem-solving interventions to address and correct the stressor making their life unbalanced. Understanding and applying boundaries will be explored and addressed for obtaining and maintaining a balanced life. Patients will be encouraged to explore ways to assertively make their unbalanced needs known to significant others in their lives, using other group members and facilitator for support and feedback.  Therapeutic Goals: 1. Patient will identify two or more emotions or situations they have that consume much of in their lives. 2. Patient will identify signs/triggers that life has become out of balance:  3. Patient will identify two ways to set boundaries in order to achieve balance in their lives:  4. Patient will demonstrate ability to communicate their needs through discussion and/or role plays  Summary of Patient Progress: Patient complained most of the group about not being able to breath in the mask and that she would leave if she did not like the discussion. Pt present maybe five minutes during the discussion. Her comments were disorganized and tangential. Pt came back to group right as it was closing and stated that it was such a great group.   Therapeutic Modalities:   Cognitive Behavioral Therapy Solution-Focused Therapy Assertiveness Training  [employer]. Guerry Bruin, MSW, [place], [place] 01/08/2021 2:10 PM

## 2021-01-09 DIAGNOSIS — F3113 Bipolar disorder, current episode manic without psychotic features, severe: Secondary | ICD-10-CM | POA: Diagnosis not present

## 2021-01-09 MED ORDER — ZIPRASIDONE MESYLATE 20 MG IM SOLR
20.0000 mg | Freq: Three times a day (TID) | INTRAMUSCULAR | Status: DC | PRN
  Administered 2021-01-09 – 2021-01-12 (×2): 20 mg via INTRAMUSCULAR
  Filled 2021-01-09 (×3): qty 20

## 2021-01-09 MED ORDER — DIVALPROEX SODIUM ER 500 MG PO TB24
1000.0000 mg | ORAL_TABLET | Freq: Every day | ORAL | Status: DC
  Administered 2021-01-09 – 2021-01-14 (×6): 1000 mg via ORAL
  Filled 2021-01-09 (×6): qty 2

## 2021-01-09 MED ORDER — OLANZAPINE 10 MG PO TABS
10.0000 mg | ORAL_TABLET | Freq: Four times a day (QID) | ORAL | Status: DC | PRN
  Administered 2021-01-10 – 2021-01-14 (×3): 10 mg via ORAL
  Filled 2021-01-09 (×4): qty 1
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
## 2021-01-09 NOTE — Progress Notes (Signed)
Pt has been alert and oriented to person, place, time, but not to situation. Pt has been manic, pacing hyperactive, intrusive, agitated, using profanity, in the afternoon became aggressive and banging on the windows in the nurses station yelling "fuck you" throwing her hands towards security in a threatening and hostile manner, throwing hands in the air towards nursing staff, and attempted to break into the nurses station door. MD/Freeman was in the nurses station when this was going on, and witnessed pt's aggressive behavior, pt was not responding to verbal de-escalation attempts, and pt was given IM Geodon, was complaint with the IM med administration, which was effective, and pt went to sleep shortly afterwards for a few hours, woke back up and paced the hallways. Pt's thoughts are disorganized at times. Will continue to monitor pt per Q15 minutes for safety and progress.

## 2021-01-09 NOTE — BHH Counselor (Signed)
CSW received a call from pt's brother. Penny Chase, [phone],  wanting an update on the patient.  CSW informed that patient remains manic at this time and continues to struggle with adequate sleep.  Brother asked for information on how pt and her husband would be able to afford the hospital stay.  CSW informed that CSW is not entirely familiar with the financial aspect of care, however, to her understanding patient would receive a bill and would be able to make payment arrangements if necessary.  Assunta Curtis, MSW, LCSW 01/09/2021 11:27 AM
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
## 2021-01-09 NOTE — Plan of Care (Signed)
  Problem: Health Behavior/Discharge Planning: Goal: Compliance with treatment plan for underlying cause of condition will improve Outcome: Progressing   Problem: Coping: Goal: Ability to use eye contact when communicating with others will improve Outcome: Progressing   Problem: Self-Concept: Goal: Will verbalize positive feelings about self Outcome: Progressing

## 2021-01-09 NOTE — Progress Notes (Signed)
Recreation Therapy Notes   Date: 01/09/2021  Time: 10:00 am   Location: Craft room   Behavioral response: Hyperverbal, Hyperactive  Intervention Topic: Creative expressions   Discussion/Intervention:  Group content on today was focused on creative expressions. The group defined creative expressions and ways they use creative expressions. Individual identified other positive ways creative expressions can be used and why it is important to express yourself. Patients participated in the intervention "expressive folding", where they had a chance to creatively express themselves. Clinical Observations/Feedback: Patient came to group and defined creative expressions as the way I express myself to others. She stated that creative expression are important to get to know people and show them who you are.  Individual was social with staff and peers while participating in the intervention. Shreshta Medley LRT/CTRS           Kathalene Sporer 01/09/2021 12:39 PM

## 2021-01-09 NOTE — BHH Group Notes (Signed)
LCSW Group Therapy Note  01/09/2021 11:03 AM  Type of Therapy and Topic:  Group Therapy:  Feelings around Relapse and Recovery  Participation Level:  None   Description of Group:    Patients in this group will discuss emotions they experience before and after a relapse. They will process how experiencing these feelings, or avoidance of experiencing them, relates to having a relapse. Facilitator will guide patients to explore emotions they have related to recovery. Patients will be encouraged to process which emotions are more powerful. They will be guided to discuss the emotional reaction significant others in their lives may have to their relapse or recovery. Patients will be assisted in exploring ways to respond to the emotions of others without this contributing to a relapse.  Therapeutic Goals: 1. Patient will identify two or more emotions that lead to a relapse for them 2. Patient will identify two emotions that result when they relapse 3. Patient will identify two emotions related to recovery 4. Patient will demonstrate ability to communicate their needs through discussion and/or role plays   Summary of Patient Progress: Patient attended group, however, was unable to deescalate and was asked to leave group.    Therapeutic Modalities:   Cognitive Behavioral Therapy Solution-Focused Therapy Assertiveness Training Relapse Prevention Therapy   Assunta Curtis, MSW, LCSW 01/09/2021 11:03 AM

## 2021-01-09 NOTE — Progress Notes (Signed)
Patient has been intrusive for most of the shift. Comes to the nurses station multiple times with hand written notes, various questions and requests.  Reports not being able to sleep, and just now had complaint of headache which she rates at as 7/10. Tylenol given to help with symptoms. She denies SI/HI/AVH/ depression and anxiety. Presents manic with pressured speech and restlessness.  Recieved all of her QHS meds earlier at med pass and tolerated without incident. Continues not to be able to sleep.  Will continue to  Monitor with Q 15 minute safety checks.     Cleo Butler-Nicholson, LPN

## 2021-01-10 DIAGNOSIS — F3113 Bipolar disorder, current episode manic without psychotic features, severe: Secondary | ICD-10-CM

## 2021-01-10 MED ORDER — IBUPROFEN 600 MG PO TABS
600.0000 mg | ORAL_TABLET | Freq: Four times a day (QID) | ORAL | Status: DC | PRN
  Administered 2021-01-10 – 2021-01-15 (×7): 600 mg via ORAL
  Filled 2021-01-10 (×7): qty 1

## 2021-01-10 NOTE — Progress Notes (Signed)
[facility] MD Progress Note  01/10/2021 11:23 AM Penny Chase  MRN:  [id number]  Principal Problem: Bipolar affective disorder, current episode manic ([place]) Diagnosis: Principal Problem:   Bipolar affective disorder, current episode manic ([place]) Active Problems:   Psychosis ([place])   Gastroesophageal reflux disease without esophagitis  Penny Chase is a 50 y.o. female without psychiatric history presenting after manic-like episode.  Interval History Patient was seen today for re-evaluation.  Nursing reports no events overnight. The patient has no issues with performing ADLs.  Patient has been medication compliant.  Patient slept 2h29m last night, which is some improvement from a night before.  Subjective:  On assessment patient is hypertalkative and tangential; she told me several stories including stories about several family members; she had to be redirected to stick to a specific topic. 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  To draw conclusions from the conversation, patient denies depression, reports anxiety, insomnia, denies thoughts about harming self or others, no hallucinations. Still no insight ibn her mental condition and very questionable judgement. The patient reports no side effects from medications.    Labs: no new results for review.      Total Time spent with patient: 30 minutes  Past Psychiatric History: see H&P  Past Medical History:  Past Medical History:  Diagnosis Date  . Breast cancer ([place])   . Cancer ([place]) 09/2018   Right breast  . COVID-19 11/2018  . Family history of breast cancer   . Hypertension   . Personal history of radiation therapy 2020   Rt breast    Past Surgical History:  Procedure Laterality Date  . BREAST BIOPSY Right 09/12/2018   [place] core/"venus clip"-INVASIVE MAMMARYLobular CARCINOMA, WITH LOBULAR FEATURES  . BREAST LUMPECTOMY Right 10/04/2018  . BREAST  LUMPECTOMY WITH NEEDLE LOCALIZATION AND AXILLARY SENTINEL LYMPH NODE BX Right 10/04/2018   Procedure: BREAST LUMPECTOMY WITH NEEDLE LOCALIZATION AND AXILLARY SENTINEL LYMPH NODE BX;  Surgeon: Herbert Pun, MD;  Location: [facility] ORS;  Service: General;  Laterality: Right;  . TONSILLECTOMY     Family History:  Family History  Problem Relation Age of Onset  . Breast cancer Paternal Aunt    Family Psychiatric  History: see H&P  Social History:  Social History   Substance and Sexual Activity  Alcohol Use Not Currently   Comment: not taken for about 6 weeks     Social History   Substance and Sexual Activity  Drug Use Yes  . Types: Marijuana   Comment: Uses street opioids    Social History   Socioeconomic History  . Marital status: Married    Spouse name: Not on file  . Number of children: Not on file  . Years of education: Not on file  . Highest education level: Not on file  Occupational History  . Not on file  Tobacco Use  . Smoking status: Current Every Day Smoker    Packs/day: 1.00    Years: 15.00    Pack years: 15.00    Types: Cigarettes  . Smokeless tobacco: Never Used  . Tobacco comment: patient declined  Vaping Use  . Vaping Use: Former  Substance and Sexual Activity  . Alcohol use: Not Currently    Comment: not taken for about 6 weeks  . Drug use: Yes    Types: Marijuana    Comment: Uses street opioids  . Sexual activity: Not on file  Other Topics Concern  . Not  on file  Social History Narrative   Works at [employer];  Smoke 15 cig/day; No alcohol abuse; in [employer]; with husband.    Social Determinants of Health   Financial Resource Strain: Not on file  Food Insecurity: Not on file  Transportation Needs: Not on file  Physical Activity: Not on file  Stress: Not on file  Social Connections: Not on file   Additional Social History:                         Sleep: Poor  Appetite:  Good  Current  Medications: Current Facility-Administered Medications  Medication Dose Route Frequency Provider Last Rate Last Admin  . acetaminophen (TYLENOL) tablet 650 mg  650 mg Oral Q6H PRN Clapacs, [name], MD   650 mg at 01/10/21 0026  . albuterol (VENTOLIN HFA) 108 (90 Base) MCG/ACT inhaler 1-2 puff  1-2 puff Inhalation Q4H PRN [name], MD   2 puff at 01/10/21 [phone]  . alum & mag hydroxide-simeth (MAALOX/MYLANTA) 200-200-20 MG/5ML suspension 30 mL  30 mL Oral Q4H PRN Clapacs, John T, MD      . divalproex (DEPAKOTE ER) 24 hr tablet 1,000 mg  1,000 mg Oral QHS [name], MD   1,000 mg at 01/09/21 2127  . docusate sodium (COLACE) capsule 100 mg  100 mg Oral BID Clapacs, [name], MD   100 mg at 01/10/21 3704  . feeding supplement (ENSURE ENLIVE / ENSURE PLUS) liquid 237 mL  237 mL Oral TID BM [name], MD   237 mL at 01/10/21 1037  . hydrOXYzine (ATARAX/VISTARIL) tablet 50 mg  50 mg Oral TID PRN [name], MD   50 mg at 01/10/21 0325  . ibuprofen (ADVIL) tablet 600 mg  600 mg Oral Q6H PRN Caroline Sauger, NP   600 mg at 01/10/21 0324  . magnesium hydroxide (MILK OF MAGNESIA) suspension 30 mL  30 mL Oral Daily PRN Clapacs, [name], MD   30 mL at 01/08/21 0945  . OLANZapine (ZYPREXA) tablet 10 mg  10 mg Oral Q6H PRN [name], MD   10 mg at 01/10/21 0325  . OLANZapine zydis (ZYPREXA) disintegrating tablet 10 mg  10 mg Oral Q12H [name], MD   10 mg at 01/10/21 8889  . pantoprazole (PROTONIX) EC tablet 40 mg  40 mg Oral Daily [name], MD   40 mg at 01/10/21 1694  . temazepam (RESTORIL) capsule 15 mg  15 mg Oral QHS PRN [name], MD   15 mg at 01/10/21 0026  . ziprasidone (GEODON) injection 20 mg  20 mg Intramuscular Q8H PRN [name], MD   20 mg at 01/09/21 1313    Lab Results: No results found for this or any previous visit (from the past 48 hour(s)).  Blood Alcohol level:  Lab Results  Component Value Date   ETH <10 50/38/8828     Metabolic Disorder Labs: Lab Results  Component Value Date   HGBA1C 5.3 01/07/2021   MPG 105 01/07/2021   No results found for: PROLACTIN Lab Results  Component Value Date   CHOL 224 (H) 01/07/2021   TRIG 85 01/07/2021   HDL 56 01/07/2021   CHOLHDL 4.0 01/07/2021   VLDL 17 01/07/2021   LDLCALC 151 (H) 01/07/2021    Physical Findings: AIMS:  , ,  ,  ,    CIWA:    COWS:  Musculoskeletal: Strength & Muscle Tone: within normal limits Gait & Station: normal Patient leans: N/A  Psychiatric Specialty Exam:  Presentation  General Appearance: Fairly Groomed  Eye Contact:Good  Speech:Pressured  Speech Volume:Increased  Handedness:Right   Mood and Affect  Mood:Euphoric  Affect: Labile  Thought Process  Thought Processes:Disorganized  Descriptions of Associations:Loose  Orientation:Full (Time, Place and Person)  Thought Content:Tangential  History of Schizophrenia/Schizoaffective disorder:No  Duration of Psychotic Symptoms:No data recorded Hallucinations: denies Ideas of Reference:None  Suicidal Thoughts: denies Homicidal Thoughts: denies  Sensorium  Memory:Immediate Fair; Recent Fair; Remote [place]   Executive Functions  Concentration:Poor  Attention Span:Poor  [place]   Psychomotor Activity  Psychomotor Activity:No data recorded  Assets  Assets:Desire for Improvement; Housing; Intimacy; Physical Health; Resilience   Sleep  Sleep:No data recorded   Physical Exam: Physical Exam ROS Blood pressure 126/87, pulse 81, temperature 97.8 F (36.6 C), temperature source Oral, resp. rate 18, height 5' 4.5" (1.638 m), weight 68.5 kg, last menstrual period 09/20/2018, SpO2 98 %. Body mass index is 25.52 kg/m.   Treatment Plan Summary: Daily contact with patient to assess and evaluate symptoms and progress in treatment and Medication  management  Patient is a57 year old female with the above-stated past psychiatric history who is seen in follow-up.  Chart reviewed. Patient discussed with nursing. Patient remains manic with poor insight and judjment. She sleeps slightly better.   Plan:  -continue inpatient psych admission; 15-minute checks; daily contact with patient to assess and evaluate symptoms and progress in treatment; psychoeducation.  -continue Zyprexa Zydis 10 mg twice a day for acute mania.  continue Depakote ER 1000 mg QHS - increased yesterday. VPA level next week. continue Temazepam 15 mg QHS PRN for insomnia.   continue Protonix 40 mg daily for heartburn.   -Pertinent Labs: no new labs ordered today  -EKG: on 5/24 showed Qtc 45ms with normal sinus rhythm    -Consults: No new consults placed since yesterday    -Disposition: Patient is not ready for discharge yet. All necessary aftercare will be arranged prior to discharge Likely d/c home with outpatient psych follow-up.  -  I certify that the patient does need, on a daily basis, active treatment furnished directly by or requiring the supervision of inpatient psychiatric facility personnel.   Larita Fife, MD 01/10/2021, 11:23 AM

## 2021-01-10 NOTE — Progress Notes (Signed)
Pt has been very needy. She has calmed down after a PRN. She has been cooperative and social and seems to be acting better. Collier Bullock Rn

## 2021-01-10 NOTE — Progress Notes (Signed)
Patient was intrusive, hyperactive, and agitated tonight.  She would attempt to engage in sexual talks but said nurse would ask her to stop talking about sexual activity.

## 2021-01-10 NOTE — Plan of Care (Signed)
Pt rates anxiety 2/10. Pt denies depression, SI, HI and AVH. Pt was educated on care plan and verbalizes understanding. Collier Bullock RN Problem: Education: Goal: Knowledge of [place] [facility] Education information/materials will improve Outcome: Progressing Goal: Verbalization of understanding the information provided will improve Outcome: Progressing   Problem: Health Behavior/Discharge Planning: Goal: Identification of resources available to assist in meeting health care needs will improve Outcome: Progressing Goal: Compliance with treatment plan for underlying cause of condition will improve Outcome: Progressing   Problem: Coping: Goal: Ability to identify and develop effective coping behavior will improve Outcome: Progressing Goal: Ability to interact with others will improve Outcome: Progressing Goal: Demonstration of participation in decision-making regarding own care will improve Outcome: Progressing Goal: Ability to use eye contact when communicating with others will improve Outcome: Progressing   Problem: Self-Concept: Goal: Will verbalize positive feelings about self Outcome: Progressing

## 2021-01-10 NOTE — BHH Group Notes (Signed)
LCSW Group Therapy Note  01/10/2021 10:23 AM  Type of Therapy and Topic:  Group Therapy: Avoiding Self-Sabotaging and Enabling Behaviors  Participation Level:  Minimal   Description of Group:   In this group, patients will learn how to identify obstacles, self-sabotaging and enabling behaviors, as well as: what are they, why do we do them and what needs these behaviors meet. Discuss unhealthy relationships and how to have positive healthy boundaries with those that sabotage and enable. Explore aspects of self-sabotage and enabling in yourself and how to limit these self-destructive behaviors in everyday life.   Therapeutic Goals: 1. Patient will identify one obstacle that relates to self-sabotage and enabling behaviors 2. Patient will identify one personal self-sabotaging or enabling behavior they did prior to admission 3. Patient will state a plan to change the above identified behavior 4. Patient will demonstrate ability to communicate their needs through discussion and/or role play.   Summary of Patient Progress: Patient was present in group. Patient was somewhat participatory, however had a flight of ideas and often had to be redirected.  Patient was able to discuss how attitude can sabotage.    Therapeutic Modalities:   Cognitive Behavioral Therapy Person-Centered Therapy Motivational Interviewing  Assunta Curtis, MSW, LCSW 01/10/2021 10:23 AM

## 2021-01-11 DIAGNOSIS — F3113 Bipolar disorder, current episode manic without psychotic features, severe: Secondary | ICD-10-CM | POA: Diagnosis not present

## 2021-01-11 MED ORDER — OLANZAPINE 5 MG PO TBDP: 10.0000 mg | ORAL_TABLET | Freq: Every morning | ORAL | Status: DC

## 2021-01-11 MED ORDER — OLANZAPINE 5 MG PO TBDP
15.0000 mg | ORAL_TABLET | Freq: Every day | ORAL | Status: DC
  Administered 2021-01-11: 15 mg via ORAL
  Filled 2021-01-11: qty 3

## 2021-01-11 NOTE — Progress Notes (Signed)
Patient presents less intrusive with staff and peers than when this writer saw her during this admission. Patient denies SI/HI/AVH. Patient observed interacting appropriately with staff and peers on the unit. Patient compliant with medication administration per MD orders. Patient being monitored Q 15 minutes for safety per unit protocol. Pt remains safe on the unit.

## 2021-01-11 NOTE — Progress Notes (Signed)
Patient is A & O x4.  Pt is very needy and would come to the desk throughout the night.  She would slam the doors and kept asking for food.  She was cooperative and social.  She was compliant with her medication.  Patient would pace the throughout the unit. Continued to monitor patient every Q15 minutes for safety and progress.

## 2021-01-11 NOTE — Progress Notes (Signed)
Pt rates depression 5/10 and anxiety 3/10, Pt denies SI, HI and AVH. Pt has been very social, talkative and has a worrisome affect. She has slept more today. She seems less disorganized. Pt has been med compliant. Collier Bullock RN

## 2021-01-11 NOTE — Plan of Care (Signed)
Patient presents with a better affect than when this writer had her previously during this admission   Problem: Education: Goal: Emotional status will improve Outcome: Progressing Goal: Mental status will improve Outcome: Progressing

## 2021-01-11 NOTE — Progress Notes (Signed)
Penny Chase  01/11/2021 10:41 AM Penny Chase  MRN:  [id number]  Principal Problem: Bipolar affective disorder, current episode manic ([place]) Diagnosis: Principal Problem:   Bipolar affective disorder, current episode manic ([place]) Active Problems:   Psychosis ([place])   Gastroesophageal reflux disease without esophagitis  Penny Chase is a 50 y.o. female without psychiatric history presenting after manic-like episode.  Interval History Patient was seen today for re-evaluation.  Nursing reports no unsafe events overnight, although the patient slept minimally (1.45h), was coming out of her room multiple times making multiple requests. Patient remains hypersocial. Patient has been medication compliant.    Subjective:  On assessment patient is talkative, less tangential then yesterday and able to stay within a topic in some conversations. Reports feeling "great" and states she slept "about four hours". She denies depression, denies thoughts about harming self or others, denies auditory or visua hallucinations. Still no insight in her mental condition and very questionable judgement. The patient reports no side effects from medications. She spends all her time in the community areas, overly-social, singing loudly.  Labs: no new results for review.   Total Time spent with patient: 30 minutes  Past Psychiatric History: see H&P  Past Medical History:  Past Medical History:  Diagnosis Date  . Breast cancer ([facility])   . Cancer ([place]) 09/2018   Right breast  . COVID-19 11/2018  . Family history of breast cancer   . Hypertension   . Personal history of radiation therapy 2020   Rt breast    Past Surgical History:  Procedure Laterality Date  . BREAST BIOPSY Right 09/12/2018   [place] core/"venus clip"-INVASIVE MAMMARYLobular CARCINOMA, WITH LOBULAR FEATURES  . BREAST LUMPECTOMY Right 10/04/2018  . BREAST LUMPECTOMY WITH NEEDLE LOCALIZATION AND AXILLARY SENTINEL LYMPH NODE BX Right 10/04/2018    Procedure: BREAST LUMPECTOMY WITH NEEDLE LOCALIZATION AND AXILLARY SENTINEL LYMPH NODE BX;  Surgeon: Herbert Pun, MD;  Location: [facility] ORS;  Service: General;  Laterality: Right;  . TONSILLECTOMY     Family History:  Family History  Problem Relation Age of Onset  . Breast cancer Paternal Aunt    Family Psychiatric  History: see H&P  Social History:  Social History   Substance and Sexual Activity  Alcohol Use Not Currently   Comment: not taken for about 6 weeks     Social History   Substance and Sexual Activity  Drug Use Yes  . Types: Marijuana   Comment: Uses street opioids    Social History   Socioeconomic History  . Marital status: Married    Spouse name: Not on file  . Number of children: Not on file  . Years of education: Not on file  . Highest education level: Not on file  Occupational History  . Not on file  Tobacco Use  . Smoking status: Current Every Day Smoker    Packs/day: 1.00    Years: 15.00    Pack years: 15.00    Types: Cigarettes  . Smokeless tobacco: Never Used  . Tobacco comment: patient declined  Vaping Use  . Vaping Use: Former  Substance and Sexual Activity  . Alcohol use: Not Currently    Comment: not taken for about 6 weeks  . Drug use: Yes    Types: Marijuana    Comment: Uses street opioids  . Sexual activity: Not on file  Other Topics Concern  . Not on file  Social History Narrative   Works at [employer];  Smoke 15 cig/day; No  alcohol abuse; in [employer]; with husband.    Social Determinants of Health   Financial Resource Strain: Not on file  Food Insecurity: Not on file  Transportation Needs: Not on file  Physical Activity: Not on file  Stress: Not on file  Social Connections: Not on file   Additional Social History:                         Sleep: Poor  Appetite:  Good  Current Medications: Current Facility-Administered Medications  Medication Dose Route Frequency Provider Last  Rate Last Admin  . acetaminophen (TYLENOL) tablet 650 mg  650 mg Oral Q6H PRN Clapacs, [name], MD   650 mg at 01/10/21 2301  . albuterol (VENTOLIN HFA) 108 (90 Base) MCG/ACT inhaler 1-2 puff  1-2 puff Inhalation Q4H PRN [name], MD   2 puff at 01/10/21 [phone]  . alum & mag hydroxide-simeth (MAALOX/MYLANTA) 200-200-20 MG/5ML suspension 30 mL  30 mL Oral Q4H PRN Clapacs, John T, MD      . divalproex (DEPAKOTE ER) 24 hr tablet 1,000 mg  1,000 mg Oral QHS [name], MD   1,000 mg at 01/10/21 2005  . docusate sodium (COLACE) capsule 100 mg  100 mg Oral BID Clapacs, [name], MD   100 mg at 01/11/21 1027  . feeding supplement (ENSURE ENLIVE / ENSURE PLUS) liquid 237 mL  237 mL Oral TID BM [name], MD   237 mL at 01/10/21 2007  . hydrOXYzine (ATARAX/VISTARIL) tablet 50 mg  50 mg Oral TID PRN [name], MD   50 mg at 01/10/21 1257  . ibuprofen (ADVIL) tablet 600 mg  600 mg Oral Q6H PRN Caroline Sauger, NP   600 mg at 01/10/21 1300  . magnesium hydroxide (MILK OF MAGNESIA) suspension 30 mL  30 mL Oral Daily PRN Clapacs, [name], MD   30 mL at 01/08/21 0945  . OLANZapine (ZYPREXA) tablet 10 mg  10 mg Oral Q6H PRN [name], MD   10 mg at 01/10/21 0325  . [START ON 01/12/2021] OLANZapine zydis (ZYPREXA) disintegrating tablet 10 mg  10 mg Oral q morning Ellina Sivertsen, MD      . OLANZapine zydis (ZYPREXA) disintegrating tablet 15 mg  15 mg Oral QHS Tavin Vernet, MD      . pantoprazole (PROTONIX) EC tablet 40 mg  40 mg Oral Daily [name], MD   40 mg at 01/11/21 2536  . temazepam (RESTORIL) capsule 15 mg  15 mg Oral QHS PRN [name], MD   15 mg at 01/10/21 0026  . ziprasidone (GEODON) injection 20 mg  20 mg Intramuscular Q8H PRN [name], MD   20 mg at 01/09/21 1313    Lab Results: No results found for this or any previous visit (from the past 48 hour(s)).  Blood Alcohol level:  Lab Results  Component Value Date   ETH <10 64/40/3474     Metabolic Disorder Labs: Lab Results  Component Value Date   HGBA1C 5.3 01/07/2021   MPG 105 01/07/2021   No results found for: PROLACTIN Lab Results  Component Value Date   CHOL 224 (H) 01/07/2021   TRIG 85 01/07/2021   HDL 56 01/07/2021   CHOLHDL 4.0 01/07/2021   VLDL 17 01/07/2021   LDLCALC 151 (H) 01/07/2021    Physical Findings: AIMS:  , ,  ,  ,    CIWA:  COWS:     Musculoskeletal: Strength & Muscle Tone: within normal limits Gait & Station: normal Patient leans: N/A  Psychiatric Specialty Exam:  Presentation  General Appearance: Fairly Groomed  Eye Contact:Good  Speech:Pressured  Speech Volume:Increased  Handedness:Right   Mood and Affect  Mood:Euphoric  Affect: Labile  Thought Process  Thought Processes:Disorganized  Descriptions of Associations:Loose  Orientation:Full (Time, Place and Person)  Thought Content:Tangential  History of Schizophrenia/Schizoaffective disorder:No  Duration of Psychotic Symptoms:No data recorded Hallucinations: denies Ideas of Reference:None  Suicidal Thoughts: denies Homicidal Thoughts: denies  Sensorium  Memory:Immediate Fair; Recent Fair; Remote [place]   Executive Functions  Concentration:Poor  Attention Span:Poor  [place]   Psychomotor Activity  Psychomotor Activity:No data recorded  Assets  Assets:Desire for Improvement; Housing; Intimacy; Physical Health; Resilience   Sleep  Sleep:No data recorded   Physical Exam: Physical Exam  ROS  Blood pressure 126/85, pulse 74, temperature 97.8 F (36.6 C), temperature source Oral, resp. rate 18, height 5' 4.5" (1.638 m), weight 68.5 kg, last menstrual period 09/20/2018, SpO2 98 %. Body mass index is 25.52 kg/m.   Treatment Plan Summary: Daily contact with patient to assess and evaluate symptoms and progress in treatment and Medication  management  Patient is a 50 year old female with the above-stated past psychiatric history who is seen in follow-up.  Chart reviewed. Patient discussed with nursing. Patient remains manic with poor sleep, no insight and poor judjment.  Will increase the dose of nighttime antipsychotic. Will  Continue other medications without changes.   Plan:  -continue inpatient psych admission; 15-minute checks; daily contact with patient to assess and evaluate symptoms and progress in treatment; psychoeducation.  -continue Zyprexa Zydis 10 mg in AM, increase to 15mg  QHS for acute mania.  continue Depakote ER 1000 mg QHS - increased 2 days ago. VPA level next week. continue Temazepam 15 mg QHS PRN for insomnia.   continue Protonix 40 mg daily for heartburn.  -EKG: on 5/24 showed Qtc 458ms with normal sinus rhythm    -Consults: No new consults placed since yesterday    -Disposition: Patient is not ready for discharge yet. All necessary aftercare will be arranged prior to discharge Likely d/c home with outpatient psych follow-up.  -  I certify that the patient does need, on a daily basis, active treatment furnished directly by or requiring the supervision of inpatient psychiatric facility personnel.   Larita Fife, MD 01/11/2021, 10:41 AM

## 2021-01-11 NOTE — Plan of Care (Signed)
Pt rates anxiety 2/10. Pt denies depression, SI, HI and AVH. Pt was educated on care plan and verbalizes understanding. Collier Bullock RN Problem: Education: Goal: Knowledge of [place] [facility] Education information/materials will improve Outcome: Progressing Goal: Verbalization of understanding the information provided will improve Outcome: Progressing   Problem: Health Behavior/Discharge Planning: Goal: Identification of resources available to assist in meeting health care needs will improve Outcome: Progressing Goal: Compliance with treatment plan for underlying cause of condition will improve Outcome: Progressing   Problem: Coping: Goal: Ability to identify and develop effective coping behavior will improve Outcome: Progressing Goal: Ability to interact with others will improve Outcome: Progressing Goal: Demonstration of participation in decision-making regarding own care will improve Outcome: Progressing Goal: Ability to use eye contact when communicating with others will improve Outcome: Progressing   Problem: Self-Concept: Goal: Will verbalize positive feelings about self Outcome: Progressing

## 2021-01-11 NOTE — [facility] Group Notes (Signed)
LCSW Group Therapy Note  01/11/2021 1:58 PM  Type of Therapy and Topic:  Group Therapy:  Feelings around Relapse and Recovery  Participation Level:  Minimal   Description of Group:    Patients in this group will discuss emotions they experience before and after a relapse. They will process how experiencing these feelings, or avoidance of experiencing them, relates to having a relapse. Facilitator will guide patients to explore emotions they have related to recovery. Patients will be encouraged to process which emotions are more powerful. They will be guided to discuss the emotional reaction significant others in their lives may have to their relapse or recovery. Patients will be assisted in exploring ways to respond to the emotions of others without this contributing to a relapse.  Therapeutic Goals: 1. Patient will identify two or more emotions that lead to a relapse for them 2. Patient will identify two emotions that result when they relapse 3. Patient will identify two emotions related to recovery 4. Patient will demonstrate ability to communicate their needs through discussion and/or role plays   Summary of Patient Progress: Patient left and returned throughout groups session. Contributions were tangential to conversation and dramatic/attention seeking in nature. Patient at one point stood up and swung her hands as to pretend to swing a chair (paitent had nothing in her hands). Patient remains manic and responds inappropriately.   Therapeutic Modalities:   Cognitive Behavioral Therapy Solution-Focused Therapy Assertiveness Training Relapse Prevention Therapy   Paulla Dolly, MSW, [place] 01/11/2021 1:58 PM

## 2021-01-12 DIAGNOSIS — F3113 Bipolar disorder, current episode manic without psychotic features, severe: Secondary | ICD-10-CM | POA: Diagnosis not present

## 2021-01-12 MED ORDER — OLANZAPINE 5 MG PO TBDP
5.0000 mg | ORAL_TABLET | Freq: Every morning | ORAL | Status: DC
  Administered 2021-01-13 – 2021-01-15 (×3): 5 mg via ORAL
  Filled 2021-01-12 (×3): qty 1

## 2021-01-12 MED ORDER — OLANZAPINE 5 MG PO TBDP
20.0000 mg | ORAL_TABLET | Freq: Every day | ORAL | Status: DC
  Administered 2021-01-12 – 2021-01-14 (×3): 20 mg via ORAL
  Filled 2021-01-12 (×3): qty 4

## 2021-01-12 MED ORDER — PROPRANOLOL HCL 20 MG PO TABS
10.0000 mg | ORAL_TABLET | Freq: Three times a day (TID) | ORAL | Status: DC | PRN
  Administered 2021-01-13 – 2021-01-15 (×2): 10 mg via ORAL
  Filled 2021-01-12 (×2): qty 1
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
## 2021-01-12 NOTE — Progress Notes (Signed)
Patient compliant with medication administration per MD orders
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
## 2021-01-12 NOTE — Progress Notes (Signed)
Patient placed on 1:1 for safety after given PRN medications. Sitter with patient and the patient remains safe on the unit.
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
## 2021-01-12 NOTE — Progress Notes (Signed)
Recreation Therapy Notes  Date: 01/12/2021  Time: 9:30 am   Location: Court yard   Behavioral response: N/A   Intervention Topic: Social skills    Discussion/Intervention: Patient did not attend group.   Clinical Observations/Feedback:  Patient did not attend group.   Satish Hammers LRT/CTRS        Maximilian Tallo 01/12/2021 12:00 PM

## 2021-01-12 NOTE — Tx Team (Signed)
Interdisciplinary Treatment and Diagnostic Plan Update  01/12/2021 Time of Session: 8:30AM Penny Chase MRN: [id number]  Principal Diagnosis: Bipolar affective disorder, current episode manic ([place])  Secondary Diagnoses: Principal Problem:   Bipolar affective disorder, current episode manic ([place]) Active Problems:   Psychosis ([place])   Gastroesophageal reflux disease without esophagitis   Current Medications:  Current Facility-Administered Medications  Medication Dose Route Frequency Provider Last Rate Last Admin  . acetaminophen (TYLENOL) tablet 650 mg  650 mg Oral Q6H PRN Clapacs, [name], MD   650 mg at 01/11/21 1649  . albuterol (VENTOLIN HFA) 108 (90 Base) MCG/ACT inhaler 1-2 puff  1-2 puff Inhalation Q4H PRN [name], MD   2 puff at 01/11/21 1227  . alum & mag hydroxide-simeth (MAALOX/MYLANTA) 200-200-20 MG/5ML suspension 30 mL  30 mL Oral Q4H PRN Clapacs, John T, MD      . divalproex (DEPAKOTE ER) 24 hr tablet 1,000 mg  1,000 mg Oral QHS [name], MD   1,000 mg at 01/11/21 2112  . docusate sodium (COLACE) capsule 100 mg  100 mg Oral BID Clapacs, [name], MD   100 mg at 01/12/21 0829  . feeding supplement (ENSURE ENLIVE / ENSURE PLUS) liquid 237 mL  237 mL Oral TID BM [name], MD   237 mL at 01/12/21 0935  . hydrOXYzine (ATARAX/VISTARIL) tablet 50 mg  50 mg Oral TID PRN [name], MD   50 mg at 01/11/21 1226  . ibuprofen (ADVIL) tablet 600 mg  600 mg Oral Q6H PRN Caroline Sauger, NP   600 mg at 01/10/21 1300  . magnesium hydroxide (MILK OF MAGNESIA) suspension 30 mL  30 mL Oral Daily PRN Clapacs, [name], MD   30 mL at 01/08/21 0945  . OLANZapine (ZYPREXA) tablet 10 mg  10 mg Oral Q6H PRN [name], MD   10 mg at 01/12/21 0831  . OLANZapine zydis (ZYPREXA) disintegrating tablet 10 mg  10 mg Oral q morning Paliy, Alisa, MD      . OLANZapine zydis (ZYPREXA) disintegrating tablet 15 mg  15 mg Oral QHS Larita Fife, MD   15 mg at 01/11/21 2112  .  pantoprazole (PROTONIX) EC tablet 40 mg  40 mg Oral Daily [name], MD   40 mg at 01/12/21 2683  . temazepam (RESTORIL) capsule 15 mg  15 mg Oral QHS PRN [name], MD   15 mg at 01/11/21 2112  . ziprasidone (GEODON) injection 20 mg  20 mg Intramuscular Q8H PRN [name], MD   20 mg at 01/12/21 0147   PTA Medications: Medications Prior to Admission  Medication Sig Dispense Refill Last Dose  . acetaminophen (TYLENOL) 500 MG tablet Take 500 mg by mouth every 6 (six) hours as needed for moderate pain.     Marland Kitchen amLODipine (NORVASC) 5 MG tablet Take 5 mg by mouth daily. (Patient not taking: No sig reported)     . aspirin-sod bicarb-citric acid (ALKA-SELTZER) 325 MG TBEF tablet Take 325 mg by mouth every 6 (six) hours as needed (indigestion).     . celecoxib (CELEBREX) 100 MG capsule Take 100 mg by mouth 2 (two) times daily as needed.     . citalopram (CELEXA) 20 MG tablet TAKE 1 TABLET BY MOUTH EVERY DAY 30 tablet 3   . hydrochlorothiazide (HYDRODIURIL) 25 MG tablet Take 1 tablet by mouth daily.     . pantoprazole (PROTONIX) 40 MG tablet Take 40 mg by mouth  daily as needed (heartburn).      Marland Kitchen PROVENTIL HFA 108 (90 Base) MCG/ACT inhaler Inhale 2 puffs into the lungs every 4 (four) hours as needed.     . tamoxifen (NOLVADEX) 20 MG tablet TAKE 1 TABLET BY MOUTH EVERY DAY 90 tablet 3     Patient Stressors: Health problems Marital or family conflict Substance abuse  Patient Strengths: Average or above average intelligence Communication skills Motivation for treatment/growth Supportive family/friends  Treatment Modalities: Medication Management, Group therapy, Case management,  1 to 1 session with clinician, Psychoeducation, Recreational therapy.   Physician Treatment Plan for Primary Diagnosis: Bipolar affective disorder, current episode manic ([place]) Long Term Goal(s): Improvement in symptoms so as ready for discharge Improvement in symptoms so as ready for discharge    Short Term Goals: Ability to identify changes in lifestyle to reduce recurrence of condition will improve Ability to verbalize feelings will improve Ability to disclose and discuss suicidal ideas Ability to demonstrate self-control will improve Ability to identify and develop effective coping behaviors will improve Ability to maintain clinical measurements within normal limits will improve Compliance with prescribed medications will improve Ability to identify triggers associated with substance abuse/mental health issues will improve Ability to identify changes in lifestyle to reduce recurrence of condition will improve Ability to verbalize feelings will improve Ability to disclose and discuss suicidal ideas Ability to demonstrate self-control will improve Ability to identify and develop effective coping behaviors will improve Ability to maintain clinical measurements within normal limits will improve Compliance with prescribed medications will improve Ability to identify triggers associated with substance abuse/mental health issues will improve  Medication Management: Evaluate patient's response, side effects, and tolerance of medication regimen.  Therapeutic Interventions: 1 to 1 sessions, Unit Group sessions and Medication administration.  Evaluation of Outcomes: Progressing  Physician Treatment Plan for Secondary Diagnosis: Principal Problem:   Bipolar affective disorder, current episode manic ([place]) Active Problems:   Psychosis ([place])   Gastroesophageal reflux disease without esophagitis  Long Term Goal(s): Improvement in symptoms so as ready for discharge Improvement in symptoms so as ready for discharge   Short Term Goals: Ability to identify changes in lifestyle to reduce recurrence of condition will improve Ability to verbalize feelings will improve Ability to disclose and discuss suicidal ideas Ability to demonstrate self-control will improve Ability to identify and  develop effective coping behaviors will improve Ability to maintain clinical measurements within normal limits will improve Compliance with prescribed medications will improve Ability to identify triggers associated with substance abuse/mental health issues will improve Ability to identify changes in lifestyle to reduce recurrence of condition will improve Ability to verbalize feelings will improve Ability to disclose and discuss suicidal ideas Ability to demonstrate self-control will improve Ability to identify and develop effective coping behaviors will improve Ability to maintain clinical measurements within normal limits will improve Compliance with prescribed medications will improve Ability to identify triggers associated with substance abuse/mental health issues will improve     Medication Management: Evaluate patient's response, side effects, and tolerance of medication regimen.  Therapeutic Interventions: 1 to 1 sessions, Unit Group sessions and Medication administration.  Evaluation of Outcomes: Progressing   RN Treatment Plan for Primary Diagnosis: Bipolar affective disorder, current episode manic ([place]) Long Term Goal(s): Knowledge of disease and therapeutic regimen to maintain health will improve  Short Term Goals: Ability to remain free from injury will improve, Ability to verbalize frustration and anger appropriately will improve, Ability to demonstrate self-control, Ability to participate in decision making will  improve, Ability to verbalize feelings will improve, Ability to disclose and discuss suicidal ideas, Ability to identify and develop effective coping behaviors will improve and Compliance with prescribed medications will improve  Medication Management: RN will administer medications as ordered by provider, will assess and evaluate patient's response and provide education to patient for prescribed medication. RN will report any adverse and/or side effects to prescribing  provider.  Therapeutic Interventions: 1 on 1 counseling sessions, Psychoeducation, Medication administration, Evaluate responses to treatment, Monitor vital signs and CBGs as ordered, Perform/monitor CIWA, COWS, AIMS and Fall Risk screenings as ordered, Perform wound care treatments as ordered.  Evaluation of Outcomes: Progressing   LCSW Treatment Plan for Primary Diagnosis: Bipolar affective disorder, current episode manic ([place]) Long Term Goal(s): Safe transition to appropriate next level of care at discharge, Engage patient in therapeutic group addressing interpersonal concerns.  Short Term Goals: Engage patient in aftercare planning with referrals and resources, Increase social support, Increase ability to appropriately verbalize feelings, Increase emotional regulation, Facilitate acceptance of mental health diagnosis and concerns, Facilitate patient progression through stages of change regarding substance use diagnoses and concerns, Identify triggers associated with mental health/substance abuse issues and Increase skills for wellness and recovery  Therapeutic Interventions: Assess for all discharge needs, 1 to 1 time with Social worker, Explore available resources and support systems, Assess for adequacy in community support network, Educate family and significant other(s) on suicide prevention, Complete Psychosocial Assessment, Interpersonal group therapy.  Evaluation of Outcomes: Progressing   Progress in Treatment: Attending groups: Yes. Participating in groups: Yes. Taking medication as prescribed: Yes. Toleration medication: Yes. Family/Significant other contact made: Yes, individual(s) contacted:  SPE completed with the patient's brother. Patient understands diagnosis: No. Discussing patient identified problems/goals with staff: Yes. Medical problems stabilized or resolved: Yes. Denies suicidal/homicidal ideation: Yes. Issues/concerns per patient self-inventory: No. Other:  None  New problem(s) identified: No, Describe:  None  New Short Term/Long Term Goal(s): detox, elimination of symptoms of psychosis, medication management for mood stabilization; elimination of SI thoughts; development of comprehensive mental wellness/sobriety plan. Update 01/12/2021: No changes at this time.   Patient Goals:  "I like to plant flowers, I like to be outside" "smoking cigarettes" "go home and get my clothes" Update 01/12/2021: No changes at this time.   Discharge Plan or Barriers: CSW will assist pt in appropriate discharge and follow up care plan. Update 01/12/2021: Pt reports plans to return to her home.  CSW will assist pt in developing appropriate discharge plans.  Reason for Continuation of Hospitalization: Aggression Mania Medical Issues Medication stabilization  Estimated Length of Stay: TBD  Recreational Therapy: Patient Stressors: N/A Patient Goal: Patient will engage in groups without prompting or encouragement from LRT x3 group sessions within 5 recreation therapy group sessions.  Attendees: Patient:  01/12/2021 9:42 AM  Physician: [name], MD 01/12/2021 9:42 AM  Nursing:  01/12/2021 9:42 AM  RN Care Manager: 01/12/2021 9:42 AM  Social Worker: Assunta Curtis, MSW, LCSW 01/12/2021 9:42 AM  Recreational Therapist:  01/12/2021 9:42 AM  Other:  01/12/2021 9:42 AM  Other:  01/12/2021 9:42 AM  Other: 01/12/2021 9:42 AM    Scribe for Treatment Team: Rozann Lesches, LCSW 01/12/2021 9:42 AM

## 2021-01-12 NOTE — Progress Notes (Signed)
Patient on 1:1 for safety with a sitter present. Patient is calm and without complaint at this time. Patient given education, support, and encouragement to be active in her treatment plan. Patient remains safe on the unit

## 2021-01-12 NOTE — Plan of Care (Signed)
Patient presents irritable and intrusive with staff and peers   Problem: Education: Goal: Mental status will improve Outcome: Not Progressing

## 2021-01-12 NOTE — Progress Notes (Signed)
Patient irritable early this morning and verbally aggressive. Patient was given PRN Zyprexa along with other scheduled medications. Patient was compliant with medications and vital signs. On reassessment, patient is calm and cooperative. She states that she had a rough night last night because of "the small yellow pill" in her nighttime medication, which is making her mouth dry. Patient denies SI, HI, and AVH. Patient remains on 1:1 for safety. She is safe on the unit at this time.

## 2021-01-12 NOTE — Progress Notes (Signed)
Patient on 1:1 for safety with a sitter present. Patient continues to hit the doors and yell at staff. Patient remains safe on the unit.

## 2021-01-13 DIAGNOSIS — F3113 Bipolar disorder, current episode manic without psychotic features, severe: Secondary | ICD-10-CM | POA: Diagnosis not present

## 2021-01-13 LAB — VALPROIC ACID LEVEL: Valproic Acid Lvl: 54 ug/mL (ref 50.0–100.0)

## 2021-01-13 MED ORDER — FLUTICASONE PROPIONATE 50 MCG/ACT NA SUSP
2.0000 | Freq: Every day | NASAL | Status: DC
  Administered 2021-01-13 – 2021-01-15 (×3): 2 via NASAL
  Filled 2021-01-13: qty 16

## 2021-01-13 MED ORDER — HYDROCHLOROTHIAZIDE 25 MG PO TABS
25.0000 mg | ORAL_TABLET | Freq: Every day | ORAL | Status: DC
  Administered 2021-01-13 – 2021-01-15 (×3): 25 mg via ORAL
  Filled 2021-01-13 (×4): qty 1

## 2021-01-13 MED ORDER — HYDROCORTISONE 1 % EX CREA
TOPICAL_CREAM | Freq: Two times a day (BID) | CUTANEOUS | Status: DC
  Administered 2021-01-15: 1 via TOPICAL
  Filled 2021-01-13: qty 28

## 2021-01-13 NOTE — Progress Notes (Addendum)
Recreation Therapy Notes  Date: 01/13/2021  Time: 10:00 am   Location: Craft room   Behavioral response: Appropriate  Intervention Topic: Anger Management    Discussion/Intervention:  Group content on today was focused on anger management. The group defined anger and reasons they become angry. Individuals expressed negative way they have dealt with anger in the past. Patients stated some positive ways they could deal with anger in the future. The group described how anger can affect your health and daily plans. Individuals participated in the intervention "Score your anger" where they had a chance to answer questions about themselves and get a score of their anger.  Clinical Observations/Feedback: Patient came to group and was focused on what peers and staff had to say about anger management. She stated that she manages her anger by walking away and praying. Participant identified nausea, head ache and anxiety as symtoms of anger for her. Individual was social with staff and peers while participating in the intervention. Penny Chase LRT/CTRS         Penny Chase 01/13/2021 12:08 PM

## 2021-01-13 NOTE — Progress Notes (Signed)
Patient on 1:1 for safety. Patient continues to fight sleep and continues to come up to the nurses station stating, "I forgot what I was going to say." Patient is redirectable at this time. Pt remains safe on the unit.

## 2021-01-13 NOTE — [facility] Group Notes (Signed)
[facility] Group Notes:  (Nursing/MHT/Case Management/Adjunct)  Date:  01/13/2021  Time:  11:33 PM  Type of Therapy:  Group Therapy  Participation Level:  Active  Participation Quality:  Appropriate  Affect:  Appropriate  Cognitive:  Alert  Insight:  Good  Engagement in Group:    Modes of Intervention:  Support  Summary of Progress/Problems:  Penny Chase 01/13/2021, 11:33 PM

## 2021-01-13 NOTE — Progress Notes (Signed)
Patient denies SI, HI, and AVH. She is animated and cooperative on assessment. Patient says that "the little yellow pill" is making her sleepwalk. Patient also expresses some frustration that she is not getting all her medications she takes at home, including Flonase and a diuretic. Patient was encouraged to talk about this with the doctor. Patient is observed to be interacting appropriately with staff and other patients on the unit. Patient was compliant with scheduled medications this morning. Support and encouragement provided. Patient remains safe on the unit at this time and q15 min safety checks have been maintained.

## 2021-01-13 NOTE — [facility] Group Notes (Signed)
LCSW Group Therapy Note     01/13/2021 2:41 PM     Type of Therapy and Topic:  Group Therapy:  Overcoming Obstacles     Participation Level:  Active     Description of Group:     In this group patients will be encouraged to explore what they see as obstacles to their own wellness and recovery. They will be guided to discuss their thoughts, feelings, and behaviors related to these obstacles. The group will process together ways to cope with barriers, with attention given to specific choices patients can make. Each patient will be challenged to identify changes they are motivated to make in order to overcome their obstacles. This group will be process-oriented, with patients participating in exploration of their own experiences as well as giving and receiving support and challenge from other group members.     Therapeutic Goals:  1.    Patient will identify personal and current obstacles as they relate to admission.  2.    Patient will identify barriers that currently interfere with their wellness or overcoming obstacles.  3.    Patient will identify feelings, thought process and behaviors related to these barriers.  4.    Patient will identify two changes they are willing to make to overcome these obstacles:        Summary of Patient Progress: Patient was present for the entirety of the group session. Patient was an active listener and participated in the topic of discussion, provided helpful advice to others, and added nuance to topic of conversation. Pt stated that she wants to work on "keeping her mouth shut" because she feels like it upsets others and disrupts her relationship. She also stated that she wants to be able to distinguish between healthy and unhealthy relationships.       Therapeutic Modalities:    Cognitive Behavioral Therapy  Solution Focused Therapy  Motivational Interviewing  Relapse Prevention Therapy     Mariona Scholes [place], MSW, [place]  01/13/2021  2:41 PM
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
## 2021-01-14 DIAGNOSIS — F3113 Bipolar disorder, current episode manic without psychotic features, severe: Secondary | ICD-10-CM | POA: Diagnosis not present

## 2021-01-14 NOTE — Plan of Care (Signed)
  Problem: Education: Goal: Knowledge of [place] [facility] Education information/materials will improve Outcome: Progressing Goal: Verbalization of understanding the information provided will improve Outcome: Progressing   Problem: Health Behavior/Discharge Planning: Goal: Identification of resources available to assist in meeting health care needs will improve Outcome: Progressing Goal: Compliance with treatment plan for underlying cause of condition will improve Outcome: Progressing   Problem: Education: Goal: Knowledge of [facility] [facility] Education information/materials will improve Outcome: Progressing Goal: Emotional status will improve Outcome: Progressing Goal: Mental status will improve Outcome: Progressing Goal: Verbalization of understanding the information provided will improve Outcome: Progressing   Problem: Activity: Goal: Interest or engagement in activities will improve Outcome: Progressing Goal: Sleeping patterns will improve Outcome: Progressing   Problem: Coping: Goal: Ability to verbalize frustrations and anger appropriately will improve Outcome: Progressing Goal: Ability to demonstrate self-control will improve Outcome: Progressing   Problem: Coping: Goal: Ability to identify and develop effective coping behavior will improve Outcome: Progressing Goal: Ability to interact with others will improve Outcome: Progressing Goal: Demonstration of participation in decision-making regarding own care will improve Outcome: Progressing Goal: Ability to use eye contact when communicating with others will improve Outcome: Progressing   Problem: Self-Concept: Goal: Will verbalize positive feelings about self Outcome: Progressing

## 2021-01-14 NOTE — Plan of Care (Signed)
Patient appropriate with staff & peers. Pleasant and cooperative on approach. Patient states " stay away from drugs and stay close to Jesus." Rated her depression 0/10 and anxiety 3/10. Appetite and energy level good. Compliant with medications.Support and encouragement given.

## 2021-01-14 NOTE — Plan of Care (Signed)
  Problem: Group Participation Goal: STG - Patient will focus on task/topic with 2 prompts from staff within 5 recreation therapy group sessions Description: STG - Patient will focus on task/topic with 2 prompts from staff within 5 recreation therapy group sessions Outcome: Progressing

## 2021-01-14 NOTE — Progress Notes (Signed)
Patient appears calm and is cooperative. She is observed interacting appropriately with others on the unit. She denies SI, HI, and AVH. She is medication compliant and had no complaints during this shift. Will monitor with 15 minute safety checks.

## 2021-01-14 NOTE — [facility] Group Notes (Signed)
LCSW Group Therapy Note  01/14/2021 2:12 PM  Type of Therapy/Topic:  Group Therapy:  Emotion Regulation  Participation Level:  Active   Description of Group:   The purpose of this group is to assist patients in learning to regulate negative emotions and experience positive emotions. Patients will be guided to discuss ways in which they have been vulnerable to their negative emotions. These vulnerabilities will be juxtaposed with experiences of positive emotions or situations, and patients will be challenged to use positive emotions to combat negative ones. Special emphasis will be placed on coping with negative emotions in conflict situations, and patients will process healthy conflict resolution skills.  Therapeutic Goals: 1. Patient will identify two positive emotions or experiences to reflect on in order to balance out negative emotions 2. Patient will label two or more emotions that they find the most difficult to experience 3. Patient will demonstrate positive conflict resolution skills through discussion and/or role plays  Summary of Patient Progress: Patient was present for the entirety of the group. She spoke at length about her anger, frustration, and agitation. Although she was on topic, she did not appear open to suggestions directed to her specifically. However, if they were suggested to the wider group she appeared more receptive.   Therapeutic Modalities:   Cognitive Behavioral Therapy Feelings Identification Dialectical Behavioral Therapy  Chalmers Guest. Guerry Bruin, MSW, [place], [place] 01/14/2021 2:12 PM

## 2021-01-14 NOTE — Progress Notes (Signed)
Recreation Therapy Notes    Date: 01/14/2021  Time: 10:00 am   Location: Craft room   Behavioral response: Appropriate  Intervention Topic: Relaxation     Discussion/Intervention:  Group content today was focused on relaxation. The group defined relaxation and identified healthy ways to relax. Individuals expressed how much time they spend relaxing. Patients expressed how much their life would be if they did not make time for themselves to relax. The group stated ways they could improve their relaxation techniques in the future.  Individuals participated in the intervention "Time to Relax" where they had a chance to experience different relaxation techniques.  Clinical Observations/Feedback: Patient came to group late and was focused on what peers and staff had to say about relaxation. Individual was social with staff and peers while participating in the intervention. Ericka Marcellus LRT/CTRS           Rayen Dafoe 01/14/2021 12:21 PM
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
## 2021-01-15 ENCOUNTER — Other Ambulatory Visit: Payer: Self-pay

## 2021-01-15 DIAGNOSIS — F3113 Bipolar disorder, current episode manic without psychotic features, severe: Secondary | ICD-10-CM | POA: Diagnosis not present

## 2021-01-15 MED ORDER — OLANZAPINE 20 MG PO TABS
20.0000 mg | ORAL_TABLET | Freq: Every day | ORAL | 0 refills | Status: DC
  Filled 2021-01-15: qty 7, 7d supply, fill #0

## 2021-01-15 MED ORDER — DIVALPROEX SODIUM ER 500 MG PO TB24
1000.0000 mg | ORAL_TABLET | Freq: Every day | ORAL | 0 refills | Status: DC
  Filled 2021-01-15: qty 14, 7d supply, fill #0

## 2021-01-15 MED ORDER — DIVALPROEX SODIUM ER 500 MG PO TB24: 1000.0000 mg | ORAL_TABLET | Freq: Every day | ORAL | 1 refills | Status: DC

## 2021-01-15 MED ORDER — OLANZAPINE 20 MG PO TABS: 20.0000 mg | ORAL_TABLET | Freq: Every day | ORAL | 1 refills | Status: DC

## 2021-01-15 NOTE — [facility] Suicide Risk Assessment (Addendum)
[facility] Discharge Suicide Risk Assessment   Principal Problem: Bipolar affective disorder, current episode manic ([place]) Discharge Diagnoses: Principal Problem:   Bipolar affective disorder, current episode manic ([place]) Active Problems:   Gastroesophageal reflux disease without esophagitis   HTN (hypertension), benign   Total Time spent with patient: 35 minutes- 25 minutes face-to-face contact with patient, 10 minutes documentation, coordination of care, scripts   Musculoskeletal: Strength & Muscle Tone: within normal limits Gait & Station: normal Patient leans: N/A  Psychiatric Specialty Exam  Presentation  General Appearance: Fairly Groomed  Eye Contact:Good  Speech:Normal Rate  Speech Volume:Normal  Handedness:Right   Mood and Affect  Mood:Euthymic  Duration of Depression Symptoms: Greater than two weeks  Affect:Congruent   Thought Process  Thought Processes:Goal Directed  Descriptions of Associations:Intact  Orientation:Full (Time, Place and Person)  Thought Content:Logical  History of Schizophrenia/Schizoaffective disorder:No  Duration of Psychotic Symptoms:No data recorded Hallucinations:Hallucinations: None  Ideas of Reference:None  Suicidal Thoughts:Suicidal Thoughts: No  Homicidal Thoughts:Homicidal Thoughts: No   Sensorium  Memory:Immediate Good; Recent Good; Remote Good  Judgment:Intact  Insight:Present   Executive Functions  Concentration:Good  Attention Span:Good  [place] of Knowledge:Good  Language:Good   Psychomotor Activity  Psychomotor Activity:Psychomotor Activity: Normal   Assets  Assets:Communication Skills; Desire for Improvement; Housing; Intimacy; Physical Health; Resilience; Social Support; Talents/Skills; Transportation; Vocational/Educational   Sleep  Sleep:Sleep: Fair Number of Hours of Sleep: 6   Physical Exam: Physical Exam ROS Blood pressure 134/90, pulse 86, temperature 97.7 F (36.5  C), temperature source Oral, resp. rate 17, height 5' 4.5" (1.638 m), weight 68.5 kg, last menstrual period 09/20/2018, SpO2 98 %. Body mass index is 25.52 kg/m.  Mental Status Per Nursing Assessment::   On Admission:  NA  Demographic Factors:  Caucasian  Loss Factors: NA  Historical Factors: Impulsivity  Risk Reduction Factors:   Sense of responsibility to family, Religious beliefs about death, Employed, Living with another person, especially a relative, Positive social support, Positive therapeutic relationship and Positive coping skills or problem solving skills  Continued Clinical Symptoms:  Bipolar Disorder:   Mixed State Previous Psychiatric Diagnoses and Treatments  Cognitive Features That Contribute To Risk:  None    Suicide Risk:  Minimal: No identifiable suicidal ideation.  Patients presenting with no risk factors but with morbid ruminations; may be classified as minimal risk based on the severity of the depressive symptoms   Follow-up Information    [facility] Follow up.   Contact information: [place] [address] [phone]               Plan Of Care/Follow-up recommendations:  Activity:  as tolerated Diet:  low sodium heart healthy diet  [name], MD 01/15/2021, 10:48 AM

## 2021-01-15 NOTE — Progress Notes (Signed)
  [facility] Adult Case Management Discharge Plan :  Will you be returning to the same living situation after discharge:  Yes,  pt plans to return home. At discharge, do you have transportation home?: Yes,  CSW to arrange transportation home. Do you have the ability to pay for your medications: No.  Release of information consent forms completed and in the chart;  Patient's signature needed at discharge.  Patient to Follow up at:  Follow-up Information    [place]. Call.   Why: Contact [facility] to follow up with CST services. Thanks! Contact information: [place] [address] [phone]               Next level of care provider has access to [place] and Suicide Prevention discussed: Yes,  SPE completed with brother, Vonna Drafts.  Have you used any form of tobacco in the last 30 days? (Cigarettes, Smokeless Tobacco, Cigars, and/or Pipes): Yes  Has patient been referred to the Quitline?: Patient refused referral  Patient has been referred for addiction treatment: Pt. refused referral  Shirl Harris, LCSW 01/15/2021, 10:53 AM

## 2021-01-15 NOTE — Discharge Summary (Addendum)
Physician Discharge Summary Note  Patient:  Penny Chase is an 50 y.o., female MRN:  [id number] DOB:  Aug 07, 1971 Patient phone:  [phone] (home)  Patient address:   [place] [address],  Total Time spent with patient: 35 minutes- 25 minutes face-to-face contact with patient, 10 minutes documentation, coordination of care, scripts   Date of Admission:  01/06/2021 Date of Discharge: 01/15/2021  Reason for Admission: 50 year old female without psychiatric history presenting after manic episode.   Principal Problem: Bipolar affective disorder, current episode manic [facility]) Discharge Diagnoses: Principal Problem:   Bipolar affective disorder, current episode manic ([place]) Active Problems:   Gastroesophageal reflux disease without esophagitis   HTN (hypertension), benign   Past Psychiatric History: Patient was briefly on citalopram after treatment for her breast cancer.  She has no formal diagnosis of bipolar disorder or mood disorder in general.  No previous hospitalizations, no history of suicide attempts.  She does report a history of polysubstance abuse, but no formal treatment.  Past Medical History:  Past Medical History:  Diagnosis Date  . Breast cancer ([place])   . Cancer ([place]) 09/2018   Right breast  . COVID-19 11/2018  . Family history of breast cancer   . Hypertension   . Personal history of radiation therapy 2020   Rt breast    Past Surgical History:  Procedure Laterality Date  . BREAST BIOPSY Right 09/12/2018   [place] core/"venus clip"-INVASIVE MAMMARYLobular CARCINOMA, WITH LOBULAR FEATURES  . BREAST LUMPECTOMY Right 10/04/2018  . BREAST LUMPECTOMY WITH NEEDLE LOCALIZATION AND AXILLARY SENTINEL LYMPH NODE BX Right 10/04/2018   Procedure: BREAST LUMPECTOMY WITH NEEDLE LOCALIZATION AND AXILLARY SENTINEL LYMPH NODE BX;  Surgeon: Herbert Pun, MD;  Location: [facility] ORS;  Service: General;  Laterality: Right;  . TONSILLECTOMY     Family  History:  Family History  Problem Relation Age of Onset  . Breast cancer Paternal Aunt    Family Psychiatric  History: Denies Social History:  Social History   Substance and Sexual Activity  Alcohol Use Not Currently   Comment: not taken for about 6 weeks     Social History   Substance and Sexual Activity  Drug Use Yes  . Types: Marijuana   Comment: Uses street opioids    Social History   Socioeconomic History  . Marital status: Married    Spouse name: Not on file  . Number of children: Not on file  . Years of education: Not on file  . Highest education level: Not on file  Occupational History  . Not on file  Tobacco Use  . Smoking status: Current Every Day Smoker    Packs/day: 1.00    Years: 15.00    Pack years: 15.00    Types: Cigarettes  . Smokeless tobacco: Never Used  . Tobacco comment: patient declined  Vaping Use  . Vaping Use: Former  Substance and Sexual Activity  . Alcohol use: Not Currently    Comment: not taken for about 6 weeks  . Drug use: Yes    Types: Marijuana    Comment: Uses street opioids  . Sexual activity: Not on file  Other Topics Concern  . Not on file  Social History Narrative   Works at [employer];  Smoke 15 cig/day; No alcohol abuse; in [employer]; with husband.    Social Determinants of Health   Financial Resource Strain: Not on file  Food Insecurity: Not on file  Transportation Needs: Not on file  Physical Activity:  Not on file  Stress: Not on file  Social Connections: Not on file    Hospital Course:  50 year old female without psychiatric history presenting after manic episode. She was sleeping under an hour per night, hyperactive, euphoric, intrusive, and tangential on admission. She was started on Zyprexa and titrated to 20 mg QHS, and also started on Depakote and titrated to 1000 mg QHS with valproic acid level 54. On this regimen she began to sleep, and her thought process became more organized. She  denies SI/HI/AH/VH, and plans to follow-up with [facility] for continued outpatient treatment.   Physical Findings: AIMS: Facial and Oral Movements Muscles of Facial Expression: None, normal Lips and Perioral Area: None, normal Jaw: None, normal Tongue: None, normal,Extremity Movements Upper (arms, wrists, hands, fingers): None, normal Lower (legs, knees, ankles, toes): None, normal, Trunk Movements Neck, shoulders, hips: None, normal, Overall Severity Severity of abnormal movements (highest score from questions above): None, normal Incapacitation due to abnormal movements: None, normal Patient's awareness of abnormal movements (rate only patient's report): No Awareness, Dental Status Current problems with teeth and/or dentures?: No Does patient usually wear dentures?: No  CIWA:    COWS:     Musculoskeletal: Strength & Muscle Tone: within normal limits Gait & Station: normal Patient leans: N/A    Psychiatric Specialty Exam:  Presentation  General Appearance: Fairly Groomed  Eye Contact:Good  Speech:Normal Rate  Speech Volume:Normal  Handedness:Right   Mood and Affect  Mood:Euthymic  Affect:Congruent   Thought Process  Thought Processes:Goal Directed  Descriptions of Associations:Intact  Orientation:Full (Time, Place and Person)  Thought Content:Logical  History of Schizophrenia/Schizoaffective disorder:No  Duration of Psychotic Symptoms:No data recorded Hallucinations:Hallucinations: None  Ideas of Reference:None  Suicidal Thoughts:Suicidal Thoughts: No  Homicidal Thoughts:Homicidal Thoughts: No   Sensorium  Memory:Immediate Good; Recent Good; Remote Good  Judgment:Intact  Insight:Present   Executive Functions  Concentration:Good  Attention Span:Good  [place] of Knowledge:Good  Language:Good   Psychomotor Activity  Psychomotor Activity:Psychomotor Activity: Normal   Assets  Assets:Communication Skills; Desire for  Improvement; Housing; Intimacy; Physical Health; Resilience; Social Support; Talents/Skills; Transportation; Vocational/Educational   Sleep  Sleep:Sleep: Fair Number of Hours of Sleep: 6    Physical Exam: Physical Exam Vitals and nursing note reviewed.  Constitutional:      Appearance: Normal appearance.  HENT:     Head: Normocephalic and atraumatic.     Right Ear: External ear normal.     Left Ear: External ear normal.     Nose: Nose normal.     Mouth/Throat:     Mouth: Mucous membranes are moist.     Pharynx: Oropharynx is clear.  Eyes:     Extraocular Movements: Extraocular movements intact.     Conjunctiva/sclera: Conjunctivae normal.     Pupils: Pupils are equal, round, and reactive to light.  Cardiovascular:     Rate and Rhythm: Normal rate.     Pulses: Normal pulses.  Pulmonary:     Effort: Pulmonary effort is normal.     Breath sounds: Normal breath sounds.  Abdominal:     General: Abdomen is flat.     Palpations: Abdomen is soft.  Musculoskeletal:        General: No swelling. Normal range of motion.     Cervical back: Normal range of motion and neck supple.  Skin:    General: Skin is warm and dry.  Neurological:     General: No focal deficit present.     Mental Status: She is alert and  oriented to person, place, and time.  Psychiatric:        Mood and Affect: Mood normal.        Behavior: Behavior normal.        Thought Content: Thought content normal.        Judgment: Judgment normal.    Review of Systems  Constitutional: Negative.   HENT: Negative.   Eyes: Negative.   Respiratory: Negative.   Cardiovascular: Negative.   Gastrointestinal: Negative.   Genitourinary: Negative.   Musculoskeletal: Negative.   Skin: Negative.   Neurological: Negative.   Endo/Heme/Allergies: Positive for environmental allergies. Does not bruise/bleed easily.  Psychiatric/Behavioral: Negative for depression, hallucinations, memory loss, substance abuse and suicidal  ideas. The patient does not have insomnia.    Blood pressure 134/90, pulse 86, temperature 97.7 F (36.5 C), temperature source Oral, resp. rate 17, height 5' 4.5" (1.638 m), weight 68.5 kg, last menstrual period 09/20/2018, SpO2 98 %. Body mass index is 25.52 kg/m.   Have you used any form of tobacco in the last 30 days? (Cigarettes, Smokeless Tobacco, Cigars, and/or Pipes): Yes  Has this patient used any form of tobacco in the last 30 days? (Cigarettes, Smokeless Tobacco, Cigars, and/or Pipes)  Yes, A prescription for an FDA-approved tobacco cessation medication was offered at discharge and the patient refused  Blood Alcohol level:  Lab Results  Component Value Date   ETH <10 51/76/1607    Metabolic Disorder Labs:  Lab Results  Component Value Date   HGBA1C 5.3 01/07/2021   MPG 105 01/07/2021   No results found for: PROLACTIN Lab Results  Component Value Date   CHOL 224 (H) 01/07/2021   TRIG 85 01/07/2021   HDL 56 01/07/2021   CHOLHDL 4.0 01/07/2021   VLDL 17 01/07/2021   LDLCALC 151 (H) 01/07/2021    See Psychiatric Specialty Exam and Suicide Risk Assessment completed by Attending Physician prior to discharge.  Discharge destination:  Home  Is patient on multiple antipsychotic therapies at discharge:  No   Has Patient had three or more failed trials of antipsychotic monotherapy by history:  No  Recommended Plan for Multiple Antipsychotic Therapies: NA  Discharge Instructions    Diet - low sodium heart healthy   Complete by: As directed    Increase activity slowly   Complete by: As directed      Allergies as of 01/15/2021      Reactions   Amlodipine Swelling      Medication List    STOP taking these medications   amLODipine 5 MG tablet Commonly known as: NORVASC   aspirin-sod bicarb-citric acid 325 MG Tbef tablet Commonly known as: ALKA-SELTZER   celecoxib 100 MG capsule Commonly known as: CELEBREX   citalopram 20 MG tablet Commonly known as:  CELEXA   tamoxifen 20 MG tablet Commonly known as: NOLVADEX     TAKE these medications     Indication  acetaminophen 500 MG tablet Commonly known as: TYLENOL Take 500 mg by mouth every 6 (six) hours as needed for moderate pain.  Indication: Pain   divalproex 500 MG 24 hr tablet Commonly known as: DEPAKOTE ER Take 2 tablets (1,000 mg total) by mouth at bedtime.  Indication: Manic Phase of Manic-Depression   hydrochlorothiazide 25 MG tablet Commonly known as: HYDRODIURIL Take 1 tablet by mouth daily.  Indication: High Blood Pressure Disorder   OLANZapine 20 MG tablet Commonly known as: ZYPREXA Take 1 tablet (20 mg total) by mouth at bedtime.  Indication: Manic Phase  of Manic-Depression   pantoprazole 40 MG tablet Commonly known as: PROTONIX Take 40 mg by mouth daily as needed (heartburn).  Indication: Gastroesophageal Reflux Disease   Proventil HFA 108 (90 Base) MCG/ACT inhaler Generic drug: albuterol Inhale 2 puffs into the lungs every 4 (four) hours as needed.  Indication: Asthma       Follow-up Information    [facility] Follow up.   Contact information: [place] [address] [phone]               Follow-up recommendations:  Activity:  as tolerated Diet:  low sodium heart healthy diet  Comments:  7-day supply free medication provided along with printed 30-day scripts with 1 refill  Signed: [name], MD 01/15/2021, 10:48 AM

## 2021-01-15 NOTE — Plan of Care (Signed)
  Problem: Group Participation Goal: STG - Patient will focus on task/topic with 2 prompts from staff within 5 recreation therapy group sessions Description: STG - Patient will focus on task/topic with 2 prompts from staff within 5 recreation therapy group sessions Outcome: Completed/Met

## 2021-01-15 NOTE — Progress Notes (Signed)
Recreation Therapy Notes  INPATIENT RECREATION TR PLAN  Patient Details Name: JENICA COSTILOW MRN: [id number] DOB: [birth year] Today's Date: 01/15/2021  Rec Therapy Plan Is patient appropriate for Therapeutic Recreation?: Yes Treatment times per week: at least 3 Estimated Length of Stay: 5-7 days TR Treatment/Interventions: Group participation (Comment)  Discharge Criteria Pt will be discharged from therapy if:: Discharged Treatment plan/goals/alternatives discussed and agreed upon by:: Patient/family  Discharge Summary Short term goals set: Patient will focus on task/topic with 2 prompts from staff within 5 recreation therapy group sessions Short term goals met: Complete Progress toward goals comments: Groups attended Which groups?: Communication,Anger management,AAA/T,Other (Comment) (Relaxation, Creative expressions, Self-care) Reason goals not met: N/A Therapeutic equipment acquired: N/A Reason patient discharged from therapy: Discharge from hospital Pt/family agrees with progress & goals achieved: Yes Date patient discharged from therapy: 01/15/21   Michaeljoseph Revolorio 01/15/2021, 12:02 PM

## 2021-01-15 NOTE — Progress Notes (Signed)
Recreation Therapy Notes    Date: 01/15/2021  Time: 10:00 am   Location: Craft room   Behavioral response: Appropriate  Intervention Topic: Communication   Discussion/Intervention:  Group content today was focused on communication. The group defined communication and ways to communicate with others. Individuals stated reason why communication is important and some reasons to communicate with others. Patients expressed if they thought they were good at communicating with others and ways they could improve their communication skills. The group identified important parts of communication and some experiences they have had in the past with communication. The group participated in the intervention "What is that?", where they had a chance to test out their communication skills and identify ways to improve their communication techniques.  Clinical Observations/Feedback: Patient came to group defined communication as people working together to get to an end result. She stated that communication is important to decrease problems. Participant explained that she communicates with others by looking at others body language. Individual was social with staff and peers while participating in the intervention. Story Vanvranken LRT/CTRS          Davone Shinault 01/15/2021 11:43 AM

## 2021-01-15 NOTE — B[facility] Counselor (Signed)
CSW received a call from the patient's brother, Penny Chase.  Brother reports that the pt's neighbors have called him, reporting concerns that the patient was "outside around her house looking for [her husband]".  CSW informed that patient has been discharged from the unit.    Brother inquired if pt was sleeping and CSW reported that per the progression meeting reports the patient has been sleeping.  CSW was also asked about the pt's manic behaviors and reports that she has been short with others.  CSW reports that she is unaware of reports that pt has been "short" with others.  CSW further updated that manic behaviors had improved.   Penny Chase, MSW, LCSW 01/15/2021 3:39 PM

## 2021-01-15 NOTE — Progress Notes (Signed)
Patient presents in a better mood than at previous encounters. Less intrusive today and has not approached  the nurses station since start of the shift.   She denies SI/HI/AVH depression and anxiety at this encounter.  She is med complinat and received her meds without incident.  She is safe on the unit with 15 minute safety and encouraged to come to staff with any concerns.   Mardene Sayer, LPN

## 2021-01-15 NOTE — Progress Notes (Signed)
Pt was educated on prescriptions and follow up care. Pt questions were answered and pt verbalized understanding and did not voice any concerns. Pt's belongings were returned. Pt was safely discharged to [facility].

## 2021-01-15 NOTE — Progress Notes (Signed)
Patient approached the nurses station requesting Ibuprofen for pain that she rates at 7/10. Reports generalized pain and list several pain sites such as back and ankle. Mediation supplied and received without incident.   Cleo Butler-Nicholson, LPN

## 2021-01-15 NOTE — Progress Notes (Signed)
   01/15/21 1115  Clinical Encounter Type  Visited With Patient  Visit Type Initial;Spiritual support;Social support  Referral From Chaplain  Consult/Referral To [place] met with Penny Chase in hallway. Introduced herself as the Education officer, community. Chaplain Burris offered compassionate presence and established supportive relationship with patient.

## 2021-02-03 ENCOUNTER — Inpatient Hospital Stay: Payer: Self-pay | Attending: Internal Medicine | Admitting: Internal Medicine

## 2021-02-03 NOTE — Assessment & Plan Note (Deleted)
#  Stage IA ER PR positive HER-2/neu negative.;  Oncotype low risk - on Tamoxifen.  Stable mammogram Feb 2022- WNL.   # HOLD  Tamoxifen- ? Psychosis/mood swings [see below]  # Psychosis/mood swings- ? Tamoxifen; HOLD tamoxifen. Refer to psychiatry.  No concerns for any self-harm/or homicidal ideas.  # Myalgias/ fatigue-G-2; question tamoxifen versus low vitamin D-recommend vitamin D over-the-counter.   # DISPOSITION: # refer to psychiatry/Dr.Kapur ASAP re: mood swings.  # Follow up 1 month- MD; NO labs- Dr.B

## 2021-02-03 NOTE — Progress Notes (Deleted)
[place] NOTE  Patient Care Team: Kirk Ruths, MD as PCP - General (Internal Medicine) Chauncey Mann, MD as Consulting Physician (Psychiatry)  CHIEF COMPLAINTS/PURPOSE OF CONSULTATION:  Breast cancer  #  Oncology History Overview Note  # JAN 2020- INVASIVE LOBULAR CARCINOMA, WITH FEATURES. pT2 (49mm)psN0; (Dr.Cintron ) Stage IA; ER/PR >90%; her-2 neu-NEG.  #  RT finish May 30th; oncotype RS score- 11; 3% DR at 8 years; No adjuvant chemo  # June 1st 2020- Start Tam [pre-menopausal]  # HTN/ Covid positive [April 0051]  #S/p genetic counseling-declined testing [low risk/no children]   DIAGNOSIS: Right breast cancer  STAGE:     1    ;GOALS: Cure  CURRENT/MOST RECENT THERAPY: on TAM      Carcinoma of upper-outer quadrant of right breast in female, estrogen receptor positive ([place])  09/20/2018 Initial Diagnosis   Carcinoma of upper-outer quadrant of right breast in female, estrogen receptor positive ([place])      HISTORY OF PRESENTING ILLNESS:  Penny Chase 50 y.o.  female  Stage I ER/PR pos her 2 Neg Breast cancer on tamoxifen is here for a follow up.  Patient complains of difficulty sleeping.  Complains of anxiety.  Complains of mood swings.  She complains of crying spells for no reason.  Denies any plan for self-harm/or harming others.  Complaints of significant joint pain/muscle pain.   Review of Systems  Constitutional:  Negative for chills, diaphoresis, fever, malaise/fatigue and weight loss.  HENT:  Negative for nosebleeds and sore throat.   Eyes:  Negative for double vision.  Respiratory:  Negative for cough, hemoptysis, sputum production, shortness of breath and wheezing.   Cardiovascular:  Negative for chest pain, palpitations, orthopnea and leg swelling.  Gastrointestinal:  Negative for abdominal pain, blood in stool, constipation, diarrhea, heartburn, melena, nausea and vomiting.  Musculoskeletal:  Positive for back pain, joint  pain and myalgias.  Skin: Negative.  Negative for itching and rash.  Neurological:  Negative for dizziness, tingling, focal weakness, weakness and headaches.  Endo/Heme/Allergies:  Does not bruise/bleed easily.  Psychiatric/Behavioral:  Positive for depression. The patient is nervous/anxious and has insomnia.      MEDICAL HISTORY:  Past Medical History:  Diagnosis Date  . Breast cancer ([place])   . Cancer ([place]) 09/2018   Right breast  . COVID-19 11/2018  . Family history of breast cancer   . Hypertension   . Personal history of radiation therapy 2020   Rt breast    SURGICAL HISTORY: Past Surgical History:  Procedure Laterality Date  . BREAST BIOPSY Right 09/12/2018   [place] core/"venus clip"-INVASIVE MAMMARYLobular CARCINOMA, WITH LOBULAR FEATURES  . BREAST LUMPECTOMY Right 10/04/2018  . BREAST LUMPECTOMY WITH NEEDLE LOCALIZATION AND AXILLARY SENTINEL LYMPH NODE BX Right 10/04/2018   Procedure: BREAST LUMPECTOMY WITH NEEDLE LOCALIZATION AND AXILLARY SENTINEL LYMPH NODE BX;  Surgeon: Herbert Pun, MD;  Location: [facility] ORS;  Service: General;  Laterality: Right;  . TONSILLECTOMY      SOCIAL HISTORY: Social History   Socioeconomic History  . Marital status: Married    Spouse name: Not on file  . Number of children: Not on file  . Years of education: Not on file  . Highest education level: Not on file  Occupational History  . Not on file  Tobacco Use  . Smoking status: Every Day    Packs/day: 1.00    Years: 15.00    Pack years: 15.00    Types: Cigarettes  . Smokeless tobacco:  Never  . Tobacco comments:    patient declined  Vaping Use  . Vaping Use: Former  Substance and Sexual Activity  . Alcohol use: Not Currently    Comment: not taken for about 6 weeks  . Drug use: Yes    Types: Marijuana    Comment: Uses street opioids  . Sexual activity: Not on file  Other Topics Concern  . Not on file  Social History Narrative   Works at [employer];  Smoke  15 cig/day; No alcohol abuse; in [employer]; with husband.    Social Determinants of Health   Financial Resource Strain: Not on file  Food Insecurity: Not on file  Transportation Needs: Not on file  Physical Activity: Not on file  Stress: Not on file  Social Connections: Not on file  Intimate Partner Violence: Not on file    FAMILY HISTORY: Family History  Problem Relation Age of Onset  . Breast cancer Paternal Aunt     ALLERGIES:  is allergic to amlodipine.  MEDICATIONS:  Current Outpatient Medications  Medication Sig Dispense Refill  . acetaminophen (TYLENOL) 500 MG tablet Take 500 mg by mouth every 6 (six) hours as needed for moderate pain.    . divalproex (DEPAKOTE ER) 500 MG 24 hr tablet Take 2 tablets (1,000 mg total) by mouth at bedtime. 14 tablet 0  . hydrochlorothiazide (HYDRODIURIL) 25 MG tablet Take 1 tablet by mouth daily.    Marland Kitchen OLANZapine (ZYPREXA) 20 MG tablet Take 1 tablet (20 mg total) by mouth at bedtime. 7 tablet 0  . pantoprazole (PROTONIX) 40 MG tablet Take 40 mg by mouth daily as needed (heartburn).     Marland Kitchen PROVENTIL HFA 108 (90 Base) MCG/ACT inhaler Inhale 2 puffs into the lungs every 4 (four) hours as needed.     No current facility-administered medications for this visit.      Marland Kitchen  PHYSICAL EXAMINATION: ECOG PERFORMANCE STATUS: 0 - Asymptomatic  There were no vitals filed for this visit.  There were no vitals filed for this visit.   Physical Exam Constitutional:      Comments: Alone.  Ambulating independently.  HENT:     Head: Normocephalic and atraumatic.     Mouth/Throat:     Pharynx: No oropharyngeal exudate.  Eyes:     Pupils: Pupils are equal, round, and reactive to light.  Cardiovascular:     Rate and Rhythm: Normal rate and regular rhythm.  Pulmonary:     Effort: Pulmonary effort is normal. No respiratory distress.     Breath sounds: Normal breath sounds. No wheezing.  Abdominal:     General: Bowel sounds are normal. There is  no distension.     Palpations: Abdomen is soft. There is no mass.     Tenderness: There is no abdominal tenderness. There is no guarding or rebound.  Musculoskeletal:        General: No tenderness. Normal range of motion.     Cervical back: Normal range of motion and neck supple.  Skin:    General: Skin is warm.  Neurological:     Mental Status: She is alert and oriented to person, place, and time.  Psychiatric:        Mood and Affect: Affect normal.     Comments: Anxious/tearful     LABORATORY DATA:  I have reviewed the data as listed Lab Results  Component Value Date   WBC 14.3 (H) 01/01/2021   HGB 14.8 01/01/2021   HCT 42.9 01/01/2021  MCV 93.9 01/01/2021   PLT 292 01/01/2021   Recent Labs    10/31/20 1519 01/01/21 1510  NA 139 136  K 3.9 2.9*  CL 106 100  CO2 24 25  GLUCOSE 97 101*  BUN 17 11  CREATININE 0.80 0.65  CALCIUM 8.9 9.0  GFRNONAA >60 >60  PROT 7.7 7.5  ALBUMIN 4.4 4.2  AST 17 17  ALT 12 17  ALKPHOS 73 70  BILITOT 0.5 0.6     RADIOGRAPHIC STUDIES: I have personally reviewed the radiological images as listed and agreed with the findings in the report. No results found.  ASSESSMENT & PLAN:   No problem-specific Assessment & Plan notes found for this encounter.  All questions were answered. The patient knows to call the clinic with any problems, questions or concerns.    Cammie Sickle, MD 02/03/2021 9:03 AM

## 2022-04-28 ENCOUNTER — Other Ambulatory Visit: Payer: Self-pay | Admitting: Family Medicine

## 2022-04-28 DIAGNOSIS — Z1231 Encounter for screening mammogram for malignant neoplasm of breast: Secondary | ICD-10-CM

## 2022-05-19 ENCOUNTER — Other Ambulatory Visit: Payer: Self-pay | Admitting: Family Medicine

## 2022-05-19 DIAGNOSIS — N939 Abnormal uterine and vaginal bleeding, unspecified: Secondary | ICD-10-CM

## 2022-05-19 DIAGNOSIS — N912 Amenorrhea, unspecified: Secondary | ICD-10-CM

## 2022-05-27 ENCOUNTER — Ambulatory Visit
Admission: RE | Admit: 2022-05-27 | Discharge: 2022-05-27 | Disposition: A | Discharge status: Home / Self Care | Payer: 59 | Source: Ambulatory Visit | Attending: Family Medicine | Admitting: Family Medicine

## 2022-05-27 DIAGNOSIS — N939 Abnormal uterine and vaginal bleeding, unspecified: Secondary | ICD-10-CM | POA: Insufficient documentation

## 2022-05-27 DIAGNOSIS — N912 Amenorrhea, unspecified: Secondary | ICD-10-CM | POA: Insufficient documentation

## 2022-07-20 ENCOUNTER — Encounter: Payer: Self-pay | Admitting: Obstetrics & Gynecology

## 2022-07-20 ENCOUNTER — Other Ambulatory Visit (HOSPITAL_COMMUNITY)
Admission: RE | Admit: 2022-07-20 | Discharge: 2022-07-20 | Disposition: A | Discharge status: Home / Self Care | Payer: 59 | Source: Ambulatory Visit | Attending: Obstetrics & Gynecology | Admitting: Obstetrics & Gynecology

## 2022-07-20 ENCOUNTER — Ambulatory Visit (INDEPENDENT_AMBULATORY_CARE_PROVIDER_SITE_OTHER): Payer: 59 | Admitting: Obstetrics & Gynecology

## 2022-07-20 VITALS — BP 149/96 | HR 84 | Wt 193.0 lb

## 2022-07-20 DIAGNOSIS — N950 Postmenopausal bleeding: Secondary | ICD-10-CM

## 2022-07-20 DIAGNOSIS — R9389 Abnormal findings on diagnostic imaging of other specified body structures: Secondary | ICD-10-CM | POA: Diagnosis not present

## 2022-07-20 DIAGNOSIS — N898 Other specified noninflammatory disorders of vagina: Secondary | ICD-10-CM | POA: Diagnosis not present

## 2022-07-20 MED ORDER — FLUCONAZOLE 150 MG PO TABS: 150.0000 mg | ORAL_TABLET | Freq: Once | ORAL | 0 refills | Status: AC

## 2022-07-20 NOTE — Addendum Note (Signed)
Addended by: Emily Filbert on: 07/20/2022 04:14 PM   Modules accepted: Orders

## 2022-07-20 NOTE — Progress Notes (Addendum)
   Established Patient Office Visit  Subjective   Patient ID: Penny Chase, female    DOB: 06-06-1971  Age: 51 y.o. MRN: [id number]  No chief complaint on file.   HPI    51 yo married G0 here with the issue of a 1 year h/o heavy PMB. She had right breast cancer surgery in 09/2018 followed by RT. She had her last normal period 09/2018. She has never used contraception and has never gotten pregnant, no workup was ever done. She had a pelvic ultrasound done 05/27/2022 that showed a 10 mm endometrium, otherwise normal. Objective:     BP (!) 149/96   Pulse 84   Wt 193 lb (87.5 kg)   LMP 09/20/2018 (Exact Date)   BMI 32.62 kg/m    Physical Exam   Well nourished, well hydrated White female, no apparent distress She is ambulating and conversing normally.   UPT negative, consent signed, time out done Spec used to visualize cervix. Curdy white discharge c/w yeast noted. Cervix prepped with betadine and sprayed with Hurricaine spray. I then grasped with a single tooth tenaculum. Uterus sounded to 8 cm Pipelle used for 2 passes with tissue obtained. She tolerated the procedure well.    Assessment & Plan:  PMB with thickened endometrium Vaginal discharge- Aptima sent Problem List Items Addressed This Visit   None  I will call her next Monday with results   Emily Filbert, MD

## 2022-07-20 NOTE — Addendum Note (Signed)
Addended by: Inis Sizer on: 07/20/2022 03:53 PM   Modules accepted: Orders

## 2022-07-21 ENCOUNTER — Encounter: Payer: Self-pay | Admitting: Obstetrics & Gynecology

## 2022-07-22 LAB — CERVICOVAGINAL ANCILLARY ONLY
Bacterial Vaginitis (gardnerella): NEGATIVE
Candida Glabrata: NEGATIVE
Candida Vaginitis: NEGATIVE
Chlamydia: NEGATIVE
Comment: NEGATIVE
Comment: NEGATIVE
Comment: NEGATIVE
Comment: NEGATIVE
Comment: NEGATIVE
Comment: NORMAL
Neisseria Gonorrhea: NEGATIVE
Trichomonas: NEGATIVE

## 2022-07-22 LAB — SURGICAL PATHOLOGY

## 2022-08-03 ENCOUNTER — Telehealth: Payer: Self-pay | Admitting: Obstetrics & Gynecology

## 2022-08-03 NOTE — Telephone Encounter (Signed)
I left her a message that her pathology was fine.

## 2022-08-12 ENCOUNTER — Telehealth: Payer: Self-pay

## 2022-08-12 NOTE — Telephone Encounter (Signed)
Penny Chase called triage asking is there anything else she needs to do about the biopsy done?  Also, stated she is still having the discharge and bad odor.

## 2022-10-15 ENCOUNTER — Ambulatory Visit
Admission: RE | Admit: 2022-10-15 | Discharge: 2022-10-15 | Disposition: A | Discharge status: Home / Self Care | Payer: 59 | Source: Ambulatory Visit | Attending: Family Medicine | Admitting: Family Medicine

## 2022-10-15 DIAGNOSIS — Z1231 Encounter for screening mammogram for malignant neoplasm of breast: Secondary | ICD-10-CM | POA: Diagnosis present

## 2022-10-22 ENCOUNTER — Inpatient Hospital Stay: Admission: RE | Admit: 2022-10-22 | Payer: 59 | Source: Ambulatory Visit

## 2022-11-08 ENCOUNTER — Ambulatory Visit: Admission: EM | Admit: 2022-11-08 | Discharge: 2022-11-08 | Discharge status: Against Medical Advice | Payer: 59

## 2022-11-08 ENCOUNTER — Other Ambulatory Visit: Payer: Self-pay

## 2023-01-12 IMAGING — CR DG OS CALCIS 2+V*L*
2 series · 2 of 2 positions shown · non-contrast
Comparison: None.

CLINICAL DATA: Left foot pain.  No reported injury.

EXAM:
LEFT OS CALCIS - 2+ VIEW

[calcaneus axial]
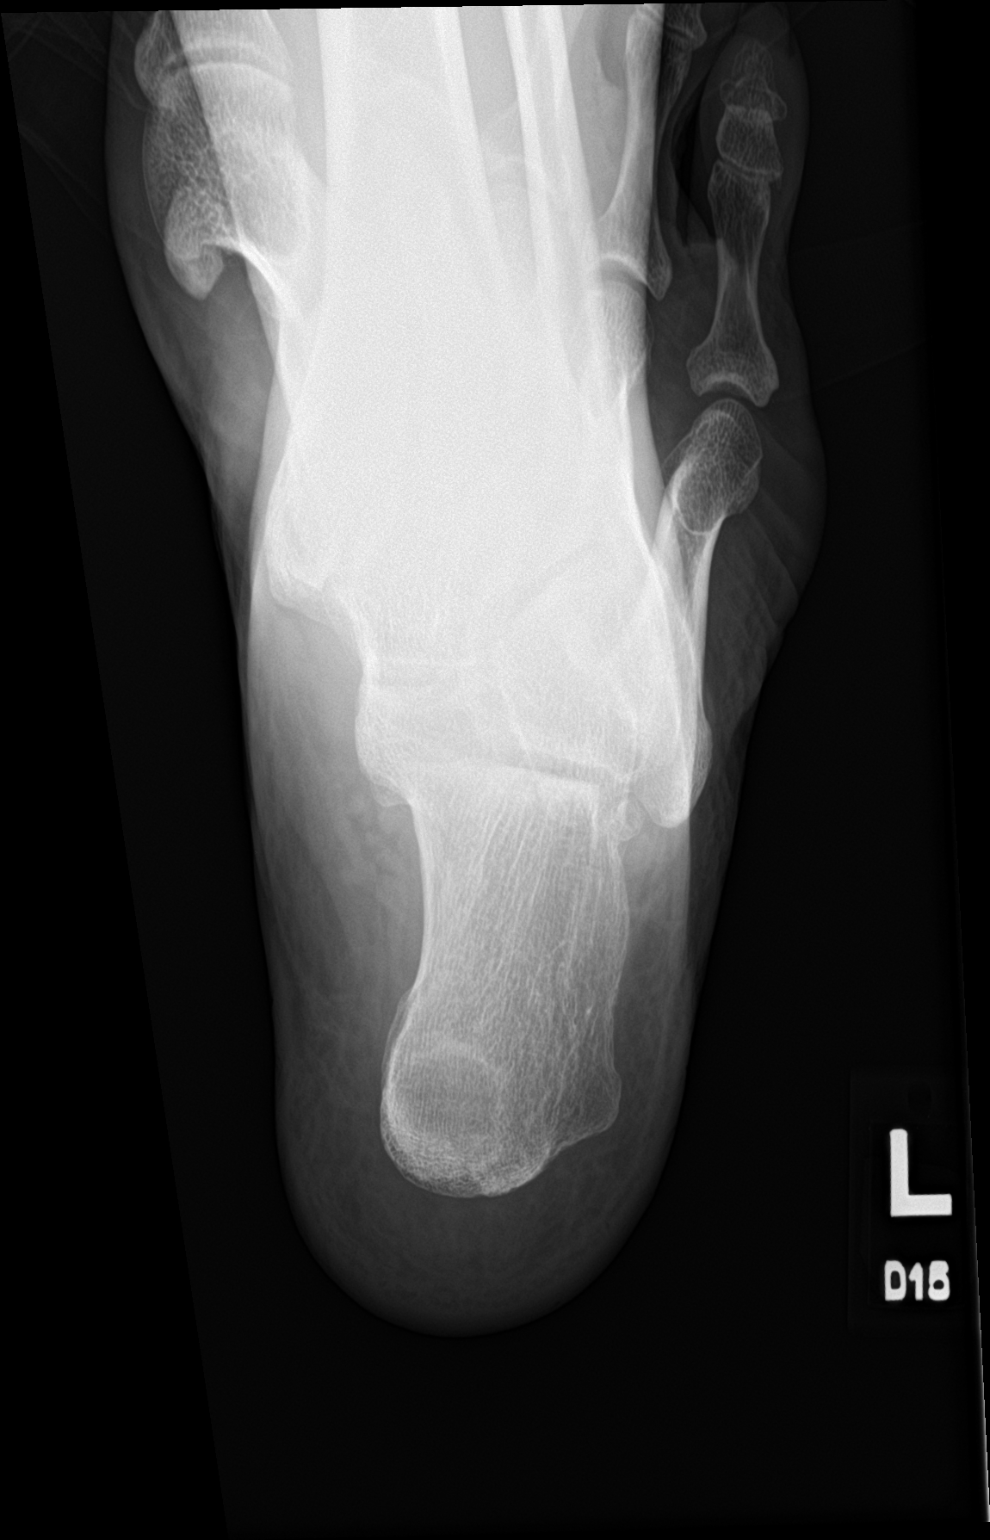

[calcaneus lat]
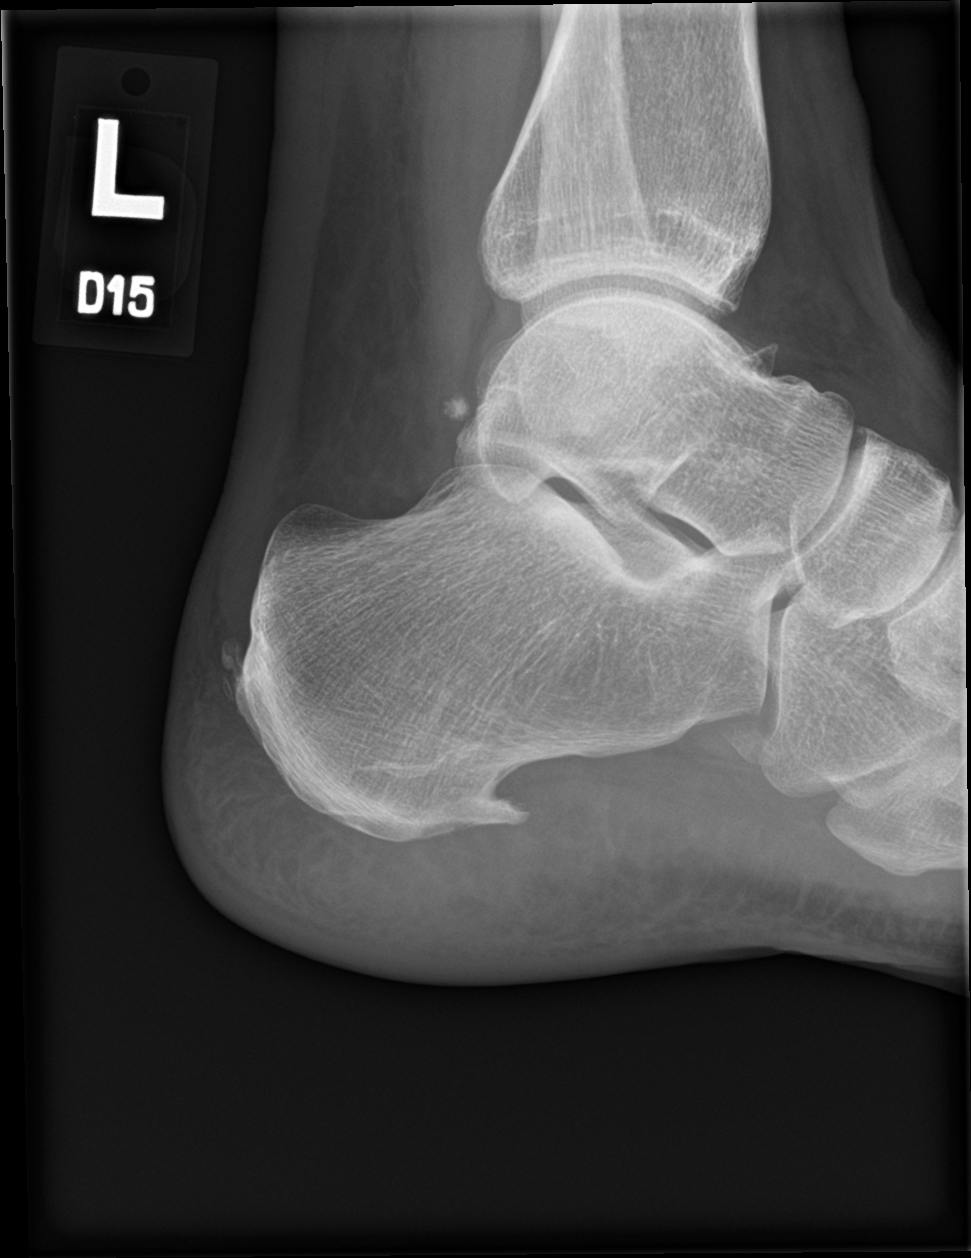

[2 of 2 positions shown; findings below may reference images not displayed]

FINDINGS: No fracture or focal osseous lesions. Small Achilles and plantar
left calcaneal spurs. No radiopaque foreign body.
IMPRESSION: Small Achilles and plantar left calcaneal spurs. No acute osseous
abnormality.

## 2023-09-28 ENCOUNTER — Other Ambulatory Visit: Payer: Self-pay | Admitting: Family Medicine

## 2023-09-28 DIAGNOSIS — Z1231 Encounter for screening mammogram for malignant neoplasm of breast: Secondary | ICD-10-CM

## 2023-11-23 ENCOUNTER — Inpatient Hospital Stay
Admission: EM | Admit: 2023-11-23 | Discharge: 2023-11-30 | DRG: 239 | Disposition: A | Discharge status: Home Health | Attending: Internal Medicine | Admitting: Internal Medicine

## 2023-11-23 ENCOUNTER — Emergency Department

## 2023-11-23 ENCOUNTER — Observation Stay

## 2023-11-23 ENCOUNTER — Other Ambulatory Visit: Payer: Self-pay

## 2023-11-23 DIAGNOSIS — I96 Gangrene, not elsewhere classified: Secondary | ICD-10-CM

## 2023-11-23 DIAGNOSIS — K921 Melena: Secondary | ICD-10-CM

## 2023-11-23 DIAGNOSIS — I70262 Atherosclerosis of native arteries of extremities with gangrene, left leg: Principal | ICD-10-CM | POA: Diagnosis present

## 2023-11-23 DIAGNOSIS — Z79899 Other long term (current) drug therapy: Secondary | ICD-10-CM | POA: Diagnosis not present

## 2023-11-23 DIAGNOSIS — K269 Duodenal ulcer, unspecified as acute or chronic, without hemorrhage or perforation: Secondary | ICD-10-CM | POA: Diagnosis present

## 2023-11-23 DIAGNOSIS — Z8701 Personal history of pneumonia (recurrent): Secondary | ICD-10-CM

## 2023-11-23 DIAGNOSIS — K219 Gastro-esophageal reflux disease without esophagitis: Secondary | ICD-10-CM | POA: Diagnosis present

## 2023-11-23 DIAGNOSIS — F17210 Nicotine dependence, cigarettes, uncomplicated: Secondary | ICD-10-CM | POA: Diagnosis present

## 2023-11-23 DIAGNOSIS — D638 Anemia in other chronic diseases classified elsewhere: Secondary | ICD-10-CM | POA: Diagnosis present

## 2023-11-23 DIAGNOSIS — Z86711 Personal history of pulmonary embolism: Secondary | ICD-10-CM

## 2023-11-23 DIAGNOSIS — Z7982 Long term (current) use of aspirin: Secondary | ICD-10-CM

## 2023-11-23 DIAGNOSIS — Z9889 Other specified postprocedural states: Secondary | ICD-10-CM

## 2023-11-23 DIAGNOSIS — Z86718 Personal history of other venous thrombosis and embolism: Secondary | ICD-10-CM

## 2023-11-23 DIAGNOSIS — K2971 Gastritis, unspecified, with bleeding: Secondary | ICD-10-CM | POA: Diagnosis present

## 2023-11-23 DIAGNOSIS — S90822A Blister (nonthermal), left foot, initial encounter: Principal | ICD-10-CM

## 2023-11-23 DIAGNOSIS — Z8616 Personal history of COVID-19: Secondary | ICD-10-CM

## 2023-11-23 DIAGNOSIS — I2699 Other pulmonary embolism without acute cor pulmonale: Secondary | ICD-10-CM

## 2023-11-23 DIAGNOSIS — I998 Other disorder of circulatory system: Secondary | ICD-10-CM | POA: Diagnosis present

## 2023-11-23 DIAGNOSIS — K5733 Diverticulitis of large intestine without perforation or abscess with bleeding: Secondary | ICD-10-CM | POA: Diagnosis present

## 2023-11-23 DIAGNOSIS — D62 Acute posthemorrhagic anemia: Secondary | ICD-10-CM

## 2023-11-23 DIAGNOSIS — Z803 Family history of malignant neoplasm of breast: Secondary | ICD-10-CM

## 2023-11-23 DIAGNOSIS — K922 Gastrointestinal hemorrhage, unspecified: Secondary | ICD-10-CM

## 2023-11-23 DIAGNOSIS — E538 Deficiency of other specified B group vitamins: Secondary | ICD-10-CM | POA: Diagnosis present

## 2023-11-23 DIAGNOSIS — K559 Vascular disorder of intestine, unspecified: Secondary | ICD-10-CM

## 2023-11-23 DIAGNOSIS — K633 Ulcer of intestine: Secondary | ICD-10-CM | POA: Diagnosis present

## 2023-11-23 DIAGNOSIS — I10 Essential (primary) hypertension: Secondary | ICD-10-CM | POA: Diagnosis present

## 2023-11-23 DIAGNOSIS — Z923 Personal history of irradiation: Secondary | ICD-10-CM

## 2023-11-23 DIAGNOSIS — K259 Gastric ulcer, unspecified as acute or chronic, without hemorrhage or perforation: Secondary | ICD-10-CM

## 2023-11-23 DIAGNOSIS — Z888 Allergy status to other drugs, medicaments and biological substances status: Secondary | ICD-10-CM

## 2023-11-23 DIAGNOSIS — F1910 Other psychoactive substance abuse, uncomplicated: Secondary | ICD-10-CM | POA: Insufficient documentation

## 2023-11-23 DIAGNOSIS — X58XXXA Exposure to other specified factors, initial encounter: Secondary | ICD-10-CM | POA: Diagnosis present

## 2023-11-23 DIAGNOSIS — D72829 Elevated white blood cell count, unspecified: Secondary | ICD-10-CM

## 2023-11-23 DIAGNOSIS — F1210 Cannabis abuse, uncomplicated: Secondary | ICD-10-CM | POA: Diagnosis present

## 2023-11-23 DIAGNOSIS — Z720 Tobacco use: Secondary | ICD-10-CM | POA: Diagnosis not present

## 2023-11-23 DIAGNOSIS — K2980 Duodenitis without bleeding: Secondary | ICD-10-CM | POA: Diagnosis present

## 2023-11-23 DIAGNOSIS — D75839 Thrombocytosis, unspecified: Secondary | ICD-10-CM | POA: Diagnosis present

## 2023-11-23 DIAGNOSIS — K5730 Diverticulosis of large intestine without perforation or abscess without bleeding: Secondary | ICD-10-CM | POA: Diagnosis present

## 2023-11-23 DIAGNOSIS — Z7901 Long term (current) use of anticoagulants: Secondary | ICD-10-CM

## 2023-11-23 DIAGNOSIS — Z853 Personal history of malignant neoplasm of breast: Secondary | ICD-10-CM

## 2023-11-23 DIAGNOSIS — D509 Iron deficiency anemia, unspecified: Secondary | ICD-10-CM | POA: Diagnosis present

## 2023-11-23 DIAGNOSIS — F319 Bipolar disorder, unspecified: Secondary | ICD-10-CM | POA: Diagnosis present

## 2023-11-23 DIAGNOSIS — K625 Hemorrhage of anus and rectum: Secondary | ICD-10-CM

## 2023-11-23 DIAGNOSIS — I2693 Single subsegmental pulmonary embolism without acute cor pulmonale: Secondary | ICD-10-CM | POA: Diagnosis present

## 2023-11-23 DIAGNOSIS — M79672 Pain in left foot: Secondary | ICD-10-CM | POA: Diagnosis not present

## 2023-11-23 LAB — IRON AND TIBC
Iron: 41 ug/dL (ref 28–170)
Saturation Ratios: 17 % (ref 10.4–31.8)
TIBC: 246 ug/dL — ABNORMAL LOW (ref 250–450)
UIBC: 205 ug/dL

## 2023-11-23 LAB — CBC WITH DIFFERENTIAL/PLATELET
Abs Immature Granulocytes: 1.2 10*3/uL — ABNORMAL HIGH (ref 0.00–0.07)
Basophils Absolute: 0.4 10*3/uL — ABNORMAL HIGH (ref 0.0–0.1)
Basophils Relative: 1 %
Eosinophils Absolute: 0.6 10*3/uL — ABNORMAL HIGH (ref 0.0–0.5)
Eosinophils Relative: 2 %
HCT: 35.2 % — ABNORMAL LOW (ref 36.0–46.0)
Hemoglobin: 11.4 g/dL — ABNORMAL LOW (ref 12.0–15.0)
Immature Granulocytes: 4 %
Lymphocytes Relative: 10 %
Lymphs Abs: 2.8 10*3/uL (ref 0.7–4.0)
MCH: 31.7 pg (ref 26.0–34.0)
MCHC: 32.4 g/dL (ref 30.0–36.0)
MCV: 97.8 fL (ref 80.0–100.0)
Monocytes Absolute: 1.8 10*3/uL — ABNORMAL HIGH (ref 0.1–1.0)
Monocytes Relative: 6 %
Neutro Abs: 21.1 10*3/uL — ABNORMAL HIGH (ref 1.7–7.7)
Neutrophils Relative %: 77 %
Platelets: 885 10*3/uL — ABNORMAL HIGH (ref 150–400)
RBC: 3.6 MIL/uL — ABNORMAL LOW (ref 3.87–5.11)
RDW: 15.7 % — ABNORMAL HIGH (ref 11.5–15.5)
Smear Review: NORMAL
WBC: 28 10*3/uL — ABNORMAL HIGH (ref 4.0–10.5)
nRBC: 0 % (ref 0.0–0.2)

## 2023-11-23 LAB — GASTROINTESTINAL PANEL BY PCR, STOOL (REPLACES STOOL CULTURE)

## 2023-11-23 LAB — PROTIME-INR
INR: 1.1 (ref 0.8–1.2)
Prothrombin Time: 14.8 s (ref 11.4–15.2)

## 2023-11-23 LAB — COMPREHENSIVE METABOLIC PANEL WITH GFR
ALT: 33 U/L (ref 0–44)
AST: 38 U/L (ref 15–41)
Albumin: 2.9 g/dL — ABNORMAL LOW (ref 3.5–5.0)
Alkaline Phosphatase: 111 U/L (ref 38–126)
Anion gap: 10 (ref 5–15)
BUN: 14 mg/dL (ref 6–20)
CO2: 23 mmol/L (ref 22–32)
Calcium: 8.7 mg/dL — ABNORMAL LOW (ref 8.9–10.3)
Chloride: 102 mmol/L (ref 98–111)
Creatinine, Ser: 0.78 mg/dL (ref 0.44–1.00)
GFR, Estimated: >60 mL/min (ref >60)
Glucose, Bld: 112 mg/dL — ABNORMAL HIGH (ref 70–99)
Potassium: 3.9 mmol/L (ref 3.5–5.1)
Sodium: 135 mmol/L (ref 135–145)
Total Bilirubin: 0.3 mg/dL (ref 0.0–1.2)
Total Protein: 7.6 g/dL (ref 6.5–8.1)

## 2023-11-23 LAB — HEMOGLOBIN AND HEMATOCRIT, BLOOD
HCT: 33.5 % — ABNORMAL LOW (ref 36.0–46.0)
HCT: 30.8 % — ABNORMAL LOW (ref 36.0–46.0)
Hemoglobin: 11.2 g/dL — ABNORMAL LOW (ref 12.0–15.0)
Hemoglobin: 10.3 g/dL — ABNORMAL LOW (ref 12.0–15.0)

## 2023-11-23 LAB — RETICULOCYTES
Immature Retic Fract: 22.1 % — ABNORMAL HIGH (ref 2.3–15.9)
RBC.: 3.22 MIL/uL — ABNORMAL LOW (ref 3.87–5.11)
Retic Count, Absolute: 168.1 10*3/uL (ref 19.0–186.0)
Retic Ct Pct: 5.2 % — ABNORMAL HIGH (ref 0.4–3.1)

## 2023-11-23 LAB — FERRITIN: Ferritin: 419 ng/mL — ABNORMAL HIGH (ref 11–307)

## 2023-11-23 LAB — LACTIC ACID, PLASMA
Lactic Acid, Venous: 0.8 mmol/L (ref 0.5–1.9)
Lactic Acid, Venous: 1.4 mmol/L (ref 0.5–1.9)

## 2023-11-23 LAB — APTT: aPTT: 30 s (ref 24–36)

## 2023-11-23 LAB — FOLATE: Folate: 6.5 ng/mL (ref >5.9)

## 2023-11-23 LAB — HIV ANTIBODY (ROUTINE TESTING W REFLEX): HIV Screen 4th Generation wRfx: NONREACTIVE

## 2023-11-23 MED ORDER — ACETAMINOPHEN 500 MG PO TABS
1000.0000 mg | ORAL_TABLET | Freq: Once | ORAL | Status: AC
  Administered 2023-11-23: 1000 mg via ORAL
  Filled 2023-11-23: qty 2

## 2023-11-23 MED ORDER — IOHEXOL 350 MG/ML SOLN
100.0000 mL | Freq: Once | INTRAVENOUS | Status: AC | PRN
  Administered 2023-11-23: 100 mL via INTRAVENOUS

## 2023-11-23 MED ORDER — ONDANSETRON HCL 4 MG PO TABS
4.0000 mg | ORAL_TABLET | Freq: Four times a day (QID) | ORAL | Status: DC | PRN
  Filled 2023-11-23: qty 1

## 2023-11-23 MED ORDER — SODIUM CHLORIDE 0.9 % IV SOLN: INTRAVENOUS | Status: AC

## 2023-11-23 MED ORDER — MORPHINE SULFATE (PF) 4 MG/ML IV SOLN
4.0000 mg | Freq: Once | INTRAVENOUS | Status: AC
  Administered 2023-11-23: 4 mg via INTRAVENOUS
  Filled 2023-11-23: qty 1

## 2023-11-23 MED ORDER — HEPARIN BOLUS VIA INFUSION
2000.0000 [IU] | Freq: Once | INTRAVENOUS | Status: AC
  Administered 2023-11-23: 2000 [IU] via INTRAVENOUS
  Filled 2023-11-23: qty 2000

## 2023-11-23 MED ORDER — PANTOPRAZOLE SODIUM 40 MG IV SOLR
40.0000 mg | Freq: Once | INTRAVENOUS | Status: AC
  Administered 2023-11-23: 40 mg via INTRAVENOUS
  Filled 2023-11-23: qty 10

## 2023-11-23 MED ORDER — HEPARIN (PORCINE) 25000 UT/250ML-% IV SOLN
1400.0000 [IU]/h | INTRAVENOUS | Status: DC
  Administered 2023-11-23: 800 [IU]/h via INTRAVENOUS
  Filled 2023-11-23: qty 250

## 2023-11-23 MED ORDER — NICOTINE 21 MG/24HR TD PT24
21.0000 mg | MEDICATED_PATCH | Freq: Every day | TRANSDERMAL | Status: DC
  Administered 2023-11-23 – 2023-11-30 (×8): 21 mg via TRANSDERMAL
  Filled 2023-11-23 (×8): qty 1

## 2023-11-23 MED ORDER — MORPHINE SULFATE (PF) 4 MG/ML IV SOLN
4.0000 mg | INTRAVENOUS | Status: DC | PRN
  Administered 2023-11-23 – 2023-11-24 (×6): 4 mg via INTRAVENOUS
  Filled 2023-11-23 (×7): qty 1

## 2023-11-23 MED ORDER — ONDANSETRON HCL 4 MG/2ML IJ SOLN: 4.0000 mg | Freq: Four times a day (QID) | INTRAMUSCULAR | Status: DC | PRN

## 2023-11-23 NOTE — Assessment & Plan Note (Signed)
 Pt self admits to MJ use Will check UDS to correlate  Follow

## 2023-11-23 NOTE — ED Notes (Signed)
 Pt in gown and able to ambulate to toilet in room. Stool sample given and sent with pt label.

## 2023-11-23 NOTE — Assessment & Plan Note (Signed)
 Marked white count of 28 on presentation Suspect multifactorial with reactive component given multiple recent vascular procedures as well as suspected diverticulitis on CT scan Does not appear to be in overt sepsis On Zosyn and vancomycin for GI as well as lower extremity coverage Will otherwise monitor with treatment Follow closely

## 2023-11-23 NOTE — Assessment & Plan Note (Signed)
 Noted bloody bowel movements x 12 to 24 hours in the setting of anticoagulation use CT imaging with noted diverticulitis IV Zosyn Hold offending agent for now Hemoglobin 11.4 Trend Transfuse for hemoglobin less than 7 Monitor GI consultation as appropriate

## 2023-11-23 NOTE — Assessment & Plan Note (Addendum)
 Worsening left lower extremity distal foot redness and discoloration concerning for limb ischemia and dry gangrene Noted recent admission in the [facility] system for similar issues with patient refusing amputation. Procedures included iliac thrombus in addition to thrombus in the AT and PT. S/p L CFA/AT/PT cutdown, L AT/PT thromboembolectomy 11/14/23-patient refused amputation CTA runoff pending Dr. Gilda Crease with vascular surgery consulted Dr. Annamary Rummage with podiatry also consulted Follow-up recommendations Will empirically cover with Zosyn and vancomycin for soft tissue coverage given leukocytosis and overlapping diverticulitis Follow closely

## 2023-11-23 NOTE — Consult Note (Signed)
 PHARMACY - ANTICOAGULATION CONSULT NOTE  Pharmacy Consult for Heparin infusion Indication: PE and low extremity ischemia  Allergies  Allergen Reactions   Amlodipine Swelling   Patient Measurements: Height: 5\' 4"  (162.6 cm) Weight: 81.6 kg (180 lb) IBW/kg (Calculated) : 54.7 HEPARIN DW (KG): 72.4  Vital Signs: Temp: 98.9 F (37.2 C) (04/09 1533) Temp Source: Oral (04/09 1533) BP: 116/79 (04/09 1533) Pulse Rate: 81 (04/09 1533)  Labs: Recent Labs    11/23/23 0949 11/23/23 1441  HGB 11.4* 11.2*  HCT 35.2* 33.5*  PLT 885*  --   APTT 30  --   LABPROT 14.8  --   INR 1.1  --   CREATININE 0.78  --    Estimated Creatinine Clearance: 83.1 mL/min (by C-G formula based on SCr of 0.78 mg/dL).  Medical History: Past Medical History:  Diagnosis Date   Breast cancer (HCC)    Cancer (HCC) 09/2018   Right breast   COVID-19 11/2018   Family history of breast cancer    Hypertension    Personal history of radiation therapy 2020   Rt breast    Medications:  Apixaban 5mg  po BID - last dose 4/9 @ 0730 per med hx  Assessment: 53 yo fe,ale patient with PMH of hypertension, GERD, bipolar disorder, breast cancer, recent admission to [facility] for PE and arterial occlusion of left lower extremity. Present to ED with foot pain and blood in stools.  Pharmacy consulted to initiate heparin infusion per vascular provider.  Per Dr Gilda Crease: give 1/2 the calculated bolus and OK to follow nomogram thereafter.  Noted last apixaban given > 12 hours ago. aPTT 30, INR 1.1, hgb 11.4 Diverticulitis found on CT.  Goal of Therapy:  Heparin level 0.3-0.7 units/ml aPTT 66-102 seconds Monitor platelets by anticoagulation protocol: Yes   Plan:  Give 2000 units bolus x 1 Start heparin infusion at 800 units/hr Check aPTT level in 6 hours and daily while on heparin Will continue to follow aPTT until correlating with HL Continue to monitor H&H and platelets  Seif Teichert Rodriguez-Guzman PharmD, BCPS 11/23/2023  7:58 PM

## 2023-11-23 NOTE — Assessment & Plan Note (Addendum)
 BP stable Titrate home regimen

## 2023-11-23 NOTE — ED Triage Notes (Signed)
 First Nurse Note: Patient to ED from [facility] for left foot problem- toes blistered and black. PT c/o weakness and unable to use knee scooter anymore. Recently dc'd from [facility] after a DVT- had multiple surgeries for removal.

## 2023-11-23 NOTE — Consult Note (Signed)
 PODIATRY CONSULTATION  NAME Penny Chase MRN [id number] DOB 01-Dec-1970 DOA 11/23/2023   Reason for consult: Gangrene of left foot  Attending/Consulting physician: S. Newton MD  History of present illness: 53 y.o. female with past medical history significant for peripheral arterial disease and blood clot recently discharged from [facility] proximately 2 days ago.  Had procedure there to improve blood flow to her left lower extremity.  She says that since then she has had worsening pain in her left foot also noted some blisters developing.  While at [facility] she was recommended to have a below the knee amputation and she refused this.  Was told that if any blisters developed or she had any drainage to go to the emergency department.  She also notes she had a bloody bowel movement recently.  She does report pain in the ankle and foot on the left lower extremity but says she cannot feel the toes at this point.  Past Medical History:  Diagnosis Date   Breast cancer (HCC)    Cancer (HCC) 09/2018   Right breast   COVID-19 11/2018   Family history of breast cancer    Hypertension    Personal history of radiation therapy 2020   Rt breast       Latest Ref Rng & Units 11/23/2023    9:49 AM 01/01/2021    3:10 PM 10/31/2020    3:19 PM  CBC  WBC 4.0 - 10.5 K/uL 28.0  14.3  11.1   Hemoglobin 12.0 - 15.0 g/dL 08.6  57.8  46.9   Hematocrit 36.0 - 46.0 % 35.2  42.9  42.9   Platelets 150 - 400 K/uL 885  292  270        Latest Ref Rng & Units 11/23/2023    9:49 AM 01/01/2021    3:10 PM 10/31/2020    3:19 PM  BMP  Glucose 70 - 99 mg/dL 629  528  97   BUN 6 - 20 mg/dL 14  11  17    Creatinine 0.44 - 1.00 mg/dL 4.13  2.44  0.10   Sodium 135 - 145 mmol/L 135  136  139   Potassium 3.5 - 5.1 mmol/L 3.9  2.9  3.9   Chloride 98 - 111 mmol/L 102  100  106   CO2 22 - 32 mmol/L 23  25  24    Calcium 8.9 - 10.3 mg/dL 8.7  9.0  8.9       Physical Exam: Lower Extremity Exam Left foot with dry gangrenous changes to  the plantar aspect of the 1st through 5th digit and dorsal midfoot.  Left forefoot is very cold to the touch.  Nonpalpable DP and PT pulse.  Incisions without evidence of infection on the left leg.  Tender to palpation in the left foot and ankle near the zone of demarcation.  No malodor no active drainage noted.  Gangrene areas are dry       ASSESSMENT/PLAN OF CARE 53 y.o. female with PMHx significant for  PAD with dry gangrenous changes of the left foot to the midtarsal joint level.  No significant evidence of rapidly spreading or acute infection in the left foot   WBC 28 XR Foot L: No evidence of osteomyelitis or soft tissue emphysema  -Evidence of dry gangrenous changes and actively demarcating zone of gangrene to the left proximal midfoot.  Do not currently recommend any surgical intervention would allow for continued demarcation unless infection becomes of significant concern in the left  foot. -Discussed with patient that the current demarcation level would place her at a very proximal level of midfoot amputation that may not be salvageable.  She may ultimately need proximal limb amputation as was recommended at [facility]. -Vascular consulted, appreciate recs - Antibiotics per primary currently I do not see a need for them in regards to the left foot - Anticoagulation: Per primary/vascular - Wound care: Leave left foot uncovered open to air and.  Wear a sock if moving around. No Betadine or dressing needed at this time. - WB status: Weightbearing as tolerated bilateral lower extremity - Will continue to follow   Thank you for the consult.  Please contact me directly with any questions or concerns.           Corinna Gab, DPM [facility]    [address] [place], [place] [address]                Office [phone]  Fax [phone]

## 2023-11-23 NOTE — H&P (Addendum)
 History and Physical    Patient: Penny Chase ZOX:[id number] DOB: 1970/10/14 DOA: 11/23/2023 DOS: the patient was seen and examined on 11/23/2023 PCP: [facility]  Patient coming from: Home  Chief Complaint:  Chief Complaint  Patient presents with   Foot Pain   Blood In Stools   HPI: Penny Chase is a 53 y.o. female with medical history significant of PE, hypertension, tobacco abuse, substance abuse, history of right breast cancer presenting with left lower extremity ischemia, GI bleeding, diverticulitis.  Patient reports worsening left foot pain redness and swelling over the past 1 to 2 days.  Noted to have been recently evaluated in the [facility] system March 29 through April 7 with issues including multiple subsegmental PEs, lower extremity arterial occlusion status post thrombectomy.  Please see discharge summary for details.  Patient noted had extensive evaluation including iliac thrombus in addition to thrombus in the AT and PT. S/p L CFA/AT/PT cutdown, L AT/PT thromboembolectomy 11/14/23.  Also had multiple subsegmental pulmonary emboli without cor pulmonale.  Some issues of lower extremity pain and swelling recurred with patient refusing amputation.  Was discharged on Eliquis and aspirin.  Patient reports compliance to medication.  No recent trauma.  Still smoking 1 pack/day.  No fevers or chills.  No abdominal pain.  No nausea or vomiting.  Has had severe worsening right distal lower extremity pain and swelling.  No reported purulent drainage.  No reported trauma.  Patient also reports bloody bowel movements times the past 12 hours.  No frank abdominal pain or diarrhea.  Has had blood-tinged stools.  Denies any prior episodes like this in the past. Presented to the ER afebrile, hemodynamically stable.  Satting well on room air.  White count 28, hemoglobin 11.4, platelets 885, creatinine 0.78.  Lactate within normal is.  CT angio runoff with aortic iliac disease, bilateral common  iliac arterial disease with mixed calcified plaques with 50% narrowing of the right CIA, positive subsegmental pulmonary emboli on the right lung base, bilateral renal artery disease, fluid filled colon concern for colitis versus early diverticulitis.  Mixed tree-in-bud and centrilobular nodularities.  Dr. Tomma Lightning with vascular surgery Dr. Annamary Rummage with podiatry formally consulted. Review of Systems: As mentioned in the history of present illness. All other systems reviewed and are negative. Past Medical History:  Diagnosis Date   Breast cancer (HCC)    Cancer (HCC) 09/2018   Right breast   COVID-19 11/2018   Family history of breast cancer    Hypertension    Personal history of radiation therapy 2020   Rt breast   Past Surgical History:  Procedure Laterality Date   BREAST BIOPSY Right 09/12/2018   [place] core/"venus clip"-INVASIVE MAMMARYLobular CARCINOMA, WITH LOBULAR FEATURES   BREAST LUMPECTOMY Right 10/04/2018   BREAST LUMPECTOMY WITH NEEDLE LOCALIZATION AND AXILLARY SENTINEL LYMPH NODE BX Right 10/04/2018   Procedure: BREAST LUMPECTOMY WITH NEEDLE LOCALIZATION AND AXILLARY SENTINEL LYMPH NODE BX;  Surgeon: Carolan Shiver, MD;  Location: [facility] ORS;  Service: General;  Laterality: Right;   TONSILLECTOMY     Social History:  reports that she has been smoking cigarettes. She has a 15 pack-year smoking history. She has never used smokeless tobacco. She reports that she does not currently use alcohol. She reports that she does not currently use drugs after having used the following drugs: Marijuana.  Allergies  Allergen Reactions   Amlodipine Swelling    Family History  Problem Relation Age of Onset   Breast cancer Paternal Aunt  Prior to Admission medications   Medication Sig Start Date End Date Taking? Authorizing Provider  apixaban (ELIQUIS) 5 MG TABS tablet Take 5 mg by mouth 2 (two) times daily. 11/21/23 11/20/24 Yes [provider]  aspirin 81 MG chewable tablet  Chew 81 mg by mouth daily. 11/22/23 12/22/23 Yes [provider]  citalopram (CELEXA) 20 MG tablet Take 20 mg by mouth daily.   Yes [provider]  gabapentin (NEURONTIN) 600 MG tablet Take 600 mg by mouth 3 (three) times daily.   Yes [provider]  hydrochlorothiazide (HYDRODIURIL) 12.5 MG tablet Take 12.5 mg by mouth daily.   Yes [provider]  pravastatin (PRAVACHOL) 10 MG tablet Take 1 tablet by mouth daily.   Yes [provider]  acetaminophen (TYLENOL) 500 MG tablet Take 500 mg by mouth every 6 (six) hours as needed for moderate pain. Patient not taking: Reported on 07/20/2022    [provider]  celecoxib (CELEBREX) 100 MG capsule Take 100 mg by mouth 2 (two) times daily. Patient not taking: Reported on 11/23/2023 11/13/23   [provider]  Docusate Sodium (DSS) 100 MG CAPS Take 100 mg by mouth 2 (two) times daily. Patient not taking: Reported on 11/23/2023 11/21/23 12/01/23  [provider]  oxyCODONE (OXY IR/ROXICODONE) 5 MG immediate release tablet Take 5 mg by mouth every 6 (six) hours as needed for moderate pain (pain score 4-6). Patient not taking: Reported on 11/23/2023 11/21/23 11/26/23  [provider]  polyethylene glycol (MIRALAX / GLYCOLAX) 17 g packet Take 17 g by mouth 3 (three) times daily with meals. Patient not taking: Reported on 11/23/2023 11/21/23 12/01/23  [provider]  senna (SENOKOT) 8.6 MG tablet Take 1 tablet by mouth at bedtime. Patient not taking: Reported on 11/23/2023 11/21/23 11/28/23  [provider]  tiZANidine (ZANAFLEX) 2 MG tablet Take 2 mg by mouth 2 (two) times daily. Patient not taking: Reported on 11/23/2023 09/27/23   [provider]    Physical Exam: Vitals:   11/23/23 1140 11/23/23 1150 11/23/23 1200 11/23/23 1247  BP:    120/69  Pulse: 72 72 69 77  Resp: 14 16 14    Temp:    98.1 F (36.7 C)  TempSrc:    Oral  SpO2: 97% 98% 98% 98%  Weight:       Height:       Physical Exam Constitutional:      Appearance: She is normal weight.  HENT:     Head: Normocephalic and atraumatic.     Nose: Nose normal.     Mouth/Throat:     Mouth: Mucous membranes are moist.  Eyes:     Pupils: Pupils are equal, round, and reactive to light.  Cardiovascular:     Rate and Rhythm: Normal rate and regular rhythm.  Pulmonary:     Effort: Pulmonary effort is normal.  Abdominal:     General: Bowel sounds are normal.  Musculoskeletal:        General: Normal range of motion.  Skin:    Comments: See picture  Neurological:     General: No focal deficit present.  Psychiatric:        Mood and Affect: Mood normal.     Data Reviewed:  There are no new results to review at this time.  CT Angio Aortobifemoral W and/or Wo Contrast CLINICAL DATA:  53 year old female with a history of Recen arterial thrombus left lower extremity, now with blisters to left foot. Recently started  anticoagulation, now with bloody diarrhea.  EXAM: CT ANGIOGRAPHY OF ABDOMINAL AORTA WITH ILIOFEMORAL RUNOFF  TECHNIQUE: Multidetector CT imaging of the abdomen, pelvis and lower extremities was performed using the standard protocol during bolus administration of intravenous contrast. Multiplanar CT image reconstructions and MIPs were obtained to evaluate the vascular anatomy.  RADIATION DOSE REDUCTION: This exam was performed according to the departmental dose-optimization program which includes automated exposure control, adjustment of the mA and/or kV according to patient size and/or use of iterative reconstruction technique.  CONTRAST:  OMNIPAQUE IOHEXOL 350 MG/ML SOLN  COMPARISON:  None Available.  FINDINGS: VASCULAR  VASCULAR  Aorta: Unremarkable course, caliber, contour of the abdominal aorta. No dissection, aneurysm, or periaortic fluid. Moderate mixed calcified and atheromatous plaque without pedunculated plaque.  Celiac: Patent, with no  significant atherosclerotic changes.  SMA: Patent, with no significant atherosclerotic changes.  Renals:  - Right: Single right renal artery. Mixed calcified and soft plaque at the origin with estimated 50% or less narrowing.  - Left: Single left renal artery. Mixed calcified and soft plaque at the origin without significant narrowing.  IMA: Inferior mesenteric artery is patent.  Right lower extremity:  Mixed atheromatous and calcified plaque at the origin of the common iliac artery, with estimated 50% narrowing. Calcified plaque through the remainder of the CIA without narrowing.  Hypogastric artery is patent with patency of the pelvic arteries.  External iliac artery patent without significant atherosclerotic changes.  Minimal atherosclerosis of the common femoral artery.  Profunda femoris and the thigh branches are patent.  SFA is patent without significant atherosclerotic changes. Popliteal artery patent.  Typical trifurcation with patency of the proximal anterior tibial artery, the tibioperoneal trunk, proximal peroneal artery and proximal posterior tibial artery.  The distal tibial arteries of the right lower extremity are limited in evaluation given the timing of the contrast bolus. Despite this, at least anterior tibial artery and posterior tibial artery appear patent to the ankle.  Left lower extremity:  Mixed atheromatous and calcified plaque at the origin the left common iliac artery. No high-grade stenosis. Calcifications through the length of the common iliac artery which is patent.  Hypogastric artery patent.  Pelvic arteries patent.  External iliac artery patent without significant atherosclerotic changes.  No significant atherosclerotic changes of the common femoral artery.  Profunda femoris and the thigh branches patent.  Superficial femoral artery is patent throughout its length without significant atherosclerotic changes.  Popliteal artery  patent.  Anterior tibial artery origin patent.  Tibioperoneal trunk patent.  The distal tibial arteries of the left lower extremity are limited given the timing of the contrast bolus.  The CTA is nondiagnostic for patency of the tibial arteries in the mid and distal lower leg.  Veins: Unremarkable appearance of the unopacified venous system.  Review of the MIP images confirms the above findings.  NON-VASCULAR  Lower chest: Mix tree-in-bud and centrilobular nodularity in the bilateral lung bases. No comparison. No pleural effusion. No significant bronchial wall thickening or endobronchial debris.  Hepatobiliary: Focal fatty infiltration adjacent to the falciform ligament. Unremarkable gall bladder.  Pancreas: Unremarkable.  Spleen: Unremarkable.  Adrenals/Urinary Tract:  - Right adrenal gland: Unremarkable  - Left adrenal gland: Unremarkable.  - Right kidney: No hydronephrosis, nephrolithiasis, inflammation, or ureteral dilation. No focal lesion.  - Left Kidney: No hydronephrosis, nephrolithiasis, inflammation, or ureteral dilation. No focal lesion.  - Urinary Bladder: Unremarkable.  Stomach/Bowel:  - Stomach: Unremarkable.  - Small bowel: Unremarkable  - Appendix: Normal.  - Colon:  The colon is fluid-filled from the hepatic flexure through the transverse colon, splenic flexure, descending colon and sigmoid colon. Diverticula present within the sigmoid colon with some associated wall thickening and mild local inflammation of the fascia/mesentery.  Lymphatic: No adenopathy.  Mesenteric: No free fluid or air. No mesenteric adenopathy.  Reproductive: Unremarkable appearance of the pelvic organs.  Other: Surgical changes of the left inguinal region with some fatty stranding and small focal fluid. No evidence arterial pseudoaneurysm. No evidence of hematoma or abscess. Surgical staples present within the superficial soft tissues.  Musculoskeletal: No  evidence of acute fracture. No bony canal narrowing. No significant degenerative changes of the hips.  IMPRESSION: CT angiogram demonstrates predominantly aorto iliac disease, including:  -moderate irregular aortic atherosclerosis, with mixed atheromatous and calcified plaque. Aortic Atherosclerosis (ICD10-I70.0).  -bilateral common iliac arterial disease with mixed calcified and atheromatous plaque. There is estimated 50% or less narrowing at the origin of the right CIA, with no significant narrowing of the left CIA.  The CT is nondiagnostic for evaluation of the left-sided tibial arteries, and patency cannot be confirmed below the popliteal artery and trifurcation.  Incidental subsegmental pulmonary embolism of the right lung base.  Bilateral renal arterial disease, without high-grade stenosis.  Fluid-filled colon, can be seen with nonspecific enteritis/colitis and correlation with symptoms may be useful. There is questionable early inflammatory changes in the wall of the sigmoid colon, at the site of the most pronounced diverticular disease, potentially representing early diverticulitis.  Mixed tree-in-bud and centrilobular nodularity of the lung bases. This may be inflammatory/infectious or alternatively could be related to the patient's known diagnosis of PE/VTE.  Additional ancillary findings as above.  Signed,  Yvone Neu. Miachel Roux, RPVI  [facility]  [facility]  Electronically Signed   By: Gilmer Mor D.O.   On: 11/23/2023 13:20 DG Foot Complete Left CLINICAL DATA:  Recent arterial occlusion of left lower extremity. Complicated by apparent gangrene. New cluster development. Evaluate for underlying gas. Toes blistered and black.  EXAM: LEFT FOOT - COMPLETE 3+ VIEW  COMPARISON:  Left calcaneus radiographs 12/01/2020  FINDINGS: There is a moderate plantar calcaneal heel spur, unchanged.  Minimal chronic enthesopathic change at the Achilles insertion on the calcaneus. Mild distal anterior tibial plafond and and adjacent talar dome-neck junction degenerative osteophytosis. 4 mm ossicle posterior to the talus on lateral view is unchanged from prior and chronic. Additional adjacent 2 mm ossicle not definitely previously seen.  Mild posterior dorsal navicular degenerative spurring. Well corticated ossicle measuring up to 8 mm just distal to the fibula.  No acute fracture dislocation.  IMPRESSION: 1. Moderate plantar calcaneal heel spur, unchanged. 2. Mild distal anterior tibial plafond and adjacent talar dome-neck junction degenerative osteophytosis. 3. Well corticated, chronic ossicle measuring up to 8 mm just distal to the fibula, likely the sequela of remote trauma.  Electronically Signed   By: Neita Garnet M.D.   On: 11/23/2023 10:59  Lab Results  Component Value Date   WBC 28.0 (H) 11/23/2023   HGB 11.4 (L) 11/23/2023   HCT 35.2 (L) 11/23/2023   MCV 97.8 11/23/2023   PLT 885 (H) 11/23/2023   Last metabolic panel Lab Results  Component Value Date   GLUCOSE 112 (H) 11/23/2023   NA 135 11/23/2023   K 3.9 11/23/2023   CL 102 11/23/2023   CO2 23 11/23/2023   BUN 14 11/23/2023   CREATININE 0.78 11/23/2023   GFRNONAA >60 11/23/2023   CALCIUM 8.7 (L)  11/23/2023   PROT 7.6 11/23/2023   ALBUMIN 2.9 (L) 11/23/2023   BILITOT 0.3 11/23/2023   ALKPHOS 111 11/23/2023   AST 38 11/23/2023   ALT 33 11/23/2023   ANIONGAP 10 11/23/2023    Assessment and Plan: * Limb ischemia Worsening left lower extremity distal foot redness and discoloration concerning for limb ischemia and dry gangrene Noted recent admission in the [facility] system for similar issues with patient refusing amputation. Procedures included iliac thrombus in addition to thrombus in the AT and PT. S/p L CFA/AT/PT cutdown, L AT/PT thromboembolectomy 11/14/23-patient refused amputation CTA runoff  pending Dr. Gilda Crease with vascular surgery consulted Dr. Annamary Rummage with podiatry also consulted Follow-up recommendations Will empirically cover with Zosyn and vancomycin for soft tissue coverage given leukocytosis and overlapping diverticulitis Follow closely  PE (pulmonary thromboembolism) (HCC) Noted recent diagnosis in the [facility] system with PE CT runoff today with nonspecific findings including Mixed tree-in-bud and centrilobular nodularity of the lung bases concerning for sequela secondary to recent pulmonary thromboembolism Holding anticoagulation for now in setting of GI bleeding as well as pending vascular evaluation No chest pain, hypoxia or tachycardia present Monitor  Leukocytosis Marked white count of 28 on presentation Suspect multifactorial with reactive component given multiple recent vascular procedures as well as suspected diverticulitis on CT scan Does not appear to be in overt sepsis On Zosyn and vancomycin for GI as well as lower extremity coverage Will otherwise monitor with treatment Follow closely  GI bleeding Noted bloody bowel movements x 12 to 24 hours in the setting of anticoagulation use CT imaging with noted diverticulitis IV Zosyn Hold offending agent for now Hemoglobin 11.4 Trend Transfuse for hemoglobin less than 7 Monitor GI consultation as appropriate  Substance abuse (HCC) Pt self admits to MJ use Will check UDS to correlate  Follow   Tobacco abuse 1 pack/day smoker Discussed cessation at length Nicotine patch  HTN (hypertension), benign BP stable Titrate home regimen  Gastroesophageal reflux disease without esophagitis PPI      Advance Care Planning:   Code Status: Full Code   Consults: Vascular surgery, Podiatry   Family Communication: No family at the bedside   Severity of Illness: The appropriate patient status for this patient is OBSERVATION. Observation status is judged to be reasonable and necessary in order to  provide the required intensity of service to ensure the patient's safety. The patient's presenting symptoms, physical exam findings, and initial radiographic and laboratory data in the context of their medical condition is felt to place them at decreased risk for further clinical deterioration. Furthermore, it is anticipated that the patient will be medically stable for discharge from the hospital within 2 midnights of admission.   Author: Floydene Flock, MD 11/23/2023 2:29 PM  For on call review [email or web address].

## 2023-11-23 NOTE — ED Triage Notes (Signed)
 Pt sent from [facility] for blisters to L foot and black discoloration. Pt also reports she had bright red blood in her stools overnight. She takes eliquis for recent blood clots where she was admitted at [facility]. Pt endorses increased pain in L foot and nausea.

## 2023-11-23 NOTE — Assessment & Plan Note (Signed)
 PPI ?

## 2023-11-23 NOTE — ED Provider Notes (Signed)
 [facility] Provider Note    Event Date/Time   First MD Initiated Contact with Patient 11/23/23 [phone]     (approximate)   History   Foot Pain and Blood In Stools  First Nurse Note: Patient to ED from [facility] for left foot problem- toes blistered and black. PT c/o weakness and unable to use knee scooter anymore. Recently dc'd from [facility] after a DVT- had multiple surgeries for removal.   Pt sent from [facility] for blisters to L foot and black discoloration. Pt also reports she had bright red blood in her stools overnight. She takes eliquis for recent blood clots where she was admitted at [facility]. Pt endorses increased pain in L foot and nausea.    HPI Penny Chase is a 53 y.o. female hypertension, GERD, bipolar disorder, breast cancer, recent admission to [facility] for PE and arterial occlusion of left lower extremity (see summary below) presents for evaluation of foot pain, blood in stools -Patient tells me that yesterday evening she noticed development of some blisters on the dorsum of her left foot, no increased pain, no increased redness, no fevers, chills -No new chest pain or shortness of breath -Did have 1 episode of bloody stool yesterday, no abdominal pain -Is taking her blood thinner as prescribed.  I reviewed patient's recent [facility] discharge summary from 11/12/2023-11/21/23 after admission to vascular surgery for arterial occlusion of her left lower extremity, multiple subsegmental pulmonary emboli complicated by hypoxic respiratory failure.  Inpatient course with acute onset of left foot pain and coolness, found to have thrombus in iliac as well as AT and PT, now status post left CFA/AT/PT cutdown and thrombectomy.  Discharge summary states no sign of infection of her foot and foot x-ray on day of discharge negative for gas.  Did recommend amputation of left lower extremity though patient declined.  Discharged on Eliquis and aspirin.  Was also treated for pneumonia while hospitalized,  no further antibiotics.       Physical Exam   Triage Vital Signs: ED Triage Vitals [11/23/23 0902]  Encounter Vitals Group     BP 133/83     Systolic BP Percentile      Diastolic BP Percentile      Pulse Rate 90     Resp 16     Temp 98.3 F (36.8 C)     Temp src      SpO2 96 %     Weight 180 lb (81.6 kg)     Height 5\' 4"  (1.626 m)     Head Circumference      Peak Flow      Pain Score 8     Pain Loc      Pain Education      Exclude from Growth Chart     Most recent vital signs: Vitals:   11/23/23 1247 11/23/23 1533  BP: 120/69 116/79  Pulse: 77 81  Resp:  15  Temp: 98.1 F (36.7 C) 98.9 F (37.2 C)  SpO2: 98% 96%     General: Awake, no distress.  CV:  Good peripheral perfusion. RRR, RP 2+ Resp:  Normal effort. CTAB Abd:  No distention. Nontender to deep palpation throughout RECTAL:  Patient declined.  Had bowel movement in emergency department that was watery diarrhea with moderate blood.  Not frank blood. LLE:   Warm to touch with no necrotic tissue appreciated over plantar aspect of all toes.  Small blisters appreciated throughout dorsum of foot.  Some discoloration of  tissue (purpleish) extending up through mid dorsum of foot.  No proximal streaking of discoloration.  No crepitus appreciated.  Unable to palpate pedal pulse, will try Doppler --> unable to get Doppler pulse bilaterally.   ED Results / Procedures / Treatments   Labs (all labs ordered are listed, but only abnormal results are displayed) Labs Reviewed  CBC WITH DIFFERENTIAL/PLATELET - Abnormal; Notable for the following components:      Result Value   WBC 28.0 (*)    RBC 3.60 (*)    Hemoglobin 11.4 (*)    HCT 35.2 (*)    RDW 15.7 (*)    Platelets 885 (*)    Neutro Abs 21.1 (*)    Monocytes Absolute 1.8 (*)    Eosinophils Absolute 0.6 (*)    Basophils Absolute 0.4 (*)    Abs Immature Granulocytes 1.20 (*)    All other components within normal limits  COMPREHENSIVE METABOLIC PANEL WITH  GFR - Abnormal; Notable for the following components:   Glucose, Bld 112 (*)    Calcium 8.7 (*)    Albumin 2.9 (*)    All other components within normal limits  HEMOGLOBIN AND HEMATOCRIT, BLOOD - Abnormal; Notable for the following components:   Hemoglobin 11.2 (*)    HCT 33.5 (*)    All other components within normal limits  GASTROINTESTINAL PANEL BY PCR, STOOL (REPLACES STOOL CULTURE)  PROTIME-INR  APTT  LACTIC ACID, PLASMA  LACTIC ACID, PLASMA  HIV ANTIBODY (ROUTINE TESTING W REFLEX)  HEMOGLOBIN AND HEMATOCRIT, BLOOD  HEMOGLOBIN AND HEMATOCRIT, BLOOD  TYPE AND SCREEN     EKG  See ED course below   RADIOLOGY X-rays interpreted by myself and radiology report reviewed.  CTA pending, vascular surgery team to follow.  PROCEDURES:  Critical Care performed: No  Procedures   MEDICATIONS ORDERED IN ED: Medications  morphine (PF) 4 MG/ML injection 4 mg (4 mg Intravenous Given 11/23/23 1353)  0.9 %  sodium chloride infusion (has no administration in time range)  ondansetron (ZOFRAN) tablet 4 mg (has no administration in time range)    Or  ondansetron (ZOFRAN) injection 4 mg (has no administration in time range)  nicotine (NICODERM CQ - dosed in mg/24 hours) patch 21 mg (has no administration in time range)  morphine (PF) 4 MG/ML injection 4 mg (4 mg Intravenous Given 11/23/23 1035)  acetaminophen (TYLENOL) tablet 1,000 mg (1,000 mg Oral Given 11/23/23 1034)  pantoprazole (PROTONIX) injection 40 mg (40 mg Intravenous Given 11/23/23 1353)  iohexol (OMNIPAQUE) 350 MG/ML injection 100 mL (100 mLs Intravenous Contrast Given 11/23/23 1211)     IMPRESSION / MDM / ASSESSMENT AND PLAN / ED COURSE  I reviewed the triage vital signs and the nursing notes.                              Differential diagnosis includes, but is not limited to, superimposed infection of recently ischemic tissue on foot, consider possibility of repeat arterial occlusion though less likely on initial eval given  warm to touch--will attempt Doppler shortly to ensure pulses present.  Consider normal inflammatory response after tissue injury.  Clinically doubt necrotizing fasciitis at this time though we will continue to monitor closely.  No clinical concern for worsening pulmonary embolism other cardiac etiology at this time.  With regard to bloody stools, unclear etiology--doubt brisk GI bleed given patient is hemodynamically stable and otherwise well-appearing, consider possibility of hemorrhoids or colonic bleeding.  No abdominal tenderness to suggest diverticulitis or other acute intra-abdominal pathology as etiology.  Plan: -Labs -n.p.o. -Pain control - rectal exam -X-ray left foot, consider escalation to CTA if unable to get good Doppler pulse -Anticipate need for discussion with vascular surgery and/or podiatry  Patient's presentation is most consistent with acute presentation with potential threat to life or bodily function.    The patient is on the cardiac monitor to evaluate for evidence of arrhythmia and/or significant heart rate changes.  ED course below.  Workup with significant leukocytosis and mild anemia, both of which are stable compared to patient's labs on 11/21/2023 at [facility] on day of discharge.  Podiatry, vascular surgery and subsequently admitted to medicine service with vascular surgery to follow.  Did recommend CTA aorta with runoff to evaluate both for possible etiology of bloody stools as well as further evaluate left lower extremity.  Patient already on Eliquis, vascular surgery recommends deferring heparin until results of CT can be reviewed.  Deferring antibiotics and coordination with vascular surgery team and medicine team, no clear infection at this time.  Admitted to medicine service.   Clinical Course as of 11/23/23 1558  Wed Nov 23, 2023  0918 Ecg = sinus rhythm, rate 80, no ST elevation or depression, no significant repolarization abnormalities, normal axis, normal  intervals.  QTc borderline at 494.  No evidence of ischemia on my read. [MM]  1046 Message sent to on-call podiatrist for consultation [MM]  1055 D/w Dr. Annamary Rummage of podiatry Suspects dry gangrene Rec d/w  [MM]  1106 CBC overall stable from 2 days ago--leukocytosis 28 today, was 26.3.  Hemoglobin was 11.6, 11.4 today.  No significant drop in blood level. [MM]  1107 Vascular paged [MM]  1114 D/w Dr. Gilda Crease of vascular - CTA AP w/ bilateral runoff --vascular to follow - recommends admission - vasc surg to eval - defer abx to medicine [MM]  1133 Presented to hospitalist for admission [MM]    Clinical Course User Index [MM] Marinell Blight, MD     FINAL CLINICAL IMPRESSION(S) / ED DIAGNOSES   Final diagnoses:  Blister of left foot, initial encounter  Gangrene of left foot (HCC)  Bloody stools  Leukocytosis, unspecified type     Rx / DC Orders   ED Discharge Orders     None        Note:  This document was prepared using Dragon voice recognition software and may include unintentional dictation errors.   Marinell Blight, MD 11/23/23 [phone]

## 2023-11-23 NOTE — Assessment & Plan Note (Signed)
 Noted recent diagnosis in the [facility] system with PE CT runoff today with nonspecific findings including Mixed tree-in-bud and centrilobular nodularity of the lung bases concerning for sequela secondary to recent pulmonary thromboembolism Holding anticoagulation for now in setting of GI bleeding as well as pending vascular evaluation No chest pain, hypoxia or tachycardia present Monitor

## 2023-11-23 NOTE — Assessment & Plan Note (Signed)
1 pack/day smoker Discussed cessation at length Nicotine patch 

## 2023-11-23 NOTE — ED Notes (Signed)
 This RN introduced self to pt. Call light in reach, bed wheels locked, side rail raised, pt updated on plan of care. Rounding completed. Fall precautions in place.

## 2023-11-23 NOTE — ED Notes (Signed)
 Writer unable to doppler pedal pulses on RT and LT lower extremities. Mian MD notified and is bedside assessing bilateral pedal pulses with doppler.

## 2023-11-23 NOTE — Consult Note (Signed)
 Hospital Consult    Reason for Consult:  Left Lower Extremity foot gangrene with blisters.  Requesting Physician:  Dr Herma Mering MD  MRN #:  [id number]  History of Present Illness: This is a 53 y.o. female hypertension, GERD, bipolar disorder, breast cancer, recent admission to [facility] for PE and arterial occlusion of left lower extremity presents for evaluation of foot pain and blood in stools. Patient endorses that yesterday evening she noticed development of some blisters on the dorsum of her left foot, no increased pain, no increased redness, no fevers or chills.   Upon examination in the emergency department today the patient has a staple line incision in her left groin as well as a medial and lateral incision to her left calf that appears to have been sutured and closed with Dermabond.  Patient states she underwent the surgery on 11/14/2023 at [facility].  He was then advised that she would need to have a left below the knee amputation completed due to the failure of surgery.  Three quarters of the patient's left foot is gangrene and cold.  This includes all of her toes.  There are blistering areas circumferential around her foot as well.  Patient states that she decided to discharge to home on Monday, 11/21/2023 and due to pain and blistering she has now presented to [facility] for a second opinion.  She currently endorses being discharged on Eliquis 5 mg twice daily and aspirin 81 mg daily.  Vascular surgery was consulted to evaluate.  Past Medical History:  Diagnosis Date   Breast cancer (HCC)    Cancer (HCC) 09/2018   Right breast   COVID-19 11/2018   Family history of breast cancer    Hypertension    Personal history of radiation therapy 2020   Rt breast    Past Surgical History:  Procedure Laterality Date   BREAST BIOPSY Right 09/12/2018   [place] core/"venus clip"-INVASIVE MAMMARYLobular CARCINOMA, WITH LOBULAR FEATURES   BREAST LUMPECTOMY Right 10/04/2018   BREAST LUMPECTOMY WITH  NEEDLE LOCALIZATION AND AXILLARY SENTINEL LYMPH NODE BX Right 10/04/2018   Procedure: BREAST LUMPECTOMY WITH NEEDLE LOCALIZATION AND AXILLARY SENTINEL LYMPH NODE BX;  Surgeon: Carolan Shiver, MD;  Location: [facility] ORS;  Service: General;  Laterality: Right;   TONSILLECTOMY      Allergies  Allergen Reactions   Amlodipine Swelling    Prior to Admission medications   Medication Sig Start Date End Date Taking? Authorizing Provider  acetaminophen (TYLENOL) 500 MG tablet Take 500 mg by mouth every 6 (six) hours as needed for moderate pain. Patient not taking: Reported on 07/20/2022    [provider]  celecoxib (CELEBREX) 50 MG capsule Take 50 mg by mouth 2 (two) times daily.    [provider]  citalopram (CELEXA) 10 MG tablet Take 10 mg by mouth daily.    [provider]  divalproex (DEPAKOTE ER) 500 MG 24 hr tablet Take 2 tablets (1,000 mg total) by mouth at bedtime. Patient not taking: Reported on 07/20/2022 01/15/21   Jesse Sans, MD  hydrochlorothiazide (HYDRODIURIL) 25 MG tablet Take 1 tablet by mouth daily. 12/04/20   [provider]  OLANZapine (ZYPREXA) 20 MG tablet Take 1 tablet (20 mg total) by mouth at bedtime. Patient not taking: Reported on 07/20/2022 01/15/21   Jesse Sans, MD  pantoprazole (PROTONIX) 40 MG tablet Take 40 mg by mouth daily as needed (heartburn).  07/18/18   [provider]  PROVENTIL HFA 108 (90 Base) MCG/ACT inhaler  Inhale 2 puffs into the lungs every 4 (four) hours as needed. 12/04/20   [provider]    Social History   Socioeconomic History   Marital status: Married    Spouse name: Not on file   Number of children: Not on file   Years of education: Not on file   Highest education level: Not on file  Occupational History   Not on file  Tobacco Use   Smoking status: Every Day    Current packs/day: 1.00    Average packs/day: 1 pack/day for 15.0 years (15.0 ttl pk-yrs)    Types: Cigarettes    Smokeless tobacco: Never   Tobacco comments:    patient declined  Vaping Use   Vaping status: Former  Substance and Sexual Activity   Alcohol use: Not Currently    Comment: not taken for about 6 weeks   Drug use: Not Currently    Types: Marijuana    Comment: Uses street opioids   Sexual activity: Not Currently  Other Topics Concern   Not on file  Social History Narrative   Works at [employer];  Smoke 15 cig/day; No alcohol abuse; in [employer]; with husband.    Social Drivers of Corporate investment banker Strain: Low Risk  (11/15/2023)   Received from [facility]   Overall Financial Resource Strain (CARDIA)    Difficulty of Paying Living Expenses: Not very hard  Food Insecurity: No Food Insecurity (11/15/2023)   Received from [facility]   Hunger Vital Sign    Worried About Running Out of Food in the Last Year: Never true    Ran Out of Food in the Last Year: Never true  Transportation Needs: No Transportation Needs (11/15/2023)   Received from [facility] - Transportation    Lack of Transportation (Medical): No    Lack of Transportation (Non-Medical): No  Physical Activity: Not on file  Stress: Not on file  Social Connections: Not on file  Intimate Partner Violence: Not At Risk (11/12/2023)   Received from [facility]   Humiliation, Afraid, Rape, and Kick questionnaire    Fear of Current or Ex-Partner: No    Emotionally Abused: No    Physically Abused: No    Sexually Abused: No     Family History  Problem Relation Age of Onset   Breast cancer Paternal Aunt     ROS: Otherwise negative unless mentioned in HPI  Physical Examination  Vitals:   11/23/23 0940 11/23/23 0945  BP:    Pulse: 80 81  Resp: 13 14  Temp:    SpO2: 97% 98%   Body mass index is 30.9 kg/m.  General:  WDWN in NAD Gait: Not observed HENT: WNL, normocephalic Pulmonary: normal non-labored breathing, without Rales, rhonchi,  wheezing Cardiac:  regular, without  Murmurs, rubs or gallops; without carotid bruits Abdomen: Positive bowel sounds throughout,  soft, NT/ND, no masses Skin: without rashes. Positive blisters to left dorsal foot.  Vascular Exam/Pulses: Right lower extremity with palpable pulses, Left lower extremity with gangrene to left toes and forefoot. Forefoot and toes are cold to touch. Doppler PT pulse is strong but unable to doppler DP pulse.  Extremities: with ischemic changes, with Gangrene , with cellulitis; without open wounds;  Musculoskeletal: no muscle wasting or atrophy  Neurologic: A&O X 3;  No focal weakness or paresthesias are detected; speech is fluent/normal Psychiatric:  The pt has flat affect. Lymph:  Unremarkable  CBC  Component Value Date/Time   WBC 28.0 (H) 11/23/2023 0949   RBC 3.60 (L) 11/23/2023 0949   HGB 11.4 (L) 11/23/2023 0949   HCT 35.2 (L) 11/23/2023 0949   PLT 885 (H) 11/23/2023 0949   MCV 97.8 11/23/2023 0949   MCH 31.7 11/23/2023 0949   MCHC 32.4 11/23/2023 0949   RDW 15.7 (H) 11/23/2023 0949   LYMPHSABS 2.8 11/23/2023 0949   MONOABS 1.8 (H) 11/23/2023 0949   EOSABS 0.6 (H) 11/23/2023 0949   BASOSABS 0.4 (H) 11/23/2023 0949    BMET    Component Value Date/Time   NA 135 11/23/2023 0949   K 3.9 11/23/2023 0949   CL 102 11/23/2023 0949   CO2 23 11/23/2023 0949   GLUCOSE 112 (H) 11/23/2023 0949   BUN 14 11/23/2023 0949   CREATININE 0.78 11/23/2023 0949   CALCIUM 8.7 (L) 11/23/2023 0949   GFRNONAA >60 11/23/2023 0949   GFRAA >60 05/31/2016 1152    COAGS: Lab Results  Component Value Date   INR 1.1 11/23/2023     Non-Invasive Vascular Imaging:   EXAM:11/23/23 CT ANGIOGRAPHY OF ABDOMINAL AORTA WITH ILIOFEMORAL RUNOFF   TECHNIQUE: Multidetector CT imaging of the abdomen, pelvis and lower extremities was performed using the standard protocol during bolus administration of intravenous contrast. Multiplanar CT image reconstructions and MIPs were obtained to evaluate  the vascular anatomy.   RADIATION DOSE REDUCTION: This exam was performed according to the departmental dose-optimization program which includes automated exposure control, adjustment of the mA and/or kV according to patient size and/or use of iterative reconstruction technique.   CONTRAST:  OMNIPAQUE IOHEXOL 350 MG/ML SOLN   COMPARISON:  None Available.   FINDINGS: VASCULAR   VASCULAR   Aorta: Unremarkable course, caliber, contour of the abdominal aorta. No dissection, aneurysm, or periaortic fluid. Moderate mixed calcified and atheromatous plaque without pedunculated plaque.   Celiac: Patent, with no significant atherosclerotic changes.   SMA: Patent, with no significant atherosclerotic changes.   Renals:   - Right: Single right renal artery. Mixed calcified and soft plaque at the origin with estimated 50% or less narrowing.   - Left: Single left renal artery. Mixed calcified and soft plaque at the origin without significant narrowing.   IMA: Inferior mesenteric artery is patent.   Right lower extremity:   Mixed atheromatous and calcified plaque at the origin of the common iliac artery, with estimated 50% narrowing. Calcified plaque through the remainder of the CIA without narrowing.   Hypogastric artery is patent with patency of the pelvic arteries.   External iliac artery patent without significant atherosclerotic changes.   Minimal atherosclerosis of the common femoral artery.   Profunda femoris and the thigh branches are patent.   SFA is patent without significant atherosclerotic changes. Popliteal artery patent.   Typical trifurcation with patency of the proximal anterior tibial artery, the tibioperoneal trunk, proximal peroneal artery and proximal posterior tibial artery.   The distal tibial arteries of the right lower extremity are limited in evaluation given the timing of the contrast bolus. Despite this, at least anterior tibial artery and  posterior tibial artery appear patent to the ankle.   Left lower extremity:   Mixed atheromatous and calcified plaque at the origin the left common iliac artery. No high-grade stenosis. Calcifications through the length of the common iliac artery which is patent.   Hypogastric artery patent.  Pelvic arteries patent.   External iliac artery patent without significant atherosclerotic changes.   No significant atherosclerotic changes of the  common femoral artery.   Profunda femoris and the thigh branches patent.   Superficial femoral artery is patent throughout its length without significant atherosclerotic changes.   Popliteal artery patent.   Anterior tibial artery origin patent.   Tibioperoneal trunk patent.   The distal tibial arteries of the left lower extremity are limited given the timing of the contrast bolus.   The CTA is nondiagnostic for patency of the tibial arteries in the mid and distal lower leg.   Veins: Unremarkable appearance of the unopacified venous system.   Review of the MIP images confirms the above findings.   NON-VASCULAR   Lower chest: Mix tree-in-bud and centrilobular nodularity in the bilateral lung bases. No comparison. No pleural effusion. No significant bronchial wall thickening or endobronchial debris.   Hepatobiliary: Focal fatty infiltration adjacent to the falciform ligament. Unremarkable gall bladder.   Pancreas: Unremarkable.   Spleen: Unremarkable.   Adrenals/Urinary Tract:   - Right adrenal gland: Unremarkable   - Left adrenal gland: Unremarkable.   - Right kidney: No hydronephrosis, nephrolithiasis, inflammation, or ureteral dilation. No focal lesion.   - Left Kidney: No hydronephrosis, nephrolithiasis, inflammation, or ureteral dilation. No focal lesion.   - Urinary Bladder: Unremarkable.   Stomach/Bowel:   - Stomach: Unremarkable.   - Small bowel: Unremarkable   - Appendix: Normal.   - Colon: The colon is  fluid-filled from the hepatic flexure through the transverse colon, splenic flexure, descending colon and sigmoid colon. Diverticula present within the sigmoid colon with some associated wall thickening and mild local inflammation of the fascia/mesentery.   Lymphatic: No adenopathy.   Mesenteric: No free fluid or air. No mesenteric adenopathy.   Reproductive: Unremarkable appearance of the pelvic organs.   Other: Surgical changes of the left inguinal region with some fatty stranding and small focal fluid. No evidence arterial pseudoaneurysm. No evidence of hematoma or abscess. Surgical staples present within the superficial soft tissues.   Musculoskeletal: No evidence of acute fracture. No bony canal narrowing. No significant degenerative changes of the hips.   IMPRESSION: CT angiogram demonstrates predominantly aorto iliac disease, including:   -moderate irregular aortic atherosclerosis, with mixed atheromatous and calcified plaque. Aortic Atherosclerosis (ICD10-I70.0).   -bilateral common iliac arterial disease with mixed calcified and atheromatous plaque. There is estimated 50% or less narrowing at the origin of the right CIA, with no significant narrowing of the left CIA.   The CT is nondiagnostic for evaluation of the left-sided tibial arteries, and patency cannot be confirmed below the popliteal artery and trifurcation.   Incidental subsegmental pulmonary embolism of the right lung base.   Bilateral renal arterial disease, without high-grade stenosis.   Fluid-filled colon, can be seen with nonspecific enteritis/colitis and correlation with symptoms may be useful. There is questionable early inflammatory changes in the wall of the sigmoid colon, at the site of the most pronounced diverticular disease, potentially representing early diverticulitis.   Mixed tree-in-bud and centrilobular nodularity of the lung bases. This may be inflammatory/infectious or  alternatively could be related to the patient's known diagnosis of PE/VTE.   Additional ancillary findings as above.   Signed,   Yvone Neu. Loreta Ave DO, ABVM, RPVI   [facility]    Statin:  No. Beta Blocker:  No. Aspirin:  Yes.   ACEI:  No. ARB:  No. CCB use:  No Other antiplatelets/anticoagulants:  Yes.   Eliquis 5 mg BID    ASSESSMENT/PLAN: This is a 53 y.o. female who presents to [facility]  emergency department with blistering to the dorsum of her left foot as well as change in color.  Patient is now postoperative from 11/14/2023 at [facility] for an embolectomy/thrombectomy of her left lower extremity.  Patient presented with a white blood cell count of 28, hemoglobin of 11.4 and hematocrit of 35.2.  Lites were elevated at 885.  Dr. Vilinda Flake had a long conversation with the patient at the bedside last evening concerning the condition of her left lower extremity and the blistering to her left foot.  It was agreed upon that we would take the patient to the vascular lab on 11/24/2023 for left lower extremity angiogram to visualize the actual blood flow to her foot distally to determine a course of surgery to either include a transmetatarsal amputation via podiatry or possible below the knee amputation.  Patient verbalized her understanding and wishes to proceed to soon as possible.  Dr. Gilda Crease answered all the patient's questions.  Patient was made n.p.o. after midnight last night for procedure today.  -I discussed the findings and the case with Dr. Levora Dredge MD and he agrees with the plan.   Marcie Bal [facility] 11/23/2023 11:38 AM

## 2023-11-24 ENCOUNTER — Encounter
Admission: EM | Disposition: A | Discharge status: Home Health | Payer: Self-pay | Source: Home / Self Care | Attending: Internal Medicine

## 2023-11-24 DIAGNOSIS — I70201 Unspecified atherosclerosis of native arteries of extremities, right leg: Secondary | ICD-10-CM | POA: Diagnosis not present

## 2023-11-24 DIAGNOSIS — Z7901 Long term (current) use of anticoagulants: Secondary | ICD-10-CM | POA: Diagnosis not present

## 2023-11-24 DIAGNOSIS — E538 Deficiency of other specified B group vitamins: Secondary | ICD-10-CM | POA: Diagnosis present

## 2023-11-24 DIAGNOSIS — Z8616 Personal history of COVID-19: Secondary | ICD-10-CM | POA: Diagnosis not present

## 2023-11-24 DIAGNOSIS — K55059 Acute (reversible) ischemia of intestine, part and extent unspecified: Secondary | ICD-10-CM | POA: Diagnosis not present

## 2023-11-24 DIAGNOSIS — D509 Iron deficiency anemia, unspecified: Secondary | ICD-10-CM | POA: Diagnosis present

## 2023-11-24 DIAGNOSIS — K269 Duodenal ulcer, unspecified as acute or chronic, without hemorrhage or perforation: Secondary | ICD-10-CM | POA: Diagnosis present

## 2023-11-24 DIAGNOSIS — Z87891 Personal history of nicotine dependence: Secondary | ICD-10-CM | POA: Diagnosis not present

## 2023-11-24 DIAGNOSIS — K219 Gastro-esophageal reflux disease without esophagitis: Secondary | ICD-10-CM | POA: Diagnosis present

## 2023-11-24 DIAGNOSIS — I2693 Single subsegmental pulmonary embolism without acute cor pulmonale: Secondary | ICD-10-CM | POA: Diagnosis present

## 2023-11-24 DIAGNOSIS — Z9889 Other specified postprocedural states: Secondary | ICD-10-CM | POA: Diagnosis not present

## 2023-11-24 DIAGNOSIS — K921 Melena: Secondary | ICD-10-CM | POA: Diagnosis not present

## 2023-11-24 DIAGNOSIS — I2699 Other pulmonary embolism without acute cor pulmonale: Secondary | ICD-10-CM | POA: Diagnosis not present

## 2023-11-24 DIAGNOSIS — X58XXXA Exposure to other specified factors, initial encounter: Secondary | ICD-10-CM | POA: Diagnosis present

## 2023-11-24 DIAGNOSIS — I70222 Atherosclerosis of native arteries of extremities with rest pain, left leg: Secondary | ICD-10-CM | POA: Diagnosis not present

## 2023-11-24 DIAGNOSIS — I700 Atherosclerosis of aorta: Secondary | ICD-10-CM

## 2023-11-24 DIAGNOSIS — D638 Anemia in other chronic diseases classified elsewhere: Secondary | ICD-10-CM | POA: Diagnosis present

## 2023-11-24 DIAGNOSIS — I70262 Atherosclerosis of native arteries of extremities with gangrene, left leg: Secondary | ICD-10-CM | POA: Diagnosis present

## 2023-11-24 DIAGNOSIS — I998 Other disorder of circulatory system: Secondary | ICD-10-CM | POA: Diagnosis not present

## 2023-11-24 DIAGNOSIS — K259 Gastric ulcer, unspecified as acute or chronic, without hemorrhage or perforation: Secondary | ICD-10-CM | POA: Diagnosis present

## 2023-11-24 DIAGNOSIS — S90822A Blister (nonthermal), left foot, initial encounter: Secondary | ICD-10-CM | POA: Diagnosis present

## 2023-11-24 DIAGNOSIS — R197 Diarrhea, unspecified: Secondary | ICD-10-CM | POA: Diagnosis not present

## 2023-11-24 DIAGNOSIS — K633 Ulcer of intestine: Secondary | ICD-10-CM | POA: Diagnosis present

## 2023-11-24 DIAGNOSIS — Z89512 Acquired absence of left leg below knee: Secondary | ICD-10-CM | POA: Diagnosis not present

## 2023-11-24 DIAGNOSIS — I743 Embolism and thrombosis of arteries of the lower extremities: Secondary | ICD-10-CM

## 2023-11-24 DIAGNOSIS — K5733 Diverticulitis of large intestine without perforation or abscess with bleeding: Secondary | ICD-10-CM | POA: Diagnosis present

## 2023-11-24 DIAGNOSIS — Z888 Allergy status to other drugs, medicaments and biological substances status: Secondary | ICD-10-CM | POA: Diagnosis not present

## 2023-11-24 DIAGNOSIS — F17210 Nicotine dependence, cigarettes, uncomplicated: Secondary | ICD-10-CM | POA: Diagnosis present

## 2023-11-24 DIAGNOSIS — K559 Vascular disorder of intestine, unspecified: Secondary | ICD-10-CM | POA: Diagnosis present

## 2023-11-24 DIAGNOSIS — K2971 Gastritis, unspecified, with bleeding: Secondary | ICD-10-CM | POA: Diagnosis present

## 2023-11-24 DIAGNOSIS — I96 Gangrene, not elsewhere classified: Secondary | ICD-10-CM | POA: Diagnosis not present

## 2023-11-24 DIAGNOSIS — F319 Bipolar disorder, unspecified: Secondary | ICD-10-CM | POA: Diagnosis present

## 2023-11-24 DIAGNOSIS — F1910 Other psychoactive substance abuse, uncomplicated: Secondary | ICD-10-CM | POA: Diagnosis not present

## 2023-11-24 DIAGNOSIS — Z923 Personal history of irradiation: Secondary | ICD-10-CM | POA: Diagnosis not present

## 2023-11-24 DIAGNOSIS — D62 Acute posthemorrhagic anemia: Secondary | ICD-10-CM | POA: Diagnosis present

## 2023-11-24 DIAGNOSIS — K5730 Diverticulosis of large intestine without perforation or abscess without bleeding: Secondary | ICD-10-CM | POA: Diagnosis present

## 2023-11-24 DIAGNOSIS — K922 Gastrointestinal hemorrhage, unspecified: Secondary | ICD-10-CM | POA: Diagnosis not present

## 2023-11-24 DIAGNOSIS — K2980 Duodenitis without bleeding: Secondary | ICD-10-CM | POA: Diagnosis present

## 2023-11-24 DIAGNOSIS — I10 Essential (primary) hypertension: Secondary | ICD-10-CM | POA: Diagnosis present

## 2023-11-24 DIAGNOSIS — F1210 Cannabis abuse, uncomplicated: Secondary | ICD-10-CM | POA: Diagnosis present

## 2023-11-24 DIAGNOSIS — K625 Hemorrhage of anus and rectum: Secondary | ICD-10-CM | POA: Diagnosis not present

## 2023-11-24 DIAGNOSIS — M79672 Pain in left foot: Secondary | ICD-10-CM | POA: Diagnosis present

## 2023-11-24 HISTORY — PX: LOWER EXTREMITY ANGIOGRAPHY: CATH118251

## 2023-11-24 LAB — CBC WITH DIFFERENTIAL/PLATELET
Abs Immature Granulocytes: 0.48 10*3/uL — ABNORMAL HIGH (ref 0.00–0.07)
Basophils Absolute: 0.3 10*3/uL — ABNORMAL HIGH (ref 0.0–0.1)
Basophils Relative: 1 %
Eosinophils Absolute: 0.6 10*3/uL — ABNORMAL HIGH (ref 0.0–0.5)
Eosinophils Relative: 2 %
HCT: 30.4 % — ABNORMAL LOW (ref 36.0–46.0)
Hemoglobin: 10.2 g/dL — ABNORMAL LOW (ref 12.0–15.0)
Immature Granulocytes: 2 %
Lymphocytes Relative: 8 %
Lymphs Abs: 2 10*3/uL (ref 0.7–4.0)
MCH: 31.8 pg (ref 26.0–34.0)
MCHC: 33.6 g/dL (ref 30.0–36.0)
MCV: 94.7 fL (ref 80.0–100.0)
Monocytes Absolute: 1.5 10*3/uL — ABNORMAL HIGH (ref 0.1–1.0)
Monocytes Relative: 6 %
Neutro Abs: 19 10*3/uL — ABNORMAL HIGH (ref 1.7–7.7)
Neutrophils Relative %: 81 %
Platelets: 875 10*3/uL — ABNORMAL HIGH (ref 150–400)
RBC: 3.21 MIL/uL — ABNORMAL LOW (ref 3.87–5.11)
RDW: 15.9 % — ABNORMAL HIGH (ref 11.5–15.5)
WBC: 23.8 10*3/uL — ABNORMAL HIGH (ref 4.0–10.5)
nRBC: 0 % (ref 0.0–0.2)

## 2023-11-24 LAB — COMPREHENSIVE METABOLIC PANEL WITH GFR
ALT: 27 U/L (ref 0–44)
AST: 28 U/L (ref 15–41)
Albumin: 2.5 g/dL — ABNORMAL LOW (ref 3.5–5.0)
Alkaline Phosphatase: 97 U/L (ref 38–126)
Anion gap: 10 (ref 5–15)
BUN: 15 mg/dL (ref 6–20)
CO2: 21 mmol/L — ABNORMAL LOW (ref 22–32)
Calcium: 8.3 mg/dL — ABNORMAL LOW (ref 8.9–10.3)
Chloride: 102 mmol/L (ref 98–111)
Creatinine, Ser: 0.68 mg/dL (ref 0.44–1.00)
GFR, Estimated: >60 mL/min (ref >60)
Glucose, Bld: 124 mg/dL — ABNORMAL HIGH (ref 70–99)
Potassium: 3.8 mmol/L (ref 3.5–5.1)
Sodium: 133 mmol/L — ABNORMAL LOW (ref 135–145)
Total Bilirubin: 0.5 mg/dL (ref 0.0–1.2)
Total Protein: 6.6 g/dL (ref 6.5–8.1)

## 2023-11-24 LAB — APTT
aPTT: 34 s (ref 24–36)
aPTT: 42 s — ABNORMAL HIGH (ref 24–36)

## 2023-11-24 LAB — HEMOGLOBIN AND HEMATOCRIT, BLOOD
HCT: 28.6 % — ABNORMAL LOW (ref 36.0–46.0)
HCT: 28.8 % — ABNORMAL LOW (ref 36.0–46.0)
HCT: 29.1 % — ABNORMAL LOW (ref 36.0–46.0)
Hemoglobin: 9.5 g/dL — ABNORMAL LOW (ref 12.0–15.0)
Hemoglobin: 9.6 g/dL — ABNORMAL LOW (ref 12.0–15.0)
Hemoglobin: 9.9 g/dL — ABNORMAL LOW (ref 12.0–15.0)

## 2023-11-24 LAB — HEPARIN LEVEL (UNFRACTIONATED): Heparin Unfractionated: 0.78 [IU]/mL — ABNORMAL HIGH (ref 0.30–0.70)

## 2023-11-24 LAB — VITAMIN B12: Vitamin B-12: 533 pg/mL (ref 180–914)

## 2023-11-24 LAB — C DIFFICILE QUICK SCREEN W PCR REFLEX
C Diff antigen: NEGATIVE
C Diff interpretation: NOT DETECTED
C Diff toxin: NEGATIVE

## 2023-11-24 SURGERY — LOWER EXTREMITY ANGIOGRAPHY
Anesthesia: Moderate Sedation | Laterality: Left

## 2023-11-24 MED ORDER — HEPARIN (PORCINE) 25000 UT/250ML-% IV SOLN
1800.0000 [IU]/h | INTRAVENOUS | Status: DC
  Administered 2023-11-24 (×2): 1400 [IU]/h via INTRAVENOUS
  Administered 2023-11-25: 1650 [IU]/h via INTRAVENOUS
  Administered 2023-11-26: 1800 [IU]/h via INTRAVENOUS
  Filled 2023-11-24 (×3): qty 250

## 2023-11-24 MED ORDER — MIDAZOLAM HCL 2 MG/2ML IJ SOLN
INTRAMUSCULAR | Status: AC
  Filled 2023-11-24: qty 2

## 2023-11-24 MED ORDER — FAMOTIDINE 20 MG PO TABS: 40.0000 mg | ORAL_TABLET | Freq: Once | ORAL | Status: DC | PRN

## 2023-11-24 MED ORDER — LIDOCAINE HCL (PF) 1 % IJ SOLN
INTRAMUSCULAR | Status: DC | PRN
  Administered 2023-11-24: 10 mL

## 2023-11-24 MED ORDER — SODIUM CHLORIDE 0.9% FLUSH
3.0000 mL | Freq: Two times a day (BID) | INTRAVENOUS | Status: DC
  Administered 2023-11-24 – 2023-11-25 (×2): 3 mL via INTRAVENOUS

## 2023-11-24 MED ORDER — HEPARIN BOLUS VIA INFUSION
2200.0000 [IU] | Freq: Once | INTRAVENOUS | Status: AC
Start: 2023-11-24 — End: 2023-11-24
  Administered 2023-11-24: 2200 [IU] via INTRAVENOUS
  Filled 2023-11-24: qty 2200

## 2023-11-24 MED ORDER — SODIUM CHLORIDE 0.9 % IV SOLN: 250.0000 mL | INTRAVENOUS | Status: AC | PRN

## 2023-11-24 MED ORDER — GABAPENTIN 300 MG PO CAPS
600.0000 mg | ORAL_CAPSULE | Freq: Three times a day (TID) | ORAL | Status: DC
  Administered 2023-11-24 – 2023-11-30 (×14): 600 mg via ORAL
  Filled 2023-11-24 (×14): qty 2

## 2023-11-24 MED ORDER — FENTANYL CITRATE PF 50 MCG/ML IJ SOSY
12.5000 ug | PREFILLED_SYRINGE | Freq: Once | INTRAMUSCULAR | Status: AC | PRN
  Administered 2023-11-24: 12.5 ug via INTRAVENOUS

## 2023-11-24 MED ORDER — HEPARIN (PORCINE) IN NACL 1000-0.9 UT/500ML-% IV SOLN
INTRAVENOUS | Status: DC | PRN
  Administered 2023-11-24: 500 mL

## 2023-11-24 MED ORDER — CEFAZOLIN SODIUM-DEXTROSE 2-4 GM/100ML-% IV SOLN
INTRAVENOUS | Status: AC
  Filled 2023-11-24: qty 100

## 2023-11-24 MED ORDER — MIDAZOLAM HCL 2 MG/ML PO SYRP
ORAL_SOLUTION | ORAL | Status: AC
  Filled 2023-11-24: qty 5

## 2023-11-24 MED ORDER — FENTANYL CITRATE (PF) 100 MCG/2ML IJ SOLN
INTRAMUSCULAR | Status: AC
  Filled 2023-11-24: qty 2

## 2023-11-24 MED ORDER — SODIUM CHLORIDE 0.9% FLUSH: 3.0000 mL | INTRAVENOUS | Status: DC | PRN

## 2023-11-24 MED ORDER — HEPARIN SODIUM (PORCINE) 1000 UNIT/ML IJ SOLN
INTRAMUSCULAR | Status: DC | PRN
  Administered 2023-11-24: 3000 [IU] via INTRAVENOUS

## 2023-11-24 MED ORDER — CEFAZOLIN SODIUM-DEXTROSE 1-4 GM/50ML-% IV SOLN
1.0000 g | INTRAVENOUS | Status: AC
  Administered 2023-11-24: 1 g via INTRAVENOUS

## 2023-11-24 MED ORDER — HEPARIN SODIUM (PORCINE) 1000 UNIT/ML IJ SOLN
INTRAMUSCULAR | Status: AC
  Filled 2023-11-24: qty 10

## 2023-11-24 MED ORDER — NICOTINE POLACRILEX 2 MG MT GUM
2.0000 mg | CHEWING_GUM | OROMUCOSAL | Status: DC | PRN
  Administered 2023-11-24: 2 mg via ORAL
  Filled 2023-11-24: qty 1

## 2023-11-24 MED ORDER — HYDROCODONE-ACETAMINOPHEN 5-325 MG PO TABS
1.0000 | ORAL_TABLET | ORAL | Status: DC | PRN
Start: 2023-11-24 — End: 2023-11-25
  Administered 2023-11-24 (×2): 1 via ORAL
  Administered 2023-11-24: 2 via ORAL
  Administered 2023-11-24: 1 via ORAL
  Administered 2023-11-25 (×2): 2 via ORAL
  Filled 2023-11-24 (×4): qty 2
  Filled 2023-11-24 (×2): qty 1

## 2023-11-24 MED ORDER — HEPARIN BOLUS VIA INFUSION
2200.0000 [IU] | Freq: Once | INTRAVENOUS | Status: AC
  Administered 2023-11-24: 2200 [IU] via INTRAVENOUS
  Filled 2023-11-24: qty 2200

## 2023-11-24 MED ORDER — MIDAZOLAM HCL 2 MG/2ML IJ SOLN
INTRAMUSCULAR | Status: DC | PRN
  Administered 2023-11-24 (×2): 1 mg via INTRAVENOUS
  Administered 2023-11-24: 2 mg via INTRAVENOUS

## 2023-11-24 MED ORDER — FENTANYL CITRATE (PF) 100 MCG/2ML IJ SOLN
INTRAMUSCULAR | Status: AC
Start: 2023-11-24 — End: ?
  Filled 2023-11-24: qty 2

## 2023-11-24 MED ORDER — FENTANYL CITRATE (PF) 100 MCG/2ML IJ SOLN
INTRAMUSCULAR | Status: DC | PRN
  Administered 2023-11-24 (×2): 25 ug via INTRAVENOUS
  Administered 2023-11-24 (×2): 50 ug via INTRAVENOUS

## 2023-11-24 MED ORDER — DIPHENHYDRAMINE HCL 50 MG/ML IJ SOLN: 50.0000 mg | Freq: Once | INTRAMUSCULAR | Status: DC | PRN

## 2023-11-24 MED ORDER — METHYLPREDNISOLONE SODIUM SUCC 125 MG IJ SOLR
125.0000 mg | Freq: Once | INTRAMUSCULAR | Status: DC | PRN
Start: 2023-11-24 — End: 2023-11-24

## 2023-11-24 MED ORDER — SODIUM CHLORIDE 0.9 % IV SOLN: INTRAVENOUS | Status: DC

## 2023-11-24 MED ORDER — ASPIRIN 81 MG PO TBEC
81.0000 mg | DELAYED_RELEASE_TABLET | Freq: Every day | ORAL | Status: DC
  Administered 2023-11-24 – 2023-11-30 (×6): 81 mg via ORAL
  Filled 2023-11-24 (×6): qty 1

## 2023-11-24 MED ORDER — IODIXANOL 320 MG/ML IV SOLN
INTRAVENOUS | Status: DC | PRN
Start: 2023-11-24 — End: 2023-11-24
  Administered 2023-11-24: 45 mL

## 2023-11-24 MED ORDER — SODIUM CHLORIDE 0.9 % IV SOLN: INTRAVENOUS | Status: AC

## 2023-11-24 MED ORDER — MIDAZOLAM HCL 2 MG/ML PO SYRP
8.0000 mg | ORAL_SOLUTION | Freq: Once | ORAL | Status: AC | PRN
  Administered 2023-11-24: 8 mg via ORAL
  Filled 2023-11-24: qty 5

## 2023-11-24 MED ORDER — CITALOPRAM HYDROBROMIDE 10 MG PO TABS
20.0000 mg | ORAL_TABLET | Freq: Every day | ORAL | Status: DC
  Administered 2023-11-24 – 2023-11-30 (×6): 20 mg via ORAL
  Filled 2023-11-24 (×5): qty 2
  Filled 2023-11-24: qty 1
  Filled 2023-11-24: qty 2

## 2023-11-24 MED ORDER — MIDAZOLAM HCL 2 MG/2ML IJ SOLN
INTRAMUSCULAR | Status: AC
Start: 2023-11-24 — End: ?
  Filled 2023-11-24: qty 4

## 2023-11-24 SURGICAL SUPPLY — 12 items
CATH ANGIO 5F PIGTAIL 65CM (CATHETERS) IMPLANT
CATH TEMPO 5F RIM 65CM (CATHETERS) IMPLANT
CATH VERT 5FR 125CM (CATHETERS) IMPLANT
COVER PROBE ULTRASOUND 5X96 (MISCELLANEOUS) IMPLANT
DEVICE STARCLOSE SE CLOSURE (Vascular Products) IMPLANT
GLIDEWIRE ADV .035X260CM (WIRE) IMPLANT
GOWN STRL REUS W/ TWL LRG LVL3 (GOWN DISPOSABLE) ×1 IMPLANT
NDL ENTRY 21GA 7CM ECHOTIP (NEEDLE) IMPLANT
NEEDLE ENTRY 21GA 7CM ECHOTIP (NEEDLE) ×1 IMPLANT
PACK ANGIOGRAPHY (CUSTOM PROCEDURE TRAY) ×1 IMPLANT
SET INTRO CAPELLA COAXIAL (SET/KITS/TRAYS/PACK) IMPLANT
SHEATH BRITE TIP 5FRX11 (SHEATH) IMPLANT

## 2023-11-24 NOTE — Progress Notes (Signed)
  PROGRESS NOTE    Penny Chase  ZOX:[id number] DOB: 13-Apr-1971 DOA: 11/23/2023 PCP: [facility]  ARCL/NONE  LOS: 0 days   Brief hospital course:   Assessment & Plan: Penny Chase is a 53 y.o. female with medical history significant of PE, hypertension, tobacco abuse, substance abuse, history of right breast cancer presenting with left lower extremity ischemia, GI bleeding, diverticulitis.  Patient reports worsening left foot pain redness and swelling over the past 1 to 2 days.  Noted to have been recently evaluated in the [facility] system March 29 through April 7 with issues including multiple subsegmental PEs, lower extremity arterial occlusion status post thrombectomy.    * Limb ischemia Gangrene of left foot Worsening left lower extremity distal foot redness and discoloration concerning for limb ischemia and dry gangrene Noted recent admission in the [facility] system for similar issues with patient refusing amputation. Procedures included iliac thrombus in addition to thrombus in the AT and PT. S/p L CFA/AT/PT cutdown, L AT/PT thromboembolectomy 11/14/23-patient refused amputation --per podiatry, no need for abx --angiogram today  PE (pulmonary thromboembolism) (HCC) Noted recent diagnosis in the [facility] system with PE --cont heparin gtt for now, due to GI bleed  Leukocytosis Marked white count of 28 on presentation Suspect multifactorial with reactive component given multiple recent vascular procedures as well as suspected diverticulitis on CT scan  Lower GI bleeding Noted bloody bowel movements x 12 to 24 hours in the setting of anticoagulation use CT imaging with noted diverticulitis --monitor Hgb  Substance abuse (HCC) Pt self admits to MJ use  Tobacco abuse 1 pack/day smoker Discussed cessation at length --cont nicotine patch and gum  HTN (hypertension), benign BP stable   DVT prophylaxis: [id number] gtt Code Status: Full code  Family Communication:  husband updated at bedside today Level of care: Med-Surg Dispo:   The patient is from: home Anticipated d/c is to: home Anticipated d/c date is: 1-2 days   Subjective and Interval History:  Pt complained of foot pain.     Objective: Vitals:   11/24/23 1707 11/24/23 1715 11/24/23 1730 11/24/23 1745  BP: (!) 143/81 123/62 (!) 121/100 126/85  Pulse: 73 71 74 69  Resp: 10 12 15 14   Temp:  97.6 F (36.4 C)    TempSrc:  Oral    SpO2: 100% 97% 99% 99%  Weight:      Height:        Intake/Output Summary (Last 24 hours) at 11/24/2023 1818 Last data filed at 11/24/2023 0600 Gross per 24 hour  Intake 1371.55 ml  Output --  Net 1371.55 ml   Filed Weights   11/23/23 0902  Weight: 81.6 kg    Examination:   Constitutional: NAD, AAOx3 HEENT: conjunctivae and lids normal, EOMI CV: No cyanosis.   RESP: normal respiratory effort, on RA Extremities: left forefoot purple and black, cool to the touch Neuro: II - XII grossly intact.   Psych: Normal mood and affect.  Appropriate judgement and reason   Data Reviewed: I have personally reviewed labs and imaging studies  Time spent: 50 minutes  Darlin Priestly, MD [facility] If 7PM-7AM, please contact night-coverage 11/24/2023, 6:18 PM

## 2023-11-24 NOTE — Plan of Care (Signed)
  Problem: Education: Goal: Knowledge of General Education information will improve Description: Including pain rating scale, medication(s)/side effects and non-pharmacologic comfort measures Outcome: Progressing   Problem: Clinical Measurements: Goal: Ability to maintain clinical measurements within normal limits will improve Outcome: Progressing   Problem: Clinical Measurements: Goal: Diagnostic test results will improve Outcome: Progressing   

## 2023-11-24 NOTE — Plan of Care (Signed)
 Pt had two small bloody BM. Heparin gtt initiated per order. Pain managed with PRN pain medication. Hgb trending down from previous day. Pt educated prior to starting heparin gtt to report any new bruises, abdominal pain, or increased frequency of bloody BM. No c/o shortness of breath, chest pain, or dizziness.    Problem: Education: Goal: Knowledge of General Education information will improve Description: Including pain rating scale, medication(s)/side effects and non-pharmacologic comfort measures Outcome: Progressing   Problem: Health Behavior/Discharge Planning: Goal: Ability to manage health-related needs will improve Outcome: Progressing   Problem: Clinical Measurements: Goal: Ability to maintain clinical measurements within normal limits will improve Outcome: Progressing   Problem: Activity: Goal: Risk for activity intolerance will decrease Outcome: Progressing   Problem: Coping: Goal: Level of anxiety will decrease Outcome: Progressing   Problem: Elimination: Goal: Will not experience complications related to bowel motility Outcome: Progressing   Problem: Pain Managment: Goal: General experience of comfort will improve and/or be controlled Outcome: Progressing

## 2023-11-24 NOTE — Plan of Care (Signed)
 Per vascular note plan for angiogram left lower extremity today.  Podiatry will continue to follow after the angiogram and discuss  with vascular if TMA is possible or if the current level of demarcation holds she would probably be better off with a below the knee amputation.  Dr. Jamse Arn will be taking over and will follow up tomorrow.        Corinna Gab, DPM [facility] / [facility]                   11/24/2023

## 2023-11-24 NOTE — Op Note (Signed)
 [facility]  Percutaneous Study/Intervention Procedural Note   Date of Surgery: 11/24/2023,5:20 PM  Surgeon:[place] Kretz, Latina Craver   Pre-operative Diagnosis: Atherosclerotic occlusive disease bilateral lower extremities with ischemic changes left foot.  Post-operative diagnosis:  Same  Procedure(s) Performed:  1.  Abdominal aortogram  2.  Selective injection of the left lower extremity third order catheter placement  3.  Ultrasound-guided access to the right common femoral artery  4.  StarClose right femoral artery    Anesthesia: Conscious sedation was administered by the interventional radiology RN under my direct supervision. IV Versed plus fentanyl were utilized. Continuous ECG, pulse oximetry and blood pressure was monitored throughout the entire procedure.  Conscious sedation was administered for a total of 35 minutes and 32 seconds minutes.  Sheath: 5 French 11 cm Pinnacle sheath retrograde right common femoral  Contrast: 45 cc   Fluoroscopy Time: 2.9 minutes  Indications:  The patient presents to [facility] with atherosclerotic occlusive disease bilateral lower extremities with ischemic changes to the left forefoot and toes extending up to the ankle.  Pedal pulses are nonpalpable bilaterally suggesting hemodynamically significant atherosclerotic occlusive disease.  The risks and benefits as well as alternative therapies for lower extremity revascularization are reviewed with the patient all questions are answered the patient agrees to proceed.  The patient is therefore undergoing angiography with the hope for intervention for limb salvage.   Procedure:  Penny Harlin Simpsonis a 53 y.o. female who was identified and appropriate procedural time out was performed.  The patient was then placed supine on the table and prepped and draped in the usual sterile fashion.  Ultrasound was used to evaluate the right common femoral artery.  It was echolucent and pulsatile  indicating it is patent .  An ultrasound image was acquired for the permanent record.  A micropuncture needle was used to access the right common femoral artery under direct ultrasound guidance.  The microwire was then advanced under fluoroscopic guidance without difficulty followed by the micro-sheath.  A 0.035 J wire was advanced without resistance and a 5Fr sheath was placed.    Pigtail catheter was then advanced to the level of T12 and AP projection of the aorta was obtained. Pigtail catheter was then repositioned to above the bifurcation and RAO and LAO view of the pelvis was obtained. Stiff angled Glidewire and rim catheter was then used across the bifurcation and the catheter was positioned in the distal external iliac artery.  LAO of the left groin was then obtained. Wire was reintroduced and negotiated into the SFA and the catheter was advanced into the SFA. Distal runoff was then performed.  After review of the images the catheter was removed over wire and an RAO view of the groin was obtained. StarClose device was deployed without difficulty.   Findings:   Aortogram: The abdominal aorta is opacified with a bolus injection contrast.  Single renal arteries are noted bilaterally with normal nephrograms.  No evidence of hemodynamically significant renal artery stenosis.  There is marked narrowing with ulceration in the distal aorta at the level of the iliac bifurcation.  This is not mentioned in the CT angio report.  The origins of common iliac arteries bilaterally appear to be involved distal to the origins of the bilateral common internal and external iliac arteries are free of hemodynamically significant lesions.  Left lower Extremity: The left common femoral is widely patent there is no evidence of atherosclerotic changes, the profunda femoris demonstrates multiple areas of thrombus in  the more distal branches which appears to be partially occlusive.  The superficial femoral artery has a very  focal 40 to 50% narrowing that is noted in both AP as well as a steep LAO projection just proximal to Hunter's canal.  The popliteal artery is widely patent.  Trifurcation is patent.  The anterior tibial occludes in its midportion there is no reconstitution nor is there reconstitution of the dorsalis pedis.  The posterior tibial occludes at its distal one third, there is no distal reconstitution nor is there any reconstitution of the plantar arteries or filling of the pedal arch.  The peroneal occludes in its midportion.  There is nonvisualization of any named branches in the forefoot.  There is no distal target present.  SUMMARY: Based on these images no intervention is performed at this time.    Disposition: Patient was taken to the recovery room in stable condition having tolerated the procedure well.  Penny Chase 11/24/2023,5:20 PM

## 2023-11-24 NOTE — Consult Note (Signed)
 PHARMACY - ANTICOAGULATION CONSULT NOTE  Pharmacy Consult for Heparin infusion Indication: PE and low extremity ischemia  Allergies  Allergen Reactions   Amlodipine Swelling   Patient Measurements: Height: 5\' 4"  (162.6 cm) Weight: 81.6 kg (180 lb) IBW/kg (Calculated) : 54.7 HEPARIN DW (KG): 72.4  Vital Signs: Temp: 100 F (37.8 C) (04/10 0845) BP: 120/74 (04/10 0845) Pulse Rate: 86 (04/10 0845)  Labs: Recent Labs    11/23/23 0949 11/23/23 1441 11/23/23 2349 11/24/23 0254 11/24/23 0832 11/24/23 0944  HGB 11.4*   < > 9.9* 9.5* 9.6*  --   HCT 35.2*   < > 29.1* 28.6* 28.8*  --   PLT 885*  --   --   --   --   --   APTT 30  --   --  34  --  42*  LABPROT 14.8  --   --   --   --   --   INR 1.1  --   --   --   --   --   HEPARINUNFRC  --   --   --  0.78*  --   --   CREATININE 0.78  --   --  0.68  --   --    < > = values in this interval not displayed.   Estimated Creatinine Clearance: 83.1 mL/min (by C-G formula based on SCr of 0.68 mg/dL).  Medical History: Past Medical History:  Diagnosis Date   Breast cancer (HCC)    Cancer (HCC) 09/2018   Right breast   COVID-19 11/2018   Family history of breast cancer    Hypertension    Personal history of radiation therapy 2020   Rt breast    Medications:  Apixaban 5mg  po BID - last dose 4/9 @ 0730 per med hx  Assessment: 53 yo fe,ale patient with PMH of hypertension, GERD, bipolar disorder, breast cancer, recent admission to [facility] for PE and arterial occlusion of left lower extremity. Present to ED with foot pain and blood in stools.  Pharmacy consulted to initiate heparin infusion per vascular provider.  Per Dr Gilda Crease: give 1/2 the calculated bolus and OK to follow nomogram thereafter.  Noted last apixaban given > 12 hours ago. aPTT 30, INR 1.1, hgb 11.4 Diverticulitis found on CT.  4/10 @ 0254:  aPTT = 34,  HL = 0.78   SUBtherapeutic, not correlating  Goal of Therapy:  Heparin level 0.3-0.7 units/ml aPTT 66-102  seconds Monitor platelets by anticoagulation protocol: Yes   Plan:  4/10 @0944 :  aPTT =42   SUBtherapeutic - will increase to 1400 units/hr and order heparin 2200 units IV X 1 bolus - recheck aPTT level 6 hours after rate change  - recheck HL on 4/11 with AM labs  Will continue to follow aPTT until correlating with HL Continue to monitor H&H and platelets  Bari Mantis PharmD Clinical Pharmacist 11/24/2023

## 2023-11-24 NOTE — Consult Note (Signed)
 PHARMACY - ANTICOAGULATION CONSULT NOTE  Pharmacy Consult for Heparin infusion Indication: PE and low extremity ischemia  Allergies  Allergen Reactions   Amlodipine Swelling   Patient Measurements: Height: 5\' 4"  (162.6 cm) Weight: 81.6 kg (180 lb) IBW/kg (Calculated) : 54.7 HEPARIN DW (KG): 72.4  Vital Signs: Temp: 99.2 F (37.3 C) (04/10 0311) BP: 126/76 (04/10 0311) Pulse Rate: 76 (04/10 0311)  Labs: Recent Labs    11/23/23 0949 11/23/23 1441 11/23/23 2036 11/23/23 2349 11/24/23 0254  HGB 11.4*   < > 10.3* 9.9* 9.5*  HCT 35.2*   < > 30.8* 29.1* 28.6*  PLT 885*  --   --   --   --   APTT 30  --   --   --  34  LABPROT 14.8  --   --   --   --   INR 1.1  --   --   --   --   HEPARINUNFRC  --   --   --   --  0.78*  CREATININE 0.78  --   --   --  0.68   < > = values in this interval not displayed.   Estimated Creatinine Clearance: 83.1 mL/min (by C-G formula based on SCr of 0.68 mg/dL).  Medical History: Past Medical History:  Diagnosis Date   Breast cancer (HCC)    Cancer (HCC) 09/2018   Right breast   COVID-19 11/2018   Family history of breast cancer    Hypertension    Personal history of radiation therapy 2020   Rt breast    Medications:  Apixaban 5mg  po BID - last dose 4/9 @ 0730 per med hx  Assessment: 53 yo fe,ale patient with PMH of hypertension, GERD, bipolar disorder, breast cancer, recent admission to [facility] for PE and arterial occlusion of left lower extremity. Present to ED with foot pain and blood in stools.  Pharmacy consulted to initiate heparin infusion per vascular provider.  Per Dr Gilda Crease: give 1/2 the calculated bolus and OK to follow nomogram thereafter.  Noted last apixaban given > 12 hours ago. aPTT 30, INR 1.1, hgb 11.4 Diverticulitis found on CT.  Goal of Therapy:  Heparin level 0.3-0.7 units/ml aPTT 66-102 seconds Monitor platelets by anticoagulation protocol: Yes   Plan:  4/10 @ 0254:  aPTT = 34,  HL = 0.78 - aPTT  SUBtherapeutic (barely above baseline), not correlating with HL  - heparin previous rate appears to have been underdosed;  will increase to 1200 units/hr (16 units/kg HDW) and order heparin 2200 units IV X 1 bolus - recheck aPTT level 6 hours after rate change  - recheck HL on 4/11 with AM labs  Will continue to follow aPTT until correlating with HL Continue to monitor H&H and platelets  Reve Crocket D 11/24/2023 3:40 AM

## 2023-11-24 NOTE — Progress Notes (Signed)
 Progress Note    11/24/2023 4:02 PM * Day of Surgery *  Subjective:  Penny Chase is a 53 y.o. female hypertension, GERD, bipolar disorder, breast cancer, recent admission to [facility] for PE and arterial occlusion of left lower extremity presents for evaluation of foot pain and blood in stools. Patient endorses that yesterday evening she noticed development of some blisters on the dorsum of her left foot, no increased pain, no increased redness, no fevers or chills.   Upon exam this morning patient is resting comfortably in bed.  No changes in her left lower extremity since seen yesterday.  No complaints overnight.  She still endorses pain to her foot.  Vitals are remained stable. Vitals:   11/24/23 0845 11/24/23 1502  BP: 120/74 113/73  Pulse: 86 71  Resp: 18 12  Temp: 100 F (37.8 C) 98 F (36.7 C)  SpO2: 93% 98%   Physical Exam: Cardiac:  RRR, normal S1 and S2.  No rubs clicks or gallops noted. Lungs: Lungs clear throughout on auscultation.  No rales rhonchi or wheezing to noted.  Normal nonlabored breathing. Incisions: Left lower extremity with medial and lateral calf incisions closed with Dermabond glue.  Left groin incision with staples clean dry and intact.  Open to air. Extremities: Right lower extremity warm to touch with palpable pulses.  Left lower extremity with multiple surgical incisions from 11/14/2023.  Right foot with noted dorsal blisters and purple forefoot with gangrenous changes.  Doppler PT pulse only.  Forefoot and toes are  cold to touch. Abdomen: Positive bowel sounds throughout, soft, nontender nondistended. Neurologic: Third and oriented.  Cranial nerves completely intact.  CBC    Component Value Date/Time   WBC 23.8 (H) 11/24/2023 1047   RBC 3.21 (L) 11/24/2023 1047   HGB 10.2 (L) 11/24/2023 1047   HCT 30.4 (L) 11/24/2023 1047   PLT 875 (H) 11/24/2023 1047   MCV 94.7 11/24/2023 1047   MCH 31.8 11/24/2023 1047   MCHC 33.6 11/24/2023 1047   RDW 15.9 (H)  11/24/2023 1047   LYMPHSABS 2.0 11/24/2023 1047   MONOABS 1.5 (H) 11/24/2023 1047   EOSABS 0.6 (H) 11/24/2023 1047   BASOSABS 0.3 (H) 11/24/2023 1047    BMET    Component Value Date/Time   NA 133 (L) 11/24/2023 0254   K 3.8 11/24/2023 0254   CL 102 11/24/2023 0254   CO2 21 (L) 11/24/2023 0254   GLUCOSE 124 (H) 11/24/2023 0254   BUN 15 11/24/2023 0254   CREATININE 0.68 11/24/2023 0254   CALCIUM 8.3 (L) 11/24/2023 0254   GFRNONAA >60 11/24/2023 0254   GFRAA >60 05/31/2016 1152    INR    Component Value Date/Time   INR 1.1 11/23/2023 0949     Intake/Output Summary (Last 24 hours) at 11/24/2023 1602 Last data filed at 11/24/2023 0600 Gross per 24 hour  Intake 1371.55 ml  Output --  Net 1371.55 ml     Assessment/Plan:  53 y.o. female is s/p left lower extremity embolectomy/thrombectomy on 11/14/2023 at [facility].* Day of Surgery *   Plan Vascular surgery plans on taking the patient to the vascular lab today on 11/24/2023 for left lower extremity angiogram with possible intervention.  I discussed in detail the procedure, benefits, risk, complications.  Patient verbalized understanding wishes to proceed.  Patient has been made n.p.o. I answered all the patient's questions.  We discussed in detail today as well that the findings from the angiogram today may help dictate care going forward  and if she needs surgery possibly develop a surgical plan.  I discussed the case in detail with Dr. Levora Dredge MD and he agrees with the plan.  DVT prophylaxis: Heparin infusion.   Marcie Bal [facility] 11/24/2023 4:02 PM

## 2023-11-25 ENCOUNTER — Encounter: Payer: Self-pay | Admitting: Vascular Surgery

## 2023-11-25 DIAGNOSIS — I998 Other disorder of circulatory system: Secondary | ICD-10-CM | POA: Diagnosis not present

## 2023-11-25 LAB — CBC
HCT: 27.4 % — ABNORMAL LOW (ref 36.0–46.0)
Hemoglobin: 9.3 g/dL — ABNORMAL LOW (ref 12.0–15.0)
MCH: 32.4 pg (ref 26.0–34.0)
MCHC: 33.9 g/dL (ref 30.0–36.0)
MCV: 95.5 fL (ref 80.0–100.0)
Platelets: 838 10*3/uL — ABNORMAL HIGH (ref 150–400)
RBC: 2.87 MIL/uL — ABNORMAL LOW (ref 3.87–5.11)
RDW: 15.7 % — ABNORMAL HIGH (ref 11.5–15.5)
WBC: 21.6 10*3/uL — ABNORMAL HIGH (ref 4.0–10.5)
nRBC: 0 % (ref 0.0–0.2)

## 2023-11-25 LAB — HEPARIN LEVEL (UNFRACTIONATED)
Heparin Unfractionated: 0.54 [IU]/mL (ref 0.30–0.70)
Heparin Unfractionated: 0.85 [IU]/mL — ABNORMAL HIGH (ref 0.30–0.70)

## 2023-11-25 LAB — APTT
aPTT: 42 s — ABNORMAL HIGH (ref 24–36)
aPTT: 72 s — ABNORMAL HIGH (ref 24–36)
aPTT: 59 s — ABNORMAL HIGH (ref 24–36)
aPTT: 86 s — ABNORMAL HIGH (ref 24–36)

## 2023-11-25 LAB — BASIC METABOLIC PANEL WITH GFR
Anion gap: 8 (ref 5–15)
BUN: 15 mg/dL (ref 6–20)
CO2: 24 mmol/L (ref 22–32)
Calcium: 8.4 mg/dL — ABNORMAL LOW (ref 8.9–10.3)
Chloride: 109 mmol/L (ref 98–111)
Creatinine, Ser: 0.73 mg/dL (ref 0.44–1.00)
GFR, Estimated: >60 mL/min (ref >60)
Glucose, Bld: 118 mg/dL — ABNORMAL HIGH (ref 70–99)
Potassium: 4.2 mmol/L (ref 3.5–5.1)
Sodium: 137 mmol/L (ref 135–145)

## 2023-11-25 LAB — MAGNESIUM: Magnesium: 2.2 mg/dL (ref 1.7–2.4)

## 2023-11-25 MED ORDER — MORPHINE SULFATE (PF) 2 MG/ML IV SOLN
2.0000 mg | Freq: Once | INTRAVENOUS | Status: AC
  Administered 2023-11-25: 2 mg via INTRAVENOUS
  Filled 2023-11-25: qty 1

## 2023-11-25 MED ORDER — CEFAZOLIN SODIUM-DEXTROSE 2-4 GM/100ML-% IV SOLN
2.0000 g | INTRAVENOUS | Status: AC
  Administered 2023-11-26: 2 g via INTRAVENOUS
  Filled 2023-11-25: qty 100

## 2023-11-25 MED ORDER — CHLORHEXIDINE GLUCONATE 4 % EX SOLN
60.0000 mL | Freq: Once | CUTANEOUS | Status: AC
  Administered 2023-11-25: 4 via TOPICAL

## 2023-11-25 MED ORDER — SODIUM CHLORIDE 0.9 % IV SOLN
INTRAVENOUS | Status: DC
Start: 2023-11-25 — End: 2023-11-26

## 2023-11-25 MED ORDER — HEPARIN BOLUS VIA INFUSION
2200.0000 [IU] | Freq: Once | INTRAVENOUS | Status: AC
  Administered 2023-11-25: 2200 [IU] via INTRAVENOUS
  Filled 2023-11-25: qty 2200

## 2023-11-25 MED ORDER — HYDROCODONE-ACETAMINOPHEN 5-325 MG PO TABS
2.0000 | ORAL_TABLET | ORAL | Status: DC | PRN
  Administered 2023-11-25: 2 via ORAL
  Administered 2023-11-25: 3 via ORAL
  Filled 2023-11-25 (×2): qty 3

## 2023-11-25 MED ORDER — PRAVASTATIN SODIUM 20 MG PO TABS
10.0000 mg | ORAL_TABLET | Freq: Every day | ORAL | Status: DC
  Administered 2023-11-25 – 2023-11-29 (×5): 10 mg via ORAL
  Filled 2023-11-25 (×6): qty 1

## 2023-11-25 MED ORDER — OXYCODONE HCL 5 MG PO TABS
5.0000 mg | ORAL_TABLET | Freq: Four times a day (QID) | ORAL | Status: DC | PRN
  Administered 2023-11-26 (×2): 5 mg via ORAL
  Filled 2023-11-25 (×2): qty 1

## 2023-11-25 MED ORDER — HEPARIN BOLUS VIA INFUSION
1000.0000 [IU] | Freq: Once | INTRAVENOUS | Status: AC
  Administered 2023-11-25: 1000 [IU] via INTRAVENOUS
  Filled 2023-11-25: qty 1000

## 2023-11-25 MED ORDER — CHLORHEXIDINE GLUCONATE 4 % EX SOLN
60.0000 mL | Freq: Once | CUTANEOUS | Status: AC
  Administered 2023-11-26: 4 via TOPICAL

## 2023-11-25 NOTE — H&P (View-Only) (Signed)
 Sat with the patient and reviewed our upcoming plans.  We will move forward with left below-knee amputation tomorrow.  We will plan for distal aortic stenting with coverage of the common iliac arteries in 4 to 6 weeks once the left groin has healed.  I will plan to take out the staples in approximately 1 week.  The patient voiced appropriate concerns and states she is prepared.  She is concerned regarding phantom pains.  She has agreed to move forward tomorrow as planned.

## 2023-11-25 NOTE — Progress Notes (Signed)
 Sat with the patient and reviewed our upcoming plans.  We will move forward with left below-knee amputation tomorrow.  We will plan for distal aortic stenting with coverage of the common iliac arteries in 4 to 6 weeks once the left groin has healed.  I will plan to take out the staples in approximately 1 week.  The patient voiced appropriate concerns and states she is prepared.  She is concerned regarding phantom pains.  She has agreed to move forward tomorrow as planned.

## 2023-11-25 NOTE — Consult Note (Addendum)
 PHARMACY - ANTICOAGULATION CONSULT NOTE  Pharmacy Consult for Heparin infusion Indication: PE and low extremity ischemia  Allergies  Allergen Reactions   Amlodipine Swelling   Patient Measurements: Height: 5\' 4"  (162.6 cm) Weight: 81.6 kg (180 lb) IBW/kg (Calculated) : 54.7 HEPARIN DW (KG): 72.4  Vital Signs: Temp: 98.9 F (37.2 C) (04/10 2121) Temp Source: Oral (04/10 2121) BP: 121/68 (04/10 2121) Pulse Rate: 89 (04/10 2121)  Labs: Recent Labs    11/23/23 0949 11/23/23 1441 11/24/23 0254 11/24/23 0832 11/24/23 0944 11/24/23 1047 11/25/23 0000  HGB 11.4*   < > 9.5* 9.6*  --  10.2*  --   HCT 35.2*   < > 28.6* 28.8*  --  30.4*  --   PLT 885*  --   --   --   --  875*  --   APTT 30  --  34  --  42*  --   --   LABPROT 14.8  --   --   --   --   --   --   INR 1.1  --   --   --   --   --   --   HEPARINUNFRC  --   --  0.78*  --   --   --  0.54  CREATININE 0.78  --  0.68  --   --   --   --    < > = values in this interval not displayed.   Estimated Creatinine Clearance: 83.1 mL/min (by C-G formula based on SCr of 0.68 mg/dL).  Medical History: Past Medical History:  Diagnosis Date   Breast cancer (HCC)    Cancer (HCC) 09/2018   Right breast   COVID-19 11/2018   Family history of breast cancer    Hypertension    Personal history of radiation therapy 2020   Rt breast    Medications:  Apixaban 5mg  po BID - last dose 4/9 @ 0730 per med hx  Assessment: 53 yo fe,ale patient with PMH of hypertension, GERD, bipolar disorder, breast cancer, recent admission to [facility] for PE and arterial occlusion of left lower extremity. Present to ED with foot pain and blood in stools.  Pharmacy consulted to initiate heparin infusion per vascular provider.  Per Dr Gilda Crease: give 1/2 the calculated bolus and OK to follow nomogram thereafter.  Noted last apixaban given > 12 hours ago. aPTT 30, INR 1.1, hgb 11.4 Diverticulitis found on CT.  4/10 @ 0254:  aPTT = 34,  HL = 0.78    SUBtherapeutic, not correlating  4/11 @ 0000:  aPTT =  42,  HL = 0.54  SUBtherapeutic, not correlating   Goal of Therapy:  Heparin level 0.3-0.7 units/ml aPTT 66-102 seconds Monitor platelets by anticoagulation protocol: Yes   Plan:  4/11 @ 0000:  aPTT = 42,  HL = 0.54 - aPTT SUBtherapeutic, not correlating with HL  - will order heparin 2200 units IV X 1 bolus and increase drip rate to 1650 units/hr - recheck aPTT level 6 hours after rate change  - recheck HL on 4/12 with AM labs  Will continue to follow aPTT until correlating with HL Continue to monitor H&H and platelets  Jeramie Scogin D Clinical Pharmacist 11/25/2023

## 2023-11-25 NOTE — Progress Notes (Signed)
 Progress Note    11/25/2023 7:30 AM 1 Day Post-Op  Subjective:  Penny Chase is a 53 y.o. female hypertension, GERD, bipolar disorder, breast cancer, recent admission to [facility] for PE and arterial occlusion of left lower extremity presents for evaluation of foot pain and blood in stools. Patient endorses that yesterday evening she noticed development of some blisters on the dorsum of her left foot,   Patient is now POD #1 from left lower extremity angiogram. On exam this morning the patient is resting comfortably in bed. No changes in her left lower extremity since seen yesterday. No complaints overnight. She still endorses pain to her foot. Vitals are remained stable.   Vitals:   11/24/23 2121 11/25/23 0655  BP: 121/68 126/77  Pulse: 89 81  Resp: 16 15  Temp: 98.9 F (37.2 C) 97.7 F (36.5 C)  SpO2: 95% 98%   Physical Exam: Cardiac:  RRR, normal S1 and S2.  No rubs clicks or gallops noted. Lungs: Lungs clear throughout on auscultation.  No rales rhonchi or wheezing to noted.  Normal nonlabored breathing. Incisions: Left lower extremity with medial and lateral calf incisions closed with Dermabond glue.  Left groin incision with staples clean dry and intact.  Open to air. Extremities: Right lower extremity warm to touch with palpable pulses.  Left lower extremity with multiple surgical incisions from 11/14/2023.  Right foot with noted dorsal blisters and purple forefoot with gangrenous changes.  Doppler PT pulse only.  Forefoot and toes are  cold to touch. Abdomen: Positive bowel sounds throughout, soft, nontender nondistended. Neurologic: Third and oriented.  Cranial nerves completely intact.  CBC    Component Value Date/Time   WBC 23.8 (H) 11/24/2023 1047   RBC 3.21 (L) 11/24/2023 1047   HGB 10.2 (L) 11/24/2023 1047   HCT 30.4 (L) 11/24/2023 1047   PLT 875 (H) 11/24/2023 1047   MCV 94.7 11/24/2023 1047   MCH 31.8 11/24/2023 1047   MCHC 33.6 11/24/2023 1047   RDW 15.9 (H)  11/24/2023 1047   LYMPHSABS 2.0 11/24/2023 1047   MONOABS 1.5 (H) 11/24/2023 1047   EOSABS 0.6 (H) 11/24/2023 1047   BASOSABS 0.3 (H) 11/24/2023 1047    BMET    Component Value Date/Time   NA 133 (L) 11/24/2023 0254   K 3.8 11/24/2023 0254   CL 102 11/24/2023 0254   CO2 21 (L) 11/24/2023 0254   GLUCOSE 124 (H) 11/24/2023 0254   BUN 15 11/24/2023 0254   CREATININE 0.68 11/24/2023 0254   CALCIUM 8.3 (L) 11/24/2023 0254   GFRNONAA >60 11/24/2023 0254   GFRAA >60 05/31/2016 1152    INR    Component Value Date/Time   INR 1.1 11/23/2023 0949     Intake/Output Summary (Last 24 hours) at 11/25/2023 0730 Last data filed at 11/25/2023 9629 Gross per 24 hour  Intake 240 ml  Output 550 ml  Net -310 ml     Assessment/Plan:  53 y.o. female is s/p  left lower extremity angiogram. 1 Day Post-Op  Patient noted to have an anterior tibial artery that occludes in its midportion and there is no reconstitution nor is the reconstitution of the dorsalis pedis artery.  The posterior tibial occludes at its distal one third and there is no distal reconstitution or there are any reconstitution of the plantar arteries or filling of the pedal arch.  And the peroneal artery occludes at its midportion.  There is nonvisualization of any named branches in the forefoot with no distal target  present.   PLAN Vascular surgery recommends a below the knee amputation of the left lower extremity.  I had a long detailed conversation with the patient this morning regarding left lower extremity below the knee amputation.  We discussed in detail the procedure, benefits, risk, and complications.  Patient verbalizes her understanding and wishes to proceed as soon as possible due to the pain she is having.  Answered all the patient's questions this morning.  I instructed her she will be made n.p.o. after midnight tonight for surgery tomorrow.  And she verbalizes her understanding.  Patient is currently on a heparin infusion  and will continue until preop tomorrow.  DVT prophylaxis: Heparin infusion   Marcie Bal [facility] 11/25/2023 7:30 AM

## 2023-11-25 NOTE — Consult Note (Signed)
 PHARMACY - ANTICOAGULATION CONSULT NOTE  Pharmacy Consult for Heparin infusion Indication: PE and low extremity ischemia  Allergies  Allergen Reactions   Amlodipine Swelling   Patient Measurements: Height: 5\' 4"  (162.6 cm) Weight: 81.6 kg (180 lb) IBW/kg (Calculated) : 54.7 HEPARIN DW (KG): 72.4  Vital Signs: Temp: 97.7 F (36.5 C) (04/11 0655) Temp Source: Oral (04/11 0655) BP: 126/77 (04/11 0655) Pulse Rate: 81 (04/11 0655)  Labs: Recent Labs    11/23/23 0949 11/23/23 1441 11/24/23 0254 11/24/23 0832 11/24/23 0944 11/24/23 1047 11/25/23 0000 11/25/23 0816  HGB 11.4*   < > 9.5* 9.6*  --  10.2*  --  9.3*  HCT 35.2*   < > 28.6* 28.8*  --  30.4*  --  27.4*  PLT 885*  --   --   --   --  875*  --  838*  APTT 30  --  34  --  42*  --  42* 72*  LABPROT 14.8  --   --   --   --   --   --   --   INR 1.1  --   --   --   --   --   --   --   HEPARINUNFRC  --   --  0.78*  --   --   --  0.54 0.85*  CREATININE 0.78  --  0.68  --   --   --   --  0.73   < > = values in this interval not displayed.   Estimated Creatinine Clearance: 83.1 mL/min (by C-G formula based on SCr of 0.73 mg/dL).  Medical History: Past Medical History:  Diagnosis Date   Breast cancer (HCC)    Cancer (HCC) 09/2018   Right breast   COVID-19 11/2018   Family history of breast cancer    Hypertension    Personal history of radiation therapy 2020   Rt breast    Medications:  Apixaban 5mg  po BID - last dose 4/9 @ 0730 per med hx  Assessment: 53 yo fe,ale patient with PMH of hypertension, GERD, bipolar disorder, breast cancer, recent admission to [facility] for PE and arterial occlusion of left lower extremity. Present to ED with foot pain and blood in stools. Pharmacy consulted to initiate heparin infusion per vascular provider.  Per Dr Gilda Crease: give 1/2 the calculated bolus and OK to follow nomogram thereafter.  Noted last apixaban given > 12 hours ago. aPTT 30, INR 1.1, hgb 11.4 Diverticulitis found on  CT.  4/10 @ 0254: aPTT = 34, HL = 0.78  SUBtherapeutic, not correlating 4/11 @ 0000: aPTT = 42, HL = 0.54  SUBtherapeutic, not correlating  4/11 @ 0816: aPTT = 72, HL = 0.85  Therapeutic x 1, not correlating  Goal of Therapy:  Heparin level 0.3-0.7 units/ml aPTT 66-102 seconds Monitor platelets by anticoagulation protocol: Yes   Plan:  Continue heparin at 1650 un/hr Recheck confirmatory aPTT in 6 hours Check heparin level with tomorrow AM labs to assess for correlation CBC daily while on heparin dri  Will M. Dareen Piano, PharmD Clinical Pharmacist 11/25/2023 10:04 AM

## 2023-11-25 NOTE — Progress Notes (Signed)
  PROGRESS NOTE    Penny Chase  ZOX:[id number] DOB: 07/09/1971 DOA: 11/23/2023 PCP: [facility]  141A/141A-AA  LOS: 1 day   Brief hospital course:   Assessment & Plan: Penny Chase is a 52 y.o. female with medical history significant of PE, hypertension, tobacco abuse, substance abuse, history of right breast cancer presenting with left lower extremity ischemia, GI bleeding, diverticulitis.  Patient reports worsening left foot pain redness and swelling over the past 1 to 2 days.  Noted to have been recently evaluated in the [facility] system March 29 through April 7 with issues including multiple subsegmental PEs, lower extremity arterial occlusion status post thrombectomy.    * Limb ischemia Gangrene of left foot Worsening left lower extremity distal foot redness and discoloration concerning for limb ischemia and dry gangrene Noted recent admission in the [facility] system for similar issues with patient refusing amputation. Procedures included iliac thrombus in addition to thrombus in the AT and PT. S/p L CFA/AT/PT cutdown, L AT/PT thromboembolectomy 11/14/23-patient refused amputation --per podiatry, no need for abx --angiogram on 4/10 with no intervention preformed --plan for left BKA tomorrow  PAD --per vascular, "plan for distal aortic stenting with coverage of the common iliac arteries in 4 to 6 weeks once the left groin has healed." --cont ASA and statin  PE (pulmonary thromboembolism) (HCC) Noted recent diagnosis in the [facility] system with PE --hold home Eliquis --cont heparin gtt for now  Leukocytosis Marked white count of 28 on presentation Suspect multifactorial with reactive component given multiple recent vascular procedures as well as suspected diverticulitis on CT scan  Lower GI bleeding Noted bloody bowel movements x 12 to 24 hours in the setting of anticoagulation use CT imaging with noted diverticulitis --monitor Hgb  Substance abuse (HCC) Pt self admits  to MJ use  Tobacco abuse 1 pack/day smoker Discussed cessation at length --cont nicotine patch and gum  HTN (hypertension), benign BP stable   DVT prophylaxis: [id number] gtt Code Status: Full code  Family Communication:  Level of care: Med-Surg Dispo:   The patient is from: home Anticipated d/c is to: home Anticipated d/c date is: > 3 days   Subjective and Interval History:  Foot pain uncontrolled, pain med increased.   Objective: Vitals:   11/24/23 1730 11/24/23 1745 11/24/23 2121 11/25/23 0655  BP: (!) 121/100 126/85 121/68 126/77  Pulse: 74 69 89 81  Resp: 15 14 16 15   Temp:   98.9 F (37.2 C) 97.7 F (36.5 C)  TempSrc:   Oral Oral  SpO2: 99% 99% 95% 98%  Weight:      Height:        Intake/Output Summary (Last 24 hours) at 11/25/2023 1935 Last data filed at 11/25/2023 1500 Gross per 24 hour  Intake 588.11 ml  Output 550 ml  Net 38.11 ml   Filed Weights   11/23/23 0902  Weight: 81.6 kg    Examination:   Constitutional: NAD, AAOx3 HEENT: conjunctivae and lids normal, EOMI CV: No cyanosis.   RESP: normal respiratory effort, on RA Neuro: II - XII grossly intact.   Psych: Normal mood and affect.  Appropriate judgement and reason   Data Reviewed: I have personally reviewed labs and imaging studies  Time spent: 50 minutes  Darlin Priestly, MD [facility] If 7PM-7AM, please contact night-coverage 11/25/2023, 7:35 PM

## 2023-11-25 NOTE — Plan of Care (Signed)
   Problem: Education: Goal: Knowledge of General Education information will improve Description: Including pain rating scale, medication(s)/side effects and non-pharmacologic comfort measures Outcome: Progressing   Problem: Activity: Goal: Risk for activity intolerance will decrease Outcome: Progressing   Problem: Nutrition: Goal: Adequate nutrition will be maintained Outcome: Progressing   Problem: Coping: Goal: Level of anxiety will decrease Outcome: Progressing

## 2023-11-25 NOTE — Consult Note (Signed)
 PHARMACY - ANTICOAGULATION CONSULT NOTE  Pharmacy Consult for Heparin infusion Indication: PE and low extremity ischemia  Allergies  Allergen Reactions   Amlodipine Swelling   Patient Measurements: Height: 5\' 4"  (162.6 cm) Weight: 81.6 kg (180 lb) IBW/kg (Calculated) : 54.7 HEPARIN DW (KG): 72.4  Vital Signs: Temp: 97.7 F (36.5 C) (04/11 0655) Temp Source: Oral (04/11 0655) BP: 126/77 (04/11 0655) Pulse Rate: 81 (04/11 0655)  Labs: Recent Labs    11/23/23 0949 11/23/23 1441 11/24/23 0254 11/24/23 0832 11/24/23 0944 11/24/23 1047 11/25/23 0000 11/25/23 0816 11/25/23 1427  HGB 11.4*   < > 9.5* 9.6*  --  10.2*  --  9.3*  --   HCT 35.2*   < > 28.6* 28.8*  --  30.4*  --  27.4*  --   PLT 885*  --   --   --   --  875*  --  838*  --   APTT 30  --  34  --    < >  --  42* 72* 59*  LABPROT 14.8  --   --   --   --   --   --   --   --   INR 1.1  --   --   --   --   --   --   --   --   HEPARINUNFRC  --   --  0.78*  --   --   --  0.54 0.85*  --   CREATININE 0.78  --  0.68  --   --   --   --  0.73  --    < > = values in this interval not displayed.   Estimated Creatinine Clearance: 83.1 mL/min (by C-G formula based on SCr of 0.73 mg/dL).  Medical History: Past Medical History:  Diagnosis Date   Breast cancer (HCC)    Cancer (HCC) 09/2018   Right breast   COVID-19 11/2018   Family history of breast cancer    Hypertension    Personal history of radiation therapy 2020   Rt breast    Medications:  Apixaban 5mg  po BID - last dose 4/9 @ 0730 per med hx  Assessment: 53 yo fe,ale patient with PMH of hypertension, GERD, bipolar disorder, breast cancer, recent admission to [facility] for PE and arterial occlusion of left lower extremity. Present to ED with foot pain and blood in stools. Pharmacy consulted to initiate heparin infusion per vascular provider. Per their note on 4/11, patient will undergo LLE BKA on 4/12.  Per Dr Gilda Crease: give 1/2 the calculated bolus and OK to follow  nomogram thereafter.  Noted last apixaban given > 12 hours ago. aPTT 30, INR 1.1, hgb 11.4 Diverticulitis found on CT.  4/10 @ 0254: aPTT = 34, HL = 0.78  SUBtherapeutic, not correlating 4/11 @ 0000: aPTT = 42, HL = 0.54  SUBtherapeutic, not correlating  4/11 @ 0816: aPTT = 72, HL = 0.85  Therapeutic x 1, not correlating 4/11 @ 1427: aPTT = 59   SUBtherapeutic  Goal of Therapy:  Heparin level 0.3-0.7 units/ml aPTT 66-102 seconds Monitor platelets by anticoagulation protocol: Yes   Plan:  Give 1000 units bolus x1; then increase rate of heparin infusion to 1800 units/hour. Check aPTT in 6 hours, then daily once at least two levels are consecutively therapeutic. Heparin level with AM labs tomorrow to assess for correlation. Continue to monitor CBC daily while on heparin infusion.  Will M. Dareen Piano, PharmD Clinical Pharmacist  11/25/2023 3:27 PM  .Jackelyn Knife

## 2023-11-26 ENCOUNTER — Inpatient Hospital Stay

## 2023-11-26 ENCOUNTER — Encounter
Admission: EM | Disposition: A | Discharge status: Home Health | Payer: Self-pay | Source: Home / Self Care | Attending: Internal Medicine

## 2023-11-26 ENCOUNTER — Other Ambulatory Visit: Payer: Self-pay

## 2023-11-26 ENCOUNTER — Inpatient Hospital Stay: Admitting: Anesthesiology

## 2023-11-26 DIAGNOSIS — R197 Diarrhea, unspecified: Secondary | ICD-10-CM

## 2023-11-26 DIAGNOSIS — K625 Hemorrhage of anus and rectum: Secondary | ICD-10-CM | POA: Diagnosis not present

## 2023-11-26 DIAGNOSIS — K921 Melena: Secondary | ICD-10-CM

## 2023-11-26 DIAGNOSIS — D62 Acute posthemorrhagic anemia: Secondary | ICD-10-CM

## 2023-11-26 DIAGNOSIS — I96 Gangrene, not elsewhere classified: Secondary | ICD-10-CM | POA: Diagnosis not present

## 2023-11-26 HISTORY — PX: AMPUTATION: SHX166

## 2023-11-26 LAB — CBC
HCT: 25 % — ABNORMAL LOW (ref 36.0–46.0)
HCT: 19.8 % — ABNORMAL LOW (ref 36.0–46.0)
HCT: 23.8 % — ABNORMAL LOW (ref 36.0–46.0)
Hemoglobin: 8.3 g/dL — ABNORMAL LOW (ref 12.0–15.0)
Hemoglobin: 6.5 g/dL — ABNORMAL LOW (ref 12.0–15.0)
Hemoglobin: 8.1 g/dL — ABNORMAL LOW (ref 12.0–15.0)
MCH: 31.7 pg (ref 26.0–34.0)
MCH: 31.9 pg (ref 26.0–34.0)
MCH: 31.3 pg (ref 26.0–34.0)
MCHC: 33.2 g/dL (ref 30.0–36.0)
MCHC: 32.8 g/dL (ref 30.0–36.0)
MCHC: 34 g/dL (ref 30.0–36.0)
MCV: 95.4 fL (ref 80.0–100.0)
MCV: 97.1 fL (ref 80.0–100.0)
MCV: 91.9 fL (ref 80.0–100.0)
Platelets: 803 10*3/uL — ABNORMAL HIGH (ref 150–400)
Platelets: 589 10*3/uL — ABNORMAL HIGH (ref 150–400)
Platelets: 660 10*3/uL — ABNORMAL HIGH (ref 150–400)
RBC: 2.62 MIL/uL — ABNORMAL LOW (ref 3.87–5.11)
RBC: 2.04 MIL/uL — ABNORMAL LOW (ref 3.87–5.11)
RBC: 2.59 MIL/uL — ABNORMAL LOW (ref 3.87–5.11)
RDW: 15.8 % — ABNORMAL HIGH (ref 11.5–15.5)
RDW: 15.5 % (ref 11.5–15.5)
RDW: 16.7 % — ABNORMAL HIGH (ref 11.5–15.5)
WBC: 30.9 10*3/uL — ABNORMAL HIGH (ref 4.0–10.5)
WBC: 24.5 10*3/uL — ABNORMAL HIGH (ref 4.0–10.5)
WBC: 25.7 10*3/uL — ABNORMAL HIGH (ref 4.0–10.5)
nRBC: 0 % (ref 0.0–0.2)
nRBC: 0 % (ref 0.0–0.2)
nRBC: 0 % (ref 0.0–0.2)

## 2023-11-26 LAB — VITAMIN B12: Vitamin B-12: 238 pg/mL (ref 180–914)

## 2023-11-26 LAB — IRON AND TIBC
Iron: 23 ug/dL — ABNORMAL LOW (ref 28–170)
Saturation Ratios: 11 % (ref 10.4–31.8)
TIBC: 211 ug/dL — ABNORMAL LOW (ref 250–450)
UIBC: 188 ug/dL

## 2023-11-26 LAB — HEMOGLOBIN AND HEMATOCRIT, BLOOD
HCT: 25.6 % — ABNORMAL LOW (ref 36.0–46.0)
Hemoglobin: 8.6 g/dL — ABNORMAL LOW (ref 12.0–15.0)

## 2023-11-26 LAB — HEPARIN LEVEL (UNFRACTIONATED): Heparin Unfractionated: 0.78 [IU]/mL — ABNORMAL HIGH (ref 0.30–0.70)

## 2023-11-26 LAB — APTT: aPTT: 87 s — ABNORMAL HIGH (ref 24–36)

## 2023-11-26 LAB — FERRITIN: Ferritin: 535 ng/mL — ABNORMAL HIGH (ref 11–307)

## 2023-11-26 LAB — PREPARE RBC (CROSSMATCH)

## 2023-11-26 LAB — FOLATE: Folate: 3.7 ng/mL — ABNORMAL LOW (ref >5.9)

## 2023-11-26 LAB — ABO/RH: ABO/RH(D): A POS

## 2023-11-26 SURGERY — AMPUTATION BELOW KNEE
Anesthesia: General | Site: Knee | Laterality: Left

## 2023-11-26 MED ORDER — PROPOFOL 1000 MG/100ML IV EMUL
INTRAVENOUS | Status: AC
  Filled 2023-11-26: qty 100

## 2023-11-26 MED ORDER — BUPIVACAINE HCL (PF) 0.5 % IJ SOLN
INTRAMUSCULAR | Status: AC
  Filled 2023-11-26: qty 20

## 2023-11-26 MED ORDER — BUPIVACAINE LIPOSOME 1.3 % IJ SUSP
INTRAMUSCULAR | Status: AC
  Filled 2023-11-26: qty 20

## 2023-11-26 MED ORDER — TRAZODONE HCL 50 MG PO TABS
50.0000 mg | ORAL_TABLET | Freq: Once | ORAL | Status: AC
Start: 2023-11-27 — End: 2023-11-27
  Administered 2023-11-27: 50 mg via ORAL
  Filled 2023-11-26: qty 1

## 2023-11-26 MED ORDER — PANTOPRAZOLE SODIUM 40 MG PO TBEC
40.0000 mg | DELAYED_RELEASE_TABLET | Freq: Two times a day (BID) | ORAL | Status: DC
  Administered 2023-11-27: 40 mg via ORAL
  Filled 2023-11-26: qty 1

## 2023-11-26 MED ORDER — LACTATED RINGERS IV SOLN: INTRAVENOUS | Status: DC | PRN

## 2023-11-26 MED ORDER — DEXAMETHASONE SODIUM PHOSPHATE 10 MG/ML IJ SOLN
INTRAMUSCULAR | Status: DC | PRN
  Administered 2023-11-26: 5 mg via INTRAVENOUS

## 2023-11-26 MED ORDER — ACETAMINOPHEN 10 MG/ML IV SOLN
INTRAVENOUS | Status: DC | PRN
  Administered 2023-11-26: 1000 mg via INTRAVENOUS

## 2023-11-26 MED ORDER — ONDANSETRON HCL 4 MG/2ML IJ SOLN
INTRAMUSCULAR | Status: DC | PRN
  Administered 2023-11-26: 4 mg via INTRAVENOUS

## 2023-11-26 MED ORDER — BOOST / RESOURCE BREEZE PO LIQD CUSTOM
1.0000 | Freq: Three times a day (TID) | ORAL | Status: DC
  Administered 2023-11-26 – 2023-11-30 (×4): 1 via ORAL

## 2023-11-26 MED ORDER — MIDAZOLAM HCL 2 MG/2ML IJ SOLN
INTRAMUSCULAR | Status: DC | PRN
  Administered 2023-11-26: 2 mg via INTRAVENOUS

## 2023-11-26 MED ORDER — FENTANYL CITRATE (PF) 100 MCG/2ML IJ SOLN
INTRAMUSCULAR | Status: AC
  Filled 2023-11-26: qty 2

## 2023-11-26 MED ORDER — OXYCODONE HCL 5 MG PO TABS
10.0000 mg | ORAL_TABLET | ORAL | Status: DC | PRN
  Administered 2023-11-26 – 2023-11-30 (×20): 10 mg via ORAL
  Filled 2023-11-26 (×20): qty 2

## 2023-11-26 MED ORDER — SODIUM CHLORIDE (PF) 0.9 % IJ SOLN
INTRAMUSCULAR | Status: AC
Start: 2023-11-26 — End: ?
  Filled 2023-11-26: qty 50

## 2023-11-26 MED ORDER — SODIUM CHLORIDE 0.9% IV SOLUTION: Freq: Once | INTRAVENOUS | Status: AC

## 2023-11-26 MED ORDER — BUPIVACAINE HCL (PF) 0.5 % IJ SOLN
INTRAMUSCULAR | Status: DC | PRN
  Administered 2023-11-26: 20 mL via PERINEURAL

## 2023-11-26 MED ORDER — ACETAMINOPHEN 10 MG/ML IV SOLN
INTRAVENOUS | Status: AC
  Filled 2023-11-26: qty 100

## 2023-11-26 MED ORDER — EPHEDRINE 5 MG/ML INJ
INTRAVENOUS | Status: AC
  Filled 2023-11-26: qty 5

## 2023-11-26 MED ORDER — PANTOPRAZOLE SODIUM 40 MG PO TBEC
40.0000 mg | DELAYED_RELEASE_TABLET | Freq: Every day | ORAL | Status: DC
  Administered 2023-11-26: 40 mg via ORAL
  Filled 2023-11-26: qty 1

## 2023-11-26 MED ORDER — PROPOFOL 500 MG/50ML IV EMUL
INTRAVENOUS | Status: DC | PRN
  Administered 2023-11-26: 150 ug/kg/min via INTRAVENOUS

## 2023-11-26 MED ORDER — PHENYLEPHRINE 80 MCG/ML (10ML) SYRINGE FOR IV PUSH (FOR BLOOD PRESSURE SUPPORT)
PREFILLED_SYRINGE | INTRAVENOUS | Status: AC
Start: 2023-11-26 — End: ?
  Filled 2023-11-26: qty 10

## 2023-11-26 MED ORDER — MIDAZOLAM HCL 2 MG/2ML IJ SOLN
INTRAMUSCULAR | Status: AC
  Filled 2023-11-26: qty 2

## 2023-11-26 MED ORDER — BUPIVACAINE LIPOSOME 1.3 % IJ SUSP
INTRAMUSCULAR | Status: DC | PRN
  Administered 2023-11-26: 20 mL via PERINEURAL

## 2023-11-26 MED ORDER — EPHEDRINE SULFATE-NACL 50-0.9 MG/10ML-% IV SOSY
PREFILLED_SYRINGE | INTRAVENOUS | Status: DC | PRN
  Administered 2023-11-26: 5 mg via INTRAVENOUS
  Administered 2023-11-26: 10 mg via INTRAVENOUS
  Administered 2023-11-26 (×2): 5 mg via INTRAVENOUS
  Administered 2023-11-26 (×2): 10 mg via INTRAVENOUS
  Administered 2023-11-26 (×2): 5 mg via INTRAVENOUS
  Administered 2023-11-26: 10 mg via INTRAVENOUS
  Administered 2023-11-26: 5 mg via INTRAVENOUS

## 2023-11-26 MED ORDER — PHENYLEPHRINE 80 MCG/ML (10ML) SYRINGE FOR IV PUSH (FOR BLOOD PRESSURE SUPPORT)
PREFILLED_SYRINGE | INTRAVENOUS | Status: AC
  Filled 2023-11-26: qty 10

## 2023-11-26 MED ORDER — VASHE WOUND IRRIGATION OPTIME
TOPICAL | Status: DC | PRN
  Administered 2023-11-26: 34 [oz_av]

## 2023-11-26 MED ORDER — BUPIVACAINE HCL (PF) 0.5 % IJ SOLN
INTRAMUSCULAR | Status: AC
  Filled 2023-11-26: qty 30

## 2023-11-26 MED ORDER — FENTANYL CITRATE PF 50 MCG/ML IJ SOSY
PREFILLED_SYRINGE | INTRAMUSCULAR | Status: AC
  Filled 2023-11-26: qty 1

## 2023-11-26 MED ORDER — HEPARIN (PORCINE) 25000 UT/250ML-% IV SOLN
1800.0000 [IU]/h | INTRAVENOUS | Status: DC
  Administered 2023-11-26 – 2023-11-28 (×4): 1800 [IU]/h via INTRAVENOUS
  Filled 2023-11-26 (×4): qty 250

## 2023-11-26 MED ORDER — CEFAZOLIN SODIUM-DEXTROSE 1-4 GM/50ML-% IV SOLN
1.0000 g | Freq: Three times a day (TID) | INTRAVENOUS | Status: AC
Start: 2023-11-27 — End: 2023-11-28
  Administered 2023-11-27 (×3): 1 g via INTRAVENOUS
  Filled 2023-11-26 (×3): qty 50

## 2023-11-26 MED ORDER — PHENYLEPHRINE 80 MCG/ML (10ML) SYRINGE FOR IV PUSH (FOR BLOOD PRESSURE SUPPORT)
PREFILLED_SYRINGE | INTRAVENOUS | Status: DC | PRN
  Administered 2023-11-26 (×12): 160 ug via INTRAVENOUS
  Administered 2023-11-26: 80 ug via INTRAVENOUS
  Administered 2023-11-26 (×2): 160 ug via INTRAVENOUS

## 2023-11-26 MED ORDER — FENTANYL CITRATE (PF) 100 MCG/2ML IJ SOLN
INTRAMUSCULAR | Status: DC | PRN
  Administered 2023-11-26: 50 ug via INTRAVENOUS

## 2023-11-26 MED ORDER — ACETAMINOPHEN 500 MG PO TABS: 1000.0000 mg | ORAL_TABLET | Freq: Three times a day (TID) | ORAL | Status: DC | PRN

## 2023-11-26 MED ORDER — 0.9 % SODIUM CHLORIDE (POUR BTL) OPTIME
TOPICAL | Status: DC | PRN
Start: 2023-11-26 — End: 2023-11-26
  Administered 2023-11-26: 1000 mL

## 2023-11-26 MED ORDER — DEXMEDETOMIDINE HCL IN NACL 80 MCG/20ML IV SOLN
INTRAVENOUS | Status: DC | PRN
  Administered 2023-11-26: 12 ug via INTRAVENOUS

## 2023-11-26 MED ORDER — HYDROMORPHONE HCL 1 MG/ML IJ SOLN
0.5000 mg | INTRAMUSCULAR | Status: DC | PRN
  Administered 2023-11-27 – 2023-11-29 (×13): 0.5 mg via INTRAVENOUS
  Filled 2023-11-26 (×13): qty 0.5

## 2023-11-26 SURGICAL SUPPLY — 46 items
BLADE SAGITTAL WIDE XTHICK NO (BLADE) ×1 IMPLANT
BLADE SURG SZ10 CARB STEEL (BLADE) ×1 IMPLANT
BNDG COHESIVE 4X5 TAN STRL LF (GAUZE/BANDAGES/DRESSINGS) ×1 IMPLANT
BNDG ELASTIC 4X5.8 VLCR NS LF (GAUZE/BANDAGES/DRESSINGS) ×1 IMPLANT
BNDG GAUZE DERMACEA FLUFF 4 (GAUZE/BANDAGES/DRESSINGS) ×1 IMPLANT
BRUSH SCRUB EZ 4% CHG (MISCELLANEOUS) ×1 IMPLANT
CANISTER WOUND CARE 500ML ATS (WOUND CARE) IMPLANT
CHLORAPREP W/TINT 26 (MISCELLANEOUS) ×1 IMPLANT
CLEANSER WND VASHE 34 (WOUND CARE) IMPLANT
DERMABOND ADVANCED .7 DNX12 (GAUZE/BANDAGES/DRESSINGS) ×2 IMPLANT
DRAPE INCISE IOBAN 66X45 STRL (DRAPES) ×1 IMPLANT
DRSG GAUZE FLUFF 36X18 (GAUZE/BANDAGES/DRESSINGS) ×1 IMPLANT
DRSG VAC GRANUFOAM MED (GAUZE/BANDAGES/DRESSINGS) IMPLANT
ELECT CAUTERY BLADE 6.4 (BLADE) ×1 IMPLANT
ELECT REM PT RETURN 9FT ADLT (ELECTROSURGICAL) ×1 IMPLANT
ELECTRODE REM PT RTRN 9FT ADLT (ELECTROSURGICAL) ×1 IMPLANT
GLOVE BIO SURGEON STRL SZ7 (GLOVE) ×2 IMPLANT
GLOVE INDICATOR 7.5 STRL GRN (GLOVE) ×1 IMPLANT
GLOVE SURG SYN 8.0 (GLOVE) ×1 IMPLANT
GLOVE SURG SYN 8.0 PF PI (GLOVE) ×1 IMPLANT
GOWN STRL REUS W/ TWL LRG LVL3 (GOWN DISPOSABLE) ×2 IMPLANT
GOWN STRL REUS W/ TWL XL LVL3 (GOWN DISPOSABLE) ×1 IMPLANT
HANDLE YANKAUER SUCT BULB TIP (MISCELLANEOUS) ×1 IMPLANT
KIT TURNOVER KIT A (KITS) ×1 IMPLANT
LABEL OR SOLS (LABEL) ×1 IMPLANT
MANIFOLD NEPTUNE II (INSTRUMENTS) ×1 IMPLANT
MAT ABSORB FLUID 56X50 GRAY (MISCELLANEOUS) ×1 IMPLANT
NS IRRIG 500ML POUR BTL (IV SOLUTION) ×1 IMPLANT
PACK EXTREMITY ARMC (MISCELLANEOUS) ×1 IMPLANT
PAD ABD DERMACEA PRESS 5X9 (GAUZE/BANDAGES/DRESSINGS) ×1 IMPLANT
PAD PREP OB/GYN DISP 24X41 (PERSONAL CARE ITEMS) ×1 IMPLANT
SPONGE T-LAP 18X18 ~~LOC~~+RFID (SPONGE) ×2 IMPLANT
STAPLER SKIN PROX 35W (STAPLE) IMPLANT
STOCKINETTE M/LG 89821 (MISCELLANEOUS) ×1 IMPLANT
SUT MNCRL 4-0 27 PS-2 XMFL (SUTURE) IMPLANT
SUT SILK 2-0 18XBRD TIE 12 (SUTURE) ×1 IMPLANT
SUT SILK 3-0 18XBRD TIE 12 (SUTURE) ×1 IMPLANT
SUT VIC AB 0 CT1 36 (SUTURE) ×4 IMPLANT
SUT VIC AB 3-0 SH 27X BRD (SUTURE) ×2 IMPLANT
SUT VICRYL PLUS ABS 0 54 (SUTURE) ×1 IMPLANT
SUTURE MNCRL 4-0 27XMF (SUTURE) ×1 IMPLANT
SYR 20ML LL LF (SYRINGE) ×2 IMPLANT
TAPE UMBILICAL 1/8 X36 TWILL (MISCELLANEOUS) ×1 IMPLANT
TOWEL OR 17X26 4PK STRL BLUE (TOWEL DISPOSABLE) ×2 IMPLANT
TRAP FLUID SMOKE EVACUATOR (MISCELLANEOUS) ×1 IMPLANT
WATER STERILE IRR 500ML POUR (IV SOLUTION) ×1 IMPLANT

## 2023-11-26 NOTE — Anesthesia Procedure Notes (Signed)
 Anesthesia Regional Block: Adductor canal block   Pre-Anesthetic Checklist: , timeout performed,  Correct Patient, Correct Site, Correct Laterality,  Correct Procedure, Correct Position, site marked,  Risks and benefits discussed,  Surgical consent,  Pre-op evaluation,  At surgeon's request and post-op pain management  Laterality: Left and Lower  Prep: chloraprep       Needles:  Injection technique: Single-shot  Needle Type: Stimiplex     Needle Length: 9cm  Needle Gauge: 22     Additional Needles:   Procedures:,,,, ultrasound used (permanent image in chart),,    Narrative:  Start time: 11/26/2023 8:00 AM End time: 11/26/2023 8:02 AM Injection made incrementally with aspirations every 5 mL.  Performed by: Personally  Anesthesiologist: Baltazar Bonier, MD  Additional Notes: Patient consented for risk and benefits of nerve block including but not limited to nerve damage, failed block, bleeding and infection.  Patient voiced understanding.  Functioning IV was confirmed and monitors were applied.  Timeout done prior to procedure and prior to any sedation being given to the patient.  Patient confirmed procedure site prior to any sedation given to the patient. Sterile prep,hand hygiene and sterile gloves were used.  Minimal sedation used for procedure.  No paresthesia endorsed by patient during the procedure.  Negative aspiration and negative test dose prior to incremental administration of local anesthetic. The patient tolerated the procedure well with no immediate complications.

## 2023-11-26 NOTE — Consult Note (Signed)
 PHARMACY - ANTICOAGULATION CONSULT NOTE  Pharmacy Consult for Heparin infusion Indication: PE and low extremity ischemia  Allergies  Allergen Reactions   Amlodipine Swelling   Patient Measurements: Height: 5\' 4"  (162.6 cm) Weight: 81.6 kg (180 lb) IBW/kg (Calculated) : 54.7 HEPARIN DW (KG): 72.4  Vital Signs: Temp: 98.1 F (36.7 C) (04/12 0331) Temp Source: Oral (04/12 0331) BP: 124/67 (04/12 0331) Pulse Rate: 79 (04/12 0331)  Labs: Recent Labs    11/23/23 0949 11/23/23 1441 11/24/23 0254 11/24/23 0832 11/24/23 1047 11/25/23 0000 11/25/23 0816 11/25/23 1427 11/25/23 2250 11/26/23 0526  HGB 11.4*   < > 9.5*   < > 10.2*  --  9.3*  --   --  8.3*  HCT 35.2*   < > 28.6*   < > 30.4*  --  27.4*  --   --  25.0*  PLT 885*  --   --   --  875*  --  838*  --   --  803*  APTT 30  --  34   < >  --  42* 72* 59* 86* 87*  LABPROT 14.8  --   --   --   --   --   --   --   --   --   INR 1.1  --   --   --   --   --   --   --   --   --   HEPARINUNFRC  --    < > 0.78*  --   --  0.54 0.85*  --   --  0.78*  CREATININE 0.78  --  0.68  --   --   --  0.73  --   --   --    < > = values in this interval not displayed.   Estimated Creatinine Clearance: 83.1 mL/min (by C-G formula based on SCr of 0.73 mg/dL).  Medical History: Past Medical History:  Diagnosis Date   Breast cancer (HCC)    Cancer (HCC) 09/2018   Right breast   COVID-19 11/2018   Family history of breast cancer    Hypertension    Personal history of radiation therapy 2020   Rt breast    Medications:  Apixaban 5mg  po BID - last dose 4/9 @ 0730 per med hx  Assessment: 53 yo fe,ale patient with PMH of hypertension, GERD, bipolar disorder, breast cancer, recent admission to [facility] for PE and arterial occlusion of left lower extremity. Present to ED with foot pain and blood in stools. Pharmacy consulted to initiate heparin infusion per vascular provider. Per their note on 4/11, patient will undergo LLE BKA on 4/12.  Per Dr  Prescilla Brod: give 1/2 the calculated bolus and OK to follow nomogram thereafter.  Noted last apixaban given > 12 hours ago. aPTT 30, INR 1.1, hgb 11.4 Diverticulitis found on CT.  4/10 @ 0254: aPTT = 34, HL = 0.78  SUBtherapeutic, not correlating 4/11 @ 0000: aPTT = 42, HL = 0.54  SUBtherapeutic, not correlating  4/11 @ 0816: aPTT = 72, HL = 0.85  Therapeutic x 1, not correlating 4/11 @ 1427: aPTT = 59   SUBtherapeutic 4/12 @ 0526: aPTT = 87,   HL = 0.78  Goal of Therapy:  Heparin level 0.3-0.7 units/ml aPTT 66-102 seconds Monitor platelets by anticoagulation protocol: Yes   Plan:  4/12 @ 0526:  aPTT = 87,  HL = 0.78 - aPTT therapeutic X 2,  HL still slightly elevated from Eliquis  PTA - Will continue pt on current rate and recheck aPTT and HL on 4/13 with AM labs Heparin level with AM labs tomorrow to assess for correlation. Continue to monitor CBC daily while on heparin infusion.  Aayushi Solorzano D Clinical Pharmacist 11/26/2023 6:25 AM  .Sherril Dole

## 2023-11-26 NOTE — Plan of Care (Signed)

## 2023-11-26 NOTE — Op Note (Signed)
   OPERATIVE NOTE   PROCEDURE: Left below-the-knee amputation  PRE-OPERATIVE DIAGNOSIS: Left foot gangrene  POST-OPERATIVE DIAGNOSIS: same as above  SURGEON: [name], MD  ASSISTANT(S): None  ANESTHESIA: general  ESTIMATED BLOOD LOSS: 100 cc  FINDING(S): Healthy muscle bellies  SPECIMEN(S):  Left below-the-knee amputation  INDICATIONS:   Penny Chase is a 53 y.o. female who presents with left leg gangrene.  The patient is scheduled for a left below-the-knee amputation.  I discussed in depth with the patient the risks, benefits, and alternatives to this procedure.  The patient is aware that the risk of this operation included but are not limited to:  bleeding, infection, myocardial infarction, stroke, death, failure to heal amputation wound, and possible need for more proximal amputation.  The patient is aware of the risks and agrees proceed forward with the procedure.  DESCRIPTION:  After full informed written consent was obtained from the patient, the patient was brought back to the operating room, and placed supine upon the operating table.  Prior to induction, the patient received IV antibiotics.  The patient was then prepped and draped in the standard fashion for a below-the-knee amputation.  After obtaining adequate anesthesia, the patient was prepped and draped in the standard fashion for a left below-the-knee amputation.  I marked out the anterior incision two finger breadths below the tibial tuberosity and then the marked out a posterior flap that was one third of the circumference of the calf in length.   I made the incisions for these flaps, and then dissected through the subcutaneous tissue, fascia, and muscle anteriorly.  I elevated  the periosteal tissue superiorly so that the tibia was about 3-4 cm shorter than the anterior skin flap.  I then transected the tibia with a power saw and then took a wedge off the tibia anteriorly with the power saw.  Then I smoothed out  the rough edges.  In a similar fashion, I cut back the fibula about two centimeters higher than the level of the tibia with a bone cutter.  I put a bone hook into the distal tibia and then used a large amputation knife to sharply develop a tissue plane through the muscle along the fibula.  In such fashion, the posterior flap was developed.  At this point, the specimen was passed off the field as the below-the-knee amputation.  At this point, I clamped all visibly bleeding arteries and veins using a combination of suture ligation with Silk suture and electrocautery.  Bleeding continued to be controlled with electrocautery and suture ligature.  The stump was washed off with sterile normal saline and no further active bleeding was noted.  I reapproximated the anterior and posterior fascia  with interrupted stitches of 0 Vicryl.  This was completed along the entire length of anterior and posterior fascia until there were no more loose space in the fascial line. I then placed a layer of 2-0 Vicryl sutures in the subcutaneous tissue. The skin was then  reapproximated with staples.  The stump was washed off and dried.  The incision was dressed with Xeroform and  then fluffs were applied.  Kerlix was wrapped around the leg and then gently an ACE wrap was applied.    COMPLICATIONS: none  CONDITION: stable   [name]  11/26/2023, 9:56 AM    This note was created with Dragon Medical transcription system. Any errors in dictation are purely unintentional.

## 2023-11-26 NOTE — Progress Notes (Incomplete Revision)
 [facility]  - [place] [facility]   PATIENT NAME: Penny Chase    MR#:  [id number]  DATE OF BIRTH:  11-18-1970  SUBJECTIVE:  patient's friend and family at bedside. Just came back from left lower extremity BKA. Patient reports some bloodied teens stool since Monday after discharge from [facility]. She was there for bilateral PE, DVT underwent thrombectomy/embolectomy LE and placed on PO eliquis at discharge from [facility]. She is aware of phantom limb pain.    VITALS:  Blood pressure (!) 89/53, pulse 90, temperature 98.4 F (36.9 C), temperature source Oral, resp. rate 18, height 5\' 4"  (1.626 m), weight 81.6 kg, last menstrual period 09/20/2018, SpO2 99%.  PHYSICAL EXAMINATION:   GENERAL:  53 y.o.-year-old patient with no acute distress.  LUNGS: Normal breath sounds bilaterally, no wheezing CARDIOVASCULAR: S1, S2 normal. No murmur   ABDOMEN: Soft, nontender, nondistended. Bowel sounds present.  EXTREMITIES: left BKA dressing+ NEUROLOGIC: nonfocal  patient is alert and awake  LABORATORY PANEL:  CBC Recent Labs  Lab 11/26/23 1014  WBC 24.5*  HGB 6.5*  HCT 19.8*  PLT 589*    Chemistries  Recent Labs  Lab 11/24/23 0254 11/25/23 0816  NA 133* 137  K 3.8 4.2  CL 102 109  CO2 21* 24  GLUCOSE 124* 118*  BUN 15 15  CREATININE 0.68 0.73  CALCIUM 8.3* 8.4*  MG  --  2.2  AST 28  --   ALT 27  --   ALKPHOS 97  --   BILITOT 0.5  --     Assessment and Plan  Penny Chase is a 53 y.o. female with medical history significant of PE, hypertension, tobacco abuse, substance abuse, history of right breast cancer presenting with left lower extremity ischemia, GI bleeding, diverticulitis.  Patient reports worsening left foot pain redness and swelling over the past 1 to 2 days.  Noted to have been recently evaluated in the [facility] system March 29 through April 7 with issues including multiple subsegmental PEs, lower extremity arterial occlusion status post thrombectomy.      Limb ischemia Gangrene of left foot  --Worsening left lower extremity distal foot redness and discoloration concerning for limb ischemia and dry gangrene Noted recent admission in the [facility] system for similar issues with patient refusing amputation. --Procedures at [facility] included iliac thrombus in addition to thrombus in the AT and PT. S/p L CFA/AT/PT cutdown, L AT/PT thromboembolectomy 11/14/23-patient refused amputation --per podiatry, no need for abx --angiogram on 4/10 with no intervention preformed --s/p left BKA per Dr Prescilla Brod  Anemia of chronic disease -- came in with hemoglobin of 11.4--- 6.5-- will give one unit blood transfusion   PAD --per vascular, "plan for distal aortic stenting with coverage of the common iliac arteries in 4 to 6 weeks once the left groin has healed." --cont ASA and statin   PE (pulmonary thromboembolism) (HCC) Left LE DVT/Arterial thrombus underwent procedure at [facility] Noted recent diagnosis in the [facility] system with PE --hold home Eliquis --cont heparin gtt for now since patient is reporting bright red blood per rectum occasionally.    Leukocytosis Marked white count of 28 on presentation Suspect multifactorial with reactive component given multiple recent vascular procedures as well as suspected diverticulitis on CT scan   Lower GI bleeding suspected ischemic colitis Noted bloody bowel movements x 12 to 24 hours in the setting of anticoagulation use CT imaging with noted diverticulitis --monitor Hgb -- G.I. consultation with Dr. Baldomero Bone -- will continue IV heparin  drip given limb ischemia, PE, DVT, arterial thrombus. -- Patient understands the risk and complications of being of blood thinner and G.I. bleed --Stool studies negative   Substance abuse (HCC) Pt self admits to MJ use   Tobacco abuse --1 pack/day smoker Discussed cessation at length --cont nicotine patch and gum   HTN (hypertension), benign --BP stable     DVT prophylaxis: [id number]  gtt Code Status: Full code  Family Communication: dter and friend Level of care: Med-Surg Dispo:   The patient is from: home Anticipated d/c is to: TBD Anticipated d/c date is: > 3 days       TOTAL TIME TAKING CARE OF THIS PATIENT: 40 minutes.  >50% time spent on counselling and coordination of care  Note: This dictation was prepared with Dragon dictation along with smaller phrase technology. Any transcriptional errors that result from this process are unintentional.  Melvinia Stager M.D    [facility]   CC: Primary care physician; [facility], [facility]

## 2023-11-26 NOTE — Interval H&P Note (Signed)
 History and Physical Interval Note:  11/26/2023 7:56 AM  Penny Chase  has presented today for surgery, with the diagnosis of Ischemia.  The various methods of treatment have been discussed with the patient and family. After consideration of risks, benefits and other options for treatment, the patient has consented to  Procedure(s): AMPUTATION BELOW KNEE (Left) as a surgical intervention.  The patient's history has been reviewed, patient examined, no change in status, stable for surgery.  I have reviewed the patient's chart and labs.  Questions were answered to the patient's satisfaction.     [name]

## 2023-11-26 NOTE — Consult Note (Signed)
 Karma Oz, MD [address]  [address]  [place], [place] [address]  Main: [phone]  Fax: [phone] Pager: [phone]   Consultation  Referring Provider:     No ref. provider found Primary Care Physician:  Center, [facility] Primary Gastroenterologist: Para Bold         Reason for Consultation:   Bright red blood per rectum   Date of Admission:  11/23/2023 Date of Consultation:  11/26/2023         HPI:   Penny Chase is a 53 y.o. female with history of tobacco use, personal history of breast cancer, hypertension, peripheral artery disease, diagnosed with multiple subsegmental PEs about 2 weeks ago when she was admitted at [facility], started on Eliquis, severe left lower extremity ischemia, s/p thrombectomy presented to [facility] on 4/9 secondary to worsening of left lower extremity ischemia s/p angiography on 4/10, unfortunately ischemia progressed and underwent left below-knee amputation today.  Patient states that since Monday, she has been noticing bright red blood per rectum along with diarrhea since she was discharged from [facility].  Reports mild diffuse lower abdominal tenderness as well as some epigastric discomfort.  Imaging on admission revealed fluid-filled colon with nonspecific enteritis/colitis.  She also presented with significant leukocytosis and thrombocytosis GI is consulted today due to worsening of anemia, hemoglobin dropped from 11.4 on admission to 6.5 today.  It dropped from 9.3 to 6.5 within last 24 hours. She is receiving blood transfusion when I interviewed the patient Active tobacco use Patient's husband is bedside Denies alcohol use recently  NSAIDs: Reports past history of NSAID use, including BC powder and Goody powder use  Antiplts/Anticoagulants/Anti thrombotics: Eliquis and aspirin 81 mg daily  GI Procedures: None  Past Medical History:  Diagnosis Date   Breast cancer (HCC)    Cancer (HCC) 09/2018   Right breast   COVID-19  11/2018   Family history of breast cancer    Hypertension    Personal history of radiation therapy 2020   Rt breast    Past Surgical History:  Procedure Laterality Date   BREAST BIOPSY Right 09/12/2018   us  core/"venus clip"-INVASIVE MAMMARYLobular CARCINOMA, WITH LOBULAR FEATURES   BREAST LUMPECTOMY Right 10/04/2018   BREAST LUMPECTOMY WITH NEEDLE LOCALIZATION AND AXILLARY SENTINEL LYMPH NODE BX Right 10/04/2018   Procedure: BREAST LUMPECTOMY WITH NEEDLE LOCALIZATION AND AXILLARY SENTINEL LYMPH NODE BX;  Surgeon: Eldred Grego, MD;  Location: [facility] ORS;  Service: General;  Laterality: Right;   LOWER EXTREMITY ANGIOGRAPHY Left 11/24/2023   Procedure: Lower Extremity Angiography;  Surgeon: Jackquelyn Mass, MD;  Location: [facility] INVASIVE CV LAB;  Service: Cardiovascular;  Laterality: Left;   TONSILLECTOMY       Current Facility-Administered Medications:    acetaminophen (TYLENOL) tablet 1,000 mg, 1,000 mg, Oral, Q8H PRN, Schnier, Gregory G, MD   aspirin EC tablet 81 mg, 81 mg, Oral, Daily, Schnier, Gregory G, MD, 81 mg at 11/26/23 1154   [START ON 11/27/2023] ceFAZolin (ANCEF) IVPB 1 g/50 mL premix, 1 g, Intravenous, Q8H, Schnier, Ninette Basque, MD   citalopram (CELEXA) tablet 20 mg, 20 mg, Oral, Daily, Schnier, Gregory G, MD, 20 mg at 11/26/23 1154   gabapentin (NEURONTIN) capsule 600 mg, 600 mg, Oral, TID, Schnier, Gregory G, MD, 600 mg at 11/26/23 1441   heparin ADULT infusion 100 units/mL (25000 units/250mL), 1,800 Units/hr, Intravenous, Continuous, Schnier, Ninette Basque, MD, Last Rate: 18 mL/hr at 11/26/23 1210, 1,800 Units/hr at 11/26/23 1210   HYDROmorphone (DILAUDID) injection  0.5 mg, 0.5 mg, Intravenous, Q3H PRN, Schnier, Gregory G, MD   nicotine (NICODERM CQ - dosed in mg/24 hours) patch 21 mg, 21 mg, Transdermal, Daily, Schnier, Gregory G, MD, 21 mg at 11/26/23 1155   nicotine polacrilex (NICORETTE) gum 2 mg, 2 mg, Oral, PRN, Schnier, Gregory G, MD, 2 mg at 11/24/23 1228    ondansetron (ZOFRAN) tablet 4 mg, 4 mg, Oral, Q6H PRN **OR** [DISCONTINUED] ondansetron (ZOFRAN) injection 4 mg, 4 mg, Intravenous, Q6H PRN, Schnier, Gregory G, MD   oxyCODONE (Oxy IR/ROXICODONE) immediate release tablet 10 mg, 10 mg, Oral, Q4H PRN, Schnier, Gregory G, MD, 10 mg at 11/26/23 1441   pantoprazole (PROTONIX) EC tablet 40 mg, 40 mg, Oral, Daily, Patel, Sona, MD, 40 mg at 11/26/23 1440   pravastatin (PRAVACHOL) tablet 10 mg, 10 mg, Oral, Daily, Schnier, Ninette Basque, MD, 10 mg at 11/25/23 2034   Family History  Problem Relation Age of Onset   Breast cancer Paternal Aunt      Social History   Tobacco Use   Smoking status: Every Day    Current packs/day: 1.00    Average packs/day: 1 pack/day for 15.0 years (15.0 ttl pk-yrs)    Types: Cigarettes   Smokeless tobacco: Never   Tobacco comments:    patient declined  Vaping Use   Vaping status: Former  Substance Use Topics   Alcohol use: Not Currently    Comment: not taken for about 6 weeks   Drug use: Not Currently    Types: Marijuana    Comment: Uses street opioids    Allergies as of 11/23/2023 - Review Complete 11/23/2023  Allergen Reaction Noted   Amlodipine Swelling 01/05/2021    Review of Systems:    All systems reviewed and negative except where noted in HPI.   Physical Exam:  Vital signs in last 24 hours: Temp:  [98 F (36.7 C)-99.2 F (37.3 C)] 98.4 F (36.9 C) (04/12 1317) Pulse Rate:  [79-100] 90 (04/12 1317) Resp:  [12-19] 18 (04/12 1317) BP: (83-130)/(47-80) 89/53 (04/12 1317) SpO2:  [90 %-99 %] 99 % (04/12 1317) Last BM Date : 11/26/23 General:   Pleasant, cooperative in NAD Head:  Normocephalic and atraumatic. Eyes:   No icterus.   Conjunctiva pink. PERRLA. Ears:  Normal auditory acuity. Neck:  Supple; no masses or thyroidomegaly Lungs: Respirations even and unlabored. Lungs clear to auscultation bilaterally.   No wheezes, crackles, or rhonchi.  Heart:  Regular rate and rhythm;  Without murmur,  clicks, rubs or gallops Abdomen:  Soft, nondistended, nontender. Normal bowel sounds. No appreciable masses or hepatomegaly.  No rebound or guarding.  Rectal:  Not performed. Msk:  Symmetrical without gross deformities.  Strength generalized weakness Extremities:  Without edema, cyanosis or clubbing, left below-knee amputation, dressing in place, appears clean and no bleeding. Neurologic:  Alert and oriented x3;  grossly normal neurologically. Skin:  Intact without significant lesions or rashes. Psych:  Alert and cooperative. Normal affect.  LAB RESULTS:    Latest Ref Rng & Units 11/26/2023   10:14 AM 11/26/2023    5:26 AM 11/25/2023    8:16 AM  CBC  WBC 4.0 - 10.5 K/uL 24.5  30.9  21.6   Hemoglobin 12.0 - 15.0 g/dL 6.5  8.3  9.3   Hematocrit 36.0 - 46.0 % 19.8  25.0  27.4   Platelets 150 - 400 K/uL 589  803  838     BMET    Latest Ref Rng & Units 11/25/2023  8:16 AM 11/24/2023    2:54 AM 11/23/2023    9:49 AM  BMP  Glucose 70 - 99 mg/dL 161  096  045   BUN 6 - 20 mg/dL 15  15  14    Creatinine 0.44 - 1.00 mg/dL 4.09  8.11  9.14   Sodium 135 - 145 mmol/L 137  133  135   Potassium 3.5 - 5.1 mmol/L 4.2  3.8  3.9   Chloride 98 - 111 mmol/L 109  102  102   CO2 22 - 32 mmol/L 24  21  23    Calcium 8.9 - 10.3 mg/dL 8.4  8.3  8.7     LFT    Latest Ref Rng & Units 11/24/2023    2:54 AM 11/23/2023    9:49 AM 01/01/2021    3:10 PM  Hepatic Function  Total Protein 6.5 - 8.1 g/dL 6.6  7.6  7.5   Albumin 3.5 - 5.0 g/dL 2.5  2.9  4.2   AST 15 - 41 U/L 28  38  17   ALT 0 - 44 U/L 27  33  17   Alk Phosphatase 38 - 126 U/L 97  111  70   Total Bilirubin 0.0 - 1.2 mg/dL 0.5  0.3  0.6      STUDIES: US  OR NERVE BLOCK-IMAGE ONLY [facility]) Result Date: 11/26/2023 There is no interpretation for this exam.  This order is for images obtained during a surgical procedure.  Please See "Surgeries" Tab for more information regarding the procedure.   PERIPHERAL VASCULAR CATHETERIZATION Result Date:  11/24/2023 See surgical note for result.     Impression / Plan:   Penny Chase is a 53 y.o. female with chronic tobacco use, hypertension, past history of breast cancer survivor with recent diagnosis of multiple subsegmental PEs at [facility], left lower extremity ischemia, s/p thrombectomy on Eliquis and aspirin 81 mg daily is admitted at [facility] secondary to worsening of left lower extremity limb ischemia, s/p angiography followed by left below-knee amputation on 4/12. GI is consulted for acute blood loss anemia, ongoing bright red blood per rectum as well as diarrhea  Acute blood loss anemia, bright red blood per rectum and diarrhea Stool studies are negative for infection including C. difficile Differentials include diverticular bleed or ischemic colitis or ?  Malignancy or bleeding from AVMs Less likely an upper GI bleed, normal BUN/creatinine Receiving blood transfusion at present Monitor CBC closely to maintain hemoglobin above 8, recommend to check H&H every 6-8 hours Discussed with patient regarding colonoscopy for further evaluation and long-term need for anticoagulation given her history of extensive PE and peripheral artery disease.  Also, discussed with her regarding upper endoscopy to evaluate for any other pathology that might increase her risk for bleeding on long-term anticoagulation and she is agreeable with both procedures.  Tentatively, plan for endoscopic evaluation on Monday Recommend to increase Protonix to 40 mg p.o. twice daily Will switch to liquid diet and n.p.o. effective past midnight in case she needs urgent endoscopic evaluation tomorrow   Thank you for involving me in the care of this patient.  Will follow along with you    LOS: 2 days   Ellis Guys, MD  11/26/2023, 3:42 PM    Note: This dictation was prepared with Dragon dictation along with smaller phrase technology. Any transcriptional errors that result from this process are unintentional.

## 2023-11-26 NOTE — Progress Notes (Addendum)
 [facility]  - [place] at [facility]   PATIENT NAME: Penny Chase    MR#:  [id number]  DATE OF BIRTH:  11-18-1970  SUBJECTIVE:  patient's friend and family at bedside. Just came back from left lower extremity BKA. Patient reports some bloodied teens stool since Monday after discharge from [facility]. She was there for bilateral PE, DVT underwent thrombectomy/embolectomy LE and placed on PO eliquis at discharge from [facility]. She is aware of phantom limb pain.    VITALS:  Blood pressure (!) 89/53, pulse 90, temperature 98.4 F (36.9 C), temperature source Oral, resp. rate 18, height 5\' 4"  (1.626 m), weight 81.6 kg, last menstrual period 09/20/2018, SpO2 99%.  PHYSICAL EXAMINATION:   GENERAL:  53 y.o.-year-old patient with no acute distress.  LUNGS: Normal breath sounds bilaterally, no wheezing CARDIOVASCULAR: S1, S2 normal. No murmur   ABDOMEN: Soft, nontender, nondistended. Bowel sounds present.  EXTREMITIES: left BKA dressing+ NEUROLOGIC: nonfocal  patient is alert and awake  LABORATORY PANEL:  CBC Recent Labs  Lab 11/26/23 1014  WBC 24.5*  HGB 6.5*  HCT 19.8*  PLT 589*    Chemistries  Recent Labs  Lab 11/24/23 0254 11/25/23 0816  NA 133* 137  K 3.8 4.2  CL 102 109  CO2 21* 24  GLUCOSE 124* 118*  BUN 15 15  CREATININE 0.68 0.73  CALCIUM 8.3* 8.4*  MG  --  2.2  AST 28  --   ALT 27  --   ALKPHOS 97  --   BILITOT 0.5  --     Assessment and Plan  Penny Chase is a 53 y.o. female with medical history significant of PE, hypertension, tobacco abuse, substance abuse, history of right breast cancer presenting with left lower extremity ischemia, GI bleeding, diverticulitis.  Patient reports worsening left foot pain redness and swelling over the past 1 to 2 days.  Noted to have been recently evaluated in the [facility] system March 29 through April 7 with issues including multiple subsegmental PEs, lower extremity arterial occlusion status post thrombectomy.      Limb ischemia Gangrene of left foot  --Worsening left lower extremity distal foot redness and discoloration concerning for limb ischemia and dry gangrene Noted recent admission in the [facility] system for similar issues with patient refusing amputation. --Procedures at [facility] included iliac thrombus in addition to thrombus in the AT and PT. S/p L CFA/AT/PT cutdown, L AT/PT thromboembolectomy 11/14/23-patient refused amputation --per podiatry, no need for abx --angiogram on 4/10 with no intervention preformed --s/p left BKA per Dr Prescilla Brod  Anemia of chronic disease -- came in with hemoglobin of 11.4--- 6.5-- will give one unit blood transfusion   PAD --per vascular, "plan for distal aortic stenting with coverage of the common iliac arteries in 4 to 6 weeks once the left groin has healed." --cont ASA and statin   PE (pulmonary thromboembolism) (HCC) Left LE DVT/Arterial thrombus underwent procedure at [facility] Noted recent diagnosis in the [facility] system with PE --hold home Eliquis --cont heparin gtt for now since patient is reporting bright red blood per rectum occasionally.    Leukocytosis Marked white count of 28 on presentation Suspect multifactorial with reactive component given multiple recent vascular procedures as well as suspected diverticulitis on CT scan   Lower GI bleeding suspected ischemic colitis Noted bloody bowel movements x 12 to 24 hours in the setting of anticoagulation use CT imaging with noted diverticulitis --monitor Hgb -- G.I. consultation with Dr. Baldomero Bone -- will continue IV heparin  drip given limb ischemia, PE, DVT, arterial thrombus. -- Patient understands the risk and complications of being of blood thinner and G.I. bleed --Stool studies negative   Substance abuse (HCC) Pt self admits to MJ use   Tobacco abuse --1 pack/day smoker Discussed cessation at length --cont nicotine patch and gum   HTN (hypertension), benign --BP stable     DVT prophylaxis: ZO:[id number]  gtt Code Status: Full code  Family Communication: dter and friend Level of care: Med-Surg Dispo:   The patient is from: home Anticipated d/c is to: TBD Anticipated d/c date is: > 3 days       TOTAL TIME TAKING CARE OF THIS PATIENT: 40 minutes.  >50% time spent on counselling and coordination of care  Note: This dictation was prepared with Dragon dictation along with smaller phrase technology. Any transcriptional errors that result from this process are unintentional.  Melvinia Stager M.D    [facility]   CC: Primary care physician; Center, [facility]

## 2023-11-26 NOTE — Transfer of Care (Signed)
 Immediate Anesthesia Transfer of Care Note  Patient: Penny Chase  Procedure(s) Performed: AMPUTATION BELOW KNEE (Left: Knee)  Patient Location: PACU  Anesthesia Type:General  Level of Consciousness: drowsy  Airway & Oxygen Therapy: Patient Spontanous Breathing and Patient connected to face mask oxygen  Post-op Assessment: Report given to RN and Post -op Vital signs reviewed and stable  Post vital signs: Reviewed and stable  Last Vitals:  Vitals Value Taken Time  BP 83/57 11/26/23 0940  Temp 36.7 C 11/26/23 0940  Pulse 99 11/26/23 0940  Resp 15 11/26/23 0940  SpO2 98 % 11/26/23 0940    Last Pain:  Vitals:   11/26/23 0615  TempSrc:   PainSc: 8       Patients Stated Pain Goal: 3 (11/23/23 2007)  Complications: No notable events documented.

## 2023-11-26 NOTE — Consult Note (Signed)
 PHARMACY - ANTICOAGULATION CONSULT NOTE  Pharmacy Consult for Heparin infusion Indication: PE and low extremity ischemia  Allergies  Allergen Reactions   Amlodipine Swelling   Patient Measurements: Height: 5\' 4"  (162.6 cm) Weight: 81.6 kg (180 lb) IBW/kg (Calculated) : 54.7 HEPARIN DW (KG): 72.4  Vital Signs: Temp: 98 F (36.7 C) (04/11 2147) Temp Source: Oral (04/11 2147) BP: 130/79 (04/11 2147) Pulse Rate: 81 (04/11 2147)  Labs: Recent Labs    11/23/23 0949 11/23/23 1441 11/24/23 0254 11/24/23 0832 11/24/23 0944 11/24/23 1047 11/25/23 0000 11/25/23 0816 11/25/23 1427 11/25/23 2250  HGB 11.4*   < > 9.5* 9.6*  --  10.2*  --  9.3*  --   --   HCT 35.2*   < > 28.6* 28.8*  --  30.4*  --  27.4*  --   --   PLT 885*  --   --   --   --  875*  --  838*  --   --   APTT 30  --  34  --    < >  --  42* 72* 59* 86*  LABPROT 14.8  --   --   --   --   --   --   --   --   --   INR 1.1  --   --   --   --   --   --   --   --   --   HEPARINUNFRC  --   --  0.78*  --   --   --  0.54 0.85*  --   --   CREATININE 0.78  --  0.68  --   --   --   --  0.73  --   --    < > = values in this interval not displayed.   Estimated Creatinine Clearance: 83.1 mL/min (by C-G formula based on SCr of 0.73 mg/dL).  Medical History: Past Medical History:  Diagnosis Date   Breast cancer (HCC)    Cancer (HCC) 09/2018   Right breast   COVID-19 11/2018   Family history of breast cancer    Hypertension    Personal history of radiation therapy 2020   Rt breast    Medications:  Apixaban 5mg  po BID - last dose 4/9 @ 0730 per med hx  Assessment: 53 yo fe,ale patient with PMH of hypertension, GERD, bipolar disorder, breast cancer, recent admission to [facility] for PE and arterial occlusion of left lower extremity. Present to ED with foot pain and blood in stools. Pharmacy consulted to initiate heparin infusion per vascular provider. Per their note on 4/11, patient will undergo LLE BKA on 4/12.  Per Dr  Prescilla Brod: give 1/2 the calculated bolus and OK to follow nomogram thereafter.  Noted last apixaban given > 12 hours ago. aPTT 30, INR 1.1, hgb 11.4 Diverticulitis found on CT.  4/10 @ 0254: aPTT = 34, HL = 0.78  SUBtherapeutic, not correlating 4/11 @ 0000: aPTT = 42, HL = 0.54  SUBtherapeutic, not correlating  4/11 @ 0816: aPTT = 72, HL = 0.85  Therapeutic x 1, not correlating 4/11 @ 1427: aPTT = 59   SUBtherapeutic 4/11 @ 2250: aPTT = 86                               Therapeutic X 1  Goal of Therapy:  Heparin level 0.3-0.7 units/ml aPTT 66-102 seconds Monitor platelets  by anticoagulation protocol: Yes   Plan:  4/11:  aPTT @ 2250 = 86, therapeutic X 1 -  Will continue pt on current rate and recheck in 6 hrs  Heparin level with AM labs tomorrow to assess for correlation. Continue to monitor CBC daily while on heparin infusion.  Jynesis Nakamura D Clinical Pharmacist 11/26/2023 12:24 AM  .Sherril Dole

## 2023-11-26 NOTE — Anesthesia Postprocedure Evaluation (Signed)
 Anesthesia Post Note  Patient: NAKESHA EBRAHIM  Procedure(s) Performed: AMPUTATION BELOW KNEE (Left: Knee)  Patient location during evaluation: PACU Anesthesia Type: General Level of consciousness: awake and alert Pain management: pain level controlled Vital Signs Assessment: post-procedure vital signs reviewed and stable Respiratory status: spontaneous breathing, nonlabored ventilation and respiratory function stable Cardiovascular status: blood pressure returned to baseline and stable Postop Assessment: no apparent nausea or vomiting Anesthetic complications: no Comments: Pt with slight asymptomatic hypotension and a post op CBC of 6.5. Will give 1 unit prbc due patients severe PAD.    No notable events documented.   Last Vitals:  Vitals:   11/26/23 1045 11/26/23 1105  BP: (!) 93/47 (!) 89/53  Pulse: 96 91  Resp: 18 19  Temp:  37.3 C  SpO2: 99% 98%    Last Pain:  Vitals:   11/26/23 1045  TempSrc:   PainSc: 0-No pain                 Baltazar Bonier

## 2023-11-26 NOTE — Plan of Care (Signed)

## 2023-11-26 NOTE — Anesthesia Preprocedure Evaluation (Addendum)
 Anesthesia Evaluation  Patient identified by MRN, date of birth, ID band Patient awake    Reviewed: Allergy & Precautions, H&P , NPO status , Patient's Chart, lab work & pertinent test results  Airway Mallampati: II  TM Distance: >3 FB Neck ROM: full    Dental no notable dental hx.    Pulmonary Current Smoker and Patient abstained from smoking. recently evaluated in the [facility] system March 29 through April 7 with issues including multiple subsegmental PE   Pulmonary exam normal        Cardiovascular hypertension, + Peripheral Vascular Disease   Rhythm:Regular Rate:Tachycardia     Neuro/Psych  PSYCHIATRIC DISORDERS   Bipolar Disorder   negative neurological ROS     GI/Hepatic negative GI ROS, Neg liver ROS,,,  Endo/Other  negative endocrine ROS    Renal/GU negative Renal ROS  negative genitourinary   Musculoskeletal   Abdominal   Peds  Hematology  (+) Blood dyscrasia, anemia   Anesthesia Other Findings Past Medical History: No date: Breast cancer (HCC) 09/2018: Cancer (HCC)     Comment:  Right breast 11/2018: COVID-19 No date: Family history of breast cancer No date: Hypertension 2020: Personal history of radiation therapy     Comment:  Rt breast  Past Surgical History: 09/12/2018: BREAST BIOPSY; Right     Comment:  us  core/"venus clip"-INVASIVE MAMMARYLobular CARCINOMA,               WITH LOBULAR FEATURES 10/04/2018: BREAST LUMPECTOMY; Right 10/04/2018: BREAST LUMPECTOMY WITH NEEDLE LOCALIZATION AND AXILLARY  SENTINEL LYMPH NODE BX; Right     Comment:  Procedure: BREAST LUMPECTOMY WITH NEEDLE LOCALIZATION               AND AXILLARY SENTINEL LYMPH NODE BX;  Surgeon:               Eldred Grego, MD;  Location: [facility] ORS;  Service:              General;  Laterality: Right; 11/24/2023: LOWER EXTREMITY ANGIOGRAPHY; Left     Comment:  Procedure: Lower Extremity Angiography;  Surgeon:               Jackquelyn Mass, MD;  Location: [facility] INVASIVE CV LAB;               Service: Cardiovascular;  Laterality: Left; No date: TONSILLECTOMY  BMI    Body Mass Index: 30.90 kg/m      Reproductive/Obstetrics negative OB ROS                             Anesthesia Physical Anesthesia Plan  ASA: 3  Anesthesia Plan: General   Post-op Pain Management: Regional block*   Induction: Intravenous  PONV Risk Score and Plan: Propofol infusion and TIVA  Airway Management Planned: Natural Airway  Additional Equipment:   Intra-op Plan:   Post-operative Plan:   Informed Consent: I have reviewed the patients History and Physical, chart, labs and discussed the procedure including the risks, benefits and alternatives for the proposed anesthesia with the patient or authorized representative who has indicated his/her understanding and acceptance.     Dental Advisory Given  Plan Discussed with: CRNA and Surgeon  Anesthesia Plan Comments:         Anesthesia Quick Evaluation

## 2023-11-26 NOTE — Anesthesia Procedure Notes (Signed)
 Anesthesia Regional Block: Popliteal block   Pre-Anesthetic Checklist: , timeout performed,  Correct Patient, Correct Site, Correct Laterality,  Correct Procedure, Correct Position, site marked,  Risks and benefits discussed,  Surgical consent,  Pre-op evaluation,  At surgeon's request and post-op pain management  Laterality: Left and Lower  Prep: chloraprep       Needles:  Injection technique: Single-shot  Needle Type: Stimiplex     Needle Length: 9cm  Needle Gauge: 22     Additional Needles:   Procedures:,,,, ultrasound used (permanent image in chart),,    Narrative:  Start time: 11/26/2023 8:02 AM End time: 11/26/2023 8:05 AM Injection made incrementally with aspirations every 5 mL.  Performed by: Personally  Anesthesiologist: Baltazar Bonier, MD  Additional Notes: Patient consented for risk and benefits of nerve block including but not limited to nerve damage, failed block, bleeding and infection.  Patient voiced understanding.  Functioning IV was confirmed and monitors were applied.  Timeout done prior to procedure and prior to any sedation being given to the patient.  Patient confirmed procedure site prior to any sedation given to the patient. Sterile prep,hand hygiene and sterile gloves were used.  Minimal sedation used for procedure.  No paresthesia endorsed by patient during the procedure.  Negative aspiration and negative test dose prior to incremental administration of local anesthetic. The patient tolerated the procedure well with no immediate complications.

## 2023-11-27 ENCOUNTER — Inpatient Hospital Stay

## 2023-11-27 ENCOUNTER — Encounter: Payer: Self-pay | Admitting: Vascular Surgery

## 2023-11-27 DIAGNOSIS — E538 Deficiency of other specified B group vitamins: Secondary | ICD-10-CM | POA: Diagnosis not present

## 2023-11-27 DIAGNOSIS — R197 Diarrhea, unspecified: Secondary | ICD-10-CM | POA: Diagnosis not present

## 2023-11-27 DIAGNOSIS — K625 Hemorrhage of anus and rectum: Secondary | ICD-10-CM | POA: Diagnosis not present

## 2023-11-27 DIAGNOSIS — I998 Other disorder of circulatory system: Secondary | ICD-10-CM | POA: Diagnosis not present

## 2023-11-27 DIAGNOSIS — D62 Acute posthemorrhagic anemia: Secondary | ICD-10-CM | POA: Diagnosis not present

## 2023-11-27 LAB — TYPE AND SCREEN
ABO/RH(D): A POS
Antibody Screen: NEGATIVE
Unit division: 0
Unit division: 0

## 2023-11-27 LAB — CBC
HCT: 26.8 % — ABNORMAL LOW (ref 36.0–46.0)
HCT: 28.4 % — ABNORMAL LOW (ref 36.0–46.0)
Hemoglobin: 8.9 g/dL — ABNORMAL LOW (ref 12.0–15.0)
Hemoglobin: 9.6 g/dL — ABNORMAL LOW (ref 12.0–15.0)
MCH: 31.2 pg (ref 26.0–34.0)
MCH: 31.5 pg (ref 26.0–34.0)
MCHC: 33.2 g/dL (ref 30.0–36.0)
MCHC: 33.8 g/dL (ref 30.0–36.0)
MCV: 94 fL (ref 80.0–100.0)
MCV: 93.1 fL (ref 80.0–100.0)
Platelets: 644 10*3/uL — ABNORMAL HIGH (ref 150–400)
Platelets: 623 10*3/uL — ABNORMAL HIGH (ref 150–400)
RBC: 2.85 MIL/uL — ABNORMAL LOW (ref 3.87–5.11)
RBC: 3.05 MIL/uL — ABNORMAL LOW (ref 3.87–5.11)
RDW: 17.3 % — ABNORMAL HIGH (ref 11.5–15.5)
RDW: 17.2 % — ABNORMAL HIGH (ref 11.5–15.5)
WBC: 30.3 10*3/uL — ABNORMAL HIGH (ref 4.0–10.5)
WBC: 30.4 10*3/uL — ABNORMAL HIGH (ref 4.0–10.5)
nRBC: 0 % (ref 0.0–0.2)
nRBC: 0 % (ref 0.0–0.2)

## 2023-11-27 LAB — BPAM RBC
Blood Product Expiration Date: 202505082359
Blood Product Expiration Date: 202505082359
ISSUE DATE / TIME: 202504121252
ISSUE DATE / TIME: 202504121750
Unit Type and Rh: 6200
Unit Type and Rh: 6200

## 2023-11-27 LAB — HEMOGLOBIN AND HEMATOCRIT, BLOOD
HCT: 26.9 % — ABNORMAL LOW (ref 36.0–46.0)
HCT: 26.9 % — ABNORMAL LOW (ref 36.0–46.0)
Hemoglobin: 9.3 g/dL — ABNORMAL LOW (ref 12.0–15.0)
Hemoglobin: 9 g/dL — ABNORMAL LOW (ref 12.0–15.0)

## 2023-11-27 LAB — HEPARIN LEVEL (UNFRACTIONATED): Heparin Unfractionated: 0.87 [IU]/mL — ABNORMAL HIGH (ref 0.30–0.70)

## 2023-11-27 LAB — APTT: aPTT: 84 s — ABNORMAL HIGH (ref 24–36)

## 2023-11-27 MED ORDER — TECHNETIUM TC 99M-LABELED RED BLOOD CELLS IV KIT
25.0000 | PACK | Freq: Once | INTRAVENOUS | Status: AC | PRN
  Administered 2023-11-27: 23.72 via INTRAVENOUS

## 2023-11-27 MED ORDER — FOLIC ACID 1 MG PO TABS
1.0000 mg | ORAL_TABLET | Freq: Every day | ORAL | Status: DC
  Administered 2023-11-27 – 2023-11-30 (×3): 1 mg via ORAL
  Filled 2023-11-27 (×3): qty 1

## 2023-11-27 MED ORDER — POLYETHYLENE GLYCOL 3350 17 GM/SCOOP PO POWD
119.0000 g | Freq: Once | ORAL | Status: AC
  Administered 2023-11-27: 119 g via ORAL
  Filled 2023-11-27: qty 119

## 2023-11-27 MED ORDER — VITAMIN B-12 1000 MCG PO TABS
1000.0000 ug | ORAL_TABLET | Freq: Every day | ORAL | Status: DC
  Administered 2023-11-27 – 2023-11-30 (×3): 1000 ug via ORAL
  Filled 2023-11-27 (×3): qty 1

## 2023-11-27 NOTE — Progress Notes (Signed)
 [facility]  Daily Progress Note   Subjective  - 1 Day Post-Op  Patient in bed appears comfortable when I entered.  She does note that she is having severe pain in her left lower extremity.  She states that she did not take any pain medication through the night but is now finding it hard to keep up with the pain at this time.  Plans are for upper and lower endoscopies today or tomorrow.  Objective Vitals:   11/26/23 1818 11/26/23 2112 11/27/23 0416 11/27/23 0802  BP: 101/65 105/72 114/70 127/72  Pulse: 92 88 75 81  Resp: 17 16 16 17   Temp: 98 F (36.7 C) 98.6 F (37 C) 97.9 F (36.6 C) 98.2 F (36.8 C)  TempSrc:    Oral  SpO2: 99% 97% 95% 97%  Weight:      Height:        Intake/Output Summary (Last 24 hours) at 11/27/2023 1311 Last data filed at 11/27/2023 1023 Gross per 24 hour  Intake 1326.25 ml  Output --  Net 1326.25 ml    PULM  Normal effort , no use of accessory muscles CV  No JVD, RRR Abd      No distended, nontender VASC  left below-knee amputation dressing is clean dry and intact no evidence of blood staining  Laboratory CBC    Component Value Date/Time   WBC 30.3 (H) 11/27/2023 0535   HGB 9.3 (L) 11/27/2023 0955   HCT 26.9 (L) 11/27/2023 0955   PLT 644 (H) 11/27/2023 0535    BMET    Component Value Date/Time   NA 137 11/25/2023 0816   K 4.2 11/25/2023 0816   CL 109 11/25/2023 0816   CO2 24 11/25/2023 0816   GLUCOSE 118 (H) 11/25/2023 0816   BUN 15 11/25/2023 0816   CREATININE 0.73 11/25/2023 0816   CALCIUM 8.4 (L) 11/25/2023 0816   GFRNONAA >60 11/25/2023 0816   GFRAA >60 05/31/2016 1152    Assessment/Planning: POD # 1 s/p left below-knee amputation  Labs are reviewed her hemoglobin appears stable reported as 8.9 at 05 100 this morning and a repeat at 09 55 was 9.3. Continue GI workup as I feel it is very important to be able to maintain her on anticoagulation given her aorta iliac disease.  At this point I am  suspicious that she embolized her sigmoid colon via her IMA and has a mild case of ischemic colitis.  The aortic lesion I am talking about is right in that area and so this is definitely plausible   [name]  11/27/2023, 1:11 PM

## 2023-11-27 NOTE — Progress Notes (Addendum)
 Penny Oz, MD [address]  [address]  [place], [place] [address]  Main: [phone]  Fax: [phone] Pager: [phone]   Subjective: Patient started bowel prep today along with clear liquids.  Her first bowel movement since drinking bowel prep was associated with blood clots.  She denies any dizziness or lightheadedness.   Objective: Vital signs in last 24 hours: Vitals:   11/27/23 0416 11/27/23 0802 11/27/23 1528 11/27/23 1607  BP: 114/70 127/72 (!) 150/80 (!) 143/73  Pulse: 75 81 85 81  Resp: 16 17 17 18   Temp: 97.9 F (36.6 C) 98.2 F (36.8 C) 99.4 F (37.4 C) 98.6 F (37 C)  TempSrc:  Oral Oral Oral  SpO2: 95% 97% 97% 98%  Weight:      Height:       Weight change:   Intake/Output Summary (Last 24 hours) at 11/27/2023 1804 Last data filed at 11/27/2023 1508 Gross per 24 hour  Intake 1104.92 ml  Output --  Net 1104.92 ml     Exam: Heart: Regular rate and rhythm, S1S2 present, or without murmur or extra heart sounds Lungs: normal and clear to auscultation Abdomen: soft, nontender, normal bowel sounds   Lab Results:    Latest Ref Rng & Units 11/27/2023    4:06 PM 11/27/2023    9:55 AM 11/27/2023    5:35 AM  CBC  WBC 4.0 - 10.5 K/uL 30.4   30.3   Hemoglobin 12.0 - 15.0 g/dL 9.6  9.3  8.9   Hematocrit 36.0 - 46.0 % 28.4  26.9  26.8   Platelets 150 - 400 K/uL 623   644       Latest Ref Rng & Units 11/25/2023    8:16 AM 11/24/2023    2:54 AM 11/23/2023    9:49 AM  CMP  Glucose 70 - 99 mg/dL 962  952  841   BUN 6 - 20 mg/dL 15  15  14    Creatinine 0.44 - 1.00 mg/dL 3.24  4.01  0.27   Sodium 135 - 145 mmol/L 137  133  135   Potassium 3.5 - 5.1 mmol/L 4.2  3.8  3.9   Chloride 98 - 111 mmol/L 109  102  102   CO2 22 - 32 mmol/L 24  21  23    Calcium 8.9 - 10.3 mg/dL 8.4  8.3  8.7   Total Protein 6.5 - 8.1 g/dL  6.6  7.6   Total Bilirubin 0.0 - 1.2 mg/dL  0.5  0.3   Alkaline Phos 38 - 126 U/L  97  111   AST 15 - 41 U/L  28  38   ALT 0 - 44  U/L  27  33     Micro Results: Recent Results (from the past 240 hours)  Gastrointestinal Panel by PCR , Stool     Status: None   Collection Time: 11/23/23 11:16 AM   Specimen: Anus; Stool  Result Value Ref Range Status   Campylobacter species NOT DETECTED NOT DETECTED Final   Plesimonas shigelloides NOT DETECTED NOT DETECTED Final   Salmonella species NOT DETECTED NOT DETECTED Final   Yersinia enterocolitica NOT DETECTED NOT DETECTED Final   Vibrio species NOT DETECTED NOT DETECTED Final   Vibrio cholerae NOT DETECTED NOT DETECTED Final   Enteroaggregative E coli (EAEC) NOT DETECTED NOT DETECTED Final   Enteropathogenic E coli (EPEC) NOT DETECTED NOT DETECTED Final   Enterotoxigenic E coli (ETEC) NOT DETECTED NOT DETECTED Final  Shiga like toxin producing E coli (STEC) NOT DETECTED NOT DETECTED Final   Shigella/Enteroinvasive E coli (EIEC) NOT DETECTED NOT DETECTED Final   Cryptosporidium NOT DETECTED NOT DETECTED Final   Cyclospora cayetanensis NOT DETECTED NOT DETECTED Final   Entamoeba histolytica NOT DETECTED NOT DETECTED Final   Giardia lamblia NOT DETECTED NOT DETECTED Final   Adenovirus F40/41 NOT DETECTED NOT DETECTED Final   Astrovirus NOT DETECTED NOT DETECTED Final   Norovirus GI/GII NOT DETECTED NOT DETECTED Final   Rotavirus A NOT DETECTED NOT DETECTED Final   Sapovirus (I, II, IV, and V) NOT DETECTED NOT DETECTED Final    Comment: Performed at [facility], [address] [place], [place] [address]  C Difficile Quick Screen w PCR reflex     Status: None   Collection Time: 11/23/23 11:16 AM   Specimen: STOOL  Result Value Ref Range Status   C Diff antigen NEGATIVE NEGATIVE Final   C Diff toxin NEGATIVE NEGATIVE Final   C Diff interpretation No C. difficile detected.  Final    Comment: Performed at [facility], [address] [place], [place] [address]   Studies/Results: US  OR NERVE BLOCK-IMAGE ONLY [facility]) Result Date: 11/26/2023 There is  no interpretation for this exam.  This order is for images obtained during a surgical procedure.  Please See "Surgeries" Tab for more information regarding the procedure.   Medications: I have reviewed the patient's current medications. Prior to Admission:  Medications Prior to Admission  Medication Sig Dispense Refill Last Dose/Taking   apixaban (ELIQUIS) 5 MG TABS tablet Take 5 mg by mouth 2 (two) times daily.   11/23/2023 at  7:30 AM   aspirin 81 MG chewable tablet Chew 81 mg by mouth daily.   11/22/2023   citalopram (CELEXA) 20 MG tablet Take 20 mg by mouth daily.   11/23/2023   gabapentin (NEURONTIN) 600 MG tablet Take 600 mg by mouth 3 (three) times daily.   11/22/2023 Bedtime   hydrochlorothiazide (HYDRODIURIL) 12.5 MG tablet Take 12.5 mg by mouth daily.   11/23/2023   pravastatin (PRAVACHOL) 10 MG tablet Take 1 tablet by mouth daily.   11/22/2023 Bedtime   acetaminophen (TYLENOL) 500 MG tablet Take 500 mg by mouth every 6 (six) hours as needed for moderate pain. (Patient not taking: Reported on 07/20/2022)      celecoxib (CELEBREX) 100 MG capsule Take 100 mg by mouth 2 (two) times daily. (Patient not taking: Reported on 11/23/2023)   Not Taking   Docusate Sodium (DSS) 100 MG CAPS Take 100 mg by mouth 2 (two) times daily. (Patient not taking: Reported on 11/23/2023)   Not Taking   [EXPIRED] oxyCODONE (OXY IR/ROXICODONE) 5 MG immediate release tablet Take 5 mg by mouth every 6 (six) hours as needed for moderate pain (pain score 4-6). (Patient not taking: Reported on 11/23/2023)   Not Taking   polyethylene glycol (MIRALAX / GLYCOLAX) 17 g packet Take 17 g by mouth 3 (three) times daily with meals. (Patient not taking: Reported on 11/23/2023)   Not Taking   senna (SENOKOT) 8.6 MG tablet Take 1 tablet by mouth at bedtime. (Patient not taking: Reported on 11/23/2023)   Not Taking   tiZANidine (ZANAFLEX) 2 MG tablet Take 2 mg by mouth 2 (two) times daily. (Patient not taking: Reported on 11/23/2023)   Not Taking    Scheduled:  aspirin EC  81 mg Oral Daily   citalopram  20 mg Oral Daily   vitamin B-12  1,000  mcg Oral Daily   feeding supplement  1 Container Oral TID BM   folic acid  1 mg Oral Daily   gabapentin  600 mg Oral TID   nicotine  21 mg Transdermal Daily   pantoprazole  40 mg Oral BID AC   pravastatin  10 mg Oral Daily   Continuous:   ceFAZolin (ANCEF) IV 1 g (11/27/23 1402)   heparin 1,800 Units/hr (11/27/23 1508)   JXB:[id number], HYDROmorphone (DILAUDID) injection, nicotine polacrilex, ondansetron **OR** [DISCONTINUED] ondansetron (ZOFRAN) IV, oxyCODONE Anti-infectives (From admission, onward)    Start     Dose/Rate Route Frequency Ordered Stop   11/27/23 0600  ceFAZolin (ANCEF) IVPB 1 g/50 mL premix       Note to Pharmacy: Send with pt to OR   1 g 100 mL/hr over 30 Minutes Intravenous Every 8 hours 11/26/23 1115 11/28/23 0559   11/25/23 2036  ceFAZolin (ANCEF) IVPB 2g/100 mL premix        2 g 200 mL/hr over 30 Minutes Intravenous 30 min pre-op 11/25/23 2036 11/26/23 0903   11/24/23 1634  ceFAZolin (ANCEF) IVPB 1 g/50 mL premix        1 g 100 mL/hr over 30 Minutes Intravenous 60 min pre-op 11/24/23 1632 11/24/23 1654      Scheduled Meds:  aspirin EC  81 mg Oral Daily   citalopram  20 mg Oral Daily   feeding supplement  1 Container Oral TID BM   gabapentin  600 mg Oral TID   nicotine  21 mg Transdermal Daily   pantoprazole  40 mg Oral BID AC   pravastatin  10 mg Oral Daily   Continuous Infusions:   ceFAZolin (ANCEF) IV 1 g (11/27/23 1402)   heparin 1,800 Units/hr (11/27/23 1508)   PRN Meds:.acetaminophen, HYDROmorphone (DILAUDID) injection, nicotine polacrilex, ondansetron **OR** [DISCONTINUED] ondansetron (ZOFRAN) IV, oxyCODONE   Assessment: Principal Problem:   Limb ischemia Active Problems:   Gastroesophageal reflux disease without esophagitis   HTN (hypertension), benign   Gangrene of left foot (HCC)   Tobacco abuse   PE (pulmonary thromboembolism)  (HCC)   GI bleeding   Substance abuse (HCC)   Leukocytosis   Blister of left foot   Bloody stools   Rectal bleeding   Acute blood loss anemia  Penny Chase is a 53 y.o. female with chronic tobacco use, hypertension, past history of breast cancer survivor with recent diagnosis of multiple subsegmental PEs at [facility], left lower extremity ischemia, s/p thrombectomy on Eliquis and aspirin 81 mg daily is admitted at [facility] secondary to worsening of left lower extremity limb ischemia, s/p angiography followed by left below-knee amputation on 4/12. GI is consulted for acute blood loss anemia, ongoing bright red blood per rectum as well as diarrhea   Plan: Acute blood loss anemia with hematochezia and diarrhea Stool studies negative for infection including C. difficile Patient started bowel prep today and her first bowel movement was large amount of rectal bleeding with clots.  Her vitals were stable post event along with repeat hemoglobin.  She is undergoing tagged RBC scan currently Continue bowel prep after the scan if the results are negative Will proceed with upper endoscopy, colonoscopy and possible video capsule endoscopy tentatively Monday or Tuesday pending if patient is adequately prepped and stools are clear Continue Protonix 40 mg p.o. twice daily Monitor CBC closely to maintain hemoglobin above 8 Has folic acid as well as mild M57 deficiency, recommend folic acid and B12 supplements daily Continue clear liquid diet  N.p.o. effective 5 AM tomorrow If she is undergoing endoscopic procedures tomorrow, heparin drip needs to be held at least for 2 hours before the procedures Dr. Ole Berkeley will cover from tomorrow   LOS: 3 days   Emmette Katt 11/27/2023, 6:04 PM

## 2023-11-27 NOTE — Plan of Care (Signed)
  Problem: Education: Goal: Knowledge of General Education information will improve Description: Including pain rating scale, medication(s)/side effects and non-pharmacologic comfort measures Outcome: Progressing   Problem: Clinical Measurements: Goal: Will remain free from infection Outcome: Progressing Goal: Diagnostic test results will improve Outcome: Progressing   Problem: Nutrition: Goal: Adequate nutrition will be maintained Outcome: Progressing   Problem: Pain Managment: Goal: General experience of comfort will improve and/or be controlled Outcome: Progressing

## 2023-11-27 NOTE — Progress Notes (Signed)
 Rn reported large BM with significant amount of Blood [facility] aware. Will order Stat GI bleeding scan and CBC. Last BP around 3 pm stable

## 2023-11-27 NOTE — Consult Note (Signed)
 PHARMACY - ANTICOAGULATION CONSULT NOTE  Pharmacy Consult for Heparin infusion Indication: PE and low extremity ischemia  Allergies  Allergen Reactions   Amlodipine Swelling   Patient Measurements: Height: 5\' 4"  (162.6 cm) Weight: 81.6 kg (180 lb) IBW/kg (Calculated) : 54.7 HEPARIN DW (KG): 72.4  Vital Signs: Temp: 97.9 F (36.6 C) (04/13 0416) BP: 114/70 (04/13 0416) Pulse Rate: 75 (04/13 0416)  Labs: Recent Labs    11/25/23 0816 11/25/23 1427 11/25/23 2250 11/26/23 0526 11/26/23 1014 11/26/23 1713 11/26/23 2222 11/27/23 0535  HGB 9.3*  --   --  8.3* 6.5* 8.1* 8.6* 8.9*  HCT 27.4*  --   --  25.0* 19.8* 23.8* 25.6* 26.8*  PLT 838*  --   --  803* 589* 660*  --  644*  APTT 72*   < > 86* 87*  --   --   --  84*  HEPARINUNFRC 0.85*  --   --  0.78*  --   --   --  0.87*  CREATININE 0.73  --   --   --   --   --   --   --    < > = values in this interval not displayed.   Estimated Creatinine Clearance: 83.1 mL/min (by C-G formula based on SCr of 0.73 mg/dL).  Medical History: Past Medical History:  Diagnosis Date   Breast cancer (HCC)    Cancer (HCC) 09/2018   Right breast   COVID-19 11/2018   Family history of breast cancer    Hypertension    Personal history of radiation therapy 2020   Rt breast    Medications:  Apixaban 5mg  po BID - last dose 4/9 @ 0730 per med hx  Assessment: 53 yo fe,ale patient with PMH of hypertension, GERD, bipolar disorder, breast cancer, recent admission to [facility] for PE and arterial occlusion of left lower extremity. Present to ED with foot pain and blood in stools. Pharmacy consulted to initiate heparin infusion per vascular provider. Per their note on 4/11, patient will undergo LLE BKA on 4/12.  Per Dr Prescilla Brod: give 1/2 the calculated bolus and OK to follow nomogram thereafter.  Noted last apixaban given > 12 hours ago. aPTT 30, INR 1.1, hgb 11.4 Diverticulitis found on CT.  4/10 @ 0254: aPTT = 34, HL = 0.78  SUBtherapeutic, not  correlating 4/11 @ 0000: aPTT = 42, HL = 0.54  SUBtherapeutic, not correlating  4/11 @ 0816: aPTT = 72, HL = 0.85  Therapeutic x 1, not correlating 4/11 @ 1427: aPTT = 59   SUBtherapeutic 4/12 @ 0526: aPTT = 87,   HL = 0.78 4/13 @ 0535: aPTT = 84,   HL = 0.87           Therapeutic X 3  Goal of Therapy:  Heparin level 0.3-0.7 units/ml aPTT 66-102 seconds Monitor platelets by anticoagulation protocol: Yes   Plan:  4/13 @ 0535:  aPTT = 84, HL = 0.87 - aPTT therapeutic X 3,  HL still slightly elevated from Eliquis PTA - Will continue pt on current rate and recheck aPTT and HL on 4/14 with AM labs Heparin level with AM labs tomorrow to assess for correlation. Continue to monitor CBC daily while on heparin infusion.  Kevina Piloto D Clinical Pharmacist 11/27/2023 6:37 AM  .Sherril Dole

## 2023-11-27 NOTE — Progress Notes (Signed)
 [facility]  - [facility]   PATIENT NAME: Penny Chase    MR#:  [id number]  DATE OF BIRTH:  31-Jan-1971  SUBJECTIVE:  no family at bedside. Complains of being on the amputated leg. Had one bloodied stool mild to moderate amount per RN this morning. IV heparin drip being continued. No vomiting.  VITALS:  Blood pressure 127/72, pulse 81, temperature 98.2 F (36.8 C), temperature source Oral, resp. rate 17, height 5\' 4"  (1.626 m), weight 81.6 kg, last menstrual period 09/20/2018, SpO2 97%.  PHYSICAL EXAMINATION:   GENERAL:  53 y.o.-year-old patient with no acute distress.  LUNGS: Normal breath sounds bilaterally, no wheezing CARDIOVASCULAR: S1, S2 normal. No murmur   ABDOMEN: Soft, nontender, nondistended. Bowel sounds present.  EXTREMITIES: left BKA dressing+ NEUROLOGIC: nonfocal  patient is alert and awake  LABORATORY PANEL:  CBC Recent Labs  Lab 11/27/23 0535 11/27/23 0955  WBC 30.3*  --   HGB 8.9* 9.3*  HCT 26.8* 26.9*  PLT 644*  --     Chemistries  Recent Labs  Lab 11/24/23 0254 11/25/23 0816  NA 133* 137  K 3.8 4.2  CL 102 109  CO2 21* 24  GLUCOSE 124* 118*  BUN 15 15  CREATININE 0.68 0.73  CALCIUM 8.3* 8.4*  MG  --  2.2  AST 28  --   ALT 27  --   ALKPHOS 97  --   BILITOT 0.5  --     Assessment and Plan  Penny Chase is a 53 y.o. female with medical history significant of PE, hypertension, tobacco abuse, substance abuse, history of right breast cancer presenting with left lower extremity ischemia, GI bleeding, diverticulitis.  Patient reports worsening left foot pain redness and swelling over the past 1 to 2 days.  Noted to have been recently evaluated in the [facility] system March 29 through April 7 with issues including multiple subsegmental PEs, lower extremity arterial occlusion status post thrombectomy.     Limb ischemia Gangrene of left foot  --Worsening left lower extremity distal foot redness and discoloration concerning  for limb ischemia and dry gangrene Noted recent admission in the [facility] system for similar issues with patient refusing amputation. --Procedures at [facility] included iliac thrombus in addition to thrombus in the AT and PT. S/p L CFA/AT/PT cutdown, L AT/PT thromboembolectomy 11/14/23-patient refused amputation --per podiatry, no need for abx --angiogram on 4/10 with no intervention preformed --s/p left BKA per Dr Prescilla Brod  Anemia of chronic disease -- came in with hemoglobin of 11.4--- 6.5-- 2 Unit PRBC--8.9--9.3   PAD --per vascular, "plan for distal aortic stenting with coverage of the common iliac arteries in 4 to 6 weeks once the left groin has healed." --cont ASA and statin   PE (pulmonary thromboembolism) (HCC) Left LE DVT/Arterial thrombus underwent procedure at [facility] Noted recent diagnosis in the [facility] system with PE --hold home Eliquis --cont heparin gtt for now since patient is reporting bright red blood per rectum occasionally.    Leukocytosis Marked white count of 28 on presentation Suspect multifactorial with reactive component given multiple recent vascular procedures as well as suspected diverticulitis on CT scan   Lower GI bleeding suspected ischemic colitis Noted bloody bowel movements x 12 to 24 hours in the setting of anticoagulation use CT imaging with noted diverticulitis --monitor Hgb -- G.I. consultation with Dr. Baldomero Bone -- will continue IV heparin drip given limb ischemia, PE, DVT, arterial thrombus. -- Patient understands the risk and complications of being of  blood thinner and G.I. bleed --Stool studies negative --Dr Baldomero Bone plans EGD colonoscopy tomorrow   Substance abuse (HCC) Pt self admits to MJ use   Tobacco abuse --1 pack/day smoker Discussed cessation at length --cont nicotine patch and gum   HTN (hypertension), benign --BP stable     DVT prophylaxis: UU:VOZDGUY gtt Code Status: Full code  Family Communication: none today Consult: vascular, GI Level of  care: Med-Surg Dispo:   The patient is from: home Anticipated d/c is to: TBD Anticipated d/c date is: > 3 days       TOTAL TIME TAKING CARE OF THIS PATIENT: 40 minutes.  >50% time spent on counselling and coordination of care  Note: This dictation was prepared with Dragon dictation along with smaller phrase technology. Any transcriptional errors that result from this process are unintentional.  Penny Chase M.D    [facility]   CC: Primary care physician; Center, [facility]

## 2023-11-27 NOTE — Plan of Care (Signed)
   Problem: Education: Goal: Knowledge of General Education information will improve Description: Including pain rating scale, medication(s)/side effects and non-pharmacologic comfort measures Outcome: Progressing   Problem: Activity: Goal: Risk for activity intolerance will decrease Outcome: Progressing

## 2023-11-27 NOTE — Progress Notes (Signed)
 OT Cancellation Note  Patient Details Name: Penny Chase MRN: [id number] DOB: Mar 20, 1971   Cancelled Treatment:    Reason Eval/Treat Not Completed: Medical issues which prohibited therapy. Consult received, chart reviewed. Concern for GIB, stat scan ordered. Will hold OT evaluation this date and re-attempt as medically appropriate next date.   Marlayna Bannister R., MPH, MS, OTR/L ascom [phone] 11/27/23, 4:29 PM

## 2023-11-28 ENCOUNTER — Encounter
Admission: EM | Disposition: A | Discharge status: Home Health | Payer: Self-pay | Source: Home / Self Care | Attending: Internal Medicine

## 2023-11-28 ENCOUNTER — Inpatient Hospital Stay: Admitting: General Practice

## 2023-11-28 DIAGNOSIS — K559 Vascular disorder of intestine, unspecified: Secondary | ICD-10-CM

## 2023-11-28 DIAGNOSIS — K5730 Diverticulosis of large intestine without perforation or abscess without bleeding: Secondary | ICD-10-CM

## 2023-11-28 DIAGNOSIS — K2980 Duodenitis without bleeding: Secondary | ICD-10-CM | POA: Diagnosis not present

## 2023-11-28 DIAGNOSIS — K922 Gastrointestinal hemorrhage, unspecified: Secondary | ICD-10-CM | POA: Diagnosis not present

## 2023-11-28 DIAGNOSIS — Z87891 Personal history of nicotine dependence: Secondary | ICD-10-CM

## 2023-11-28 DIAGNOSIS — K633 Ulcer of intestine: Secondary | ICD-10-CM | POA: Diagnosis not present

## 2023-11-28 DIAGNOSIS — K921 Melena: Secondary | ICD-10-CM

## 2023-11-28 DIAGNOSIS — I743 Embolism and thrombosis of arteries of the lower extremities: Secondary | ICD-10-CM | POA: Diagnosis not present

## 2023-11-28 DIAGNOSIS — Z89512 Acquired absence of left leg below knee: Secondary | ICD-10-CM | POA: Diagnosis not present

## 2023-11-28 DIAGNOSIS — K259 Gastric ulcer, unspecified as acute or chronic, without hemorrhage or perforation: Secondary | ICD-10-CM | POA: Diagnosis not present

## 2023-11-28 DIAGNOSIS — K2971 Gastritis, unspecified, with bleeding: Secondary | ICD-10-CM

## 2023-11-28 DIAGNOSIS — K269 Duodenal ulcer, unspecified as acute or chronic, without hemorrhage or perforation: Secondary | ICD-10-CM

## 2023-11-28 DIAGNOSIS — D62 Acute posthemorrhagic anemia: Secondary | ICD-10-CM | POA: Diagnosis not present

## 2023-11-28 DIAGNOSIS — I2699 Other pulmonary embolism without acute cor pulmonale: Secondary | ICD-10-CM | POA: Diagnosis not present

## 2023-11-28 DIAGNOSIS — Z9889 Other specified postprocedural states: Secondary | ICD-10-CM | POA: Diagnosis not present

## 2023-11-28 DIAGNOSIS — K55059 Acute (reversible) ischemia of intestine, part and extent unspecified: Secondary | ICD-10-CM | POA: Diagnosis not present

## 2023-11-28 HISTORY — PX: GIVENS CAPSULE STUDY: SHX5432

## 2023-11-28 HISTORY — PX: IVC FILTER INSERTION: CATH118245

## 2023-11-28 HISTORY — PX: COLONOSCOPY: SHX5424

## 2023-11-28 HISTORY — PX: ESOPHAGOGASTRODUODENOSCOPY: SHX5428

## 2023-11-28 LAB — CBC
HCT: 23.6 % — ABNORMAL LOW (ref 36.0–46.0)
Hemoglobin: 7.9 g/dL — ABNORMAL LOW (ref 12.0–15.0)
MCH: 31.1 pg (ref 26.0–34.0)
MCHC: 33.5 g/dL (ref 30.0–36.0)
MCV: 92.9 fL (ref 80.0–100.0)
Platelets: 585 10*3/uL — ABNORMAL HIGH (ref 150–400)
RBC: 2.54 MIL/uL — ABNORMAL LOW (ref 3.87–5.11)
RDW: 16.5 % — ABNORMAL HIGH (ref 11.5–15.5)
WBC: 29.2 10*3/uL — ABNORMAL HIGH (ref 4.0–10.5)
nRBC: 0 % (ref 0.0–0.2)

## 2023-11-28 LAB — APTT: aPTT: 90 s — ABNORMAL HIGH (ref 24–36)

## 2023-11-28 LAB — HEMOGLOBIN AND HEMATOCRIT, BLOOD
HCT: 23.2 % — ABNORMAL LOW (ref 36.0–46.0)
HCT: 21.6 % — ABNORMAL LOW (ref 36.0–46.0)
Hemoglobin: 7.5 g/dL — ABNORMAL LOW (ref 12.0–15.0)
Hemoglobin: 7.1 g/dL — ABNORMAL LOW (ref 12.0–15.0)

## 2023-11-28 LAB — HEPARIN LEVEL (UNFRACTIONATED): Heparin Unfractionated: 0.64 [IU]/mL (ref 0.30–0.70)

## 2023-11-28 SURGERY — IVC FILTER INSERTION
Anesthesia: Moderate Sedation

## 2023-11-28 SURGERY — EGD (ESOPHAGOGASTRODUODENOSCOPY)
Anesthesia: General

## 2023-11-28 MED ORDER — DIPHENHYDRAMINE HCL 50 MG/ML IJ SOLN: 50.0000 mg | Freq: Once | INTRAMUSCULAR | Status: DC | PRN

## 2023-11-28 MED ORDER — METHYLPREDNISOLONE SODIUM SUCC 125 MG IJ SOLR
125.0000 mg | Freq: Once | INTRAMUSCULAR | Status: DC | PRN
Start: 2023-11-28 — End: 2023-11-28

## 2023-11-28 MED ORDER — PROPOFOL 10 MG/ML IV BOLUS
INTRAVENOUS | Status: DC | PRN
Start: 2023-11-28 — End: 2023-11-28
  Administered 2023-11-28: 120 ug/kg/min via INTRAVENOUS
  Administered 2023-11-28: 80 mg via INTRAVENOUS

## 2023-11-28 MED ORDER — MIDAZOLAM HCL 2 MG/ML PO SYRP
8.0000 mg | ORAL_SOLUTION | Freq: Once | ORAL | Status: DC | PRN
  Filled 2023-11-28: qty 5

## 2023-11-28 MED ORDER — DEXMEDETOMIDINE HCL IN NACL 80 MCG/20ML IV SOLN
INTRAVENOUS | Status: DC | PRN
Start: 2023-11-28 — End: 2023-11-28
  Administered 2023-11-28: 8 ug via INTRAVENOUS

## 2023-11-28 MED ORDER — PANTOPRAZOLE SODIUM 40 MG IV SOLR
40.0000 mg | Freq: Two times a day (BID) | INTRAVENOUS | Status: DC
  Administered 2023-11-28 – 2023-11-30 (×5): 40 mg via INTRAVENOUS
  Filled 2023-11-28 (×5): qty 10

## 2023-11-28 MED ORDER — MIDAZOLAM HCL 2 MG/2ML IJ SOLN
INTRAMUSCULAR | Status: DC | PRN
  Administered 2023-11-28: .5 mg via INTRAVENOUS

## 2023-11-28 MED ORDER — HEPARIN (PORCINE) IN NACL 1000-0.9 UT/500ML-% IV SOLN
INTRAVENOUS | Status: DC | PRN
  Administered 2023-11-28: 500 mL

## 2023-11-28 MED ORDER — SODIUM CHLORIDE 0.9 % IV SOLN: INTRAVENOUS | Status: DC

## 2023-11-28 MED ORDER — MIDAZOLAM HCL 2 MG/2ML IJ SOLN
INTRAMUSCULAR | Status: AC
  Filled 2023-11-28: qty 2

## 2023-11-28 MED ORDER — PHENYLEPHRINE 80 MCG/ML (10ML) SYRINGE FOR IV PUSH (FOR BLOOD PRESSURE SUPPORT)
PREFILLED_SYRINGE | INTRAVENOUS | Status: DC | PRN
  Administered 2023-11-28 (×2): 80 ug via INTRAVENOUS

## 2023-11-28 MED ORDER — CEFAZOLIN SODIUM-DEXTROSE 2-4 GM/100ML-% IV SOLN
INTRAVENOUS | Status: AC
  Filled 2023-11-28: qty 100

## 2023-11-28 MED ORDER — LIDOCAINE HCL (PF) 1 % IJ SOLN
INTRAMUSCULAR | Status: DC | PRN
  Administered 2023-11-28: 5 mL via INTRADERMAL

## 2023-11-28 MED ORDER — FENTANYL CITRATE (PF) 100 MCG/2ML IJ SOLN
INTRAMUSCULAR | Status: AC
  Filled 2023-11-28: qty 2

## 2023-11-28 MED ORDER — SODIUM CHLORIDE 0.9% IV SOLUTION: Freq: Once | INTRAVENOUS | Status: AC

## 2023-11-28 MED ORDER — CEFAZOLIN SODIUM-DEXTROSE 2-4 GM/100ML-% IV SOLN: 2.0000 g | INTRAVENOUS | Status: DC

## 2023-11-28 MED ORDER — FENTANYL CITRATE (PF) 100 MCG/2ML IJ SOLN
INTRAMUSCULAR | Status: DC | PRN
  Administered 2023-11-28: 12.5 ug via INTRAVENOUS

## 2023-11-28 MED ORDER — FENTANYL CITRATE PF 50 MCG/ML IJ SOSY: 12.5000 ug | PREFILLED_SYRINGE | Freq: Once | INTRAMUSCULAR | Status: DC | PRN

## 2023-11-28 MED ORDER — FAMOTIDINE 20 MG PO TABS
40.0000 mg | ORAL_TABLET | Freq: Once | ORAL | Status: DC | PRN
Start: 2023-11-28 — End: 2023-11-28

## 2023-11-28 MED ORDER — SUCRALFATE 1 GM/10ML PO SUSP
1.0000 g | Freq: Three times a day (TID) | ORAL | Status: DC
  Administered 2023-11-28 – 2023-11-30 (×7): 1 g via ORAL
  Filled 2023-11-28 (×8): qty 10

## 2023-11-28 MED ORDER — IODIXANOL 320 MG/ML IV SOLN
INTRAVENOUS | Status: DC | PRN
Start: 2023-11-28 — End: 2023-11-28
  Administered 2023-11-28: 10 mL via INTRAVENOUS

## 2023-11-28 MED ORDER — LIDOCAINE HCL (CARDIAC) PF 100 MG/5ML IV SOSY
PREFILLED_SYRINGE | INTRAVENOUS | Status: DC | PRN
  Administered 2023-11-28: 50 mg via INTRAVENOUS

## 2023-11-28 SURGICAL SUPPLY — 5 items
COVER PROBE ULTRASOUND 5X96 (MISCELLANEOUS) IMPLANT
KIT FEMORAL DEL DENALI (Miscellaneous) IMPLANT
PACK ANGIOGRAPHY (CUSTOM PROCEDURE TRAY) ×1 IMPLANT
SET INTRO CAPELLA COAXIAL (SET/KITS/TRAYS/PACK) IMPLANT
WIRE SUPRACORE 190CM (WIRE) IMPLANT

## 2023-11-28 NOTE — Progress Notes (Signed)
 PT Cancellation Note  Patient Details Name: Penny Chase MRN: [id number] DOB: [birth year]   Cancelled Treatment:    Reason Eval/Treat Not Completed: Patient at procedure or test/unavailable;Other (comment) PT orders received, chart reviewed. Pt noted to be groggy 2/2 receiving morphine this AM & anticipating colonoscopy at 9AM. Will f/u as able.  Emaline Handsome, PT, DPT 11/28/23, 9:00 AM   Venetta Gill 11/28/2023, 8:59 AM

## 2023-11-28 NOTE — Consult Note (Signed)
 SURGICAL CONSULTATION NOTE   HISTORY OF PRESENT ILLNESS (HPI):  53 y.o. female presented to [facility] ED for evaluation of worsening left leg pain.  Patient with complicated history of peripheral artery disease with very complex vascular procedures to try to improve her blood flow to the left leg.  Upon workup at outside institution for the lower peripheral artery disease she was also found with pulmonary embolism and DVT and she was started on blood thinners.  She had arteriogram of the left lower extremity but no intervention was performed.  Due to unable to revascularize patient had left leg below the knee amputation.  She has also also been complaining of rectal bleeding.  Due to decrease in hemoglobin she had a colonoscopy yesterday shows large bowel ischemia mainly from the hepatic flexure down to the descending colon.  No active bleeding was identified.  Patient had previous bleeding scan and CTA without evidence of active bleeding.  Colonoscopy shows stigmata changes of previous bleeding.  I personally evaluated the images.  Her hemoglobin has been trending down.  Hemoglobin on admission was 11.4.  In the last 2 days he has been trending down to 9 and today is 7.1.  Due to the concern of bleeding and unable to continue anticoagulation vascular surgery proceeded to place inferior vena cava filter due to known lower extremity DVT.  Patient denies abdominal pain  PAST MEDICAL HISTORY (PMH):  Past Medical History:  Diagnosis Date   Breast cancer (HCC)    Cancer (HCC) 09/2018   Right breast   COVID-19 11/2018   Family history of breast cancer    Hypertension    Personal history of radiation therapy 2020   Rt breast     PAST SURGICAL HISTORY (PSH):  Past Surgical History:  Procedure Laterality Date   AMPUTATION Left 11/26/2023   Procedure: AMPUTATION BELOW KNEE;  Surgeon: Jackquelyn Mass, MD;  Location: [facility] ORS;  Service: Vascular;  Laterality: Left;   BREAST BIOPSY Right 09/12/2018    us  core/"venus clip"-INVASIVE MAMMARYLobular CARCINOMA, WITH LOBULAR FEATURES   BREAST LUMPECTOMY Right 10/04/2018   BREAST LUMPECTOMY WITH NEEDLE LOCALIZATION AND AXILLARY SENTINEL LYMPH NODE BX Right 10/04/2018   Procedure: BREAST LUMPECTOMY WITH NEEDLE LOCALIZATION AND AXILLARY SENTINEL LYMPH NODE BX;  Surgeon: Eldred Grego, MD;  Location: [facility] ORS;  Service: General;  Laterality: Right;   LOWER EXTREMITY ANGIOGRAPHY Left 11/24/2023   Procedure: Lower Extremity Angiography;  Surgeon: Jackquelyn Mass, MD;  Location: [facility] INVASIVE CV LAB;  Service: Cardiovascular;  Laterality: Left;   TONSILLECTOMY       MEDICATIONS:  Prior to Admission medications   Medication Sig Start Date End Date Taking? Authorizing Provider  apixaban (ELIQUIS) 5 MG TABS tablet Take 5 mg by mouth 2 (two) times daily. 11/21/23 11/20/24 Yes [provider]  aspirin 81 MG chewable tablet Chew 81 mg by mouth daily. 11/22/23 12/22/23 Yes [provider]  citalopram (CELEXA) 20 MG tablet Take 20 mg by mouth daily.   Yes [provider]  gabapentin (NEURONTIN) 600 MG tablet Take 600 mg by mouth 3 (three) times daily.   Yes [provider]  hydrochlorothiazide (HYDRODIURIL) 12.5 MG tablet Take 12.5 mg by mouth daily.   Yes [provider]  pravastatin (PRAVACHOL) 10 MG tablet Take 1 tablet by mouth daily.   Yes [provider]  acetaminophen (TYLENOL) 500 MG tablet Take 500 mg by mouth every 6 (six) hours as needed for moderate pain. Patient not taking: Reported on 07/20/2022  [provider]  celecoxib (CELEBREX) 100 MG capsule Take 100 mg by mouth 2 (two) times daily. Patient not taking: Reported on 11/23/2023 11/13/23   [provider]  Docusate Sodium (DSS) 100 MG CAPS Take 100 mg by mouth 2 (two) times daily. Patient not taking: Reported on 11/23/2023 11/21/23 12/01/23  [provider]  polyethylene glycol (MIRALAX / GLYCOLAX) 17 g packet Take  17 g by mouth 3 (three) times daily with meals. Patient not taking: Reported on 11/23/2023 11/21/23 12/01/23  [provider]  senna (SENOKOT) 8.6 MG tablet Take 1 tablet by mouth at bedtime. Patient not taking: Reported on 11/23/2023 11/21/23 11/28/23  [provider]  tiZANidine (ZANAFLEX) 2 MG tablet Take 2 mg by mouth 2 (two) times daily. Patient not taking: Reported on 11/23/2023 09/27/23   [provider]     ALLERGIES:  Allergies  Allergen Reactions   Amlodipine Swelling     SOCIAL HISTORY:  Social History   Socioeconomic History   Marital status: Married    Spouse name: Not on file   Number of children: Not on file   Years of education: Not on file   Highest education level: Not on file  Occupational History   Not on file  Tobacco Use   Smoking status: Every Day    Current packs/day: 1.00    Average packs/day: 1 pack/day for 15.0 years (15.0 ttl pk-yrs)    Types: Cigarettes   Smokeless tobacco: Never   Tobacco comments:    patient declined  Vaping Use   Vaping status: Former  Substance and Sexual Activity   Alcohol use: Not Currently    Comment: not taken for about 6 weeks   Drug use: Not Currently    Types: Marijuana    Comment: Uses street opioids   Sexual activity: Not Currently  Other Topics Concern   Not on file  Social History Narrative   Works at [employer];  Smoke 15 cig/day; No alcohol abuse; in [employer]; with husband.    Social Drivers of Corporate investment banker Strain: Low Risk  (11/15/2023)   Received from [facility]   Overall Financial Resource Strain (CARDIA)    Difficulty of Paying Living Expenses: Not very hard  Food Insecurity: Patient Unable To Answer (11/25/2023)   Hunger Vital Sign    Worried About Running Out of Food in the Last Year: Patient unable to answer    Ran Out of Food in the Last Year: Patient unable to answer  Transportation Needs: Patient Unable To Answer (11/25/2023)   PRAPARE -  Transportation    Lack of Transportation (Medical): Patient unable to answer    Lack of Transportation (Non-Medical): Patient unable to answer  Physical Activity: Not on file  Stress: Not on file  Social Connections: Not on file  Intimate Partner Violence: Patient Unable To Answer (11/25/2023)   Humiliation, Afraid, Rape, and Kick questionnaire    Fear of Current or Ex-Partner: Patient unable to answer    Emotionally Abused: Patient unable to answer    Physically Abused: Patient unable to answer    Sexually Abused: Patient unable to answer      FAMILY HISTORY:  Family History  Problem Relation Age of Onset   Breast cancer Paternal Aunt      REVIEW OF SYSTEMS:  Constitutional: denies weight loss, fever, chills, or sweats  Eyes: denies any other vision changes, history of eye injury  ENT: denies sore throat, hearing problems  Respiratory: denies shortness of breath, wheezing  Cardiovascular: denies chest pain, palpitations  Gastrointestinal: Denies abdominal pain, nausea and vomiting Genitourinary: denies burning with urination or urinary frequency Musculoskeletal: Positive for lower extremity pain. Skin: denies any other rashes or skin discolorations  Neurological: denies any other headache, dizziness, weakness  Psychiatric: denies any other depression, anxiety   All other review of systems were negative   VITAL SIGNS:  Temp:  [90 F (32.2 C)-99.1 F (37.3 C)] 98.4 F (36.9 C) (04/14 1602) Pulse Rate:  [84-104] 85 (04/14 1602) Resp:  [12-20] 12 (04/14 1602) BP: (99-150)/(44-89) 101/72 (04/14 1602) SpO2:  [95 %-100 %] 95 % (04/14 1602)     Height: 5\' 4"  (162.6 cm) Weight: 81.6 kg BMI (Calculated): 30.88   INTAKE/OUTPUT:  This shift: Total I/O In: 250 [I.V.:250] Out: 10 [Blood:10]  Last 2 shifts: @IOLAST2SHIFTS @   PHYSICAL EXAM:  Constitutional:  -- Normal body habitus  -- Awake, alert, and oriented x3  Eyes:  -- Pupils equally round and reactive to light  -- No  scleral icterus  Ear, nose, and throat:  -- No jugular venous distension  Pulmonary:  -- No crackles  -- Equal breath sounds bilaterally -- Breathing non-labored at rest Cardiovascular:  -- S1, S2 present  -- No pericardial rubs Gastrointestinal:  -- Abdomen soft, nontender, non-distended, no guarding or rebound tenderness -- No abdominal masses appreciated, pulsatile or otherwise  Musculoskeletal and Integumentary:  -- Wounds: Left lower extremity wound dry and clean -- Extremities: Left lower extremity below the knee stump without sign of ischemia.  Right right leg without sign of ischemia. Neurologic:  -- Motor function: intact and symmetric -- Sensation: intact and symmetric   Labs:     Latest Ref Rng & Units 11/28/2023    9:48 AM 11/28/2023    5:29 AM 11/27/2023    9:45 PM  CBC  WBC 4.0 - 10.5 K/uL  29.2    Hemoglobin 12.0 - 15.0 g/dL 7.5  7.9  9.0   Hematocrit 36.0 - 46.0 % 23.2  23.6  26.9   Platelets 150 - 400 K/uL  585        Latest Ref Rng & Units 11/25/2023    8:16 AM 11/24/2023    2:54 AM 11/23/2023    9:49 AM  CMP  Glucose 70 - 99 mg/dL 409  811  914   BUN 6 - 20 mg/dL 15  15  14    Creatinine 0.44 - 1.00 mg/dL 7.82  9.56  2.13   Sodium 135 - 145 mmol/L 137  133  135   Potassium 3.5 - 5.1 mmol/L 4.2  3.8  3.9   Chloride 98 - 111 mmol/L 109  102  102   CO2 22 - 32 mmol/L 24  21  23    Calcium 8.9 - 10.3 mg/dL 8.4  8.3  8.7   Total Protein 6.5 - 8.1 g/dL  6.6  7.6   Total Bilirubin 0.0 - 1.2 mg/dL  0.5  0.3   Alkaline Phos 38 - 126 U/L  97  111   AST 15 - 41 U/L  28  38   ALT 0 - 44 U/L  27  33     Imaging studies:  CLINICAL DATA:  53 year old female with a history of Recen arterial thrombus left lower extremity, now with blisters to left foot. Recently started anticoagulation, now with bloody diarrhea.   EXAM: CT ANGIOGRAPHY OF ABDOMINAL AORTA WITH ILIOFEMORAL RUNOFF   TECHNIQUE: Multidetector CT imaging  of the abdomen, pelvis and lower extremities  was performed using the standard protocol during bolus administration of intravenous contrast. Multiplanar CT image reconstructions and MIPs were obtained to evaluate the vascular anatomy.   RADIATION DOSE REDUCTION: This exam was performed according to the departmental dose-optimization program which includes automated exposure control, adjustment of the mA and/or kV according to patient size and/or use of iterative reconstruction technique.   CONTRAST:  OMNIPAQUE IOHEXOL 350 MG/ML SOLN   COMPARISON:  None Available.   FINDINGS: VASCULAR   VASCULAR   Aorta: Unremarkable course, caliber, contour of the abdominal aorta. No dissection, aneurysm, or periaortic fluid. Moderate mixed calcified and atheromatous plaque without pedunculated plaque.   Celiac: Patent, with no significant atherosclerotic changes.   SMA: Patent, with no significant atherosclerotic changes.   Renals:   - Right: Single right renal artery. Mixed calcified and soft plaque at the origin with estimated 50% or less narrowing.   - Left: Single left renal artery. Mixed calcified and soft plaque at the origin without significant narrowing.   IMA: Inferior mesenteric artery is patent.   Right lower extremity:   Mixed atheromatous and calcified plaque at the origin of the common iliac artery, with estimated 50% narrowing. Calcified plaque through the remainder of the CIA without narrowing.   Hypogastric artery is patent with patency of the pelvic arteries.   External iliac artery patent without significant atherosclerotic changes.   Minimal atherosclerosis of the common femoral artery.   Profunda femoris and the thigh branches are patent.   SFA is patent without significant atherosclerotic changes. Popliteal artery patent.   Typical trifurcation with patency of the proximal anterior tibial artery, the tibioperoneal trunk, proximal peroneal artery and proximal posterior tibial artery.   The  distal tibial arteries of the right lower extremity are limited in evaluation given the timing of the contrast bolus. Despite this, at least anterior tibial artery and posterior tibial artery appear patent to the ankle.   Left lower extremity:   Mixed atheromatous and calcified plaque at the origin the left common iliac artery. No high-grade stenosis. Calcifications through the length of the common iliac artery which is patent.   Hypogastric artery patent.  Pelvic arteries patent.   External iliac artery patent without significant atherosclerotic changes.   No significant atherosclerotic changes of the common femoral artery.   Profunda femoris and the thigh branches patent.   Superficial femoral artery is patent throughout its length without significant atherosclerotic changes.   Popliteal artery patent.   Anterior tibial artery origin patent.   Tibioperoneal trunk patent.   The distal tibial arteries of the left lower extremity are limited given the timing of the contrast bolus.   The CTA is nondiagnostic for patency of the tibial arteries in the mid and distal lower leg.   Veins: Unremarkable appearance of the unopacified venous system.   Review of the MIP images confirms the above findings.   NON-VASCULAR   Lower chest: Mix tree-in-bud and centrilobular nodularity in the bilateral lung bases. No comparison. No pleural effusion. No significant bronchial wall thickening or endobronchial debris.   Hepatobiliary: Focal fatty infiltration adjacent to the falciform ligament. Unremarkable gall bladder.   Pancreas: Unremarkable.   Spleen: Unremarkable.   Adrenals/Urinary Tract:   - Right adrenal gland: Unremarkable   - Left adrenal gland: Unremarkable.   - Right kidney: No hydronephrosis, nephrolithiasis, inflammation, or ureteral dilation. No focal lesion.   - Left Kidney: No hydronephrosis, nephrolithiasis, inflammation, or ureteral dilation. No  focal  lesion.   - Urinary Bladder: Unremarkable.   Stomach/Bowel:   - Stomach: Unremarkable.   - Small bowel: Unremarkable   - Appendix: Normal.   - Colon: The colon is fluid-filled from the hepatic flexure through the transverse colon, splenic flexure, descending colon and sigmoid colon. Diverticula present within the sigmoid colon with some associated wall thickening and mild local inflammation of the fascia/mesentery.   Lymphatic: No adenopathy.   Mesenteric: No free fluid or air. No mesenteric adenopathy.   Reproductive: Unremarkable appearance of the pelvic organs.   Other: Surgical changes of the left inguinal region with some fatty stranding and small focal fluid. No evidence arterial pseudoaneurysm. No evidence of hematoma or abscess. Surgical staples present within the superficial soft tissues.   Musculoskeletal: No evidence of acute fracture. No bony canal narrowing. No significant degenerative changes of the hips.   IMPRESSION: CT angiogram demonstrates predominantly aorto iliac disease, including:   -moderate irregular aortic atherosclerosis, with mixed atheromatous and calcified plaque. Aortic Atherosclerosis (ICD10-I70.0).   -bilateral common iliac arterial disease with mixed calcified and atheromatous plaque. There is estimated 50% or less narrowing at the origin of the right CIA, with no significant narrowing of the left CIA.   The CT is nondiagnostic for evaluation of the left-sided tibial arteries, and patency cannot be confirmed below the popliteal artery and trifurcation.   Incidental subsegmental pulmonary embolism of the right lung base.   Bilateral renal arterial disease, without high-grade stenosis.   Fluid-filled colon, can be seen with nonspecific enteritis/colitis and correlation with symptoms may be useful. There is questionable early inflammatory changes in the wall of the sigmoid colon, at the site of the most pronounced diverticular  disease, potentially representing early diverticulitis.   Mixed tree-in-bud and centrilobular nodularity of the lung bases. This may be inflammatory/infectious or alternatively could be related to the patient's known diagnosis of PE/VTE.   Additional ancillary findings as above.   Signed,   Marciano Settles. Rexine Cater, RPVI   [facility]   [facility]     Electronically Signed   By: Myrlene Asper D.O.   On: 11/23/2023 13:20    EXAM: NUCLEAR MEDICINE GASTROINTESTINAL BLEEDING SCAN   TECHNIQUE: Sequential abdominal images were obtained following intravenous administration of Tc-44m labeled red blood cells.   RADIOPHARMACEUTICALS:  23.72 mCi Tc-34m pertechnetate in-vitro labeled red cells.   COMPARISON:  None Available.   FINDINGS: There is expected uptake of radiotracer within the intravascular space, liver, and spleen. Expected gradual accumulation within bladder. No evidence of active gastrointestinal hemorrhage.   IMPRESSION: No active gastrointestinal hemorrhage.     Electronically Signed   By: Worthy Heads M.D.   On: 11/27/2023 21:23   Assessment/Plan:  53 y.o. female with ischemic colitis with bleeding, complicated by pertinent comorbidities including pulmonary embolism, DVT, acute limb ischemia s/p left BKA, smoker, Hypertension.  Patient with ischemic colitis with bleeding.  Slowly trending down hemoglobin.  CTA, bleeding scan and colonoscopy negative for active bleeding.  There was stigmata of recent bleeding from the ischemic colitis.  I recommend to continue supportive management and medical therapy for this complicated patient.  I did discuss with patient that if bleeding is unable to be contained by itself, patient will need a subtotal colectomy with end ileostomy creation.  Currently abdominal exam without tenderness of patient.  No indication for emergent surgery at this moment.  Agreed to to hold  anticoagulation if possible.  This made her treatment  difficult due to her known PE.  Will continue to follow closely.   Lucila Rye, MD

## 2023-11-28 NOTE — Transfer of Care (Signed)
 Immediate Anesthesia Transfer of Care Note  Patient: Penny Chase  Procedure(s) Performed: EGD (ESOPHAGOGASTRODUODENOSCOPY) COLONOSCOPY IMAGING PROCEDURE, GI TRACT, INTRALUMINAL, VIA CAPSULE  Patient Location: Endoscopy Unit  Anesthesia Type:General  Level of Consciousness: awake, alert , and oriented  Airway & Oxygen Therapy: Patient Spontanous Breathing  Post-op Assessment: Report given to RN and Post -op Vital signs reviewed and stable  Post vital signs: Reviewed and stable  Last Vitals:  Vitals Value Taken Time  BP 99/48 11/28/23 1123  Temp 32.2 C 11/28/23 1122  Pulse 88 11/28/23 1124  Resp 14 11/28/23 1124  SpO2 98 % 11/28/23 1124  Vitals shown include unfiled device data.  Last Pain:  Vitals:   11/28/23 1122  TempSrc: Temporal  PainSc:       Patients Stated Pain Goal: 3 (11/27/23 2210)  Complications: No notable events documented.

## 2023-11-28 NOTE — Progress Notes (Signed)
 [facility]  - [place] at [facility]   PATIENT NAME: Penny Chase    MR#:  [id number]  DATE OF BIRTH:  [birth year]  SUBJECTIVE:  patient seen earlier times two. No family at bedside. Spoke with patient's daughter Penny Chase on the phone. Overnight got bowel prep for colonoscopy. Intermittent blood clots. Complains of abdominal pain and phantom limb VITALS:  Blood pressure 111/66, pulse 84, temperature 97.8 F (36.6 C), resp. rate 16, height 5\' 4"  (1.626 m), weight 81.6 kg, last menstrual period 09/20/2018, SpO2 100%.  PHYSICAL EXAMINATION:   GENERAL:  53 y.o.-year-old patient with no acute distress. Chronically  ill LUNGS: Normal breath sounds bilaterally, no wheezing CARDIOVASCULAR: S1, S2 normal. No murmur   ABDOMEN: Soft, nontender, nondistended. Bowel sounds present.  EXTREMITIES: left BKA dressing+ NEUROLOGIC: nonfocal  patient is alert and awake  LABORATORY PANEL:  CBC Recent Labs  Lab 11/28/23 0529 11/28/23 0948  WBC 29.2*  --   HGB 7.9* 7.5*  HCT 23.6* 23.2*  PLT 585*  --     Chemistries  Recent Labs  Lab 11/24/23 0254 11/25/23 0816  NA 133* 137  K 3.8 4.2  CL 102 109  CO2 21* 24  GLUCOSE 124* 118*  BUN 15 15  CREATININE 0.68 0.73  CALCIUM 8.3* 8.4*  MG  --  2.2  AST 28  --   ALT 27  --   ALKPHOS 97  --   BILITOT 0.5  --     Assessment and Plan  Penny Chase is a 53 y.o. female with medical history significant of PE, hypertension, tobacco abuse, substance abuse, history of right breast cancer presenting with left lower extremity ischemia, GI bleeding, diverticulitis.  Patient reports worsening left foot pain redness and swelling over the past 1 to 2 days.  Noted to have been recently evaluated in the [facility] system March 29 through April 7 with issues including multiple subsegmental PEs, lower extremity arterial occlusion status post thrombectomy.   Lower GI bleeding suspected ischemic colitis Noted bloody bowel movements x 12 to 24  hours in the setting of anticoagulation use CT imaging with noted diverticulitis --monitor Hgb -- G.I. consultation with Dr. Doyle Askew -- was on  IV heparin drip given limb ischemia, PE, DVT, arterial thrombus. -- Patient understands the risk and complications of being of blood thinner and G.I. bleed --Stool studies negative for infection --Dr Allegra Lai plans EGD colonoscopy tomorrow  --EGD:- Normal esophagus.                        - Non-bleeding gastric ulcer with no stigmata of                         bleeding.                        - Gastritis with hemorrhage.                        - Non-bleeding duodenal ulcer with no stigmata of                         bleeding.                        - Duodenitis. --Colonoscopy - Mucosal ulceration in the descending colon, at the  splenic flexure and at the hepatic flexure.                        - Blood in the entire examined colon.                        - Diverticulosis in the entire examined colon.                        - Mucosal ulceration in the sigmoid colon. Clip (MR                         conditional) was placed. Clip manufacturer: [employer] --4/14-- given ongoing G.I. bleed discussed risk and benefits of IV heparin drip and now discontinued. Daughter and patient aware.  Acute blood loss anemia                        Anemia of chronic disease -- came in with hemoglobin of 11.4--- 6.5-- 2 Unit PRBC--8.9--9.3--7.5--1 unit PRBC--    Limb ischemia Gangrene of left foot  --Worsening left lower extremity distal foot redness and discoloration concerning for limb ischemia and dry gangrene Noted recent admission in the [facility] system for similar issues with patient refusing amputation. --Procedures at [facility] included iliac thrombus in addition to thrombus in the AT and PT. S/p L CFA/AT/PT cutdown, L AT/PT thromboembolectomy 11/14/23-patient refused amputation --per podiatry, no need for abx --angiogram on 4/10 with  no intervention preformed --s/p left BKA per Dr Prescilla Brod  PAD --per vascular, "plan for distal aortic stenting with coverage of the common iliac arteries in 4 to 6 weeks once the left groin has healed." --cont ASA and statin   PE (pulmonary thromboembolism) (HCC) Left LE DVT/Arterial thrombus underwent procedure at [facility] Noted recent diagnosis in the [facility] system with PE --hold home Eliquis --cont heparin gtt for now since patient is reporting bright red blood per rectum occasionally.  -- HOLD heparin gtt  due to ongoing GI bleed   Leukocytosis --Marked white count of 28 on presentation --Suspect multifactorial with reactive component given multiple recent vascular procedures as well as suspected diverticulitis on CT scan   Substance abuse (HCC) --tobacco and MJ   Tobacco abuse --1 pack/day smoker Discussed cessation at length --cont nicotine patch and gum   HTN (hypertension), benign --BP stable     DVT prophylaxis: SCD Code Status: Full code  Family Communication: none today Consult: vascular, GI Level of care: Med-Surg Dispo:   The patient is from: home Anticipated d/c is to: TBD Anticipated d/c date is: > 3 days       TOTAL TIME TAKING CARE OF THIS PATIENT: .  >50% time spent on counselling and coordination of care  Note: This dictation was prepared with Dragon dictation along with smaller phrase technology. Any transcriptional errors that result from this process are unintentional.  Penny Chase M.D    [facility]   CC: Primary care physician; [facility]

## 2023-11-28 NOTE — Anesthesia Postprocedure Evaluation (Signed)
 Anesthesia Post Note  Patient: Penny Chase  Procedure(s) Performed: EGD (ESOPHAGOGASTRODUODENOSCOPY) COLONOSCOPY IMAGING PROCEDURE, GI TRACT, INTRALUMINAL, VIA CAPSULE  Patient location during evaluation: Endoscopy Anesthesia Type: General Level of consciousness: awake and alert Pain management: pain level controlled Vital Signs Assessment: post-procedure vital signs reviewed and stable Respiratory status: spontaneous breathing, nonlabored ventilation, respiratory function stable and patient connected to nasal cannula oxygen Cardiovascular status: blood pressure returned to baseline and stable Postop Assessment: no apparent nausea or vomiting Anesthetic complications: no  No notable events documented.   Last Vitals:  Vitals:   11/28/23 1027 11/28/23 1122  BP: 119/76 (!) 99/48  Pulse: 96 89  Resp: 20 16  Temp: (!) 36 C (!) 32.2 C  SpO2: 99% 100%    Last Pain:  Vitals:   11/28/23 1122  TempSrc: Temporal  PainSc:                  Enrique Harvest

## 2023-11-28 NOTE — Progress Notes (Signed)
 OT Cancellation Note  Patient Details Name: ARITZA BRUNET MRN: [id number] DOB: 1971/01/13   Cancelled Treatment:    Reason Eval/Treat Not Completed: Other (comment);Fatigue/lethargy limiting ability to participate. Pt unable to keep eyes open requiring VC/TC to briefly maintain alertness to answer brief questions. Pt cites recent pain meds as reason she is very drowsy and reports that staff were going to get her for a colonoscopy at 9am. Will re-attempt OT evaluation at later date/time as pt is available and appropriate.   Shahrukh Pasch R., MPH, MS, OTR/L ascom [phone] 11/28/23, 9:00 AM

## 2023-11-28 NOTE — Anesthesia Procedure Notes (Signed)
 Date/Time: 11/28/2023 10:50 AM  Performed by: Angelia Kelp, CRNAPre-anesthesia Checklist: Patient identified, Emergency Drugs available, Suction available, Patient being monitored and Timeout performed Patient Re-evaluated:Patient Re-evaluated prior to induction Oxygen Delivery Method: Nasal cannula Preoxygenation: Pre-oxygenation with 100% oxygen Induction Type: IV induction

## 2023-11-28 NOTE — Consult Note (Signed)
 PHARMACY - ANTICOAGULATION CONSULT NOTE  Pharmacy Consult for Heparin infusion Indication: PE and low extremity ischemia  Allergies  Allergen Reactions   Amlodipine Swelling   Patient Measurements: Height: 5\' 4"  (162.6 cm) Weight: 81.6 kg (180 lb) IBW/kg (Calculated) : 54.7 HEPARIN DW (KG): 72.4  Vital Signs: Temp: 98.3 F (36.8 C) (04/14 0422) Temp Source: Oral (04/13 2053) BP: 150/89 (04/14 0422) Pulse Rate: 104 (04/14 0422)  Labs: Recent Labs    11/25/23 0816 11/25/23 1427 11/26/23 0526 11/26/23 1014 11/27/23 0535 11/27/23 0955 11/27/23 1606 11/27/23 2145 11/28/23 0529  HGB 9.3*  --  8.3*   < > 8.9*   < > 9.6* 9.0* 7.9*  HCT 27.4*  --  25.0*   < > 26.8*   < > 28.4* 26.9* 23.6*  PLT 838*  --  803*   < > 644*  --  623*  --  585*  APTT 72*   < > 87*  --  84*  --   --   --  90*  HEPARINUNFRC 0.85*  --  0.78*  --  0.87*  --   --   --  0.64  CREATININE 0.73  --   --   --   --   --   --   --   --    < > = values in this interval not displayed.   Estimated Creatinine Clearance: 83.1 mL/min (by C-G formula based on SCr of 0.73 mg/dL).  Medical History: Past Medical History:  Diagnosis Date   Breast cancer (HCC)    Cancer (HCC) 09/2018   Right breast   COVID-19 11/2018   Family history of breast cancer    Hypertension    Personal history of radiation therapy 2020   Rt breast    Medications:  Apixaban 5mg  po BID - last dose 4/9 @ 0730 per med hx  Assessment: 53 yo fe,ale patient with PMH of hypertension, GERD, bipolar disorder, breast cancer, recent admission to [facility] for PE and arterial occlusion of left lower extremity. Present to ED with foot pain and blood in stools. Pharmacy consulted to initiate heparin infusion per vascular provider. Per their note on 4/11, patient will undergo LLE BKA on 4/12.  Per Dr Gilda Crease: give 1/2 the calculated bolus and OK to follow nomogram thereafter.  Noted last apixaban given > 12 hours ago. aPTT 30, INR 1.1, hgb  11.4 Diverticulitis found on CT.  4/10 @ 0254: aPTT = 34, HL = 0.78  SUBtherapeutic, not correlating 4/11 @ 0000: aPTT = 42, HL = 0.54  SUBtherapeutic, not correlating  4/11 @ 0816: aPTT = 72, HL = 0.85  Therapeutic x 1, not correlating 4/11 @ 1427: aPTT = 59   SUBtherapeutic 4/12 @ 0526: aPTT = 87,   HL = 0.78 4/13 @ 0535: aPTT = 84,   HL = 0.87           Therapeutic X 3 4/14 @ 0529: aPTT = 90,   HL = 0.64           Therapeutic X 4   Goal of Therapy:  Heparin level 0.3-0.7 units/ml aPTT 66-102 seconds Monitor platelets by anticoagulation protocol: Yes   Plan:  4/14 @ 0529: aPTT = 90,  HL = 0.64 - aPTT therapeutic X 4 , now correlating with HL  - will use HL to guide dosing from here on - will continue pt on current rate and recheck HL on 4/15 with AM labs - Continue to monitor  CBC daily while on heparin infusion.  Besse Miron D Clinical Pharmacist 11/28/2023 6:14 AM  .Sherril Dole

## 2023-11-28 NOTE — Plan of Care (Signed)
   Problem: Education: Goal: Knowledge of General Education information will improve Description Including pain rating scale, medication(s)/side effects and non-pharmacologic comfort measures Outcome: Progressing

## 2023-11-28 NOTE — H&P (View-Only) (Signed)
 Hospital Consult    Reason for Consult:  Active GI Bleeding/Colon Ischemia. IVC Filter Placement Needed Requesting Physician:  Dr Midge Minium MD  MRN #:  [id number]  History of Present Illness: This is a 53 y.o. female who is now postop day 2 from left lower extremity below the knee amputation due to critical limb ischemia.  Patient underwent prior surgery at [facility] consisting of a left femoral endarterectomy with mechanical thrombectomy.  Postoperative she developed extreme lower extremity ischemia to her left foot.  Patient was taken to the vascular lab by vascular surgery on 11/24/2023 for left lower extremity angiogram.  Patient did not have any circulatory blood flow to her left foot.  It was deemed at that time that she undergo a left below the knee amputation.  Patient was noted to have bloody bowel movements upon admission through the emergency department.  Patient underwent colonoscopy this morning which showed colon ischemia with active bleeding.  Patient's heparin infusion has been stopped.  Vascular surgery was consulted immediately for inferior vena cava filter placement now that she is unable to be on any anticoagulation.  Past Medical History:  Diagnosis Date   Breast cancer (HCC)    Cancer (HCC) 09/2018   Right breast   COVID-19 11/2018   Family history of breast cancer    Hypertension    Personal history of radiation therapy 2020   Rt breast    Past Surgical History:  Procedure Laterality Date   AMPUTATION Left 11/26/2023   Procedure: AMPUTATION BELOW KNEE;  Surgeon: Renford Dills, MD;  Location: [facility] ORS;  Service: Vascular;  Laterality: Left;   BREAST BIOPSY Right 09/12/2018   [place] core/"venus clip"-INVASIVE MAMMARYLobular CARCINOMA, WITH LOBULAR FEATURES   BREAST LUMPECTOMY Right 10/04/2018   BREAST LUMPECTOMY WITH NEEDLE LOCALIZATION AND AXILLARY SENTINEL LYMPH NODE BX Right 10/04/2018   Procedure: BREAST LUMPECTOMY WITH NEEDLE LOCALIZATION AND AXILLARY SENTINEL LYMPH  NODE BX;  Surgeon: Carolan Shiver, MD;  Location: [facility] ORS;  Service: General;  Laterality: Right;   LOWER EXTREMITY ANGIOGRAPHY Left 11/24/2023   Procedure: Lower Extremity Angiography;  Surgeon: Renford Dills, MD;  Location: [facility] INVASIVE CV LAB;  Service: Cardiovascular;  Laterality: Left;   TONSILLECTOMY      Allergies  Allergen Reactions   Amlodipine Swelling    Prior to Admission medications   Medication Sig Start Date End Date Taking? Authorizing Provider  apixaban (ELIQUIS) 5 MG TABS tablet Take 5 mg by mouth 2 (two) times daily. 11/21/23 11/20/24 Yes [provider]  aspirin 81 MG chewable tablet Chew 81 mg by mouth daily. 11/22/23 12/22/23 Yes [provider]  citalopram (CELEXA) 20 MG tablet Take 20 mg by mouth daily.   Yes [provider]  gabapentin (NEURONTIN) 600 MG tablet Take 600 mg by mouth 3 (three) times daily.   Yes [provider]  hydrochlorothiazide (HYDRODIURIL) 12.5 MG tablet Take 12.5 mg by mouth daily.   Yes [provider]  pravastatin (PRAVACHOL) 10 MG tablet Take 1 tablet by mouth daily.   Yes [provider]  acetaminophen (TYLENOL) 500 MG tablet Take 500 mg by mouth every 6 (six) hours as needed for moderate pain. Patient not taking: Reported on 07/20/2022    [provider]  celecoxib (CELEBREX) 100 MG capsule Take 100 mg by mouth 2 (two) times daily. Patient not taking: Reported on 11/23/2023 11/13/23   [provider]  Docusate Sodium (DSS) 100 MG CAPS Take 100 mg by mouth 2 (two) times daily.  Patient not taking: Reported on 11/23/2023 11/21/23 12/01/23  [provider]  polyethylene glycol (MIRALAX / GLYCOLAX) 17 g packet Take 17 g by mouth 3 (three) times daily with meals. Patient not taking: Reported on 11/23/2023 11/21/23 12/01/23  [provider]  senna (SENOKOT) 8.6 MG tablet Take 1 tablet by mouth at bedtime. Patient not taking: Reported on 11/23/2023 11/21/23 11/28/23   [provider]  tiZANidine (ZANAFLEX) 2 MG tablet Take 2 mg by mouth 2 (two) times daily. Patient not taking: Reported on 11/23/2023 09/27/23   [provider]    Social History   Socioeconomic History   Marital status: Married    Spouse name: Not on file   Number of children: Not on file   Years of education: Not on file   Highest education level: Not on file  Occupational History   Not on file  Tobacco Use   Smoking status: Every Day    Current packs/day: 1.00    Average packs/day: 1 pack/day for 15.0 years (15.0 ttl pk-yrs)    Types: Cigarettes   Smokeless tobacco: Never   Tobacco comments:    patient declined  Vaping Use   Vaping status: Former  Substance and Sexual Activity   Alcohol use: Not Currently    Comment: not taken for about 6 weeks   Drug use: Not Currently    Types: Marijuana    Comment: Uses street opioids   Sexual activity: Not Currently  Other Topics Concern   Not on file  Social History Narrative   Works at [employer];  Smoke 15 cig/day; No alcohol abuse; in [employer]; with husband.    Social Drivers of Corporate investment banker Strain: Low Risk  (11/15/2023)   Received from [facility]   Overall Financial Resource Strain (CARDIA)    Difficulty of Paying Living Expenses: Not very hard  Food Insecurity: Patient Unable To Answer (11/25/2023)   Hunger Vital Sign    Worried About Running Out of Food in the Last Year: Patient unable to answer    Ran Out of Food in the Last Year: Patient unable to answer  Transportation Needs: Patient Unable To Answer (11/25/2023)   PRAPARE - Transportation    Lack of Transportation (Medical): Patient unable to answer    Lack of Transportation (Non-Medical): Patient unable to answer  Physical Activity: Not on file  Stress: Not on file  Social Connections: Not on file  Intimate Partner Violence: Patient Unable To Answer (11/25/2023)   Humiliation, Afraid, Rape, and Kick  questionnaire    Fear of Current or Ex-Partner: Patient unable to answer    Emotionally Abused: Patient unable to answer    Physically Abused: Patient unable to answer    Sexually Abused: Patient unable to answer     Family History  Problem Relation Age of Onset   Breast cancer Paternal Aunt     ROS: Otherwise negative unless mentioned in HPI  Physical Examination  Vitals:   11/28/23 1142 11/28/23 1232  BP: 109/68 111/66  Pulse:  84  Resp:  16  Temp:  97.8 F (36.6 C)  SpO2:  100%   Body mass index is 30.9 kg/m.  General:  WDWN in NAD Gait: Not observed HENT: WNL, normocephalic Pulmonary: normal non-labored breathing, without Rales, rhonchi,  wheezing Cardiac: regular, without  Murmurs, rubs or gallops; without carotid bruits Abdomen: Hyperactive bowel sounds post colonoscopy, soft, NT/ND, no masses Skin: without rashes Vascular Exam/Pulses: Palpable pulses throughout except  Left Lower extremity BKA  Extremities: without ischemic changes, without Gangrene , without cellulitis; with wounds New Left BKA Dressing clean dry and intact.  Musculoskeletal: no muscle wasting or atrophy  Neurologic: A&O X1 Patient post anesthesia from colonoscopy. Psychiatric:  The pt has Normal affect. Post Anesthesia Lymph:  Unremarkable  CBC    Component Value Date/Time   WBC 29.2 (H) 11/28/2023 0529   RBC 2.54 (L) 11/28/2023 0529   HGB 7.5 (L) 11/28/2023 0948   HCT 23.2 (L) 11/28/2023 0948   PLT 585 (H) 11/28/2023 0529   MCV 92.9 11/28/2023 0529   MCH 31.1 11/28/2023 0529   MCHC 33.5 11/28/2023 0529   RDW 16.5 (H) 11/28/2023 0529   LYMPHSABS 2.0 11/24/2023 1047   MONOABS 1.5 (H) 11/24/2023 1047   EOSABS 0.6 (H) 11/24/2023 1047   BASOSABS 0.3 (H) 11/24/2023 1047    BMET    Component Value Date/Time   NA 137 11/25/2023 0816   K 4.2 11/25/2023 0816   CL 109 11/25/2023 0816   CO2 24 11/25/2023 0816   GLUCOSE 118 (H) 11/25/2023 0816   BUN 15 11/25/2023 0816   CREATININE  0.73 11/25/2023 0816   CALCIUM 8.4 (L) 11/25/2023 0816   GFRNONAA >60 11/25/2023 0816   GFRAA >60 05/31/2016 1152    COAGS: Lab Results  Component Value Date   INR 1.1 11/23/2023     Non-Invasive Vascular Imaging:   Colonoscopy 11/28/2023  Statin:  No. Beta Blocker:  No. Aspirin:  No. ACEI:  No. ARB:  No. CCB use:  No Other antiplatelets/anticoagulants:  Yes.   Eliquis 5 mg BID and Celebrex 50 mg BID     ASSESSMENT/PLAN: This is a 53 y.o. female who is now postop day 2 from left lower extremity below the knee amputation to critical limb ischemia.  Patient was having bloody stools and underwent a colonoscopy this morning on 11/28/2023.  She was found to have a segment of ischemic bowel and another segment that was bleeding.  Surgery was consulted for IVC placement as the patient is unable to be anticoagulated at this time.  Due to the patient's colonoscopy this morning and anesthesia I called the patient's daughter Katrinka Blazing via telephone for consent.  Dr. Burna Cash, MD hospitalist here had spoken to [facility] earlier about the results of the colonoscopy and the need for IVC filter.  I spoke to Lincoln over the phone and we discussed in detail the procedure, benefits, risk, and complications.  She verbalizes her understanding and wishes to proceed today with an IVC filter placement.  I answered all her questions.  Patient has been n.p.o. since midnight last night for the colonoscopy and will continue to be so for the IVC filter placement.  I took consent over the phone from her daughter Katrinka Blazing.  It was witnessed by the floor nurse taking care of the patient today.   -I discussed the case in detail with Dr. Wilmer Floor, MD and he agrees with the plan.   Marcie Bal [facility] 11/28/2023 1:49 PM

## 2023-11-28 NOTE — Interval H&P Note (Signed)
 History and Physical Interval Note:  11/28/2023 4:48 PM  Penny Chase  has presented today for surgery, with the diagnosis of GI Bleeding.  The various methods of treatment have been discussed with the patient and family. After consideration of risks, benefits and other options for treatment, the patient has consented to  Procedure(s): IVC FILTER INSERTION (N/A) as a surgical intervention.  The patient's history has been reviewed, patient examined, no change in status, stable for surgery.  I have reviewed the patient's chart and labs.  Questions were answered to the patient's satisfaction.     [name]

## 2023-11-28 NOTE — Op Note (Addendum)
 [facility] Gastroenterology Patient Name: Penny Chase Procedure Date: 11/28/2023 10:37 AM MRN: [id number] Account #: [id number] Date of Birth: 04-Apr-1971 Admit Type: Inpatient Age: 53 Room: [facility] ENDO ROOM 4 Gender: Female Note Status: Finalized Instrument Name: Prentice Docker [id number] Procedure:             Colonoscopy Indications:           Hematochezia Providers:             Midge Minium MD, MD Referring MD:          Clinic [facility] Tenaya, MD (Referring MD) Medicines:             Propofol per Anesthesia Complications:         No immediate complications. Procedure:             Pre-Anesthesia Assessment:                        - Prior to the procedure, a History and Physical was                         performed, and patient medications and allergies were                         reviewed. The patient's tolerance of previous                         anesthesia was also reviewed. The risks and benefits                         of the procedure and the sedation options and risks                         were discussed with the patient. All questions were                         answered, and informed consent was obtained. Prior                         Anticoagulants: The patient has taken no anticoagulant                         or antiplatelet agents. ASA Grade Assessment: III - A                         patient with severe systemic disease. After reviewing                         the risks and benefits, the patient was deemed in                         satisfactory condition to undergo the procedure.                        After obtaining informed consent, the colonoscope was                         passed under direct vision. Throughout the procedure,  the patient's blood pressure, pulse, and oxygen                         saturations were monitored continuously. The                         Colonoscope was introduced through the  anus and                         advanced to the the cecum, identified by appendiceal                         orifice and ileocecal valve. The colonoscopy was                         performed without difficulty. The patient tolerated                         the procedure well. The quality of the bowel                         preparation was fair. Findings:      The perianal and digital rectal examinations were normal.      Discontinuous areas of bleeding ulcerated mucosa with stigmata of recent       bleeding were present in the descending colon, at the splenic flexure       and at the hepatic flexure.      Red blood was found in the entire colon.      Multiple small-mouthed diverticula were found in the entire colon.      Bleeding ulcerated mucosa with stigmata of recent bleeding were present       in the sigmoid colon. For hemostasis, one hemostatic clip was       successfully placed (MR conditional). Clip manufacturer: [employer]. There was no bleeding at the end of the procedure. Impression:            - Preparation of the colon was fair.                        - Mucosal ulceration in the descending colon, at the                         splenic flexure and at the hepatic flexure.                        - Blood in the entire examined colon.                        - Diverticulosis in the entire examined colon.                        - Mucosal ulceration in the sigmoid colon. Clip (MR                         conditional) was placed. Clip manufacturer: [employer].                        -  No specimens collected. Recommendation:        - Return patient to hospital ward for ongoing care.                        - Resume previous diet.                        - Continue present medications. Procedure Code(s):     --- Professional ---                        [phone], Colonoscopy, flexible; with control of                         bleeding, any  method Diagnosis Code(s):     --- Professional ---                        K92.1, Melena (includes Hematochezia)                        K92.2, Gastrointestinal hemorrhage, unspecified                        K63.3, Ulcer of intestine CPT copyright 2022 American Medical Association. All rights reserved. The codes documented in this report are preliminary and upon coder review may  be revised to meet current compliance requirements. Marnee Sink MD, MD 11/28/2023 11:20:25 AM This report has been signed electronically. Number of Addenda: 0 Note Initiated On: 11/28/2023 10:37 AM Scope Withdrawal Time: 0 hours 5 minutes 26 seconds  Total Procedure Duration: 0 hours 13 minutes 16 seconds  Estimated Blood Loss:  Estimated blood loss: none.      [facility]

## 2023-11-28 NOTE — TOC Progression Note (Signed)
 Transition of Care [facility]) - Progression Note    Patient Details  Name: Penny Chase MRN: [id number] Date of Birth: [birth year]  Transition of Care [facility]) CM/SW Contact  Alexandra Ice, RN Phone Number: 11/28/2023, 8:49 AM  Clinical Narrative:    Patient undergoing colonoscopy today, TOC will continue to monitor for needs.         Expected Discharge Plan and Services                                               Social Determinants of Health (SDOH) Interventions SDOH Screenings   Food Insecurity: Patient Unable To Answer (11/25/2023)  Housing: Patient Unable To Answer (11/25/2023)  Transportation Needs: Patient Unable To Answer (11/25/2023)  Utilities: Patient Unable To Answer (11/25/2023)  Alcohol Screen: Medium Risk (01/06/2021)  Financial Resource Strain: Low Risk  (11/15/2023)   Received from [facility]  Tobacco Use: High Risk (11/25/2023)    Readmission Risk Interventions     No data to display

## 2023-11-28 NOTE — Consult Note (Signed)
 Hospital Consult    Reason for Consult:  Active GI Bleeding/Colon Ischemia. IVC Filter Placement Needed Requesting Physician:  Dr Midge Minium MD  MRN #:  [id number]  History of Present Illness: This is a 53 y.o. female who is now postop day 2 from left lower extremity below the knee amputation due to critical limb ischemia.  Patient underwent prior surgery at [facility] consisting of a left femoral endarterectomy with mechanical thrombectomy.  Postoperative she developed extreme lower extremity ischemia to her left foot.  Patient was taken to the vascular lab by vascular surgery on 11/24/2023 for left lower extremity angiogram.  Patient did not have any circulatory blood flow to her left foot.  It was deemed at that time that she undergo a left below the knee amputation.  Patient was noted to have bloody bowel movements upon admission through the emergency department.  Patient underwent colonoscopy this morning which showed colon ischemia with active bleeding.  Patient's heparin infusion has been stopped.  Vascular surgery was consulted immediately for inferior vena cava filter placement now that she is unable to be on any anticoagulation.  Past Medical History:  Diagnosis Date   Breast cancer (HCC)    Cancer (HCC) 09/2018   Right breast   COVID-19 11/2018   Family history of breast cancer    Hypertension    Personal history of radiation therapy 2020   Rt breast    Past Surgical History:  Procedure Laterality Date   AMPUTATION Left 11/26/2023   Procedure: AMPUTATION BELOW KNEE;  Surgeon: Renford Dills, MD;  Location: [facility] ORS;  Service: Vascular;  Laterality: Left;   BREAST BIOPSY Right 09/12/2018   [place] core/"venus clip"-INVASIVE MAMMARYLobular CARCINOMA, WITH LOBULAR FEATURES   BREAST LUMPECTOMY Right 10/04/2018   BREAST LUMPECTOMY WITH NEEDLE LOCALIZATION AND AXILLARY SENTINEL LYMPH NODE BX Right 10/04/2018   Procedure: BREAST LUMPECTOMY WITH NEEDLE LOCALIZATION AND AXILLARY SENTINEL LYMPH  NODE BX;  Surgeon: Carolan Shiver, MD;  Location: [facility] ORS;  Service: General;  Laterality: Right;   LOWER EXTREMITY ANGIOGRAPHY Left 11/24/2023   Procedure: Lower Extremity Angiography;  Surgeon: Renford Dills, MD;  Location: [facility] INVASIVE CV LAB;  Service: Cardiovascular;  Laterality: Left;   TONSILLECTOMY      Allergies  Allergen Reactions   Amlodipine Swelling    Prior to Admission medications   Medication Sig Start Date End Date Taking? Authorizing Provider  apixaban (ELIQUIS) 5 MG TABS tablet Take 5 mg by mouth 2 (two) times daily. 11/21/23 11/20/24 Yes [provider]  aspirin 81 MG chewable tablet Chew 81 mg by mouth daily. 11/22/23 12/22/23 Yes [provider]  citalopram (CELEXA) 20 MG tablet Take 20 mg by mouth daily.   Yes [provider]  gabapentin (NEURONTIN) 600 MG tablet Take 600 mg by mouth 3 (three) times daily.   Yes [provider]  hydrochlorothiazide (HYDRODIURIL) 12.5 MG tablet Take 12.5 mg by mouth daily.   Yes [provider]  pravastatin (PRAVACHOL) 10 MG tablet Take 1 tablet by mouth daily.   Yes [provider]  acetaminophen (TYLENOL) 500 MG tablet Take 500 mg by mouth every 6 (six) hours as needed for moderate pain. Patient not taking: Reported on 07/20/2022    [provider]  celecoxib (CELEBREX) 100 MG capsule Take 100 mg by mouth 2 (two) times daily. Patient not taking: Reported on 11/23/2023 11/13/23   [provider]  Docusate Sodium (DSS) 100 MG CAPS Take 100 mg by mouth 2 (two) times daily.  Patient not taking: Reported on 11/23/2023 11/21/23 12/01/23  [provider]  polyethylene glycol (MIRALAX / GLYCOLAX) 17 g packet Take 17 g by mouth 3 (three) times daily with meals. Patient not taking: Reported on 11/23/2023 11/21/23 12/01/23  [provider]  senna (SENOKOT) 8.6 MG tablet Take 1 tablet by mouth at bedtime. Patient not taking: Reported on 11/23/2023 11/21/23 11/28/23   [provider]  tiZANidine (ZANAFLEX) 2 MG tablet Take 2 mg by mouth 2 (two) times daily. Patient not taking: Reported on 11/23/2023 09/27/23   [provider]    Social History   Socioeconomic History   Marital status: Married    Spouse name: Not on file   Number of children: Not on file   Years of education: Not on file   Highest education level: Not on file  Occupational History   Not on file  Tobacco Use   Smoking status: Every Day    Current packs/day: 1.00    Average packs/day: 1 pack/day for 15.0 years (15.0 ttl pk-yrs)    Types: Cigarettes   Smokeless tobacco: Never   Tobacco comments:    patient declined  Vaping Use   Vaping status: Former  Substance and Sexual Activity   Alcohol use: Not Currently    Comment: not taken for about 6 weeks   Drug use: Not Currently    Types: Marijuana    Comment: Uses street opioids   Sexual activity: Not Currently  Other Topics Concern   Not on file  Social History Narrative   Works at [employer];  Smoke 15 cig/day; No alcohol abuse; in [employer]; with husband.    Social Drivers of Corporate investment banker Strain: Low Risk  (11/15/2023)   [facility]   Overall Financial Resource Strain (CARDIA)    Difficulty of Paying Living Expenses: Not very hard  Food Insecurity: Patient Unable To Answer (11/25/2023)   Hunger Vital Sign    Worried About Running Out of Food in the Last Year: Patient unable to answer    Ran Out of Food in the Last Year: Patient unable to answer  Transportation Needs: Patient Unable To Answer (11/25/2023)   PRAPARE - Transportation    Lack of Transportation (Medical): Patient unable to answer    Lack of Transportation (Non-Medical): Patient unable to answer  Physical Activity: Not on file  Stress: Not on file  Social Connections: Not on file  Intimate Partner Violence: Patient Unable To Answer (11/25/2023)   Humiliation, Afraid, Rape, and Kick  questionnaire    Fear of Current or Ex-Partner: Patient unable to answer    Emotionally Abused: Patient unable to answer    Physically Abused: Patient unable to answer    Sexually Abused: Patient unable to answer     Family History  Problem Relation Age of Onset   Breast cancer Paternal Aunt     ROS: Otherwise negative unless mentioned in HPI  Physical Examination  Vitals:   11/28/23 1142 11/28/23 1232  BP: 109/68 111/66  Pulse:  84  Resp:  16  Temp:  97.8 F (36.6 C)  SpO2:  100%   Body mass index is 30.9 kg/m.  General:  WDWN in NAD Gait: Not observed HENT: WNL, normocephalic Pulmonary: normal non-labored breathing, without Rales, rhonchi,  wheezing Cardiac: regular, without  Murmurs, rubs or gallops; without carotid bruits Abdomen: Hyperactive bowel sounds post colonoscopy, soft, NT/ND, no masses Skin: without rashes Vascular Exam/Pulses: Palpable pulses throughout except  Left Lower extremity BKA  Extremities: without ischemic changes, without Gangrene , without cellulitis; with wounds New Left BKA Dressing clean dry and intact.  Musculoskeletal: no muscle wasting or atrophy  Neurologic: A&O X1 Patient post anesthesia from colonoscopy. Psychiatric:  The pt has Normal affect. Post Anesthesia Lymph:  Unremarkable  CBC    Component Value Date/Time   WBC 29.2 (H) 11/28/2023 0529   RBC 2.54 (L) 11/28/2023 0529   HGB 7.5 (L) 11/28/2023 0948   HCT 23.2 (L) 11/28/2023 0948   PLT 585 (H) 11/28/2023 0529   MCV 92.9 11/28/2023 0529   MCH 31.1 11/28/2023 0529   MCHC 33.5 11/28/2023 0529   RDW 16.5 (H) 11/28/2023 0529   LYMPHSABS 2.0 11/24/2023 1047   MONOABS 1.5 (H) 11/24/2023 1047   EOSABS 0.6 (H) 11/24/2023 1047   BASOSABS 0.3 (H) 11/24/2023 1047    BMET    Component Value Date/Time   NA 137 11/25/2023 0816   K 4.2 11/25/2023 0816   CL 109 11/25/2023 0816   CO2 24 11/25/2023 0816   GLUCOSE 118 (H) 11/25/2023 0816   BUN 15 11/25/2023 0816   CREATININE  0.73 11/25/2023 0816   CALCIUM 8.4 (L) 11/25/2023 0816   GFRNONAA >60 11/25/2023 0816   GFRAA >60 05/31/2016 1152    COAGS: Lab Results  Component Value Date   INR 1.1 11/23/2023     Non-Invasive Vascular Imaging:   Colonoscopy 11/28/2023  Statin:  No. Beta Blocker:  No. Aspirin:  No. ACEI:  No. ARB:  No. CCB use:  No Other antiplatelets/anticoagulants:  Yes.   Eliquis 5 mg BID and Celebrex 50 mg BID     ASSESSMENT/PLAN: This is a 53 y.o. female who is now postop day 2 from left lower extremity below the knee amputation to critical limb ischemia.  Patient was having bloody stools and underwent a colonoscopy this morning on 11/28/2023.  She was found to have a segment of ischemic bowel and another segment that was bleeding.  Surgery was consulted for IVC placement as the patient is unable to be anticoagulated at this time.  Due to the patient's colonoscopy this morning and anesthesia I called the patient's daughter Reine Caraway via telephone for consent.  Dr. Delfina Feller, MD hospitalist here had spoken to [facility] earlier about the results of the colonoscopy and the need for IVC filter.  I spoke to [place] over the phone and we discussed in detail the procedure, benefits, risk, and complications.  She verbalizes her understanding and wishes to proceed today with an IVC filter placement.  I answered all her questions.  Patient has been n.p.o. since midnight last night for the colonoscopy and will continue to be so for the IVC filter placement.  I took consent over the phone from her daughter Reine Caraway.  It was witnessed by the floor nurse taking care of the patient today.   -I discussed the case in detail with Dr. Lennon Race, MD and he agrees with the plan.   Annamaria [facility] 11/28/2023 1:49 PM

## 2023-11-28 NOTE — Plan of Care (Signed)

## 2023-11-28 NOTE — Progress Notes (Signed)
 IVC filter card & information pamphlet handed to pt. Spouse now. Info. Reviewed with pt. Spouse now with verbalized need to keep in pt. Wallet at all times.

## 2023-11-28 NOTE — Op Note (Signed)
 [facility] Patient Name: Penny Chase Procedure Date: 11/28/2023 10:37 AM MRN: [id number] Account #: [id number] Date of Birth: Feb 28, 1971 Admit Type: Inpatient Age: 53 Room: [facility] ENDO ROOM 4 Gender: Female Note Status: Finalized Instrument Name: Upper Endoscope [id number] Procedure:             Upper GI endoscopy Indications:           Acute post hemorrhagic anemia Providers:             Midge Minium MD, MD Referring MD:          Clinic [facility], MD (Referring MD) Medicines:             Propofol per Anesthesia Complications:         No immediate complications. Procedure:             Pre-Anesthesia Assessment:                        - Prior to the procedure, a History and Physical was                         performed, and patient medications and allergies were                         reviewed. The patient's tolerance of previous                         anesthesia was also reviewed. The risks and benefits                         of the procedure and the sedation options and risks                         were discussed with the patient. All questions were                         answered, and informed consent was obtained. Prior                         Anticoagulants: The patient has taken no anticoagulant                         or antiplatelet agents. ASA Grade Assessment: II - A                         patient with mild systemic disease. After reviewing                         the risks and benefits, the patient was deemed in                         satisfactory condition to undergo the procedure.                        After obtaining informed consent, the endoscope was                         passed under direct vision. Throughout the procedure,  the patient's blood pressure, pulse, and oxygen                         saturations were monitored continuously. The Endoscope                         was  introduced through the mouth, and advanced to the                         second part of duodenum. The upper GI endoscopy was                         accomplished without difficulty. The patient tolerated                         the procedure well. Findings:      The examined esophagus was normal.      One non-bleeding cratered gastric ulcer with no stigmata of bleeding was       found in the gastric antrum.      Scattered moderate inflammation with hemorrhage characterized by       erosions was found in the entire examined stomach.      One non-bleeding cratered duodenal ulcer with no stigmata of bleeding       was found in the duodenal bulb.      Scattered moderate inflammation characterized by erosions was found in       the entire duodenum. Impression:            - Normal esophagus.                        - Non-bleeding gastric ulcer with no stigmata of                         bleeding.                        - Gastritis with hemorrhage.                        - Non-bleeding duodenal ulcer with no stigmata of                         bleeding.                        - Duodenitis.                        - No specimens collected. Recommendation:        - Return patient to hospital ward for ongoing care.                        - Resume previous diet.                        - Continue present medications.                        - Perform a colonoscopy today. Procedure Code(s):     --- Professional ---  16109, Esophagogastroduodenoscopy, flexible,                         transoral; diagnostic, including collection of                         specimen(s) by brushing or washing, when performed                         (separate procedure) Diagnosis Code(s):     --- Professional ---                        D62, Acute posthemorrhagic anemia                        K26.9, Duodenal ulcer, unspecified as acute or                         chronic, without hemorrhage or  perforation                        K25.9, Gastric ulcer, unspecified as acute or chronic,                         without hemorrhage or perforation CPT copyright 2022 American Medical Association. All rights reserved. The codes documented in this report are preliminary and upon coder review may  be revised to meet current compliance requirements. Marnee Sink MD, MD 11/28/2023 11:01:37 AM This report has been signed electronically. Number of Addenda: 0 Note Initiated On: 11/28/2023 10:37 AM Estimated Blood Loss:  Estimated blood loss: none.      [facility]

## 2023-11-28 NOTE — Op Note (Signed)
 [place]   OPERATIVE NOTE    PRE-OPERATIVE DIAGNOSIS: PE in association with lower GI bleed  POST-OPERATIVE DIAGNOSIS: Same  PROCEDURE: 1.   Ultrasound guidance for vascular access to the right common femoral vein 2.   Catheter placement into the inferior vena cava 3.   Inferior venacavogram 4.   Placement of a Denali IVC filter infrarenal IVC  SURGEON: [name]  ASSISTANT(S): None  ANESTHESIA: Conscious sedation was administered by the interventional radiology RN under my direct supervision. IV Versed plus fentanyl were utilized. Continuous ECG, pulse oximetry and blood pressure was monitored throughout the entire procedure. Conscious sedation was for a total of 10 minutes.  ESTIMATED BLOOD LOSS: minimal  FINDING(S): 1.  Patent IVC  SPECIMEN(S):  none  INDICATIONS:   Penny Chase is a 53 y.o. y.o. female who presents with multiple medical problems including ischemic colitis with significant bleeding in association with a pulmonary embolism as well as arterial embolization secondary to an aortic ulcer.  Heparinization was indicated for the arterial embolization as well as the pulmonary embolization however in light of her significant GI bleeding requiring transfusion heparin must be stopped.  Inferior vena cava filter is indicated for this reason.  Risks and benefits including filter thrombosis, migration, fracture, bleeding, and infection were all discussed.  We discussed that all IVC filters that we place can be removed if desired from the patient once the need for the filter has passed.    DESCRIPTION: After obtaining full informed written consent, the patient was brought back to the vascular suite. The skin was sterilely prepped and draped in a sterile surgical field was created. Ultrasound was placed in a sterile sleeve. The right common femoral vein was echolucent and compressible indicating patency. Image was recorded for the permanent  record. The puncture was made under continuous real-time ultrasound guidance.  The right common femoral vein was accessed under direct ultrasound guidance without difficulty with a micropuncture needle. Microwire was then advanced under fluoroscopic guidance without difficulty. Micro-sheath was then inserted and a J-wire was then placed. The dilator is passed over the wire and the delivery sheath was placed into the inferior vena cava.  Inferior venacavogram was performed. This demonstrated a patent IVC with the level of the renal veins at L2.  The filter was then deployed into the inferior vena cava at the level of L3 just below the renal veins. The delivery sheath was then removed. Pressure was held. Sterile dressings were placed. The patient tolerated the procedure well and was taken to the recovery room in stable condition.  Interpretation: Inferior vena cava is widely patent.  It appears to be approximately 18 mm in diameter.  Renal blushes are easily identified and the filter is deployed in the upright position at the L3 level.  COMPLICATIONS: None  CONDITION: Stable  [name]  11/28/2023, 5:25 PM

## 2023-11-28 NOTE — Anesthesia Preprocedure Evaluation (Addendum)
 Anesthesia Evaluation  Patient identified by MRN, date of birth, ID band Patient awake    Reviewed: Allergy & Precautions, H&P , NPO status , Patient's Chart, lab work & pertinent test results  Airway Mallampati: II  TM Distance: >3 FB Neck ROM: full    Dental no notable dental hx.    Pulmonary Current Smoker and Patient abstained from smoking. recently evaluated in the [facility] system March 29 through April 7 with issues including multiple subsegmental PE   Pulmonary exam normal        Cardiovascular hypertension, + Peripheral Vascular Disease   Rhythm:Regular Rate:Tachycardia     Neuro/Psych  PSYCHIATRIC DISORDERS   Bipolar Disorder   negative neurological ROS     GI/Hepatic negative GI ROS, Neg liver ROS,,,  Endo/Other  negative endocrine ROS    Renal/GU      Musculoskeletal   Abdominal   Peds  Hematology  (+) Blood dyscrasia, anemia   Anesthesia Other Findings Hx of multiple PEs at [facility]. Possible ischemic bowel per vascular surgery.   Past Medical History: No date: Breast cancer (HCC) 09/2018: Cancer [facility])     Comment:  Right breast 11/2018: COVID-19 No date: Family history of breast cancer No date: Hypertension 2020: Personal history of radiation therapy     Comment:  Rt breast  Past Surgical History: 09/12/2018: BREAST BIOPSY; Right     Comment:  [place] core/"venus clip"-INVASIVE MAMMARYLobular CARCINOMA,               WITH LOBULAR FEATURES 10/04/2018: BREAST LUMPECTOMY; Right 10/04/2018: BREAST LUMPECTOMY WITH NEEDLE LOCALIZATION AND AXILLARY  SENTINEL LYMPH NODE BX; Right     Comment:  Procedure: BREAST LUMPECTOMY WITH NEEDLE LOCALIZATION               AND AXILLARY SENTINEL LYMPH NODE BX;  Surgeon:               Carolan Shiver, MD;  Location: [facility] ORS;  Service:              General;  Laterality: Right; 11/24/2023: LOWER EXTREMITY ANGIOGRAPHY; Left     Comment:  Procedure: Lower Extremity  Angiography;  Surgeon:               Renford Dills, MD;  Location: [facility] INVASIVE CV LAB;               Service: Cardiovascular;  Laterality: Left; No date: TONSILLECTOMY  BMI    Body Mass Index: 30.90 kg/m      Reproductive/Obstetrics negative OB ROS                             Anesthesia Physical Anesthesia Plan  ASA: 4  Anesthesia Plan: General   Post-op Pain Management: Minimal or no pain anticipated   Induction: Intravenous  PONV Risk Score and Plan: 3 and Propofol infusion, TIVA and Ondansetron  Airway Management Planned: Nasal Cannula  Additional Equipment: None  Intra-op Plan:   Post-operative Plan:   Informed Consent: I have reviewed the patients History and Physical, chart, labs and discussed the procedure including the risks, benefits and alternatives for the proposed anesthesia with the patient or authorized representative who has indicated his/her understanding and acceptance.     Dental advisory given  Plan Discussed with: CRNA and Surgeon  Anesthesia Plan Comments: (Discussed risks of anesthesia with patient, including possibility of difficulty with spontaneous ventilation under anesthesia necessitating airway intervention, PONV, and rare risks  such as cardiac or respiratory or neurological events, and allergic reactions. Discussed the role of CRNA in patient's perioperative care. Patient understands.)       Anesthesia Quick Evaluation

## 2023-11-29 ENCOUNTER — Encounter: Payer: Self-pay | Admitting: Vascular Surgery

## 2023-11-29 DIAGNOSIS — I2699 Other pulmonary embolism without acute cor pulmonale: Secondary | ICD-10-CM | POA: Diagnosis not present

## 2023-11-29 DIAGNOSIS — K559 Vascular disorder of intestine, unspecified: Secondary | ICD-10-CM | POA: Diagnosis not present

## 2023-11-29 DIAGNOSIS — F1910 Other psychoactive substance abuse, uncomplicated: Secondary | ICD-10-CM | POA: Diagnosis not present

## 2023-11-29 DIAGNOSIS — I96 Gangrene, not elsewhere classified: Secondary | ICD-10-CM | POA: Diagnosis not present

## 2023-11-29 LAB — HEMOGLOBIN AND HEMATOCRIT, BLOOD
HCT: 26.3 % — ABNORMAL LOW (ref 36.0–46.0)
Hemoglobin: 8.7 g/dL — ABNORMAL LOW (ref 12.0–15.0)

## 2023-11-29 LAB — PREPARE RBC (CROSSMATCH)

## 2023-11-29 MED ORDER — SODIUM CHLORIDE 0.9% IV SOLUTION: Freq: Once | INTRAVENOUS | Status: AC

## 2023-11-29 MED ORDER — POLYSACCHARIDE IRON COMPLEX 150 MG PO CAPS
150.0000 mg | ORAL_CAPSULE | Freq: Every day | ORAL | Status: DC
  Administered 2023-11-29 – 2023-11-30 (×2): 150 mg via ORAL
  Filled 2023-11-29 (×2): qty 1

## 2023-11-29 NOTE — Progress Notes (Signed)
 Patient ID: Penny Chase, female   DOB: 15-Sep-1970, 53 y.o.   MRN: [id number]     SURGICAL PROGRESS NOTE   Hospital Day(s): 5.   Interval History: Patient seen and examined, no acute events or new complaints overnight. Patient reports there has been no changes since yesterday night.  She denies any abdominal pain.  She denies any bowel movements or any obvious blood per rectum.  Hemoglobin last night 7.1.  No new CBC has been done.  Vital signs in last 24 hours: [min-max] current  Temp:  [90 F (32.2 C)-99 F (37.2 C)] 97.6 F (36.4 C) (04/15 0245) Pulse Rate:  [0-102] 72 (04/15 0245) Resp:  [8-20] 20 (04/15 0245) BP: (97-121)/(44-79) 104/60 (04/15 0245) SpO2:  [93 %-100 %] 97 % (04/15 0245)     Height: 5\' 4"  (162.6 cm) Weight: 81.6 kg BMI (Calculated): 30.88   Physical Exam:  Constitutional: alert, cooperative and no distress  Respiratory: breathing non-labored at rest  Cardiovascular: regular rate and sinus rhythm  Gastrointestinal: soft, non-tender, and non-distended  Labs:     Latest Ref Rng & Units 11/28/2023   10:03 PM 11/28/2023    9:48 AM 11/28/2023    5:29 AM  CBC  WBC 4.0 - 10.5 K/uL   29.2   Hemoglobin 12.0 - 15.0 g/dL 7.1  7.5  7.9   Hematocrit 36.0 - 46.0 % 21.6  23.2  23.6   Platelets 150 - 400 K/uL   585       Latest Ref Rng & Units 11/25/2023    8:16 AM 11/24/2023    2:54 AM 11/23/2023    9:49 AM  CMP  Glucose 70 - 99 mg/dL 086  578  469   BUN 6 - 20 mg/dL 15  15  14    Creatinine 0.44 - 1.00 mg/dL 6.29  5.28  4.13   Sodium 135 - 145 mmol/L 137  133  135   Potassium 3.5 - 5.1 mmol/L 4.2  3.8  3.9   Chloride 98 - 111 mmol/L 109  102  102   CO2 22 - 32 mmol/L 24  21  23    Calcium 8.9 - 10.3 mg/dL 8.4  8.3  8.7   Total Protein 6.5 - 8.1 g/dL  6.6  7.6   Total Bilirubin 0.0 - 1.2 mg/dL  0.5  0.3   Alkaline Phos 38 - 126 U/L  97  111   AST 15 - 41 U/L  28  38   ALT 0 - 44 U/L  27  33     Imaging studies: No new pertinent imaging studies   Assessment/Plan:   53 y.o. female with ischemic colitis with bleeding, complicated by pertinent comorbidities including pulmonary embolism, DVT, acute limb ischemia s/p left BKA, smoker, Hypertension.   - Patient continued to be stable today.  She has not had any bowel movement or any obvious blood per rectum - Hemoglobin 7.1 last night.  No new hemoglobin level today.  Agree with transfusion - Will continue following clinically with physical exam and following hemoglobin levels.  Continue conservative management.  Patient understand that if the bleeding cannot be controlled with conservative management, he will need subtotal colectomy with end ileostomy creation - Very complicated and difficult situation for the patient due to her known PE, DVT that is usually treated with anticoagulation but unable to treat with anticoagulation due to significant GI bleeding.  Gae Gallop, MD

## 2023-11-29 NOTE — Progress Notes (Signed)
  Progress Note    11/29/2023 4:42 PM 1 Day Post-Op  Subjective: Penny Chase is a 53 year old female now status postop day #1 from IVC filter placement.  Patient is resting comfortably in bed this afternoon.  Patient endorses that her right groin puncture site is sore but otherwise tolerable.  Patient has no other concerns at this time.  Patient is recovering as expected.  No other complaints.  Vitals are remained stable.   Vitals:   11/29/23 0922 11/29/23 1135  BP: 109/65 122/60  Pulse: 94 79  Resp: 18 18  Temp: 98.9 F (37.2 C) 98.9 F (37.2 C)  SpO2: 99% 100%   Physical Exam: Cardiac:  RRR, normal S1 and S2.  No rubs clicks or gallops noted. Lungs: Lungs clear throughout on auscultation.  No rales rhonchi or wheezing to noted.  Normal nonlabored breathing. Incisions: Left lower extremity with medial and lateral calf incisions closed with Dermabond glue.  Left groin incision with staples clean dry and intact.  Open to air. Extremities: Right lower extremity warm to touch with palpable pulses.  Left lower extremity with new BKA.  Dressing clean dry and intact. Abdomen: Positive bowel sounds throughout, soft, nontender nondistended. Neurologic: Third and oriented.  Cranial nerves completely intact.  CBC    Component Value Date/Time   WBC 29.2 (H) 11/28/2023 0529   RBC 2.54 (L) 11/28/2023 0529   HGB 8.7 (L) 11/29/2023 1313   HCT 26.3 (L) 11/29/2023 1313   PLT 585 (H) 11/28/2023 0529   MCV 92.9 11/28/2023 0529   MCH 31.1 11/28/2023 0529   MCHC 33.5 11/28/2023 0529   RDW 16.5 (H) 11/28/2023 0529   LYMPHSABS 2.0 11/24/2023 1047   MONOABS 1.5 (H) 11/24/2023 1047   EOSABS 0.6 (H) 11/24/2023 1047   BASOSABS 0.3 (H) 11/24/2023 1047    BMET    Component Value Date/Time   NA 137 11/25/2023 0816   K 4.2 11/25/2023 0816   CL 109 11/25/2023 0816   CO2 24 11/25/2023 0816   GLUCOSE 118 (H) 11/25/2023 0816   BUN 15 11/25/2023 0816   CREATININE 0.73 11/25/2023 0816   CALCIUM  8.4 (L) 11/25/2023 0816   GFRNONAA >60 11/25/2023 0816   GFRAA >60 05/31/2016 1152    INR    Component Value Date/Time   INR 1.1 11/23/2023 0949     Intake/Output Summary (Last 24 hours) at 11/29/2023 1642 Last data filed at 11/29/2023 1600 Gross per 24 hour  Intake 616 ml  Output --  Net 616 ml     Assessment/Plan:  53 y.o. female is s/p inferior vena cava filter placement.  1 Day Post-Op patient is recovering as expected.  Endorses soreness to the right groin but no other complaints.  Vitals remained stable.  Plan Plan is to change patient's left below the knee amputation first dressing tomorrow.  If everything is healing well and she continues to recover as expected patient may discharge when medically stable.  Patient had first wish to go home but it may be in her best interest to go to rehab at this time.  DVT prophylaxis: None due to GI bleeding.   Annamaria [facility] 11/29/2023 4:42 PM

## 2023-11-29 NOTE — Plan of Care (Signed)
 Problem: Education: Goal: Knowledge of General Education information will improve Description: Including pain rating scale, medication(s)/side effects and non-pharmacologic comfort measures 11/29/2023 0651 by Angellynn Kimberlin, Hillery Lown, RN Outcome: Progressing 11/29/2023 0134 by Jameca Chumley, Hillery Lown, RN Outcome: Progressing   Problem: Health Behavior/Discharge Planning: Goal: Ability to manage health-related needs will improve 11/29/2023 0651 by Tekesha Almgren, Hillery Lown, RN Outcome: Progressing 11/29/2023 0134 by Keondrick Dilks, Hillery Lown, RN Outcome: Progressing   Problem: Clinical Measurements: Goal: Ability to maintain clinical measurements within normal limits will improve 11/29/2023 0651 by Teion Ballin, Hillery Lown, RN Outcome: Progressing 11/29/2023 0134 by Risha Barretta, Hillery Lown, RN Outcome: Progressing Goal: Will remain free from infection 11/29/2023 0651 by Arwa Yero, Hillery Lown, RN Outcome: Progressing 11/29/2023 0134 by Jhalen Eley, Hillery Lown, RN Outcome: Progressing Goal: Diagnostic test results will improve 11/29/2023 0651 by Bonnye Halle, Hillery Lown, RN Outcome: Progressing 11/29/2023 0134 by Najma Bozarth, Hillery Lown, RN Outcome: Progressing Goal: Respiratory complications will improve 11/29/2023 0651 by Brendy Ficek, Hillery Lown, RN Outcome: Progressing 11/29/2023 0134 by Gal Feldhaus, Hillery Lown, RN Outcome: Progressing Goal: Cardiovascular complication will be avoided 11/29/2023 0651 by Tyke Outman, Hillery Lown, RN Outcome: Progressing 11/29/2023 0134 by Anelis Hrivnak, Hillery Lown, RN Outcome: Progressing   Problem: Activity: Goal: Risk for activity intolerance will decrease 11/29/2023 0651 by Maryanna Stuber, Hillery Lown, RN Outcome: Progressing 11/29/2023 0134 by Bryley Chrisman, Hillery Lown, RN Outcome: Progressing   Problem: Nutrition: Goal: Adequate nutrition will be maintained 11/29/2023 0651 by Jevon Shells, Hillery Lown, RN Outcome: Progressing 11/29/2023 0134 by Riku Buttery, Hillery Lown, RN Outcome: Progressing   Problem: Coping: Goal: Level of anxiety will  decrease 11/29/2023 0651 by Chayla Shands, Hillery Lown, RN Outcome: Progressing 11/29/2023 0134 by Zaiah Eckerson, Hillery Lown, RN Outcome: Progressing   Problem: Elimination: Goal: Will not experience complications related to bowel motility 11/29/2023 0651 by Fatina Sprankle, Hillery Lown, RN Outcome: Progressing 11/29/2023 0134 by Izzabella Besse, Hillery Lown, RN Outcome: Progressing Goal: Will not experience complications related to urinary retention 11/29/2023 0651 by Davaughn Hillyard, Hillery Lown, RN Outcome: Progressing 11/29/2023 0134 by Eliezer Khawaja, Hillery Lown, RN Outcome: Progressing   Problem: Pain Managment: Goal: General experience of comfort will improve and/or be controlled 11/29/2023 0651 by Roshonda Sperl, Hillery Lown, RN Outcome: Progressing 11/29/2023 0134 by Dwane Andres, Hillery Lown, RN Outcome: Progressing   Problem: Safety: Goal: Ability to remain free from injury will improve 11/29/2023 0651 by Saloma Cadena, Hillery Lown, RN Outcome: Progressing 11/29/2023 0134 by Scout Guyett, Hillery Lown, RN Outcome: Progressing   Problem: Skin Integrity: Goal: Risk for impaired skin integrity will decrease 11/29/2023 0651 by Eriana Suliman, Hillery Lown, RN Outcome: Progressing 11/29/2023 0134 by Khadar Monger, Hillery Lown, RN Outcome: Progressing   Problem: Education: Goal: Understanding of CV disease, CV risk reduction, and recovery process will improve 11/29/2023 0651 by Naftula Donahue, Hillery Lown, RN Outcome: Progressing 11/29/2023 0134 by Raeana Blinn, Hillery Lown, RN Outcome: Progressing Goal: Individualized Educational Video(s) 11/29/2023 0651 by Adamary Savary, Hillery Lown, RN Outcome: Progressing 11/29/2023 0134 by Shellby Schlink, Hillery Lown, RN Outcome: Progressing   Problem: Activity: Goal: Ability to return to baseline activity level will improve 11/29/2023 0651 by Divonte Senger, Hillery Lown, RN Outcome: Progressing 11/29/2023 0134 by Vyolet Sakuma, Hillery Lown, RN Outcome: Progressing   Problem: Cardiovascular: Goal: Ability to achieve and maintain adequate cardiovascular perfusion will  improve 11/29/2023 0651 by Cinda Hara, Hillery Lown, RN Outcome: Progressing 11/29/2023 0134 by Pinky Ravan, Hillery Lown, RN Outcome: Progressing Goal: Vascular access site(s) Level 0-1 will be maintained 11/29/2023 0651 by Jatavius Ellenwood, Hillery Lown, RN Outcome: Progressing 11/29/2023 0134 by Nattie Lazenby, Hillery Lown, RN Outcome: Progressing   Problem: Health Behavior/Discharge Planning: Goal: Ability to safely manage  health-related needs after discharge will improve 11/29/2023 0651 by Quantavious Eggert, Hillery Lown, RN Outcome: Progressing 11/29/2023 0134 by Moustafa Mossa, Hillery Lown, RN Outcome: Progressing

## 2023-11-29 NOTE — Progress Notes (Signed)
 [facility]  - [place] [facility]   PATIENT NAME: Penny Chase    MR#:  [id number]  DATE OF BIRTH:  [birth year]  SUBJECTIVE:  patient appears in great spirits today. Tolerating clear liquid diet. No further rectal bleed so far. Sr. at bedside.  Discussed importance of liquid diet for now. Getting one unit blood transfusion. VITALS:  Blood pressure 109/65, pulse 94, temperature 98.9 F (37.2 C), temperature source Oral, resp. rate 18, height 5\' 4"  (1.626 m), weight 81.6 kg, last menstrual period 09/20/2018, SpO2 99%.  PHYSICAL EXAMINATION:   GENERAL:  53 y.o.-year-old patient with no acute distress. Chronically  ill LUNGS: Normal breath sounds bilaterally, no wheezing CARDIOVASCULAR: S1, S2 normal. No murmur   ABDOMEN: Soft, nontender, nondistended. Bowel sounds present.  EXTREMITIES: left BKA dressing+ NEUROLOGIC: nonfocal  patient is alert and awake  LABORATORY PANEL:  CBC Recent Labs  Lab 11/28/23 0529 11/28/23 0948 11/28/23 2203  WBC 29.2*  --   --   HGB 7.9*   < > 7.1*  HCT 23.6*   < > 21.6*  PLT 585*  --   --    < > = values in this interval not displayed.    Chemistries  Recent Labs  Lab 11/24/23 0254 11/25/23 0816  NA 133* 137  K 3.8 4.2  CL 102 109  CO2 21* 24  GLUCOSE 124* 118*  BUN 15 15  CREATININE 0.68 0.73  CALCIUM 8.3* 8.4*  MG  --  2.2  AST 28  --   ALT 27  --   ALKPHOS 97  --   BILITOT 0.5  --     Assessment and Plan  Penny Chase is a 53 y.o. female with medical history significant of PE, hypertension, tobacco abuse, substance abuse, history of right breast cancer presenting with left lower extremity ischemia, GI bleeding, diverticulitis.  Patient reports worsening left foot pain redness and swelling over the past 1 to 2 days.  Noted to have been recently evaluated in the [facility] system March 29 through April 7 with issues including multiple subsegmental PEs, lower extremity arterial occlusion status post thrombectomy.    Lower GI bleeding suspected ischemic colitis Noted bloody bowel movements x 12 to 24 hours in the setting of anticoagulation use CT imaging with noted diverticulitis --monitor Hgb -- G.I. consultation with Dr. Doyle Askew -- was on  IV heparin drip given limb ischemia, PE, DVT, arterial thrombus. -- Patient understands the risk and complications of being of blood thinner and G.I. bleed --Stool studies negative for infection --Dr Allegra Lai plans EGD colonoscopy tomorrow  --EGD:- Normal esophagus.                        - Non-bleeding gastric ulcer with no stigmata of                         bleeding.                        - Gastritis with hemorrhage.                        - Non-bleeding duodenal ulcer with no stigmata of                         bleeding.                        -  Duodenitis. --Colonoscopy - Mucosal ulceration in the descending colon, at the                         splenic flexure and at the hepatic flexure.                        - Blood in the entire examined colon.                        - Diverticulosis in the entire examined colon.                        - Mucosal ulceration in the sigmoid colon. Clip (MR                         conditional) was placed. Clip manufacturer: [employer] --4/14-- given ongoing G.I. bleed discussed risk and benefits of IV heparin drip and now discontinued. Daughter and patient aware. -4/25-- seen by general surgery. They will continue to follow. Patient understands if she has recurrent bleed surgery would be recommended.  Acute blood loss anemia                        Anemia of chronic disease vitamin B12 deficiency/iron deficiency anemia -- came in with hemoglobin of 11.4--- 6.5-- 2 Unit PRBC--8.9--9.3--7.5--1 unit PRBC-- 7.1-- 1 unit PRBC -- will start patient on oral iron pills and B12    Limb ischemia Gangrene of left foot  --Worsening left lower extremity distal foot redness and discoloration concerning for limb ischemia  and dry gangrene Noted recent admission in the [facility] system for similar issues with patient refusing amputation. --Procedures at [facility] included iliac thrombus in addition to thrombus in the AT and PT. S/p L CFA/AT/PT cutdown, L AT/PT thromboembolectomy 11/14/23-patient refused amputation --per podiatry, no need for abx --angiogram on 4/10 with no intervention preformed --s/p left BKA per Dr Gilda Crease  PAD --per vascular, "plan for distal aortic stenting with coverage of the common iliac arteries in 4 to 6 weeks once the left groin has healed." --cont ASA and statin   PE (pulmonary thromboembolism) (HCC) Left LE DVT/Arterial thrombus underwent procedure at [facility] history of breast cancer Noted recent diagnosis in the [facility] system with PE --hold home Eliquis --cont heparin gtt for now since patient is reporting bright red blood per rectum occasionally.  -- HOLD heparin gtt  due to ongoing GI bleed -- discussed with patient to follow-up with Dr. Donneta Romberg for hyper coag state given history of breast cancer in the past   Leukocytosis --Marked white count of 28 on presentation --Suspect multifactorial with reactive component given multiple recent vascular procedures as well as suspected diverticulitis on CT scan -- patient has had chronically elevated white count since March 2025(unc labs)   Substance abuse (HCC) --tobacco and MJ   Tobacco abuse --1 pack/day smoker Discussed cessation at length --cont nicotine patch and gum   HTN (hypertension), benign --BP stable     DVT prophylaxis: SCD Code Status: Full code  Family Communication: none today Consult: vascular, GI Level of care: Med-Surg Dispo:   The patient is from: home Anticipated d/c is to: TBD Anticipated d/c date is: > 3 days       TOTAL TIME TAKING CARE OF THIS PATIENT: .  >50% time spent on counselling and coordination of  care  Note: This dictation was prepared with Dragon dictation along with smaller phrase  technology. Any transcriptional errors that result from this process are unintentional.  Melvinia Stager M.D    [facility]   CC: Primary care physician; Center, [facility]

## 2023-11-29 NOTE — Plan of Care (Signed)
  Problem: Clinical Measurements: Goal: Ability to maintain clinical measurements within normal limits will improve Outcome: Progressing   Problem: Nutrition: Goal: Adequate nutrition will be maintained Outcome: Progressing   Problem: Pain Managment: Goal: General experience of comfort will improve and/or be controlled Outcome: Progressing   Problem: Safety: Goal: Ability to remain free from injury will improve Outcome: Progressing

## 2023-11-29 NOTE — Plan of Care (Signed)

## 2023-11-29 NOTE — Progress Notes (Signed)
 The patient had an EGD and colonoscopy yesterday with the bleeding obviously coming from the colon ischemia.  This is not amenable to any GI intervention.  The patient had a filter placed due to the risk of PEs and the risk of continued anticoagulation.  Nothing further to do from GI point of view.  I will sign off.  Please call if any further GI concerns or questions.  We would like to thank you for the opportunity to participate in the care of Penny Chase.

## 2023-11-29 NOTE — Care Management Important Message (Signed)
 Important Message  Patient Details  Name: Penny Chase MRN: [id number] Date of Birth: 06-15-1971   Important Message Given:  Yes - Medicare IM     Barton Want W, CMA 11/29/2023, 1:32 PM

## 2023-11-29 NOTE — Evaluation (Signed)
 Physical Therapy Evaluation Patient Details Name: Penny Chase MRN: [id number] DOB: May 10, 1971 Today's Date: 11/29/2023  History of Present Illness  Patient is a 53 year old female with acute limb ischemia s/p left BKA. PE in association with lower GI bleed s/p IVC filter.  Clinical Impression  Patient is agreeable to PT evaluation. She is motivated to get out of bed. She is independent at baseline and lives with her spouse. They have 6 steps to enter the home and she has extensive DME in place already. She is requesting to return home at discharge.  Today the patient was able to stand from various surfaces, with and without rolling walker, with CGA. She hopped 31ft with rolling walker with cues for technique. Wheelchair mobility also initiated and patient propelled 19ft in hallway with occasional cues. Pain reported at 5/10 with no increased pain with mobility. Recommend to continue PT to maximize independence and decrease caregiver burden. She was open to home health PT but does not want to go to inpatient rehab at this time.       If plan is discharge home, recommend the following: A little help with walking and/or transfers;A little help with bathing/dressing/bathroom;Supervision due to cognitive status;Help with stairs or ramp for entrance;Assistance with cooking/housework   Can travel by private vehicle        Equipment Recommendations None recommended by PT  Recommendations for Other Services       Functional Status Assessment Patient has had a recent decline in their functional status and demonstrates the ability to make significant improvements in function in a reasonable and predictable amount of time.     Precautions / Restrictions Precautions Precautions: Fall Recall of Precautions/Restrictions: Intact Restrictions Weight Bearing Restrictions Per Provider Order: Yes LLE Weight Bearing Per Provider Order: Non weight bearing      Mobility  Bed Mobility Overal bed  mobility: Needs Assistance Bed Mobility: Supine to Sit     Supine to sit: Supervision          Transfers Overall transfer level: Needs assistance Equipment used: Rolling walker (2 wheels) Transfers: Sit to/from Stand, Bed to chair/wheelchair/BSC Sit to Stand: Contact guard assist Stand pivot transfers: Contact guard assist         General transfer comment: stand pivot transfer performed from bed to bed side commode without device. with education, patient able to stand from bed side commode with CGA using rolling walker    Ambulation/Gait Ambulation/Gait assistance: +2 safety/equipment, Contact guard assist, Min assist Gait Distance (Feet): 10 Feet Assistive device: Rolling walker (2 wheels)   Gait velocity: decreased     General Gait Details: patient able to complete hop steps in the room with no loss of balance using rolling walker. cues for safety and using rolling walker for support  Stairs Stairs: Yes       General stair comments: provided handout on how to navigate the steps at home using a wheelchair. patient has 6 steps to enter her home and plans to bump up or hop up. also educated patient on non emergent transportation option to get into the home safely. ramp is being built soon per patient report  [employer]chant [employer] officer mobility: Yes Wheelchair propulsion: Both upper extremities Wheelchair parts: Supervision/cueing Distance: 100 Wheelchair Assistance Details (indicate cue type and reason): patient able to navigate around the nursing station with no physical assistance required. occasional cues for technique. assistance provided with navigation in tight spaces. patient demonstrated correct use of breaks when parked. education  provided on leg elevation/leg rest for positioning of LLE for comfort   Tilt Bed    Modified Rankin (Stroke Patients Only)       Balance Overall balance assessment: Needs assistance Sitting-balance  support: Feet supported Sitting balance-Leahy Scale: Good     Standing balance support: Bilateral upper extremity supported Standing balance-Leahy Scale: Fair Standing balance comment: using rolling walker for support                             Pertinent Vitals/Pain Pain Assessment Pain Assessment: 0-10 Pain Score: 5     Home Living Family/patient expects to be discharged to:: Private residence Living Arrangements: Spouse/significant other Available Help at Discharge: Family;Available PRN/intermittently;Other (Comment) Type of Home: House Home Access: Stairs to enter Entrance Stairs-Rails: Right;Left (cannot reach both) Entrance Stairs-Number of Steps: 6 Alternate Level Stairs-Number of Steps: flight Home Layout: Multi-level;Able to live on main level with bedroom/bathroom Home Equipment: Tub bench;Rolling Walker (2 wheels);Wheelchair - manual;Cane - quad;BSC/3in1      Prior Function Prior Level of Function : Driving;History of Falls (last six months);Independent/Modified Independent             Mobility Comments: independent ADLs Comments: independent     Extremity/Trunk Assessment   Upper Extremity Assessment Upper Extremity Assessment: Overall WFL for tasks assessed    Lower Extremity Assessment Lower Extremity Assessment: LLE deficits/detail LLE Deficits / Details: patient able to full extend knee and activate hip movement       Communication   Communication Communication: No apparent difficulties    Cognition Arousal: Alert Behavior During Therapy: WFL for tasks assessed/performed   PT - Cognitive impairments: No apparent impairments                         Following commands: Intact       Cueing Cueing Techniques: Verbal cues, Visual cues     General Comments      Exercises     Assessment/Plan    PT Assessment Patient needs continued PT services  PT Problem List Decreased strength;Decreased range of  motion;Decreased activity tolerance;Decreased balance;Decreased mobility;Decreased safety awareness;Decreased knowledge of use of DME       PT Treatment Interventions DME instruction;Stair training;Gait training;Functional mobility training;Therapeutic activities;Therapeutic exercise;Neuromuscular re-education;Balance training;Patient/family education;Wheelchair mobility training    PT Goals (Current goals can be found in the Care Plan section)  Acute Rehab PT Goals Patient Stated Goal: to go home PT Goal Formulation: With patient Time For Goal Achievement: 12/13/23 Potential to Achieve Goals: Fair Additional Goals Additional Goal #1: patient will propel wheelchair 220ft, Mod I in preparation for community mobility    Frequency Min 3X/week     Co-evaluation PT/OT/SLP Co-Evaluation/Treatment: Yes Reason for Co-Treatment: Complexity of the patient's impairments (multi-system involvement) PT goals addressed during session: Mobility/safety with mobility OT goals addressed during session: ADL's and self-care;Proper use of Adaptive equipment and DME       AM-PAC PT "6 Clicks" Mobility  Outcome Measure Help needed turning from your back to your side while in a flat bed without using bedrails?: None Help needed moving from lying on your back to sitting on the side of a flat bed without using bedrails?: A Little Help needed moving to and from a bed to a chair (including a wheelchair)?: A Little Help needed standing up from a chair using your arms (e.g., wheelchair or bedside chair)?: A Little Help needed to walk in  hospital room?: A Little Help needed climbing 3-5 steps with a railing? : A Lot 6 Click Score: 18    End of Session   Activity Tolerance: Patient tolerated treatment well Patient left:  (sitting in wheelchair per patient request) Nurse Communication: Mobility status PT Visit Diagnosis: Difficulty in walking, not elsewhere classified (R26.2);Muscle weakness (generalized)  (M62.81)    Time: 1316-1400 PT Time Calculation (min) (ACUTE ONLY): 44 min   Charges:   PT Evaluation $PT Eval Moderate Complexity: 1 Mod PT Treatments $Therapeutic Activity: 8-22 mins PT General Charges $$ ACUTE PT VISIT: 1 Visit         Ozie Bo, PT, MPT   Erlene Hawks 11/29/2023, 2:27 PM

## 2023-11-29 NOTE — Progress Notes (Signed)
 Occupational Therapy Evaluation Patient Details Name: Penny Chase MRN: [id number] DOB: 1971/05/24 Today's Date: 11/29/2023   History of Present Illness   Patient is a 53 year old female with acute limb ischemia s/p left BKA. PE in association with lower GI bleed s/p IVC filter.     Clinical Impressions Pt was seen for OT evaluation this date. Prior to hospital admission, pt was indep in IADL/ADLs. Pt lives with her husband in home with 6 steps to enter. Pt presents to acute OT demonstrating impaired ADL performance and functional mobility 2/2 (See OT problem list for additional functional deficits). Pt complete transferring from EOB via stand pivot to [facility] with no DME. Pericare completed with MODI, no physical assistance required. Pt tolerated STS from the [facility]  with use of the RW,  amb to wheelchair ~21ft with verbal sequencing education. Pt completed wheelchair mobility within the hallway, pt propelled 121ft in hallway with occasional cues. Pt would benefit from skilled OT services to address noted impairments and functional limitations (see below for any additional details) in order to maximize safety and independence while minimizing falls risk and caregiver burden. OT will follow acutely.    If plan is discharge home, recommend the following:   A lot of help with walking and/or transfers;A little help with bathing/dressing/bathroom;Assistance with cooking/housework;Assist for transportation;Help with stairs or ramp for entrance     Functional Status Assessment   Patient has had a recent decline in their functional status and demonstrates the ability to make significant improvements in function in a reasonable and predictable amount of time.     Equipment Recommendations   None recommended by OT     Recommendations for Other Services         Precautions/Restrictions   Precautions Precautions: Fall Recall of Precautions/Restrictions: Intact Restrictions Weight Bearing  Restrictions Per Provider Order: Yes     Mobility Bed Mobility Overal bed mobility: Needs Assistance Bed Mobility: Supine to Sit     Supine to sit: Supervision          Transfers Overall transfer level: Needs assistance Equipment used: Rolling walker (2 wheels) Transfers: Sit to/from Stand, Bed to chair/wheelchair/BSC Sit to Stand: Contact guard assist Stand pivot transfers: Contact guard assist         General transfer comment: Pt complete t/f from EOB via stand pivot t/f to [facility] with no DME. Pt tolerated standing with RW from BSC amb to w/c ~57ft with verbal sequencing education.      Balance Overall balance assessment: Needs assistance Sitting-balance support: Feet supported Sitting balance-Leahy Scale: Good     Standing balance support: Bilateral upper extremity supported, Reliant on assistive device for balance Standing balance-Leahy Scale: Fair                             ADL either performed or assessed with clinical judgement   ADL Overall ADL's : Needs assistance/impaired Eating/Feeding: Independent   Grooming: Wash/dry face;Wash/dry hands;Sitting;Modified independent                   Statistician: Forensic psychologist and Hygiene: Modified independent;Sitting/lateral lean       Functional mobility during ADLs: Wheelchair;Rolling walker (2 wheels) General ADL Comments: Pt t/f self to the BSC from EOB via stand pivot      Pertinent Vitals/Pain Pain Assessment Pain Assessment: 0-10 Pain Score: 5      Extremity/Trunk Assessment Upper  Extremity Assessment Upper Extremity Assessment: Overall WFL for tasks assessed   Lower Extremity Assessment Lower Extremity Assessment: LLE deficits/detail LLE Deficits / Details: patient able to full extend knee and activate hip movement       Communication Communication Communication: No apparent difficulties   Cognition Arousal:  Alert Behavior During Therapy: WFL for tasks assessed/performed Cognition: No apparent impairments             OT - Cognition Comments: A/Ox4                 Following commands: Intact       Cueing  General Comments   Cueing Techniques: Verbal cues;Visual cues  Post op dressing in tact pre/post session   Exercises Exercises: Other exercises Other Exercises Other Exercises: Edu: role of OT, benefits of HH, amb sequencing with DME, edu on desentizing techniques   Shoulder Instructions      Home Living Family/patient expects to be discharged to:: Private residence Living Arrangements: Spouse/significant other Available Help at Discharge: Family;Available PRN/intermittently;Other (Comment) Type of Home: House Home Access: Stairs to enter [employer] of Steps: 6 Entrance Stairs-Rails: Right;Left (cannot reach both) Home Layout: Multi-level;Able to live on main level with bedroom/bathroom Alternate Level Stairs-Number of Steps: flight   Bathroom Shower/Tub: Tub/shower unit         Home Equipment: Tub bench;Rolling Walker (2 wheels);Wheelchair - manual;Cane - quad;BSC/3in1          Prior Functioning/Environment Prior Level of Function : Driving;History of Falls (last six months);Independent/Modified Independent             Mobility Comments: independent ADLs Comments: independent    OT Problem List: Decreased strength;Decreased activity tolerance;Decreased coordination;Decreased knowledge of use of DME or AE;Decreased safety awareness   OT Treatment/Interventions: Self-care/ADL training;Therapeutic exercise;Therapeutic activities;Patient/family education;Balance training;DME and/or AE instruction;Energy conservation      OT Goals(Current goals can be found in the care plan section)   Acute Rehab OT Goals Patient Stated Goal: remain independent OT Goal Formulation: With patient Time For Goal Achievement: 12/13/23 Potential to Achieve  Goals: Good ADL Goals Pt Will Perform Grooming: with modified independence Pt Will Perform Lower Body Dressing: sit to/from stand;with modified independence Pt Will Transfer to Toilet: with modified independence;ambulating Pt Will Perform Toileting - Clothing Manipulation and hygiene: with modified independence   OT Frequency:  Min 3X/week    Co-evaluation PT/OT/SLP Co-Evaluation/Treatment: Yes Reason for Co-Treatment: Complexity of the patient's impairments (multi-system involvement) PT goals addressed during session: Mobility/safety with mobility OT goals addressed during session: ADL's and self-care;Proper use of Adaptive equipment and DME      AM-PAC OT "6 Clicks" Daily Activity     Outcome Measure Help from another person eating meals?: None Help from another person taking care of personal grooming?: A Little Help from another person toileting, which includes using toliet, bedpan, or urinal?: A Little Help from another person bathing (including washing, rinsing, drying)?: A Little Help from another person to put on and taking off regular upper body clothing?: None Help from another person to put on and taking off regular lower body clothing?: A Little 6 Click Score: 20   End of Session Equipment Utilized During Treatment: Rolling walker (2 wheels);Other (comment) Furniture conservator/restorer) Nurse Communication: Mobility status  Activity Tolerance: Patient tolerated treatment well Patient left: Other (comment);with call bell/phone within reach (In wheelchair)  OT Visit Diagnosis: Unsteadiness on feet (R26.81);Other abnormalities of gait and mobility (R26.89)  Time: 1610-9604 OT Time Calculation (min): 29 min Charges:  OT General Charges $OT Visit: 1 Visit OT Evaluation $OT Eval Moderate Complexity: 1 Mod  Vipul Cafarelli M.S. OTR/L  11/29/23, 3:18 PM

## 2023-11-30 ENCOUNTER — Other Ambulatory Visit: Payer: Self-pay | Admitting: Internal Medicine

## 2023-11-30 ENCOUNTER — Telehealth: Payer: Self-pay | Admitting: Internal Medicine

## 2023-11-30 ENCOUNTER — Other Ambulatory Visit: Payer: Self-pay

## 2023-11-30 DIAGNOSIS — I2699 Other pulmonary embolism without acute cor pulmonale: Secondary | ICD-10-CM

## 2023-11-30 DIAGNOSIS — I998 Other disorder of circulatory system: Secondary | ICD-10-CM | POA: Diagnosis not present

## 2023-11-30 LAB — TYPE AND SCREEN
ABO/RH(D): A POS
Antibody Screen: NEGATIVE
Unit division: 0
Unit division: 0
Unit division: 0

## 2023-11-30 LAB — BPAM RBC
Blood Product Expiration Date: 202505132359
Blood Product Expiration Date: 202505132359
Blood Product Unit Number: 202505132359
ISSUE DATE / TIME: 202504141513
ISSUE DATE / TIME: 202504150846
Unit Type and Rh: 202505132359
Unit Type and Rh: 202505132359
Unit Type and Rh: 6200
Unit Type and Rh: 6200
Unit Type and Rh: 6200

## 2023-11-30 LAB — HEMOGLOBIN AND HEMATOCRIT, BLOOD
HCT: 28.1 % — ABNORMAL LOW (ref 36.0–46.0)
Hemoglobin: 9.4 g/dL — ABNORMAL LOW (ref 12.0–15.0)

## 2023-11-30 LAB — SURGICAL PATHOLOGY

## 2023-11-30 LAB — ANTITHROMBIN III: AntiThromb III Func: 114 % (ref 75–120)

## 2023-11-30 LAB — PREPARE RBC (CROSSMATCH)

## 2023-11-30 MED ORDER — POLYSACCHARIDE IRON COMPLEX 150 MG PO CAPS: 150.0000 mg | ORAL_CAPSULE | Freq: Every day | ORAL | 0 refills | Status: AC

## 2023-11-30 MED ORDER — PANTOPRAZOLE SODIUM 40 MG PO TBEC: 40.0000 mg | DELAYED_RELEASE_TABLET | Freq: Two times a day (BID) | ORAL | 1 refills | Status: AC

## 2023-11-30 MED ORDER — FOLIC ACID 1 MG PO TABS: 1.0000 mg | ORAL_TABLET | Freq: Every day | ORAL | 0 refills | Status: AC

## 2023-11-30 MED ORDER — CYANOCOBALAMIN 1000 MCG PO TABS: 1000.0000 ug | ORAL_TABLET | Freq: Every day | ORAL | 2 refills | Status: AC

## 2023-11-30 MED ORDER — OXYCODONE HCL 5 MG PO TABS: 5.0000 mg | ORAL_TABLET | Freq: Four times a day (QID) | ORAL | 0 refills | Status: DC | PRN

## 2023-11-30 MED ORDER — SUCRALFATE 1 GM/10ML PO SUSP: 1.0000 g | Freq: Three times a day (TID) | ORAL | 0 refills | Status: DC

## 2023-11-30 MED ORDER — GABAPENTIN 600 MG PO TABS: 600.0000 mg | ORAL_TABLET | Freq: Three times a day (TID) | ORAL | 0 refills | Status: AC

## 2023-11-30 MED ORDER — OXYCODONE HCL 5 MG PO TABS
5.0000 mg | ORAL_TABLET | Freq: Three times a day (TID) | ORAL | 0 refills | Status: DC | PRN
Start: 2023-11-30 — End: 2023-12-08

## 2023-11-30 MED ORDER — NICOTINE 21 MG/24HR TD PT24: 21.0000 mg | MEDICATED_PATCH | Freq: Every day | TRANSDERMAL | 0 refills | Status: DC

## 2023-11-30 NOTE — Discharge Summary (Addendum)
 Physician Discharge Summary   Patient: Penny Chase MRN: [id number] DOB: 03/05/1971  Admit date:     11/23/2023  Discharge date: 11/30/23  Discharge Physician: Enedina Finner   PCP: [facility]   Recommendations at discharge:   follow-up G.I. Dr. Allegra Lai in 1 to 2 week follow-up Dr. Donneta Romberg at the cancer center for PE/DVT/hyper coagulopathy workup follow-up Dr. Gilda Crease in 1 to 2 weeks follow-up PCP in 1 to 2 weeks patient advised smoking cessation  Discharge Diagnoses: Principal Problem:   Limb ischemia Active Problems:   PE (pulmonary thromboembolism) (HCC)   GI bleeding   Leukocytosis   Gastroesophageal reflux disease without esophagitis   HTN (hypertension), benign   Gangrene of left foot (HCC)   Tobacco abuse   Substance abuse (HCC)   Blister of left foot   Bloody stools   Rectal bleeding   Acute blood loss anemia   Multiple gastric ulcers   Ischemic colitis (HCC)  Penny Chase is a 53 y.o. female with medical history significant of PE, hypertension, tobacco abuse, substance abuse, history of right breast cancer presenting with left lower extremity ischemia, GI bleeding, diverticulitis.  Patient reports worsening left foot pain redness and swelling over the past 1 to 2 days.  Noted to have been recently evaluated in the [facility] system March 29 through April 7 with issues including multiple subsegmental PEs, lower extremity arterial occlusion status post thrombectomy.    Lower GI bleeding suspected ischemic colitis Noted bloody bowel movements x 12 to 24 hours in the setting of anticoagulation use CT imaging with noted diverticulitis --monitor Hgb -- G.I. consultation with Dr. Doyle Askew -- was on  IV heparin drip given limb ischemia, PE, DVT, arterial thrombus. -- Patient understands the risk and complications of being of blood thinner and G.I. bleed --Stool studies negative for infection --Dr Allegra Lai plans EGD colonoscopy tomorrow   --EGD:- Normal  esophagus.                        - Non-bleeding gastric ulcer with no stigmata of                         bleeding.                        - Gastritis with hemorrhage.                        - Non-bleeding duodenal ulcer with no stigmata of                         bleeding.                        - Duodenitis. --Colonoscopy - Mucosal ulceration in the descending colon, at the                         splenic flexure and at the hepatic flexure.                        - Blood in the entire examined colon.                        - Diverticulosis in the entire examined colon.                        -  Mucosal ulceration in the sigmoid colon. Clip (MR                         conditional) was placed. Clip manufacturer: [employer] --4/14-- given ongoing G.I. bleed discussed risk and benefits of IV heparin drip and now discontinued. Daughter and patient aware. -4/15-- seen by general surgery. They will continue to follow. Patient understands if she has recurrent bleed surgery would be recommended. --4/16-- overall doing well. No blood he stools. Tolerating PO diet had food brought in by family. Patient eager to go home.   Acute blood loss anemia                        Anemia of chronic disease vitamin B12 deficiency/iron deficiency anemia -- came in with hemoglobin of 11.4--- 6.5-- 2 Unit PRBC--8.9--9.3--7.5--1 unit PRBC-- 7.1-- 1 unit PRBC--8.7--9.4 -- will start patient on oral iron pills and B12     Limb ischemia Gangrene of left foot on admisssion  --Worsening left lower extremity distal foot redness and discoloration concerning for limb ischemia and dry gangrene Noted recent admission in the [facility] system for similar issues with patient refusing amputation. --Procedures at [facility] included iliac thrombus in addition to thrombus in the AT and PT. S/p L CFA/AT/PT cutdown, L AT/PT thromboembolectomy 11/14/23-patient refused amputation --per podiatry, no need for abx --angiogram on 4/10 with  no intervention preformed --s/p left BKA per Dr Gilda Crease   PAD --per vascular, "plan for distal aortic stenting with coverage of the common iliac arteries in 4 to 6 weeks once the left groin has healed." --cont ASA and statin   PE (pulmonary thromboembolism) (HCC) Left LE DVT/Arterial thrombus underwent procedure at [facility] history of breast cancer Noted recent diagnosis in the [facility] system with PE --hold home Eliquis --cont heparin gtt for now since patient is reporting bright red blood per rectum occasionally.  -- HOLD heparin gtt  due to ongoing GI bleed -- discussed with patient to follow-up with Dr. Donneta Romberg for hyper coag state given history of breast cancer in the past--dr B aware and Labs collected   Leukocytosis --Marked white count of 28 on presentation --Suspect multifactorial with reactive component given multiple recent vascular procedures as well as suspected diverticulitis on CT scan -- patient has had chronically elevated white count since March 2025(unc labs)   Substance abuse (HCC) --tobacco and MJ   Tobacco abuse --1 pack/day smoker Discussed cessation at length --cont nicotine patch and gum   HTN (hypertension), benign --BP stable   discharge home with home health PT OT and RN for BKA dressing changes   DVT prophylaxis: SCD Code Status: Full code  Family Communication: sister at bedside Consult: vascular, GI, general surgery    Pain control - [place] Controlled Substance Reporting System database was reviewed. and patient was instructed, not to drive, operate heavy machinery, perform activities at heights, swimming or participation in water activities or provide baby-sitting services while on Pain, Sleep and Anxiety Medications; until their outpatient Physician has advised to do so again. Also recommended to not to take more than prescribed Pain, Sleep and Anxiety Medications.   Procedures performed: left BKA, EGD, colonoscopy Disposition: Home  health Diet recommendation:  Discharge Diet Orders (From admission, onward)     Start     Ordered   11/30/23 0000  Diet - low sodium heart healthy        11/30/23 1414  Cardiac diet DISCHARGE MEDICATION: Allergies as of 11/30/2023       Reactions   Amlodipine Swelling        Medication List     STOP taking these medications    apixaban 5 MG Tabs tablet Commonly known as: ELIQUIS   DSS 100 MG Caps   polyethylene glycol 17 g packet Commonly known as: MIRALAX / GLYCOLAX   senna 8.6 MG tablet Commonly known as: SENOKOT   tiZANidine 2 MG tablet Commonly known as: ZANAFLEX       TAKE these medications    acetaminophen 500 MG tablet Commonly known as: TYLENOL Take 500 mg by mouth every 6 (six) hours as needed for moderate pain.   aspirin 81 MG chewable tablet Chew 81 mg by mouth daily.   celecoxib 100 MG capsule Commonly known as: CELEBREX Take 100 mg by mouth 2 (two) times daily.   citalopram 20 MG tablet Commonly known as: CELEXA Take 20 mg by mouth daily.   cyanocobalamin 1000 MCG tablet Take 1 tablet (1,000 mcg total) by mouth daily. Start taking on: December 01, 2023   folic acid 1 MG tablet Commonly known as: FOLVITE Take 1 tablet (1 mg total) by mouth daily. Start taking on: December 01, 2023   gabapentin 600 MG tablet Commonly known as: NEURONTIN Take 1 tablet (600 mg total) by mouth 3 (three) times daily.   hydrochlorothiazide 12.5 MG tablet Commonly known as: HYDRODIURIL Take 12.5 mg by mouth daily.   iron polysaccharides 150 MG capsule Commonly known as: NIFEREX Take 1 capsule (150 mg total) by mouth daily. Start taking on: December 01, 2023   nicotine 21 mg/24hr patch Commonly known as: NICODERM CQ - dosed in mg/24 hours Place 1 patch (21 mg total) onto the skin daily. Start taking on: December 01, 2023   oxyCODONE 5 MG immediate release tablet Commonly known as: Oxy IR/ROXICODONE Take 1 tablet (5 mg total) by mouth every 6  (six) hours as needed for severe pain (pain score 7-10). What changed: reasons to take this   pantoprazole 40 MG tablet Commonly known as: Protonix Take 1 tablet (40 mg total) by mouth 2 (two) times daily before a meal.   pravastatin 10 MG tablet Commonly known as: PRAVACHOL Take 1 tablet by mouth daily.   sucralfate 1 GM/10ML suspension Commonly known as: CARAFATE Take 10 mLs (1 g total) by mouth 4 (four) times daily -  with meals and at bedtime.               Discharge Care Instructions  (From admission, onward)           Start     Ordered   11/30/23 0000  Discharge wound care:       Comments: Vascular surgery dressing changes postop:  Removal of old dressing  Cover staples with Xeroform gauze then  Cover gauze with 4 x 4's then  Cover with ABD pads x 2 then  Wrap with Kerlix then  Wrap snugly with 6 inch Ace bandage.  Dressing changes need to be done daily until staple removal.   11/30/23 1414            Follow-up Information     Georgiana Spinner, NP Follow up in 3 week(s).   Specialty: Vascular Surgery Why: Wound Check and staple removal. Office has V/M set up; left msg to call patient w/ Appt Contact information: [address] [place] [address] [phone]  [facility] Schedule an appointment as soon as possible for a visit in 1 week(s).   Specialty: General Practice Contact information: [address] [place] [address] [phone]         Toney Reil, MD. Schedule an appointment as soon as possible for a visit in 2 week(s).   Specialty: Gastroenterology Why: Ischemic colitis Contact information: [address] [place] [address] [phone]         Earna Coder, MD. Schedule an appointment as soon as possible for a visit in 1 week(s).   Specialties: Internal Medicine, Oncology Why: f/u PE/DVT and hypercoag workup Contact  information: [address] [place] [address] [phone]                Discharge Exam: Ceasar Mons Weights   11/23/23 0902  Weight: 81.6 kg  GENERAL:  53 y.o.-year-old patient with no acute distress. Chronically  ill LUNGS: Normal breath sounds bilaterally, no wheezing CARDIOVASCULAR: S1, S2 normal. No murmur   ABDOMEN: Soft, nontender, nondistended. Bowel sounds present.  EXTREMITIES: left BKA dressing+ NEUROLOGIC: nonfocal  patient is alert and awake   Condition at discharge: fair  The results of significant diagnostics from this hospitalization (including imaging, microbiology, ancillary and laboratory) are listed below for reference.   Imaging Studies: PERIPHERAL VASCULAR CATHETERIZATION Result Date: 11/28/2023 See surgical note for result.  NM GI Blood Loss Result Date: 11/27/2023 CLINICAL DATA:  Gastrointestinal hemorrhage. EXAM: NUCLEAR MEDICINE GASTROINTESTINAL BLEEDING SCAN TECHNIQUE: Sequential abdominal images were obtained following intravenous administration of Tc-80m labeled red blood cells. RADIOPHARMACEUTICALS:  23.72 mCi Tc-93m pertechnetate in-vitro labeled red cells. COMPARISON:  None Available. FINDINGS: There is expected uptake of radiotracer within the intravascular space, liver, and spleen. Expected gradual accumulation within bladder. No evidence of active gastrointestinal hemorrhage. IMPRESSION: No active gastrointestinal hemorrhage. Electronically Signed   By: Helyn Numbers M.D.   On: 11/27/2023 21:23   [place] OR NERVE BLOCK-IMAGE ONLY [facility]) Result Date: 11/26/2023 There is no interpretation for this exam.  This order is for images obtained during a surgical procedure.  Please See "Surgeries" Tab for more information regarding the procedure.   PERIPHERAL VASCULAR CATHETERIZATION Result Date: 11/24/2023 See surgical note for result.  CT Angio Aortobifemoral W and/or Wo Contrast Result Date: 11/23/2023 CLINICAL DATA:  53 year old female with a  history of Recen arterial thrombus left lower extremity, now with blisters to left foot. Recently started anticoagulation, now with bloody diarrhea. EXAM: CT ANGIOGRAPHY OF ABDOMINAL AORTA WITH ILIOFEMORAL RUNOFF TECHNIQUE: Multidetector CT imaging of the abdomen, pelvis and lower extremities was performed using the standard protocol during bolus administration of intravenous contrast. Multiplanar CT image reconstructions and MIPs were obtained to evaluate the vascular anatomy. RADIATION DOSE REDUCTION: This exam was performed according to the departmental dose-optimization program which includes automated exposure control, adjustment of the mA and/or kV according to patient size and/or use of iterative reconstruction technique. CONTRAST:  OMNIPAQUE IOHEXOL 350 MG/ML SOLN COMPARISON:  None Available. FINDINGS: VASCULAR VASCULAR Aorta: Unremarkable course, caliber, contour of the abdominal aorta. No dissection, aneurysm, or periaortic fluid. Moderate mixed calcified and atheromatous plaque without pedunculated plaque. Celiac: Patent, with no significant atherosclerotic changes. SMA: Patent, with no significant atherosclerotic changes. Renals: - Right: Single right renal artery. Mixed calcified and soft plaque at the origin with estimated 50% or less narrowing. - Left: Single left renal artery. Mixed calcified and soft plaque at the origin without significant narrowing. IMA: Inferior mesenteric artery is patent. Right  lower extremity: Mixed atheromatous and calcified plaque at the origin of the common iliac artery, with estimated 50% narrowing. Calcified plaque through the remainder of the CIA without narrowing. Hypogastric artery is patent with patency of the pelvic arteries. External iliac artery patent without significant atherosclerotic changes. Minimal atherosclerosis of the common femoral artery. Profunda femoris and the thigh branches are patent. SFA is patent without significant atherosclerotic changes.  Popliteal artery patent. Typical trifurcation with patency of the proximal anterior tibial artery, the tibioperoneal trunk, proximal peroneal artery and proximal posterior tibial artery. The distal tibial arteries of the right lower extremity are limited in evaluation given the timing of the contrast bolus. Despite this, at least anterior tibial artery and posterior tibial artery appear patent to the ankle. Left lower extremity: Mixed atheromatous and calcified plaque at the origin the left common iliac artery. No high-grade stenosis. Calcifications through the length of the common iliac artery which is patent. Hypogastric artery patent.  Pelvic arteries patent. External iliac artery patent without significant atherosclerotic changes. No significant atherosclerotic changes of the common femoral artery. Profunda femoris and the thigh branches patent. Superficial femoral artery is patent throughout its length without significant atherosclerotic changes. Popliteal artery patent. Anterior tibial artery origin patent. Tibioperoneal trunk patent. The distal tibial arteries of the left lower extremity are limited given the timing of the contrast bolus. The CTA is nondiagnostic for patency of the tibial arteries in the mid and distal lower leg. Veins: Unremarkable appearance of the unopacified venous system. Review of the MIP images confirms the above findings. NON-VASCULAR Lower chest: Mix tree-in-bud and centrilobular nodularity in the bilateral lung bases. No comparison. No pleural effusion. No significant bronchial wall thickening or endobronchial debris. Hepatobiliary: Focal fatty infiltration adjacent to the falciform ligament. Unremarkable gall bladder. Pancreas: Unremarkable. Spleen: Unremarkable. Adrenals/Urinary Tract: - Right adrenal gland: Unremarkable - Left adrenal gland: Unremarkable. - Right kidney: No hydronephrosis, nephrolithiasis, inflammation, or ureteral dilation. No focal lesion. - Left Kidney: No  hydronephrosis, nephrolithiasis, inflammation, or ureteral dilation. No focal lesion. - Urinary Bladder: Unremarkable. Stomach/Bowel: - Stomach: Unremarkable. - Small bowel: Unremarkable - Appendix: Normal. - Colon: The colon is fluid-filled from the hepatic flexure through the transverse colon, splenic flexure, descending colon and sigmoid colon. Diverticula present within the sigmoid colon with some associated wall thickening and mild local inflammation of the fascia/mesentery. Lymphatic: No adenopathy. Mesenteric: No free fluid or air. No mesenteric adenopathy. Reproductive: Unremarkable appearance of the pelvic organs. Other: Surgical changes of the left inguinal region with some fatty stranding and small focal fluid. No evidence arterial pseudoaneurysm. No evidence of hematoma or abscess. Surgical staples present within the superficial soft tissues. Musculoskeletal: No evidence of acute fracture. No bony canal narrowing. No significant degenerative changes of the hips. IMPRESSION: CT angiogram demonstrates predominantly aorto iliac disease, including: -moderate irregular aortic atherosclerosis, with mixed atheromatous and calcified plaque. Aortic Atherosclerosis (ICD10-I70.0). -bilateral common iliac arterial disease with mixed calcified and atheromatous plaque. There is estimated 50% or less narrowing at the origin of the right CIA, with no significant narrowing of the left CIA. The CT is nondiagnostic for evaluation of the left-sided tibial arteries, and patency cannot be confirmed below the popliteal artery and trifurcation. Incidental subsegmental pulmonary embolism of the right lung base. Bilateral renal arterial disease, without high-grade stenosis. Fluid-filled colon, can be seen with nonspecific enteritis/colitis and correlation with symptoms may be useful. There is questionable early inflammatory changes in the wall of the sigmoid colon, at the site of the most pronounced  diverticular disease,  potentially representing early diverticulitis. Mixed tree-in-bud and centrilobular nodularity of the lung bases. This may be inflammatory/infectious or alternatively could be related to the patient's known diagnosis of PE/VTE. Additional ancillary findings as above. Signed, Marciano Settles. Rexine Cater, RPVI [facility] Electronically Signed   By: Myrlene Asper D.O.   On: 11/23/2023 13:20   DG Foot Complete Left Result Date: 11/23/2023 CLINICAL DATA:  Recent arterial occlusion of left lower extremity. Complicated by apparent gangrene. New cluster development. Evaluate for underlying gas. Toes blistered and black. EXAM: LEFT FOOT - COMPLETE 3+ VIEW COMPARISON:  Left calcaneus radiographs 12/01/2020 FINDINGS: There is a moderate plantar calcaneal heel spur, unchanged. Minimal chronic enthesopathic change at the Achilles insertion on the calcaneus. Mild distal anterior tibial plafond and and adjacent talar dome-neck junction degenerative osteophytosis. 4 mm ossicle posterior to the talus on lateral view is unchanged from prior and chronic. Additional adjacent 2 mm ossicle not definitely previously seen. Mild posterior dorsal navicular degenerative spurring. Well corticated ossicle measuring up to 8 mm just distal to the fibula. No acute fracture dislocation. IMPRESSION: 1. Moderate plantar calcaneal heel spur, unchanged. 2. Mild distal anterior tibial plafond and adjacent talar dome-neck junction degenerative osteophytosis. 3. Well corticated, chronic ossicle measuring up to 8 mm just distal to the fibula, likely the sequela of remote trauma. Electronically Signed   By: Bertina Broccoli M.D.   On: 11/23/2023 10:59    Microbiology: Results for orders placed or performed during the hospital encounter of 11/23/23  Gastrointestinal Panel by PCR , Stool     Status: None   Collection Time: 11/23/23 11:16 AM   Specimen: Anus; Stool  Result Value Ref Range Status    Campylobacter species NOT DETECTED NOT DETECTED Final   Plesimonas shigelloides NOT DETECTED NOT DETECTED Final   Salmonella species NOT DETECTED NOT DETECTED Final   Yersinia enterocolitica NOT DETECTED NOT DETECTED Final   Vibrio species NOT DETECTED NOT DETECTED Final   Vibrio cholerae NOT DETECTED NOT DETECTED Final   Enteroaggregative E coli (EAEC) NOT DETECTED NOT DETECTED Final   Enteropathogenic E coli (EPEC) NOT DETECTED NOT DETECTED Final   Enterotoxigenic E coli (ETEC) NOT DETECTED NOT DETECTED Final   Shiga like toxin producing E coli (STEC) NOT DETECTED NOT DETECTED Final   Shigella/Enteroinvasive E coli (EIEC) NOT DETECTED NOT DETECTED Final   Cryptosporidium NOT DETECTED NOT DETECTED Final   Cyclospora cayetanensis NOT DETECTED NOT DETECTED Final   Entamoeba histolytica NOT DETECTED NOT DETECTED Final   Giardia lamblia NOT DETECTED NOT DETECTED Final   Adenovirus F40/41 NOT DETECTED NOT DETECTED Final   Astrovirus NOT DETECTED NOT DETECTED Final   Norovirus GI/GII NOT DETECTED NOT DETECTED Final   Rotavirus A NOT DETECTED NOT DETECTED Final   Sapovirus (I, II, IV, and V) NOT DETECTED NOT DETECTED Final    Comment: Performed at [facility], [address] [place], [place] [address]  C Difficile Quick Screen w PCR reflex     Status: None   Collection Time: 11/23/23 11:16 AM   Specimen: STOOL  Result Value Ref Range Status   C Diff antigen NEGATIVE NEGATIVE Final   C Diff toxin NEGATIVE NEGATIVE Final   C Diff interpretation No C. difficile detected.  Final    Comment: Performed at [facility], [address] [place], [place] [address]    Labs: CBC: Recent Labs  Lab 11/24/23 1047 11/25/23 [id number] 11/26/23 1014 11/26/23 1713  11/26/23 2222 11/27/23 0535 11/27/23 0955 11/27/23 1606 11/27/23 2145 11/28/23 0529 11/28/23 0948 11/28/23 2203 11/29/23 1313 11/30/23 0411  WBC 23.8*   < > 24.5* 25.7*  --  30.3*  --  30.4*  --  29.2*  --   --    --   --   NEUTROABS 19.0*  --   --   --   --   --   --   --   --   --   --   --   --   --   HGB 10.2*   < > 6.5* 8.1*   < > 8.9*   < > 9.6*   < > 7.9* 7.5* 7.1* 8.7* 9.4*  HCT 30.4*   < > 19.8* 23.8*   < > 26.8*   < > 28.4*   < > 23.6* 23.2* 21.6* 26.3* 28.1*  MCV 94.7   < > 97.1 91.9  --  94.0  --  93.1  --  92.9  --   --   --   --   PLT 875*   < > 589* 660*  --  644*  --  623*  --  585*  --   --   --   --    < > = values in this interval not displayed.   Basic Metabolic Panel: Recent Labs  Lab 11/24/23 0254 11/25/23 0816  NA 133* 137  K 3.8 4.2  CL 102 109  CO2 21* 24  GLUCOSE 124* 118*  BUN 15 15  CREATININE 0.68 0.73  CALCIUM 8.3* 8.4*  MG  --  2.2   Liver Function Tests: Recent Labs  Lab 11/24/23 0254  AST 28  ALT 27  ALKPHOS 97  BILITOT 0.5  PROT 6.6  ALBUMIN 2.5*    Discharge time spent: greater than 30 minutes.  Signed: Melvinia Stager, MD [facility] 11/30/2023

## 2023-11-30 NOTE — Plan of Care (Signed)

## 2023-11-30 NOTE — Discharge Instructions (Addendum)
 Vascular surgery dressing changes postop:  Removal of old dressing  Cover staples with Xeroform gauze then  Cover gauze with 4 x 4's then  Cover with ABD pads x 2 then  Wrap with Kerlix then  Wrap snugly with 6 inch Ace bandage.  Dressing changes need to be done daily until staple removal.

## 2023-11-30 NOTE — Progress Notes (Addendum)
 Physical Therapy Treatment Patient Details Name: Penny Chase MRN: [id number] DOB: [birth year] Today's Date: 11/30/2023   History of Present Illness Patient is a 53 year old female with acute limb ischemia s/p left BKA. PE in association with lower GI bleed s/p IVC filter.    PT Comments  Patient is agreeable to PT session. The patient progressed ambulation distance this session with rolling walker. Standing activity tolerance limited by fatigue. CGA provided for walking and for transfers with and without the rolling walker. Progressed wheelchair mobility today as well. Education provided on stair negotiation with recommendation to use non emergent transportation to get into the home as the safest option (patient declines). Discussed that she could benefit from rehabilitation, however she is adamant that she wants to go home with family support. Recommend HHPT along with intermittent physical assistance with mobility after this hospitalization if patient is unwilling to consider rehab stay. PT will continue to follow.    If plan is discharge home, recommend the following: A little help with walking and/or transfers;A little help with bathing/dressing/bathroom;Supervision due to cognitive status;Help with stairs or ramp for entrance;Assistance with cooking/housework   Can travel by private vehicle        Equipment Recommendations  None recommended by PT    Recommendations for Other Services       Precautions / Restrictions Precautions Precautions: Fall Recall of Precautions/Restrictions: Intact Restrictions Weight Bearing Restrictions Per Provider Order: Yes LLE Weight Bearing Per Provider Order: Non weight bearing     Mobility  Bed Mobility Overal bed mobility: Modified Independent                  Transfers Overall transfer level: Needs assistance Equipment used: Rolling walker (2 wheels), None Transfers: Sit to/from Stand, Bed to chair/wheelchair/BSC Sit to Stand:  Contact guard assist Stand pivot transfers: Contact guard assist         General transfer comment: stand pivot transfer performed from bed to bed side commode without device. with education, patient able to stand from bed side commode and wheelchair with CGA using rolling walker    Ambulation/Gait Ambulation/Gait assistance: +2 safety/equipment, Contact guard assist Gait Distance (Feet): 20 Feet Assistive device: Rolling walker (2 wheels)   Gait velocity: decreased     General Gait Details: wheelchair follow for safety. increased ambulation distance this session. patient performed hop steps without loss of balance. arm fatigue limited further walking at this time. encouraged patient to use her wheelchair for longer distances as needed   Stairs         General stair comments: reviewed several options for stairs at home, including hopping up, bumping up while sitting, wheelchair technique, and non emergent transportation. safest method would be non emergent transportation at this time. patient too fatigued from walking to safely attempt hop technique at this time. reinforced the need for physical assistance from caregivers for safety and fall Firefighter mobility: Yes Wheelchair propulsion: Both upper extremities Wheelchair parts: Supervision/cueing Distance: 160 Wheelchair Assistance Details (indicate cue type and reason): patient is able to navigate in tight spaces today with increased time and occasional cues for technique. no physical assistance required.   Tilt Bed    Modified Rankin (Stroke Patients Only)       Balance Overall balance assessment: Needs assistance Sitting-balance support: Feet supported Sitting balance-Leahy Scale: Good     Standing balance support: Bilateral upper extremity supported Standing balance-Leahy Scale: Fair Standing balance comment: using rolling walker  for support                             Communication Communication Communication: No apparent difficulties  Cognition Arousal: Alert Behavior During Therapy: WFL for tasks assessed/performed   PT - Cognitive impairments: No apparent impairments                         Following commands: Intact      Cueing Cueing Techniques: Verbal cues, Visual cues  Exercises      General Comments        Pertinent Vitals/Pain Pain Assessment Pain Assessment: 0-10 Pain Score: 6  Pain Location: residual limb Pain Descriptors / Indicators: Discomfort Pain Intervention(s): Limited activity within patient's tolerance, Monitored during session, Repositioned    Home Living                          Prior Function            PT Goals (current goals can now be found in the care plan section) Acute Rehab PT Goals Patient Stated Goal: to go home PT Goal Formulation: With patient Time For Goal Achievement: 12/13/23 Potential to Achieve Goals: Fair Progress towards PT goals: Progressing toward goals    Frequency    Min 3X/week      PT Plan      Co-evaluation              AM-PAC PT "6 Clicks" Mobility   Outcome Measure  Help needed turning from your back to your side while in a flat bed without using bedrails?: None Help needed moving from lying on your back to sitting on the side of a flat bed without using bedrails?: A Little Help needed moving to and from a bed to a chair (including a wheelchair)?: A Little Help needed standing up from a chair using your arms (e.g., wheelchair or bedside chair)?: A Little Help needed to walk in hospital room?: A Little Help needed climbing 3-5 steps with a railing? : A Lot 6 Click Score: 18    End of Session Equipment Utilized During Treatment: Gait belt Activity Tolerance: Patient tolerated treatment well Patient left:  (sitting up in wheelchair per patient request with L knee in extension with leg rest support) Nurse  Communication: Mobility status PT Visit Diagnosis: Difficulty in walking, not elsewhere classified (R26.2);Muscle weakness (generalized) (M62.81)     Time: 1010-1047 PT Time Calculation (min) (ACUTE ONLY): 37 min  Charges:    $Therapeutic Activity: 23-37 mins PT General Charges $$ ACUTE PT VISIT: 1 Visit                     Ozie Bo, PT, MPT    Penny Chase 11/30/2023, 11:25 AM

## 2023-11-30 NOTE — Progress Notes (Signed)
  Progress Note    11/30/2023 10:07 AM 2 Days Post-Op  Subjective:  Penny Chase is a 53 year old female now status postop day #2 from IVC filter placement and postop day 4 from amputation.. Patient is resting comfortably in bed this morning. Patient endorses that her right groin puncture site is sore but otherwise tolerable.  Patient also endorses some soreness to her left BKA.  Patient has no other concerns at this time. Patient is recovering as expected. No other complaints. Vitals are remained stable.    Vitals:   11/30/23 0848 11/30/23 0851  BP: 135/74   Pulse: 85   Resp: 16 18  Temp: 98.8 F (37.1 C)   SpO2: 99%    Physical Exam: Cardiac:  RRR, normal S1 and S2.  No rubs clicks or gallops noted. Lungs: Lungs clear throughout on auscultation.  No rales rhonchi or wheezing to noted.  Normal nonlabored breathing. Incisions: Left lower extremity with medial and lateral calf incisions closed with Dermabond glue.  Left groin incision with staples clean dry and intact.  Open to air. Extremities: Right lower extremity warm to touch with palpable pulses.  Left lower extremity with new BKA.  Dressing clean dry and intact. Abdomen: Positive bowel sounds throughout, soft, nontender nondistended. Neurologic: Third and oriented.  Cranial nerves completely intact.  CBC    Component Value Date/Time   WBC 29.2 (H) 11/28/2023 0529   RBC 2.54 (L) 11/28/2023 0529   HGB 9.4 (L) 11/30/2023 0411   HCT 28.1 (L) 11/30/2023 0411   PLT 585 (H) 11/28/2023 0529   MCV 92.9 11/28/2023 0529   MCH 31.1 11/28/2023 0529   MCHC 33.5 11/28/2023 0529   RDW 16.5 (H) 11/28/2023 0529   LYMPHSABS 2.0 11/24/2023 1047   MONOABS 1.5 (H) 11/24/2023 1047   EOSABS 0.6 (H) 11/24/2023 1047   BASOSABS 0.3 (H) 11/24/2023 1047    BMET    Component Value Date/Time   NA 137 11/25/2023 0816   K 4.2 11/25/2023 0816   CL 109 11/25/2023 0816   CO2 24 11/25/2023 0816   GLUCOSE 118 (H) 11/25/2023 0816   BUN 15  11/25/2023 0816   CREATININE 0.73 11/25/2023 0816   CALCIUM 8.4 (L) 11/25/2023 0816   GFRNONAA >60 11/25/2023 0816   GFRAA >60 05/31/2016 1152    INR    Component Value Date/Time   INR 1.1 11/23/2023 0949     Intake/Output Summary (Last 24 hours) at 11/30/2023 1007 Last data filed at 11/29/2023 2018 Gross per 24 hour  Intake 298 ml  Output 250 ml  Net 48 ml     Assessment/Plan:  53 y.o. female is s/p inferior vena cava filter placement and left below the knee amputation.  2 Days Post-Op   PLAN Vascular surgery has now completed first postoperative left below the knee amputation dressing change.  No complications to note.  Patient will need to change dressing daily until staples are removed.  Patient will need to return to vascular clinic in 3 weeks for staple removal.  Patient will need home health going home for wound assessment and dressing change help.  Patient endorses she has all the equipment she needs.  Follow-up will be set up in epic with dressing changes.  DVT prophylaxis: None at this time due to GI bleeding   Marcie [facility] 11/30/2023 10:07 AM

## 2023-11-30 NOTE — TOC Initial Note (Signed)
 Transition of Care [facility]) - Initial/Assessment Note    Patient Details  Name: Penny Chase MRN: [id number] Date of Birth: Jul 01, 1971  [facility] CM/SW Contact:    Alexandra Ice, RN Phone Number: 11/30/2023, 1:06 PM  Clinical Narrative:                 Met with patient and family at bedside to discuss discharge plan. She is agreeable to home health services. She has no preference with home health agency. Sent referral to [facility] they are able to accept for PT and OT services. Patient has DME at home, stated her husband is an amputee as well.   Expected Discharge Plan: Home w Home Health Services Barriers to Discharge: Continued Medical Work up   Patient Goals and CMS Choice   CMS Medicare.gov Compare Post Acute Care list provided to:: Patient Choice offered to / list presented to : Patient      Expected Discharge Plan and Services     Post Acute Care Choice: Home Health Living arrangements for the past 2 months: Single Family Home                           HH Arranged: PT, OT HH Agency: [facility] Date [facility] Agency Contacted: 11/30/23 Time HH Agency Contacted: 1305 Representative spoke with at [facility] Agency: Verdis Glade  Prior Living Arrangements/Services Living arrangements for the past 2 months: Single Family Home Lives with:: Spouse Patient language and need for interpreter reviewed:: No Do you feel safe going back to the place where you live?: Yes      Need for Family Participation in Patient Care: No (Comment) Care giver support system in place?: Yes (comment) Current home services: DME (tub bench, RW, WC, Quad Cane, BSC) Criminal Activity/Legal Involvement Pertinent to Current Situation/Hospitalization: No - Comment as needed  Activities of Daily Living   ADL Screening (condition at time of admission) Independently performs ADLs?: No Does the patient have a NEW difficulty with bathing/dressing/toileting/self-feeding that is  expected to last >3 days?: Yes (Initiates electronic notice to provider for possible OT consult) Does the patient have a NEW difficulty with getting in/out of bed, walking, or climbing stairs that is expected to last >3 days?: Yes (Initiates electronic notice to provider for possible PT consult) Does the patient have a NEW difficulty with communication that is expected to last >3 days?: No Is the patient deaf or have difficulty hearing?: No Does the patient have difficulty seeing, even when wearing glasses/contacts?: No Does the patient have difficulty concentrating, remembering, or making decisions?: No  Permission Sought/Granted                  Emotional Assessment Appearance:: Appears stated age     Orientation: : Oriented to Self, Oriented to Place, Oriented to  Time, Oriented to Situation   Psych Involvement: No (comment)  Admission diagnosis:  Bloody stools [K92.1] Limb ischemia [I99.8] Blister of left foot, initial encounter [S90.822A] Leukocytosis, unspecified type [D72.829] Gangrene of left foot (HCC) [I96] Patient Active Problem List   Diagnosis Date Noted   Multiple gastric ulcers 11/28/2023   Ischemic colitis (HCC) 11/28/2023   Bloody stools 11/26/2023   Rectal bleeding 11/26/2023   Acute blood loss anemia 11/26/2023   Blister of left foot 11/24/2023   Limb ischemia 11/23/2023   Gangrene of left foot (HCC) 11/23/2023   Tobacco abuse 11/23/2023   PE (pulmonary thromboembolism) (HCC) 11/23/2023  GI bleeding 11/23/2023   Substance abuse (HCC) 11/23/2023   Leukocytosis 11/23/2023   Bipolar affective disorder, current episode manic (HCC) 01/06/2021   Brief psychotic disorder (HCC) 01/03/2021   Family history of breast cancer    Carcinoma of upper-outer quadrant of right breast in female, estrogen receptor positive (HCC) 09/20/2018   Gastroesophageal reflux disease without esophagitis 01/04/2017   HTN (hypertension), benign 01/20/2014   PCP:  [facility] Pharmacy:   [facility] [address], [place] - [address] [place] [address] Phone: [phone] Fax: [phone]     Social Drivers of Health (SDOH) Social History: SDOH Screenings   Food Insecurity: Patient Unable To Answer (11/25/2023)  Housing: Patient Unable To Answer (11/25/2023)  Transportation Needs: Patient Unable To Answer (11/25/2023)  Utilities: Patient Unable To Answer (11/25/2023)  Alcohol Screen: Medium Risk (01/06/2021)  Financial Resource Strain: Low Risk  (11/15/2023)   Received from [facility]  Tobacco Use: High Risk (11/25/2023)   SDOH Interventions:     Readmission Risk Interventions     No data to display

## 2023-11-30 NOTE — Progress Notes (Signed)
 Patient ID: Penny Chase, female   DOB: 1971-07-29, 53 y.o.   MRN: [id number]     SURGICAL PROGRESS NOTE   Hospital Day(s): 6.   Interval History: Patient seen and examined, no acute events or new complaints overnight. Patient reports feeling well today.  She denies any abdominal pain.  She endorses she has not had any new bowel movements.  Her hemoglobin today is 9.4.  Yesterday hemoglobin 8.7.  As per patient note hemoglobin was given from 8.7-9.4.  She has received 1 unit of PRBC and labs 24 hours.  Vital signs in last 24 hours: [min-max] current  Temp:  [98.1 F (36.7 C)-98.9 F (37.2 C)] 98.8 F (37.1 C) (04/16 0848) Pulse Rate:  [79-96] 85 (04/16 0848) Resp:  [16-18] 18 (04/16 0936) BP: (121-149)/(60-88) 135/74 (04/16 0848) SpO2:  [97 %-100 %] 99 % (04/16 0848)     Height: 5\' 4"  (162.6 cm) Weight: 81.6 kg BMI (Calculated): 30.88   Physical Exam:  Constitutional: alert, cooperative and no distress  Respiratory: breathing non-labored at rest  Cardiovascular: regular rate and sinus rhythm  Gastrointestinal: soft, non-tender, and non-distended  Labs:     Latest Ref Rng & Units 11/30/2023    4:11 AM 11/29/2023    1:13 PM 11/28/2023   10:03 PM  CBC  Hemoglobin 12.0 - 15.0 g/dL 9.4  8.7  7.1   Hematocrit 36.0 - 46.0 % 28.1  26.3  21.6       Latest Ref Rng & Units 11/25/2023    8:16 AM 11/24/2023    2:54 AM 11/23/2023    9:49 AM  CMP  Glucose 70 - 99 mg/dL 952  841  324   BUN 6 - 20 mg/dL 15  15  14    Creatinine 0.44 - 1.00 mg/dL 4.01  0.27  2.53   Sodium 135 - 145 mmol/L 137  133  135   Potassium 3.5 - 5.1 mmol/L 4.2  3.8  3.9   Chloride 98 - 111 mmol/L 109  102  102   CO2 22 - 32 mmol/L 24  21  23    Calcium 8.9 - 10.3 mg/dL 8.4  8.3  8.7   Total Protein 6.5 - 8.1 g/dL  6.6  7.6   Total Bilirubin 0.0 - 1.2 mg/dL  0.5  0.3   Alkaline Phos 38 - 126 U/L  97  111   AST 15 - 41 U/L  28  38   ALT 0 - 44 U/L  27  33     Imaging studies: No new pertinent imaging  studies   Assessment/Plan:  53 y.o. female with ischemic colitis with bleeding, complicated by pertinent comorbidities including pulmonary embolism, DVT, acute limb ischemia s/p left BKA, smoker, Hypertension.   - Patient continue clinically stable.  Stable vital signs, normal blood pressure. - There is no sign of active bleeding, no bowel movements - For hemoglobin continue adequate trending up after PRBC transfusion - This is a good sign that bleeding has been controlled - No contraindication to advance diet and try anticoagulation as per primary team and vascular team - Surgery will continue to follow while patient is inpatient in case of sign of rebleeding.  Lucila Rye, MD

## 2023-11-30 NOTE — Progress Notes (Unsigned)
 {  Select_TRH_Note:[id number]}

## 2023-11-30 NOTE — Telephone Encounter (Signed)
 Per chat- Please have the patient follow up with me in 2 to 3 weeks- MD labs CBCBMP possible- 15 minute follow up. Thank you.   Appointment

## 2023-12-01 ENCOUNTER — Other Ambulatory Visit: Payer: Self-pay | Admitting: Internal Medicine

## 2023-12-01 DIAGNOSIS — D649 Anemia, unspecified: Secondary | ICD-10-CM | POA: Insufficient documentation

## 2023-12-01 LAB — CARDIOLIPIN ANTIBODIES, IGG, IGM, IGA
Anticardiolipin IgA: <9 U/mL (ref 0–11)
Anticardiolipin IgG: 9 GPL U/mL (ref 0–14)
Anticardiolipin IgM: 10 [MPL'U]/mL (ref 0–12)

## 2023-12-01 LAB — LUPUS ANTICOAGULANT PANEL
DRVVT: 33.2 s (ref 0.0–47.0)
PTT Lupus Anticoagulant: 28.6 s (ref 0.0–43.5)

## 2023-12-01 LAB — PROTEIN C ACTIVITY: Protein C Activity: 160 % (ref 73–180)

## 2023-12-01 LAB — HOMOCYSTEINE: Homocysteine: 11.7 umol/L (ref 0.0–14.5)

## 2023-12-01 LAB — BETA-2-GLYCOPROTEIN I ABS, IGG/M/A
Beta-2 Glyco I IgG: <9 GPI IgG units (ref 0–20)
Beta-2-Glycoprotein I IgA: <9 GPI IgA units (ref 0–25)
Beta-2-Glycoprotein I IgM: <9 GPI IgM units (ref 0–32)

## 2023-12-01 LAB — PROTEIN S, TOTAL: Protein S Ag, Total: 87 % (ref 60–150)

## 2023-12-01 LAB — PROTEIN S ACTIVITY: Protein S Activity: 46 % — ABNORMAL LOW (ref 63–140)

## 2023-12-01 NOTE — Progress Notes (Signed)
 Please add Iron infusion at next visit. GB

## 2023-12-02 LAB — PROTEIN C, TOTAL: Protein C, Total: 112 % (ref 60–150)

## 2023-12-05 LAB — FACTOR 5 LEIDEN

## 2023-12-05 LAB — PROTHROMBIN GENE MUTATION

## 2023-12-08 ENCOUNTER — Other Ambulatory Visit (INDEPENDENT_AMBULATORY_CARE_PROVIDER_SITE_OTHER): Payer: Self-pay | Admitting: Vascular Surgery

## 2023-12-08 ENCOUNTER — Other Ambulatory Visit (INDEPENDENT_AMBULATORY_CARE_PROVIDER_SITE_OTHER): Payer: Self-pay | Admitting: Nurse Practitioner

## 2023-12-08 ENCOUNTER — Telehealth (INDEPENDENT_AMBULATORY_CARE_PROVIDER_SITE_OTHER): Payer: Self-pay

## 2023-12-08 MED ORDER — OXYCODONE HCL 5 MG PO TABS: 5.0000 mg | ORAL_TABLET | Freq: Four times a day (QID) | ORAL | 0 refills | Status: DC | PRN

## 2023-12-08 NOTE — Telephone Encounter (Signed)
 Patient has been notified

## 2023-12-08 NOTE — Progress Notes (Signed)
 Patient post op amputation OK to refill her narcotics at this point  she will f/u in office next week

## 2023-12-08 NOTE — Telephone Encounter (Signed)
 Patient left a message requesting for pain medication for pain relief. Patient had a left AKA on 11/26/23. Please Advise

## 2023-12-08 NOTE — Telephone Encounter (Signed)
 Refill Sent.

## 2023-12-13 ENCOUNTER — Telehealth (INDEPENDENT_AMBULATORY_CARE_PROVIDER_SITE_OTHER): Payer: Self-pay

## 2023-12-13 NOTE — Telephone Encounter (Signed)
 She had ischemic colitis and follow with GI.  As it regards to the stomach cramps, I would reach out to GI for concerns.  As far as the phantom limb pains, and some of the tenderness she is having, much of this is not uncommon.  At this time we can only provide supportive therapy, is she currently taking her meds such as her oxy, gabapentin  and celexa ?

## 2023-12-13 NOTE — Telephone Encounter (Signed)
 Penny Chase from [facility] called stating the patient is complaining of phantom pains of pins and needles, stomach cramps, muscle spasms and sensitivity to touch and numbness in the left groin. Patient had a BKA on 11/26/23 and LLE angio on 11/24/23. Please advise.

## 2023-12-14 NOTE — Telephone Encounter (Signed)
 I reached out to the patient and gave the recommendations from Sharla Davis NP and per the patient she is taking her meds are prescribed and will contact her GI provider. See notes below.

## 2023-12-16 ENCOUNTER — Inpatient Hospital Stay

## 2023-12-16 ENCOUNTER — Inpatient Hospital Stay: Attending: Internal Medicine

## 2023-12-16 ENCOUNTER — Encounter: Payer: Self-pay | Admitting: Internal Medicine

## 2023-12-16 ENCOUNTER — Inpatient Hospital Stay (HOSPITAL_BASED_OUTPATIENT_CLINIC_OR_DEPARTMENT_OTHER): Admitting: Internal Medicine

## 2023-12-16 VITALS — BP 108/54 | HR 70

## 2023-12-16 VITALS — BP 97/61 | HR 71 | Temp 98.9°F | Resp 16 | Ht 64.0 in | Wt 178.0 lb

## 2023-12-16 DIAGNOSIS — Z170 Estrogen receptor positive status [ER+]: Secondary | ICD-10-CM

## 2023-12-16 DIAGNOSIS — I2699 Other pulmonary embolism without acute cor pulmonale: Secondary | ICD-10-CM

## 2023-12-16 DIAGNOSIS — D649 Anemia, unspecified: Secondary | ICD-10-CM

## 2023-12-16 DIAGNOSIS — C50411 Malignant neoplasm of upper-outer quadrant of right female breast: Secondary | ICD-10-CM | POA: Diagnosis not present

## 2023-12-16 DIAGNOSIS — D500 Iron deficiency anemia secondary to blood loss (chronic): Secondary | ICD-10-CM | POA: Insufficient documentation

## 2023-12-16 DIAGNOSIS — Z79899 Other long term (current) drug therapy: Secondary | ICD-10-CM | POA: Insufficient documentation

## 2023-12-16 LAB — CBC WITH DIFFERENTIAL (CANCER CENTER ONLY)
Abs Immature Granulocytes: 0.17 10*3/uL — ABNORMAL HIGH (ref 0.00–0.07)
Basophils Absolute: 0.1 10*3/uL (ref 0.0–0.1)
Basophils Relative: 1 %
Eosinophils Absolute: 0.9 10*3/uL — ABNORMAL HIGH (ref 0.0–0.5)
Eosinophils Relative: 6 %
HCT: 25.6 % — ABNORMAL LOW (ref 36.0–46.0)
Hemoglobin: 8 g/dL — ABNORMAL LOW (ref 12.0–15.0)
Immature Granulocytes: 1 %
Lymphocytes Relative: 14 %
Lymphs Abs: 2.2 10*3/uL (ref 0.7–4.0)
MCH: 28.4 pg (ref 26.0–34.0)
MCHC: 31.3 g/dL (ref 30.0–36.0)
MCV: 90.8 fL (ref 80.0–100.0)
Monocytes Absolute: 0.7 10*3/uL (ref 0.1–1.0)
Monocytes Relative: 4 %
Neutro Abs: 12 10*3/uL — ABNORMAL HIGH (ref 1.7–7.7)
Neutrophils Relative %: 74 %
Platelet Count: 558 10*3/uL — ABNORMAL HIGH (ref 150–400)
RBC: 2.82 MIL/uL — ABNORMAL LOW (ref 3.87–5.11)
RDW: 16 % — ABNORMAL HIGH (ref 11.5–15.5)
WBC Count: 16 10*3/uL — ABNORMAL HIGH (ref 4.0–10.5)
nRBC: 0 % (ref 0.0–0.2)

## 2023-12-16 LAB — BASIC METABOLIC PANEL WITH GFR
Anion gap: 7 (ref 5–15)
BUN: 16 mg/dL (ref 6–20)
CO2: 25 mmol/L (ref 22–32)
Calcium: 8 mg/dL — ABNORMAL LOW (ref 8.9–10.3)
Chloride: 104 mmol/L (ref 98–111)
Creatinine, Ser: 0.76 mg/dL (ref 0.44–1.00)
GFR, Estimated: >60 mL/min (ref >60)
Glucose, Bld: 96 mg/dL (ref 70–99)
Potassium: 4.2 mmol/L (ref 3.5–5.1)
Sodium: 136 mmol/L (ref 135–145)

## 2023-12-16 MED ORDER — SODIUM CHLORIDE 0.9% FLUSH
10.0000 mL | Freq: Once | INTRAVENOUS | Status: AC | PRN
Start: 2023-12-16 — End: 2023-12-16
  Administered 2023-12-16: 10 mL
  Filled 2023-12-16: qty 10

## 2023-12-16 MED ORDER — IRON SUCROSE 20 MG/ML IV SOLN
200.0000 mg | Freq: Once | INTRAVENOUS | Status: AC
  Administered 2023-12-16: 200 mg via INTRAVENOUS

## 2023-12-16 NOTE — Progress Notes (Signed)
 [name] CONSULT NOTE  Patient Care Team: Center, [facility] as PCP - General (General Practice) [name], MD as Consulting Physician (Psychiatry) [name], MD as Consulting Physician (Oncology)  CHIEF COMPLAINTS/PURPOSE OF CONSULTATION:  Breast cancer/anemia/ DVT-PE  #  Oncology History Overview Note  # JAN 2020- INVASIVE LOBULAR CARCINOMA, WITH FEATURES. pT2 (44mm)psN0; (Dr.Cintron ) Stage IA; ER/PR >90%; her-2 neu-NEG.  #  RT finish May 30th; oncotype RS score- 11; 3% DR at 8 years; No adjuvant chemo  # June 1st 2020- Start Tam [pre-menopausal]  # HTN/ Covid positive [April 2020]  #S/p genetic counseling-declined testing [low risk/no children]   DIAGNOSIS: Right breast cancer  STAGE:     1    ;GOALS: Cure  CURRENT/MOST RECENT THERAPY: on TAM      Carcinoma of upper-outer quadrant of right breast in female, estrogen receptor positive (HCC)  09/20/2018 Initial Diagnosis   Carcinoma of upper-outer quadrant of right breast in female, estrogen receptor positive (HCC)      HISTORY OF PRESENTING ILLNESS: Patient ambulating- with assistance-name wheelchair.  Alone/Accompanied by family.   Penny Chase 53 y.o.  female  Stage I ER/PR pos her 2 Neg Breast cancer -will discontinue tamoxifen  for psychiatric reasons in 2022 is here for a follow up.  Recently evaluated in the [facility] system March 29 through April 7 with issues including multiple subsegmental PEs, lower extremity arterial occlusion status post thrombectomy.  Patient on Eliquis. However patient was admitted to hospital for bloody stools black-colored stools diagnosed with ischemic colitis.  Patient needing PRBC transfusion.  Complaints of significant joint pain/muscle pain.  Review of Systems  Constitutional:  Negative for chills, diaphoresis, fever, malaise/fatigue and weight loss.  HENT:  Negative for nosebleeds and sore throat.   Eyes:  Negative for double vision.   Respiratory:  Negative for cough, hemoptysis, sputum production, shortness of breath and wheezing.   Cardiovascular:  Negative for chest pain, palpitations, orthopnea and leg swelling.  Gastrointestinal:  Negative for abdominal pain, blood in stool, constipation, diarrhea, heartburn, melena, nausea and vomiting.  Musculoskeletal:  Positive for back pain, joint pain and myalgias.  Skin: Negative.  Negative for itching and rash.  Neurological:  Negative for dizziness, tingling, focal weakness, weakness and headaches.  Endo/Heme/Allergies:  Does not bruise/bleed easily.  Psychiatric/Behavioral:  Positive for depression. The patient is nervous/anxious and has insomnia.      MEDICAL HISTORY:  Past Medical History:  Diagnosis Date   Breast cancer (HCC)    Cancer (HCC) 09/2018   Right breast   COVID-19 11/2018   Family history of breast cancer    Hypertension    Personal history of radiation therapy 2020   Rt breast    SURGICAL HISTORY: Past Surgical History:  Procedure Laterality Date   AMPUTATION Left 11/26/2023   Procedure: AMPUTATION BELOW KNEE;  Surgeon: Jackquelyn Mass, MD;  Location: [facility] ORS;  Service: Vascular;  Laterality: Left;   BREAST BIOPSY Right 09/12/2018   us  core/"venus clip"-INVASIVE MAMMARYLobular CARCINOMA, WITH LOBULAR FEATURES   BREAST LUMPECTOMY Right 10/04/2018   BREAST LUMPECTOMY WITH NEEDLE LOCALIZATION AND AXILLARY SENTINEL LYMPH NODE BX Right 10/04/2018   Procedure: BREAST LUMPECTOMY WITH NEEDLE LOCALIZATION AND AXILLARY SENTINEL LYMPH NODE BX;  Surgeon: Eldred Grego, MD;  Location: [facility] ORS;  Service: General;  Laterality: Right;   COLONOSCOPY N/A 11/28/2023   Procedure: COLONOSCOPY;  Surgeon: Marnee Sink, MD;  Location: [facility] ENDOSCOPY;  Service: Endoscopy;  Laterality: N/A;   ESOPHAGOGASTRODUODENOSCOPY  N/A 11/28/2023   Procedure: EGD (ESOPHAGOGASTRODUODENOSCOPY);  Surgeon: Marnee Sink, MD;  Location[facility] ENDOSCOPY;  Service: Endoscopy;   Laterality: N/A;   GIVENS CAPSULE STUDY N/A 11/28/2023   Procedure: IMAGING PROCEDURE, GI TRACT, INTRALUMINAL, VIA CAPSULE;  Surgeon: Marnee Sink, MD;  Location: [facility] ENDOSCOPY;  Service: Endoscopy;  Laterality: N/A;   IVC FILTER INSERTION N/A 11/28/2023   Procedure: IVC FILTER INSERTION;  Surgeon: Jackquelyn Mass, MD;  Location: [facility] INVASIVE CV LAB;  Service: Cardiovascular;  Laterality: N/A;   LOWER EXTREMITY ANGIOGRAPHY Left 11/24/2023   Procedure: Lower Extremity Angiography;  Surgeon: Jackquelyn Mass, MD;  Location: [facility] INVASIVE CV LAB;  Service: Cardiovascular;  Laterality: Left;   TONSILLECTOMY      SOCIAL HISTORY: Social History   Socioeconomic History   Marital status: Married    Spouse name: Not on file   Number of children: Not on file   Years of education: Not on file   Highest education level: Not on file  Occupational History   Not on file  Tobacco Use   Smoking status: Every Day    Current packs/day: 1.00    Average packs/day: 1 pack/day for 15.0 years (15.0 ttl pk-yrs)    Types: Cigarettes   Smokeless tobacco: Never   Tobacco comments:    patient declined  Vaping Use   Vaping status: Former  Substance and Sexual Activity   Alcohol use: Not Currently    Comment: not taken for about 6 weeks   Drug use: Not Currently    Types: Marijuana    Comment: Uses street opioids   Sexual activity: Not Currently  Other Topics Concern   Not on file  Social History Narrative   Works at [employer];  Smoke 15 cig/day; No alcohol abuse; in [employer]; with husband.    Social Drivers of Corporate investment banker Strain: Low Risk  (11/15/2023)   Received from [facility]   Overall Financial Resource Strain (CARDIA)    Difficulty of Paying Living Expenses: Not very hard  Food Insecurity: Patient Unable To Answer (11/25/2023)   Hunger Vital Sign    Worried About Running Out of Food in the Last Year: Patient unable to answer    Ran Out of Food in  the Last Year: Patient unable to answer  Transportation Needs: Patient Unable To Answer (11/25/2023)   PRAPARE - Transportation    Lack of Transportation (Medical): Patient unable to answer    Lack of Transportation (Non-Medical): Patient unable to answer  Physical Activity: Not on file  Stress: Not on file  Social Connections: Not on file  Intimate Partner Violence: Patient Unable To Answer (11/25/2023)   Humiliation, Afraid, Rape, and Kick questionnaire    Fear of Current or Ex-Partner: Patient unable to answer    Emotionally Abused: Patient unable to answer    Physically Abused: Patient unable to answer    Sexually Abused: Patient unable to answer    FAMILY HISTORY: Family History  Problem Relation Age of Onset   Diabetes Mother    Atrial fibrillation Father    Breast cancer Paternal Aunt     ALLERGIES:  is allergic to amlodipine .  MEDICATIONS:  Current Outpatient Medications  Medication Sig Dispense Refill   acetaminophen  (TYLENOL ) 500 MG tablet Take 500 mg by mouth every 6 (six) hours as needed for moderate pain (pain score 4-6).     aspirin  81 MG chewable tablet Chew 81 mg by mouth daily.     celecoxib (  CELEBREX) 100 MG capsule Take 100 mg by mouth 2 (two) times daily.     citalopram  (CELEXA ) 20 MG tablet Take 20 mg by mouth daily.     cyanocobalamin  1000 MCG tablet Take 1 tablet (1,000 mcg total) by mouth daily. 30 tablet 2   folic acid  (FOLVITE ) 1 MG tablet Take 1 tablet (1 mg total) by mouth daily. 30 tablet 0   gabapentin  (NEURONTIN ) 600 MG tablet Take 1 tablet (600 mg total) by mouth 3 (three) times daily. 90 tablet 0   iron  polysaccharides (NIFEREX) 150 MG capsule Take 1 capsule (150 mg total) by mouth daily. 30 capsule 0   oxyCODONE  (OXY IR/ROXICODONE ) 5 MG immediate release tablet Take 1 tablet (5 mg total) by mouth every 6 (six) hours as needed for severe pain (pain score 7-10). 46 tablet 0   pantoprazole  (PROTONIX ) 40 MG tablet Take 1 tablet (40 mg total) by mouth  2 (two) times daily before a meal. 60 tablet 1   pravastatin  (PRAVACHOL ) 10 MG tablet Take 1 tablet by mouth daily.     sucralfate  (CARAFATE ) 1 GM/10ML suspension Take 10 mLs (1 g total) by mouth 4 (four) times daily -  with meals and at bedtime. 420 mL 0   No current facility-administered medications for this visit.      Aaron Aas  PHYSICAL EXAMINATION: ECOG PERFORMANCE STATUS: 0 - Asymptomatic  Vitals:   12/16/23 1319  BP: 97/61  Pulse: 71  Resp: 16  Temp: 98.9 F (37.2 C)  SpO2: 95%   Filed Weights   12/16/23 1319  Weight: 178 lb (80.7 kg)   Left lower extremity BKA  Physical Exam HENT:     Head: Normocephalic and atraumatic.     Mouth/Throat:     Pharynx: No oropharyngeal exudate.  Eyes:     Pupils: Pupils are equal, round, and reactive to light.  Cardiovascular:     Rate and Rhythm: Normal rate and regular rhythm.  Pulmonary:     Effort: Pulmonary effort is normal. No respiratory distress.     Breath sounds: Normal breath sounds. No wheezing.  Abdominal:     General: Bowel sounds are normal. There is no distension.     Palpations: Abdomen is soft. There is no mass.     Tenderness: There is no abdominal tenderness. There is no guarding or rebound.  Musculoskeletal:        General: No tenderness. Normal range of motion.     Cervical back: Normal range of motion and neck supple.  Skin:    General: Skin is warm.  Neurological:     Mental Status: She is alert and oriented to person, place, and time.  Psychiatric:        Mood and Affect: Affect normal.     Comments: Anxious/tearful      LABORATORY DATA:  I have reviewed the data as listed Lab Results  Component Value Date   WBC 16.0 (H) 12/16/2023   HGB 8.0 (L) 12/16/2023   HCT 25.6 (L) 12/16/2023   MCV 90.8 12/16/2023   PLT 558 (H) 12/16/2023   Recent Labs    11/23/23 0949 11/24/23 0254 11/25/23 0816 12/16/23 1320  NA 135 133* 137 136  K 3.9 3.8 4.2 4.2  CL 102 102 109 104  CO2 23 21* 24 25   GLUCOSE 112* 124* 118* 96  BUN 14 15 15 16   CREATININE 0.78 0.68 0.73 0.76  CALCIUM  8.7* 8.3* 8.4* 8.0*  GFRNONAA >60 >60 >60 >60  PROT 7.6 6.6  --   --   ALBUMIN 2.9* 2.5*  --   --   AST 38 28  --   --   ALT 33 27  --   --   ALKPHOS 111 97  --   --   BILITOT 0.3 0.5  --   --     RADIOGRAPHIC STUDIES: I have personally reviewed the radiological images as listed and agreed with the findings in the report. PERIPHERAL VASCULAR CATHETERIZATION Result Date: 11/28/2023 See surgical note for result.  NM GI Blood Loss Result Date: 11/27/2023 CLINICAL DATA:  Gastrointestinal hemorrhage. EXAM: NUCLEAR MEDICINE GASTROINTESTINAL BLEEDING SCAN TECHNIQUE: Sequential abdominal images were obtained following intravenous administration of Tc-72m labeled red blood cells. RADIOPHARMACEUTICALS:  23.72 mCi Tc-30m pertechnetate in-vitro labeled red cells. COMPARISON:  None Available. FINDINGS: There is expected uptake of radiotracer within the intravascular space, liver, and spleen. Expected gradual accumulation within bladder. No evidence of active gastrointestinal hemorrhage. IMPRESSION: No active gastrointestinal hemorrhage. Electronically Signed   By: Worthy Heads M.D.   On: 11/27/2023 21:23   US  OR NERVE BLOCK-IMAGE ONLY [facility]) Result Date: 11/26/2023 There is no interpretation for this exam.  This order is for images obtained during a surgical procedure.  Please See "Surgeries" Tab for more information regarding the procedure.   PERIPHERAL VASCULAR CATHETERIZATION Result Date: 11/24/2023 See surgical note for result.  CT Angio Aortobifemoral W and/or Wo Contrast Result Date: 11/23/2023 CLINICAL DATA:  53 year old female with a history of Recen arterial thrombus left lower extremity, now with blisters to left foot. Recently started anticoagulation, now with bloody diarrhea. EXAM: CT ANGIOGRAPHY OF ABDOMINAL AORTA WITH ILIOFEMORAL RUNOFF TECHNIQUE: Multidetector CT imaging of the abdomen, pelvis and  lower extremities was performed using the standard protocol during bolus administration of intravenous contrast. Multiplanar CT image reconstructions and MIPs were obtained to evaluate the vascular anatomy. RADIATION DOSE REDUCTION: This exam was performed according to the departmental dose-optimization program which includes automated exposure control, adjustment of the mA and/or kV according to patient size and/or use of iterative reconstruction technique. CONTRAST:  OMNIPAQUE  IOHEXOL  350 MG/ML SOLN COMPARISON:  None Available. FINDINGS: VASCULAR VASCULAR Aorta: Unremarkable course, caliber, contour of the abdominal aorta. No dissection, aneurysm, or periaortic fluid. Moderate mixed calcified and atheromatous plaque without pedunculated plaque. Celiac: Patent, with no significant atherosclerotic changes. SMA: Patent, with no significant atherosclerotic changes. Renals: - Right: Single right renal artery. Mixed calcified and soft plaque at the origin with estimated 50% or less narrowing. - Left: Single left renal artery. Mixed calcified and soft plaque at the origin without significant narrowing. IMA: Inferior mesenteric artery is patent. Right lower extremity: Mixed atheromatous and calcified plaque at the origin of the common iliac artery, with estimated 50% narrowing. Calcified plaque through the remainder of the CIA without narrowing. Hypogastric artery is patent with patency of the pelvic arteries. External iliac artery patent without significant atherosclerotic changes. Minimal atherosclerosis of the common femoral artery. Profunda femoris and the thigh branches are patent. SFA is patent without significant atherosclerotic changes. Popliteal artery patent. Typical trifurcation with patency of the proximal anterior tibial artery, the tibioperoneal trunk, proximal peroneal artery and proximal posterior tibial artery. The distal tibial arteries of the right lower extremity are limited in evaluation given  the timing of the contrast bolus. Despite this, at least anterior tibial artery and posterior tibial artery appear patent to the ankle. Left lower extremity: Mixed atheromatous and calcified plaque at the origin the left common iliac artery.  No high-grade stenosis. Calcifications through the length of the common iliac artery which is patent. Hypogastric artery patent.  Pelvic arteries patent. External iliac artery patent without significant atherosclerotic changes. No significant atherosclerotic changes of the common femoral artery. Profunda femoris and the thigh branches patent. Superficial femoral artery is patent throughout its length without significant atherosclerotic changes. Popliteal artery patent. Anterior tibial artery origin patent. Tibioperoneal trunk patent. The distal tibial arteries of the left lower extremity are limited given the timing of the contrast bolus. The CTA is nondiagnostic for patency of the tibial arteries in the mid and distal lower leg. Veins: Unremarkable appearance of the unopacified venous system. Review of the MIP images confirms the above findings. NON-VASCULAR Lower chest: Mix tree-in-bud and centrilobular nodularity in the bilateral lung bases. No comparison. No pleural effusion. No significant bronchial wall thickening or endobronchial debris. Hepatobiliary: Focal fatty infiltration adjacent to the falciform ligament. Unremarkable gall bladder. Pancreas: Unremarkable. Spleen: Unremarkable. Adrenals/Urinary Tract: - Right adrenal gland: Unremarkable - Left adrenal gland: Unremarkable. - Right kidney: No hydronephrosis, nephrolithiasis, inflammation, or ureteral dilation. No focal lesion. - Left Kidney: No hydronephrosis, nephrolithiasis, inflammation, or ureteral dilation. No focal lesion. - Urinary Bladder: Unremarkable. Stomach/Bowel: - Stomach: Unremarkable. - Small bowel: Unremarkable - Appendix: Normal. - Colon: The colon is fluid-filled from the hepatic flexure through the  transverse colon, splenic flexure, descending colon and sigmoid colon. Diverticula present within the sigmoid colon with some associated wall thickening and mild local inflammation of the fascia/mesentery. Lymphatic: No adenopathy. Mesenteric: No free fluid or air. No mesenteric adenopathy. Reproductive: Unremarkable appearance of the pelvic organs. Other: Surgical changes of the left inguinal region with some fatty stranding and small focal fluid. No evidence arterial pseudoaneurysm. No evidence of hematoma or abscess. Surgical staples present within the superficial soft tissues. Musculoskeletal: No evidence of acute fracture. No bony canal narrowing. No significant degenerative changes of the hips. IMPRESSION: CT angiogram demonstrates predominantly aorto iliac disease, including: -moderate irregular aortic atherosclerosis, with mixed atheromatous and calcified plaque. Aortic Atherosclerosis (ICD10-I70.0). -bilateral common iliac arterial disease with mixed calcified and atheromatous plaque. There is estimated 50% or less narrowing at the origin of the right CIA, with no significant narrowing of the left CIA. The CT is nondiagnostic for evaluation of the left-sided tibial arteries, and patency cannot be confirmed below the popliteal artery and trifurcation. Incidental subsegmental pulmonary embolism of the right lung base. Bilateral renal arterial disease, without high-grade stenosis. Fluid-filled colon, can be seen with nonspecific enteritis/colitis and correlation with symptoms may be useful. There is questionable early inflammatory changes in the wall of the sigmoid colon, at the site of the most pronounced diverticular disease, potentially representing early diverticulitis. Mixed tree-in-bud and centrilobular nodularity of the lung bases. This may be inflammatory/infectious or alternatively could be related to the patient's known diagnosis of PE/VTE. Additional ancillary findings as above. Signed, Marciano Settles.  Rexine Cater, RPVI [facility] Electronically Signed   By: Myrlene Asper D.O.   On: 11/23/2023 13:20   DG Foot Complete Left Result Date: 11/23/2023 CLINICAL DATA:  Recent arterial occlusion of left lower extremity. Complicated by apparent gangrene. New cluster development. Evaluate for underlying gas. Toes blistered and black. EXAM: LEFT FOOT - COMPLETE 3+ VIEW COMPARISON:  Left calcaneus radiographs 12/01/2020 FINDINGS: There is a moderate plantar calcaneal heel spur, unchanged. Minimal chronic enthesopathic change at the Achilles insertion on the calcaneus. Mild distal anterior tibial plafond and and adjacent talar dome-neck junction degenerative osteophytosis.  4 mm ossicle posterior to the talus on lateral view is unchanged from prior and chronic. Additional adjacent 2 mm ossicle not definitely previously seen. Mild posterior dorsal navicular degenerative spurring. Well corticated ossicle measuring up to 8 mm just distal to the fibula. No acute fracture dislocation. IMPRESSION: 1. Moderate plantar calcaneal heel spur, unchanged. 2. Mild distal anterior tibial plafond and adjacent talar dome-neck junction degenerative osteophytosis. 3. Well corticated, chronic ossicle measuring up to 8 mm just distal to the fibula, likely the sequela of remote trauma. Electronically Signed   By: Bertina Broccoli M.D.   On: 11/23/2023 10:59    ASSESSMENT & PLAN:   Carcinoma of upper-outer quadrant of right breast in female, estrogen receptor positive (HCC) # Stage IA ER PR positive HER-2/neu negative.;  Oncotype low risk - on Tamoxifen .  Stable mammogram Feb 2022- WNL; OFF Tamoxifen  since 2022.   # patient DISCONTINUED TAMOXIFEN -because of psychiatric complications/mood swings.  Recommend mammogram at next visit.   # Symptomatic anemia-secondary to recent GI bleed/history of colitis-hemoglobin today 8.  Proceed with iron  infusions.  # MARCH 2025- [facility]  multiple subsegmental PE-left lower extremity arterial ischemia -DVT-status post thrombectomy status post intervention at [facility]-currently status post BKA of the left lower extremity. Monitor closely.  Hypercoagulable workup-April 2025-unremarkable protein C slightly low expected from acute events. Currently off Eliquis.  At high risk of recurrent thrombotic event. ? IVC filter placement vs reinitiation of anticoagulation once bleeding is resolved.   # active smoker: Discussed with the patient regarding the ill effects of smoking- including but not limited to cardiac lung and vascular diseases and malignancies. Counseled against smoking; patient-not interested in quitting.   # DISPOSITION: # venofer- today # weekly venofer x 3 more # follow up in 4 weeks- APP- lab- cbc/bmp; possible venofer- Dr.B  # 40 minutes face-to-face with the patient discussing the above plan of care; more than 50% of time spent on prognosis/ natural history; counseling and coordination.   All questions were answered. The patient knows to call the clinic with any problems, questions or concerns.    Saralyn Willison R Ahmaya Ostermiller, MD 12/16/2023 4:30 PM

## 2023-12-16 NOTE — Assessment & Plan Note (Addendum)
#   Stage IA ER PR positive HER-2/neu negative.;  Oncotype low risk - on Tamoxifen .  Stable mammogram Feb 2022- WNL; OFF Tamoxifen  since 2022.   # patient DISCONTINUED TAMOXIFEN -because of psychiatric complications/mood swings.  Recommend mammogram at next visit.   # Symptomatic anemia-secondary to recent GI bleed/history of colitis-hemoglobin today 8.  Proceed with iron  infusions.  # MARCH 2025- [facility] multiple subsegmental PE-left lower extremity arterial ischemia -DVT-status post thrombectomy status post intervention at [facility]-currently status post BKA of the left lower extremity. Monitor closely.  Hypercoagulable workup-April 2025-unremarkable protein C slightly low expected from acute events. Currently off Eliquis.  At high risk of recurrent thrombotic event. ? IVC filter placement vs reinitiation of anticoagulation once bleeding is resolved.   # active smoker: Discussed with the patient regarding the ill effects of smoking- including but not limited to cardiac lung and vascular diseases and malignancies. Counseled against smoking; patient-not interested in quitting.   # DISPOSITION: # venofer- today # weekly venofer x 3 more # follow up in 4 weeks- APP- lab- cbc/bmp; possible venofer- Dr.B  # 40 minutes face-to-face with the patient discussing the above plan of care; more than 50% of time spent on prognosis/ natural history; counseling and coordination.

## 2023-12-16 NOTE — Progress Notes (Signed)
 Pt fell 2-3 weeks ago, hit her head, ct scan done, ok. BP and O2 had dropped, seen at [facility] and amputated lt foot at [facility].

## 2023-12-22 ENCOUNTER — Telehealth (INDEPENDENT_AMBULATORY_CARE_PROVIDER_SITE_OTHER): Payer: Self-pay

## 2023-12-22 ENCOUNTER — Ambulatory Visit (INDEPENDENT_AMBULATORY_CARE_PROVIDER_SITE_OTHER): Admitting: Nurse Practitioner

## 2023-12-22 VITALS — BP 88/58 | HR 73 | Resp 17

## 2023-12-22 DIAGNOSIS — Z89512 Acquired absence of left leg below knee: Secondary | ICD-10-CM

## 2023-12-22 MED ORDER — CYCLOBENZAPRINE HCL 5 MG PO TABS: 5.0000 mg | ORAL_TABLET | Freq: Three times a day (TID) | ORAL | 0 refills | Status: DC | PRN

## 2023-12-22 MED ORDER — DOXYCYCLINE HYCLATE 100 MG PO CAPS: 100.0000 mg | ORAL_CAPSULE | Freq: Two times a day (BID) | ORAL | 0 refills | Status: DC

## 2023-12-22 MED ORDER — OXYCODONE HCL 5 MG PO TABS: 5.0000 mg | ORAL_TABLET | Freq: Four times a day (QID) | ORAL | 0 refills | Status: DC | PRN

## 2023-12-22 NOTE — Telephone Encounter (Signed)
 I'll look at it when I see her

## 2023-12-23 ENCOUNTER — Inpatient Hospital Stay

## 2023-12-23 VITALS — BP 90/59 | HR 66 | Temp 97.3°F | Resp 18

## 2023-12-23 DIAGNOSIS — D500 Iron deficiency anemia secondary to blood loss (chronic): Secondary | ICD-10-CM | POA: Diagnosis not present

## 2023-12-23 DIAGNOSIS — D649 Anemia, unspecified: Secondary | ICD-10-CM

## 2023-12-23 MED ORDER — IRON SUCROSE 20 MG/ML IV SOLN
200.0000 mg | Freq: Once | INTRAVENOUS | Status: AC
  Administered 2023-12-23: 200 mg via INTRAVENOUS

## 2023-12-23 NOTE — Patient Instructions (Signed)

## 2023-12-26 ENCOUNTER — Encounter (INDEPENDENT_AMBULATORY_CARE_PROVIDER_SITE_OTHER): Payer: Self-pay | Admitting: Nurse Practitioner

## 2023-12-26 NOTE — Progress Notes (Signed)
 Subjective:    Patient ID: Penny Chase, female    DOB: 1971/07/19, 53 y.o.   MRN: [id number] Chief Complaint  Patient presents with   Venous Insufficiency    The patient returns today for follow-up of left below-knee amputation site.  She has been having some difficulty with pain and spasms.  Currently there is no dehiscence of the wound itself just some redness and discomfort.  She denies any fevers or chills.    Review of Systems  Skin:  Positive for wound.  All other systems reviewed and are negative.      Objective:    Physical Exam Vitals reviewed.  HENT:     Head: Normocephalic.  Cardiovascular:     Rate and Rhythm: Normal rate.  Pulmonary:     Effort: Pulmonary effort is normal.  Skin:    General: Skin is warm and dry.  Neurological:     Mental Status: She is alert and oriented to person, place, and time.  Psychiatric:        Mood and Affect: Mood normal.        Behavior: Behavior normal.        Thought Content: Thought content normal.        Judgment: Judgment normal.     BP (!) 88/58 (BP Location: Left Arm, Patient Position: Sitting, Cuff Size: Large)   Pulse 73   Resp 17   LMP 09/20/2018 (Exact Date)   Past Medical History:  Diagnosis Date   Breast cancer (HCC)    Cancer (HCC) 09/2018   Right breast   COVID-19 11/2018   Family history of breast cancer    Hypertension    Personal history of radiation therapy 2020   Rt breast    Social History   Socioeconomic History   Marital status: Married    Spouse name: Not on file   Number of children: Not on file   Years of education: Not on file   Highest education level: Not on file  Occupational History   Not on file  Tobacco Use   Smoking status: Every Day    Current packs/day: 1.00    Average packs/day: 1 pack/day for 15.0 years (15.0 ttl pk-yrs)    Types: Cigarettes   Smokeless tobacco: Never   Tobacco comments:    patient declined  Vaping Use   Vaping status: Former  Substance and  Sexual Activity   Alcohol use: Not Currently    Comment: not taken for about 6 weeks   Drug use: Not Currently    Types: Marijuana    Comment: Uses street opioids   Sexual activity: Not Currently  Other Topics Concern   Not on file  Social History Narrative   Works at [employer];  Smoke 15 cig/day; No alcohol abuse; in [employer]; with husband.    Social Drivers of Corporate investment banker Strain: Low Risk  (11/15/2023)   Received from [facility]   Overall Financial Resource Strain (CARDIA)    Difficulty of Paying Living Expenses: Not very hard  Food Insecurity: Patient Unable To Answer (11/25/2023)   Hunger Vital Sign    Worried About Running Out of Food in the Last Year: Patient unable to answer    Ran Out of Food in the Last Year: Patient unable to answer  Transportation Needs: Patient Unable To Answer (11/25/2023)   PRAPARE - Transportation    Lack of Transportation (Medical): Patient unable to answer    Lack  of Transportation (Non-Medical): Patient unable to answer  Physical Activity: Not on file  Stress: Not on file  Social Connections: Not on file  Intimate Partner Violence: Patient Unable To Answer (11/25/2023)   Humiliation, Afraid, Rape, and Kick questionnaire    Fear of Current or Ex-Partner: Patient unable to answer    Emotionally Abused: Patient unable to answer    Physically Abused: Patient unable to answer    Sexually Abused: Patient unable to answer    Past Surgical History:  Procedure Laterality Date   AMPUTATION Left 11/26/2023   Procedure: AMPUTATION BELOW KNEE;  Surgeon: Jackquelyn Mass, MD;  Location: [facility] ORS;  Service: Vascular;  Laterality: Left;   BREAST BIOPSY Right 09/12/2018   us  core/"venus clip"-INVASIVE MAMMARYLobular CARCINOMA, WITH LOBULAR FEATURES   BREAST LUMPECTOMY Right 10/04/2018   BREAST LUMPECTOMY WITH NEEDLE LOCALIZATION AND AXILLARY SENTINEL LYMPH NODE BX Right 10/04/2018   Procedure: BREAST LUMPECTOMY WITH  NEEDLE LOCALIZATION AND AXILLARY SENTINEL LYMPH NODE BX;  Surgeon: Eldred Grego, MD;  Location: [facility] ORS;  Service: General;  Laterality: Right;   COLONOSCOPY N/A 11/28/2023   Procedure: COLONOSCOPY;  Surgeon: Marnee Sink, MD;  Location: [facility] ENDOSCOPY;  Service: Endoscopy;  Laterality: N/A;   ESOPHAGOGASTRODUODENOSCOPY N/A 11/28/2023   Procedure: EGD (ESOPHAGOGASTRODUODENOSCOPY);  Surgeon: Marnee Sink, MD;  Location: [facility] ENDOSCOPY;  Service: Endoscopy;  Laterality: N/A;   GIVENS CAPSULE STUDY N/A 11/28/2023   Procedure: IMAGING PROCEDURE, GI TRACT, INTRALUMINAL, VIA CAPSULE;  Surgeon: Marnee Sink, MD;  Location: [facility] ENDOSCOPY;  Service: Endoscopy;  Laterality: N/A;   IVC FILTER INSERTION N/A 11/28/2023   Procedure: IVC FILTER INSERTION;  Surgeon: Jackquelyn Mass, MD;  Location: [facility] INVASIVE CV LAB;  Service: Cardiovascular;  Laterality: N/A;   LOWER EXTREMITY ANGIOGRAPHY Left 11/24/2023   Procedure: Lower Extremity Angiography;  Surgeon: Jackquelyn Mass, MD;  Location: [facility] INVASIVE CV LAB;  Service: Cardiovascular;  Laterality: Left;   TONSILLECTOMY      Family History  Problem Relation Age of Onset   Diabetes Mother    Atrial fibrillation Father    Breast cancer Paternal Aunt     Allergies  Allergen Reactions   Amlodipine  Swelling       Latest Ref Rng & Units 12/16/2023    1:20 PM 11/30/2023    4:11 AM 11/29/2023    1:13 PM  CBC  WBC 4.0 - 10.5 K/uL 16.0     Hemoglobin 12.0 - 15.0 g/dL 8.0  9.4  8.7   Hematocrit 36.0 - 46.0 % 25.6  28.1  26.3   Platelets 150 - 400 K/uL 558          CMP     Component Value Date/Time   NA 136 12/16/2023 1320   K 4.2 12/16/2023 1320   CL 104 12/16/2023 1320   CO2 25 12/16/2023 1320   GLUCOSE 96 12/16/2023 1320   BUN 16 12/16/2023 1320   CREATININE 0.76 12/16/2023 1320   CALCIUM  8.0 (L) 12/16/2023 1320   PROT 6.6 11/24/2023 0254   ALBUMIN 2.5 (L) 11/24/2023 0254   AST 28 11/24/2023 0254   ALT 27 11/24/2023 0254    ALKPHOS 97 11/24/2023 0254   BILITOT 0.5 11/24/2023 0254   GFRNONAA >60 12/16/2023 1320     No results found.     Assessment & Plan:   1. S/P BKA (below knee amputation), left (HCC) (Primary) Half of the patient's staples removed from her amputation site today.  She handled this fairly well.  There is some  redness around the area which has concern for possible infection so antibiotic sent in today as well.  Will give her some Flexeril  to see if this helps with some muscle spasms but I suspect this is also part of the adjustment process from the amputation site.  We have also refilled her pain medication.   Current Outpatient Medications on File Prior to Visit  Medication Sig Dispense Refill   celecoxib (CELEBREX) 100 MG capsule Take 100 mg by mouth 2 (two) times daily.     citalopram  (CELEXA ) 20 MG tablet Take 20 mg by mouth daily.     cyanocobalamin  1000 MCG tablet Take 1 tablet (1,000 mcg total) by mouth daily. 30 tablet 2   folic acid  (FOLVITE ) 1 MG tablet Take 1 tablet (1 mg total) by mouth daily. 30 tablet 0   gabapentin  (NEURONTIN ) 600 MG tablet Take 1 tablet (600 mg total) by mouth 3 (three) times daily. 90 tablet 0   iron  polysaccharides (NIFEREX) 150 MG capsule Take 1 capsule (150 mg total) by mouth daily. 30 capsule 0   pantoprazole  (PROTONIX ) 40 MG tablet Take 1 tablet (40 mg total) by mouth 2 (two) times daily before a meal. 60 tablet 1   pravastatin  (PRAVACHOL ) 10 MG tablet Take 1 tablet by mouth daily.     acetaminophen  (TYLENOL ) 500 MG tablet Take 500 mg by mouth every 6 (six) hours as needed for moderate pain (pain score 4-6). (Patient not taking: Reported on 12/22/2023)     sucralfate  (CARAFATE ) 1 GM/10ML suspension Take 10 mLs (1 g total) by mouth 4 (four) times daily -  with meals and at bedtime. (Patient not taking: Reported on 12/22/2023) 420 mL 0   No current facility-administered medications on file prior to visit.    There are no Patient Instructions on file for  this visit. No follow-ups on file.   Luther Newhouse E Nicolle Heward, NP

## 2023-12-30 ENCOUNTER — Inpatient Hospital Stay

## 2023-12-30 VITALS — BP 105/64 | HR 76 | Temp 97.2°F | Resp 18

## 2023-12-30 DIAGNOSIS — D500 Iron deficiency anemia secondary to blood loss (chronic): Secondary | ICD-10-CM | POA: Diagnosis not present

## 2023-12-30 DIAGNOSIS — D649 Anemia, unspecified: Secondary | ICD-10-CM

## 2023-12-30 MED ORDER — IRON SUCROSE 20 MG/ML IV SOLN
200.0000 mg | Freq: Once | INTRAVENOUS | Status: AC
  Administered 2023-12-30: 200 mg via INTRAVENOUS
  Filled 2023-12-30: qty 10

## 2024-01-02 ENCOUNTER — Ambulatory Visit (INDEPENDENT_AMBULATORY_CARE_PROVIDER_SITE_OTHER): Admitting: Nurse Practitioner

## 2024-01-02 ENCOUNTER — Encounter (INDEPENDENT_AMBULATORY_CARE_PROVIDER_SITE_OTHER): Payer: Self-pay | Admitting: Nurse Practitioner

## 2024-01-02 VITALS — BP 139/80 | HR 83 | Resp 18

## 2024-01-02 DIAGNOSIS — Z89512 Acquired absence of left leg below knee: Secondary | ICD-10-CM

## 2024-01-02 DIAGNOSIS — I700 Atherosclerosis of aorta: Secondary | ICD-10-CM

## 2024-01-02 DIAGNOSIS — I719 Aortic aneurysm of unspecified site, without rupture: Secondary | ICD-10-CM

## 2024-01-02 MED ORDER — OXYCODONE HCL 5 MG PO TABS: 5.0000 mg | ORAL_TABLET | Freq: Four times a day (QID) | ORAL | 0 refills | Status: AC | PRN

## 2024-01-03 ENCOUNTER — Encounter (INDEPENDENT_AMBULATORY_CARE_PROVIDER_SITE_OTHER): Payer: Self-pay

## 2024-01-03 NOTE — Progress Notes (Signed)
 Subjective:    Patient ID: Penny Chase, female    DOB: October 01, 1970, 53 y.o.   MRN: [id number] Chief Complaint  Patient presents with   Follow-up    fu 1-2 weeks staple removals    The patient follows up today for her left below-knee amputation site.  Since her recent visit she has slipped and fell and her leg is swollen.  Fortunately her incision site is intact with no dehiscence.  She does endorse having some pain and discomfort in the residual limb.    Review of Systems  Cardiovascular:  Positive for leg swelling.  Neurological:  Positive for weakness.  All other systems reviewed and are negative.      Objective:    Physical Exam Vitals reviewed.  HENT:     Head: Normocephalic.  Cardiovascular:     Rate and Rhythm: Normal rate.  Pulmonary:     Effort: Pulmonary effort is normal.  Musculoskeletal:        General: Tenderness present.     Left Lower Extremity: Left leg is amputated below knee.  Skin:    General: Skin is warm and dry.  Neurological:     Mental Status: She is alert and oriented to person, place, and time.     Gait: Gait abnormal.  Psychiatric:        Mood and Affect: Mood normal.        Behavior: Behavior normal.        Thought Content: Thought content normal.        Judgment: Judgment normal.     BP 139/80   Pulse 83   Resp 18   LMP 09/20/2018 (Exact Date)   Past Medical History:  Diagnosis Date   Breast cancer (HCC)    Cancer (HCC) 09/2018   Right breast   COVID-19 11/2018   Family history of breast cancer    Hypertension    Personal history of radiation therapy 2020   Rt breast    Social History   Socioeconomic History   Marital status: Married    Spouse name: Not on file   Number of children: Not on file   Years of education: Not on file   Highest education level: Not on file  Occupational History   Not on file  Tobacco Use   Smoking status: Every Day    Current packs/day: 1.00    Average packs/day: 1 pack/day for 15.0  years (15.0 ttl pk-yrs)    Types: Cigarettes   Smokeless tobacco: Never   Tobacco comments:    patient declined  Vaping Use   Vaping status: Former  Substance and Sexual Activity   Alcohol use: Not Currently    Comment: not taken for about 6 weeks   Drug use: Not Currently    Types: Marijuana    Comment: Uses street opioids   Sexual activity: Not Currently  Other Topics Concern   Not on file  Social History Narrative   Works at [employer];  Smoke 15 cig/day; No alcohol abuse; in [employer]; with husband.    Social Drivers of Corporate investment banker Strain: Low Risk  (11/15/2023)   Received from [facility]   Overall Financial Resource Strain (CARDIA)    Difficulty of Paying Living Expenses: Not very hard  Food Insecurity: Patient Unable To Answer (11/25/2023)   Hunger Vital Sign    Worried About Running Out of Food in the Last Year: Patient unable to answer  Ran Out of Food in the Last Year: Patient unable to answer  Transportation Needs: Patient Unable To Answer (11/25/2023)   PRAPARE - Transportation    Lack of Transportation (Medical): Patient unable to answer    Lack of Transportation (Non-Medical): Patient unable to answer  Physical Activity: Not on file  Stress: Not on file  Social Connections: Not on file  Intimate Partner Violence: Patient Unable To Answer (11/25/2023)   Humiliation, Afraid, Rape, and Kick questionnaire    Fear of Current or Ex-Partner: Patient unable to answer    Emotionally Abused: Patient unable to answer    Physically Abused: Patient unable to answer    Sexually Abused: Patient unable to answer    Past Surgical History:  Procedure Laterality Date   AMPUTATION Left 11/26/2023   Procedure: AMPUTATION BELOW KNEE;  Surgeon: Jackquelyn Mass, MD;  Location: [facility] ORS;  Service: Vascular;  Laterality: Left;   BREAST BIOPSY Right 09/12/2018   us  core/"venus clip"-INVASIVE MAMMARYLobular CARCINOMA, WITH LOBULAR FEATURES    BREAST LUMPECTOMY Right 10/04/2018   BREAST LUMPECTOMY WITH NEEDLE LOCALIZATION AND AXILLARY SENTINEL LYMPH NODE BX Right 10/04/2018   Procedure: BREAST LUMPECTOMY WITH NEEDLE LOCALIZATION AND AXILLARY SENTINEL LYMPH NODE BX;  Surgeon: Eldred Grego, MD;  Location: [facility] ORS;  Service: General;  Laterality: Right;   COLONOSCOPY N/A 11/28/2023   Procedure: COLONOSCOPY;  Surgeon: Marnee Sink, MD;  Location: [facility] ENDOSCOPY;  Service: Endoscopy;  Laterality: N/A;   ESOPHAGOGASTRODUODENOSCOPY N/A 11/28/2023   Procedure: EGD (ESOPHAGOGASTRODUODENOSCOPY);  Surgeon: Marnee Sink, MD;  Location: [facility] ENDOSCOPY;  Service: Endoscopy;  Laterality: N/A;   GIVENS CAPSULE STUDY N/A 11/28/2023   Procedure: IMAGING PROCEDURE, GI TRACT, INTRALUMINAL, VIA CAPSULE;  Surgeon: Marnee Sink, MD;  Location: [facility] ENDOSCOPY;  Service: Endoscopy;  Laterality: N/A;   IVC FILTER INSERTION N/A 11/28/2023   Procedure: IVC FILTER INSERTION;  Surgeon: Jackquelyn Mass, MD;  Location: [facility] INVASIVE CV LAB;  Service: Cardiovascular;  Laterality: N/A;   LOWER EXTREMITY ANGIOGRAPHY Left 11/24/2023   Procedure: Lower Extremity Angiography;  Surgeon: Jackquelyn Mass, MD;  Location: [facility] INVASIVE CV LAB;  Service: Cardiovascular;  Laterality: Left;   TONSILLECTOMY      Family History  Problem Relation Age of Onset   Diabetes Mother    Atrial fibrillation Father    Breast cancer Paternal Aunt     Allergies  Allergen Reactions   Amlodipine  Swelling       Latest Ref Rng & Units 12/16/2023    1:20 PM 11/30/2023    4:11 AM 11/29/2023    1:13 PM  CBC  WBC 4.0 - 10.5 K/uL 16.0     Hemoglobin 12.0 - 15.0 g/dL 8.0  9.4  8.7   Hematocrit 36.0 - 46.0 % 25.6  28.1  26.3   Platelets 150 - 400 K/uL 558          CMP     Component Value Date/Time   NA 136 12/16/2023 1320   K 4.2 12/16/2023 1320   CL 104 12/16/2023 1320   CO2 25 12/16/2023 1320   GLUCOSE 96 12/16/2023 1320   BUN 16 12/16/2023 1320   CREATININE 0.76  12/16/2023 1320   CALCIUM  8.0 (L) 12/16/2023 1320   PROT 6.6 11/24/2023 0254   ALBUMIN 2.5 (L) 11/24/2023 0254   AST 28 11/24/2023 0254   ALT 27 11/24/2023 0254   ALKPHOS 97 11/24/2023 0254   BILITOT 0.5 11/24/2023 0254   GFRNONAA >60 12/16/2023 1320     No results  found.     Assessment & Plan:   1. S/P BKA (below knee amputation), left (HCC) (Primary) The patient's amputation site is healing very well and further staples have been removed.  She has swelling which I suspect is trauma from her recent fall.  Will have her return in 3 weeks and have her wound is completely healed we will refer her to [facility].  2. Atherosclerotic ulcer of aorta (HCC) It was noted during the patient's hospitalization she had an aortic ulceration which is concerning for possible distal embolization as a cause for some of her ischemic episodes.  However her intervention was delayed during her hospitalization due to GI bleed.  She has not had any further recurrent episodes of GI bleed however we would not want to intervene until she is able to tolerate some sort of antiplatelet therapy.  She will hopefully follow with GI soon.  When she follows up we will discuss possible plan options.   Current Outpatient Medications on File Prior to Visit  Medication Sig Dispense Refill   acetaminophen  (TYLENOL ) 500 MG tablet Take 500 mg by mouth every 6 (six) hours as needed for moderate pain (pain score 4-6).     celecoxib (CELEBREX) 100 MG capsule Take 100 mg by mouth 2 (two) times daily.     citalopram  (CELEXA ) 20 MG tablet Take 20 mg by mouth daily.     cyanocobalamin  1000 MCG tablet Take 1 tablet (1,000 mcg total) by mouth daily. 30 tablet 2   cyclobenzaprine  (FLEXERIL ) 5 MG tablet Take 1 tablet (5 mg total) by mouth 3 (three) times daily as needed for muscle spasms. 30 tablet 0   doxycycline  (VIBRAMYCIN ) 100 MG capsule Take 1 capsule (100 mg total) by mouth 2 (two) times daily. 20 capsule 0   folic acid   (FOLVITE ) 1 MG tablet Take 1 tablet (1 mg total) by mouth daily. 30 tablet 0   gabapentin  (NEURONTIN ) 600 MG tablet Take 1 tablet (600 mg total) by mouth 3 (three) times daily. 90 tablet 0   iron  polysaccharides (NIFEREX) 150 MG capsule Take 1 capsule (150 mg total) by mouth daily. 30 capsule 0   pantoprazole  (PROTONIX ) 40 MG tablet Take 1 tablet (40 mg total) by mouth 2 (two) times daily before a meal. 60 tablet 1   pravastatin  (PRAVACHOL ) 10 MG tablet Take 1 tablet by mouth daily.     sucralfate  (CARAFATE ) 1 GM/10ML suspension Take 10 mLs (1 g total) by mouth 4 (four) times daily -  with meals and at bedtime. (Patient not taking: Reported on 01/02/2024) 420 mL 0   No current facility-administered medications on file prior to visit.    There are no Patient Instructions on file for this visit. No follow-ups on file.   Daved Mcfann E Master Touchet, NP

## 2024-01-04 ENCOUNTER — Inpatient Hospital Stay

## 2024-01-04 VITALS — BP 106/76 | HR 71 | Temp 99.2°F | Resp 18

## 2024-01-04 DIAGNOSIS — D500 Iron deficiency anemia secondary to blood loss (chronic): Secondary | ICD-10-CM | POA: Diagnosis not present

## 2024-01-04 DIAGNOSIS — D649 Anemia, unspecified: Secondary | ICD-10-CM

## 2024-01-04 MED ORDER — SODIUM CHLORIDE 0.9% FLUSH
10.0000 mL | Freq: Once | INTRAVENOUS | Status: AC | PRN
  Administered 2024-01-04: 10 mL
  Filled 2024-01-04: qty 10

## 2024-01-04 MED ORDER — IRON SUCROSE 20 MG/ML IV SOLN
200.0000 mg | Freq: Once | INTRAVENOUS | Status: AC
  Administered 2024-01-04: 200 mg via INTRAVENOUS

## 2024-01-06 ENCOUNTER — Inpatient Hospital Stay

## 2024-01-13 ENCOUNTER — Ambulatory Visit

## 2024-01-13 ENCOUNTER — Other Ambulatory Visit

## 2024-01-13 ENCOUNTER — Ambulatory Visit: Admitting: Nurse Practitioner

## 2024-01-17 ENCOUNTER — Inpatient Hospital Stay: Attending: Internal Medicine

## 2024-01-17 ENCOUNTER — Encounter: Payer: Self-pay | Admitting: Nurse Practitioner

## 2024-01-17 ENCOUNTER — Inpatient Hospital Stay

## 2024-01-17 ENCOUNTER — Inpatient Hospital Stay (HOSPITAL_BASED_OUTPATIENT_CLINIC_OR_DEPARTMENT_OTHER): Admitting: Nurse Practitioner

## 2024-01-17 ENCOUNTER — Telehealth (INDEPENDENT_AMBULATORY_CARE_PROVIDER_SITE_OTHER): Payer: Self-pay

## 2024-01-17 VITALS — BP 117/85 | HR 84 | Temp 98.5°F | Resp 16

## 2024-01-17 DIAGNOSIS — Z170 Estrogen receptor positive status [ER+]: Secondary | ICD-10-CM

## 2024-01-17 DIAGNOSIS — C50411 Malignant neoplasm of upper-outer quadrant of right female breast: Secondary | ICD-10-CM

## 2024-01-17 DIAGNOSIS — F17210 Nicotine dependence, cigarettes, uncomplicated: Secondary | ICD-10-CM | POA: Insufficient documentation

## 2024-01-17 DIAGNOSIS — Z853 Personal history of malignant neoplasm of breast: Secondary | ICD-10-CM | POA: Diagnosis present

## 2024-01-17 DIAGNOSIS — D508 Other iron deficiency anemias: Secondary | ICD-10-CM | POA: Insufficient documentation

## 2024-01-17 DIAGNOSIS — Z86711 Personal history of pulmonary embolism: Secondary | ICD-10-CM | POA: Diagnosis not present

## 2024-01-17 DIAGNOSIS — I2699 Other pulmonary embolism without acute cor pulmonale: Secondary | ICD-10-CM

## 2024-01-17 LAB — CBC WITH DIFFERENTIAL (CANCER CENTER ONLY)
Abs Immature Granulocytes: 0.07 10*3/uL (ref 0.00–0.07)
Basophils Absolute: 0.1 10*3/uL (ref 0.0–0.1)
Basophils Relative: 1 %
Eosinophils Absolute: 0.7 10*3/uL — ABNORMAL HIGH (ref 0.0–0.5)
Eosinophils Relative: 6 %
HCT: 41.3 % (ref 36.0–46.0)
Hemoglobin: 12.5 g/dL (ref 12.0–15.0)
Immature Granulocytes: 1 %
Lymphocytes Relative: 11 %
Lymphs Abs: 1.3 10*3/uL (ref 0.7–4.0)
MCH: 27.4 pg (ref 26.0–34.0)
MCHC: 30.3 g/dL (ref 30.0–36.0)
MCV: 90.6 fL (ref 80.0–100.0)
Monocytes Absolute: 0.7 10*3/uL (ref 0.1–1.0)
Monocytes Relative: 6 %
Neutro Abs: 9 10*3/uL — ABNORMAL HIGH (ref 1.7–7.7)
Neutrophils Relative %: 75 %
Platelet Count: 442 10*3/uL — ABNORMAL HIGH (ref 150–400)
RBC: 4.56 MIL/uL (ref 3.87–5.11)
RDW: 16.9 % — ABNORMAL HIGH (ref 11.5–15.5)
WBC Count: 11.8 10*3/uL — ABNORMAL HIGH (ref 4.0–10.5)
nRBC: 0 % (ref 0.0–0.2)

## 2024-01-17 LAB — BASIC METABOLIC PANEL WITH GFR
Anion gap: 8 (ref 5–15)
BUN: 14 mg/dL (ref 6–20)
CO2: 26 mmol/L (ref 22–32)
Calcium: 8.6 mg/dL — ABNORMAL LOW (ref 8.9–10.3)
Chloride: 103 mmol/L (ref 98–111)
Creatinine, Ser: 0.83 mg/dL (ref 0.44–1.00)
GFR, Estimated: >60 mL/min (ref >60)
Glucose, Bld: 95 mg/dL (ref 70–99)
Potassium: 4.1 mmol/L (ref 3.5–5.1)
Sodium: 137 mmol/L (ref 135–145)

## 2024-01-17 NOTE — Telephone Encounter (Signed)
 That is fine

## 2024-01-17 NOTE — Telephone Encounter (Signed)
 Spoke with Tosha and gave her verbal orders she can stop wound care for pt.

## 2024-01-17 NOTE — Telephone Encounter (Signed)
 Penny Chase from [facility] called wanting verbal orders to stop wound care for pt.  She states the pt is now just wrapping with an ace bandage.  I see she is scheduled for a wound check on the 9th of this month.  Please advise

## 2024-01-17 NOTE — Progress Notes (Unsigned)
 [facility] CONSULT NOTE  Patient Care Team: Center, [facility] as PCP - General (General Practice) [name], MD as Consulting Physician (Psychiatry) [name], MD as Consulting Physician (Oncology)  CHIEF COMPLAINTS/PURPOSE OF CONSULTATION:  Breast cancer/anemia/ DVT-PE  #  Oncology History Overview Note  # JAN 2020- INVASIVE LOBULAR CARCINOMA, WITH FEATURES. pT2 (51mm)psN0; (Dr.Cintron ) Stage IA; ER/PR >90%; her-2 neu-NEG.  #  RT finish May 30th; oncotype RS score- 11; 3% DR at 8 years; No adjuvant chemo  # June 1st 2020- Start Tam [pre-menopausal]  # HTN/ Covid positive [April 2020]  #S/p genetic counseling-declined testing [low risk/no children]   DIAGNOSIS: Right breast cancer  STAGE:     1    ;GOALS: Cure  CURRENT/MOST RECENT THERAPY: on TAM      Carcinoma of upper-outer quadrant of right breast in female, estrogen receptor positive (HCC)  09/20/2018 Initial Diagnosis   Carcinoma of upper-outer quadrant of right breast in female, estrogen receptor positive (HCC)     HISTORY OF PRESENTING ILLNESS: Accompanied by spouse. BKA. In wheelchair.   Penny Chase 53 y.o. female with history of stage I ER/PR positive, her2 negative breast cancer, off tamoxifen  since 2022 d/t side effects, who returns to clinic for follow up. She had a fall from wheelchair in interim that injured her post operative wound but now resolving and healing well. She is adjusting to life in a wheelchair and as an amputee. She reports ongoing compliance with eliquis. She continues to have aches and pains.  Review of Systems  Constitutional:  Negative for chills, diaphoresis, fever, malaise/fatigue and weight loss.  HENT:  Negative for nosebleeds and sore throat.   Eyes:  Negative for double vision.  Respiratory:  Negative for cough, hemoptysis, sputum production, shortness of breath and wheezing.   Cardiovascular:  Negative for chest pain, palpitations,  orthopnea and leg swelling.  Gastrointestinal:  Negative for abdominal pain, blood in stool, constipation, diarrhea, heartburn, melena, nausea and vomiting.  Musculoskeletal:  Positive for back pain, joint pain and myalgias.  Skin: Negative.  Negative for itching and rash.  Neurological:  Negative for dizziness, tingling, focal weakness, weakness and headaches.  Endo/Heme/Allergies:  Does not bruise/bleed easily.  Psychiatric/Behavioral:  Positive for depression. The patient is nervous/anxious and has insomnia.      MEDICAL HISTORY:  Past Medical History:  Diagnosis Date   Breast cancer (HCC)    Cancer (HCC) 09/2018   Right breast   COVID-19 11/2018   Family history of breast cancer    Hypertension    Personal history of radiation therapy 2020   Rt breast    SURGICAL HISTORY: Past Surgical History:  Procedure Laterality Date   AMPUTATION Left 11/26/2023   Procedure: AMPUTATION BELOW KNEE;  Surgeon: Jackquelyn Mass, MD;  Location: [facility] ORS;  Service: Vascular;  Laterality: Left;   BREAST BIOPSY Right 09/12/2018   us  core/"venus clip"-INVASIVE MAMMARYLobular CARCINOMA, WITH LOBULAR FEATURES   BREAST LUMPECTOMY Right 10/04/2018   BREAST LUMPECTOMY WITH NEEDLE LOCALIZATION AND AXILLARY SENTINEL LYMPH NODE BX Right 10/04/2018   Procedure: BREAST LUMPECTOMY WITH NEEDLE LOCALIZATION AND AXILLARY SENTINEL LYMPH NODE BX;  Surgeon: Eldred Grego, MD;  Location: [facility] ORS;  Service: General;  Laterality: Right;   COLONOSCOPY N/A 11/28/2023   Procedure: COLONOSCOPY;  Surgeon: Marnee Sink, MD;  Location: [facility] ENDOSCOPY;  Service: Endoscopy;  Laterality: N/A;   ESOPHAGOGASTRODUODENOSCOPY N/A 11/28/2023   Procedure: EGD (ESOPHAGOGASTRODUODENOSCOPY);  Surgeon: Marnee Sink, MD;  Location: [facility]  ENDOSCOPY;  Service: Endoscopy;  Laterality: N/A;   GIVENS CAPSULE STUDY N/A 11/28/2023   Procedure: IMAGING PROCEDURE, GI TRACT, INTRALUMINAL, VIA CAPSULE;  Surgeon: Marnee Sink, MD;  Location:  [facility] ENDOSCOPY;  Service: Endoscopy;  Laterality: N/A;   IVC FILTER INSERTION N/A 11/28/2023   Procedure: IVC FILTER INSERTION;  Surgeon: Jackquelyn Mass, MD;  Location: [facility] INVASIVE CV LAB;  Service: Cardiovascular;  Laterality: N/A;   LOWER EXTREMITY ANGIOGRAPHY Left 11/24/2023   Procedure: Lower Extremity Angiography;  Surgeon: Jackquelyn Mass, MD;  Location: [facility] INVASIVE CV LAB;  Service: Cardiovascular;  Laterality: Left;   TONSILLECTOMY      SOCIAL HISTORY: Social History   Socioeconomic History   Marital status: Married    Spouse name: Not on file   Number of children: Not on file   Years of education: Not on file   Highest education level: Not on file  Occupational History   Not on file  Tobacco Use   Smoking status: Every Day    Current packs/day: 1.00    Average packs/day: 1 pack/day for 15.0 years (15.0 ttl pk-yrs)    Types: Cigarettes   Smokeless tobacco: Never   Tobacco comments:    patient declined  Vaping Use   Vaping status: Former  Substance and Sexual Activity   Alcohol use: Not Currently    Comment: not taken for about 6 weeks   Drug use: Not Currently    Types: Marijuana    Comment: Uses street opioids   Sexual activity: Not Currently  Other Topics Concern   Not on file  Social History Narrative   Works at [employer];  Smoke 15 cig/day; No alcohol abuse; in [employer]; with husband.    Social Drivers of Corporate investment banker Strain: Low Risk  (11/15/2023)   Received [facility]   Overall Financial Resource Strain (CARDIA)    Difficulty of Paying Living Expenses: Not very hard  Food Insecurity: Patient Unable To Answer (11/25/2023)   Hunger Vital Sign    Worried About Running Out of Food in the Last Year: Patient unable to answer    Ran Out of Food in the Last Year: Patient unable to answer  Transportation Needs: Patient Unable To Answer (11/25/2023)   PRAPARE - Transportation    Lack of Transportation  (Medical): Patient unable to answer    Lack of Transportation (Non-Medical): Patient unable to answer  Physical Activity: Not on file  Stress: Not on file  Social Connections: Not on file  Intimate Partner Violence: Patient Unable To Answer (11/25/2023)   Humiliation, Afraid, Rape, and Kick questionnaire    Fear of Current or Ex-Partner: Patient unable to answer    Emotionally Abused: Patient unable to answer    Physically Abused: Patient unable to answer    Sexually Abused: Patient unable to answer    FAMILY HISTORY: Family History  Problem Relation Age of Onset   Diabetes Mother    Atrial fibrillation Father    Breast cancer Paternal Aunt     ALLERGIES:  is allergic to amlodipine .  MEDICATIONS:  Current Outpatient Medications  Medication Sig Dispense Refill   acetaminophen  (TYLENOL ) 500 MG tablet Take 500 mg by mouth every 6 (six) hours as needed for moderate pain (pain score 4-6).     celecoxib (CELEBREX) 100 MG capsule Take 100 mg by mouth 2 (two) times daily.     citalopram  (CELEXA ) 20 MG tablet Take 20 mg by mouth daily.  cyanocobalamin  1000 MCG tablet Take 1 tablet (1,000 mcg total) by mouth daily. 30 tablet 2   cyclobenzaprine  (FLEXERIL ) 5 MG tablet Take 1 tablet (5 mg total) by mouth 3 (three) times daily as needed for muscle spasms. 30 tablet 0   folic acid  (FOLVITE ) 1 MG tablet Take 1 tablet (1 mg total) by mouth daily. 30 tablet 0   gabapentin  (NEURONTIN ) 600 MG tablet Take 1 tablet (600 mg total) by mouth 3 (three) times daily. 90 tablet 0   iron  polysaccharides (NIFEREX) 150 MG capsule Take 1 capsule (150 mg total) by mouth daily. 30 capsule 0   oxyCODONE  (OXY IR/ROXICODONE ) 5 MG immediate release tablet Take 1 tablet (5 mg total) by mouth every 6 (six) hours as needed for severe pain (pain score 7-10). 46 tablet 0   pantoprazole  (PROTONIX ) 40 MG tablet Take 1 tablet (40 mg total) by mouth 2 (two) times daily before a meal. 60 tablet 1   pravastatin  (PRAVACHOL ) 10  MG tablet Take 1 tablet by mouth daily.     No current facility-administered medications for this visit.   PHYSICAL EXAMINATION: ECOG PERFORMANCE STATUS: 0 - Asymptomatic  Vitals:   01/17/24 1342  BP: 117/85  Pulse: 84  Resp: 16  Temp: 98.5 F (36.9 C)  SpO2: 97%   Filed Weights   Physical Exam Vitals reviewed.  HENT:     Head: Normocephalic and atraumatic.  Cardiovascular:     Rate and Rhythm: Normal rate and regular rhythm.  Pulmonary:     Effort: Pulmonary effort is normal. No respiratory distress.     Breath sounds: No wheezing.  Abdominal:     General: There is no distension.     Palpations: Abdomen is soft.     Tenderness: There is no abdominal tenderness. There is no guarding.  Musculoskeletal:        General: Deformity present.  Skin:    General: Skin is warm.     Coloration: Skin is not pale.  Neurological:     Mental Status: She is alert and oriented to person, place, and time.  Psychiatric:        Mood and Affect: Mood and affect normal.        Behavior: Behavior normal.     Comments: Anxious/tearful    LABORATORY DATA:  I have reviewed the data as listed Lab Results  Component Value Date   WBC 11.8 (H) 01/17/2024   HGB 12.5 01/17/2024   HCT 41.3 01/17/2024   MCV 90.6 01/17/2024   PLT 442 (H) 01/17/2024   Recent Labs    11/23/23 0949 11/24/23 0254 11/25/23 0816 12/16/23 1320 01/17/24 1312  NA 135 133* 137 136 137  K 3.9 3.8 4.2 4.2 4.1  CL 102 102 109 104 103  CO2 23 21* 24 25 26   GLUCOSE 112* 124* 118* 96 95  BUN 14 15 15 16 14   CREATININE 0.78 0.68 0.73 0.76 0.83  CALCIUM  8.7* 8.3* 8.4* 8.0* 8.6*  GFRNONAA >60 >60 >60 >60 >60  PROT 7.6 6.6  --   --   --   ALBUMIN 2.9* 2.5*  --   --   --   AST 38 28  --   --   --   ALT 33 27  --   --   --   ALKPHOS 111 97  --   --   --   BILITOT 0.3 0.5  --   --   --    Iron /TIBC/Ferritin/ %Sat  Component Value Date/Time   IRON  23 (L) 11/26/2023 0526   TIBC 211 (L) 11/26/2023 0526    FERRITIN 535 (H) 11/26/2023 0526   IRONPCTSAT 11 11/26/2023 0526   RADIOGRAPHIC STUDIES: I have personally reviewed the radiological images as listed and agreed with the findings in the report. No results found.   ASSESSMENT & PLAN:   Carcinoma of upper-outer quadrant of right breast in female, estrogen receptor positive   # Stage IA ER PR positive HER-2/neu negative.;  Oncotype low risk. Stopped tamoxifen  in 2022 d/t psychiatric complications & mood swings. Mammogram 10/2022 WNL. 2025 mammogram delayed d/t hospitalization. Plan to proceed with mammogram then annually. Reviewed symptoms that would be concerning for recurrent disease and warrant sooner return.      # Symptomatic anemia- secondary to recent GI bleed/history of colitis-hemoglobin today 8. S/p iron  infusions. Hmg 12.5. Hold venofer .    # MARCH 2025- [facility] multiple subsegmental PE-left lower extremity arterial ischemia -DVT-status post thrombectomy status post intervention at [facility]-currently status post BKA of the left lower extremity. Hypercoagulable workup-April 2025-unremarkable protein C slightly low expected from acute events. Currently off Eliquis. At high risk of recurrent thrombotic event. ? IVC filter placement vs reinitiation of anticoagulation once bleeding is resolved.    # Active smoker: Discussed with the patient regarding the ill effects of smoking- including but not limited to cardiac lung and vascular diseases and malignancies. Counseled against smoking; patient-not interested in quitting.    # DISPOSITION: Mammogram 4 weeks- lab (cbc, bmp, ferritin, iron  studies), Dr Valentine Gasmen, +/- venofer - la   No problem-specific Assessment & Plan notes found for this encounter.  All questions were answered. The patient knows to call the clinic with any problems, questions or concerns.    Nelda Balsam, NP 01/17/2024

## 2024-01-17 NOTE — Progress Notes (Unsigned)
 Pt in for follow up, reports having lower back pain.  Decreased energy levels.

## 2024-01-19 ENCOUNTER — Encounter: Payer: Self-pay | Admitting: Internal Medicine

## 2024-01-23 ENCOUNTER — Encounter (INDEPENDENT_AMBULATORY_CARE_PROVIDER_SITE_OTHER): Payer: Self-pay | Admitting: Vascular Surgery

## 2024-01-23 ENCOUNTER — Ambulatory Visit (INDEPENDENT_AMBULATORY_CARE_PROVIDER_SITE_OTHER): Admitting: Vascular Surgery

## 2024-01-23 VITALS — BP 140/88 | HR 75

## 2024-01-23 DIAGNOSIS — I2699 Other pulmonary embolism without acute cor pulmonale: Secondary | ICD-10-CM

## 2024-01-23 DIAGNOSIS — Z89512 Acquired absence of left leg below knee: Secondary | ICD-10-CM

## 2024-01-23 DIAGNOSIS — R52 Pain, unspecified: Secondary | ICD-10-CM

## 2024-01-23 NOTE — Progress Notes (Addendum)
 Patient ID: Penny Chase, female   DOB: [birth year], 53 y.o.   MRN: [id number]  Chief Complaint  Patient presents with   Follow-up    3 weeks - wound check--bone popping up    HPI Penny Chase is a 53 y.o. female.    Patient presents for follow-up regarding her left below-knee amputation site.  Surgery was performed November 26, 2023.  Today she notes that her pain is well-controlled with some phantom pains but she is able to work through it.  She also notes that her filter was placed November 28, 2023.  She is concerned because of a prominence of the distal end of the tibia.  She feels that that it might be popping up.  And it is rubbing on her prosthesis.  She is complaining of chest and abdominal pain.  Somewhat vague in nature but seems to be intensifying.  No other associated symptoms.   Past Medical History:  Diagnosis Date   Breast cancer (HCC)    Cancer (HCC) 09/2018   Right breast   COVID-19 11/2018   Family history of breast cancer    Hypertension    Personal history of radiation therapy 2020   Rt breast    Past Surgical History:  Procedure Laterality Date   AMPUTATION Left 11/26/2023   Procedure: AMPUTATION BELOW KNEE;  Surgeon: Jama Cordella MATSU, MD;  Location: [facility] ORS;  Service: Vascular;  Laterality: Left;   BREAST BIOPSY Right 09/12/2018   us  core/venus clip-INVASIVE MAMMARYLobular CARCINOMA, WITH LOBULAR FEATURES   BREAST LUMPECTOMY Right 10/04/2018   BREAST LUMPECTOMY WITH NEEDLE LOCALIZATION AND AXILLARY SENTINEL LYMPH NODE BX Right 10/04/2018   Procedure: BREAST LUMPECTOMY WITH NEEDLE LOCALIZATION AND AXILLARY SENTINEL LYMPH NODE BX;  Surgeon: Rodolph Romano, MD;  Location: [facility] ORS;  Service: General;  Laterality: Right;   COLONOSCOPY N/A 11/28/2023   Procedure: COLONOSCOPY;  Surgeon: Jinny Carmine, MD;  Location: [facility] ENDOSCOPY;  Service: Endoscopy;  Laterality: N/A;   ESOPHAGOGASTRODUODENOSCOPY N/A 11/28/2023   Procedure: EGD  (ESOPHAGOGASTRODUODENOSCOPY);  Surgeon: Jinny Carmine, MD;  Location: [facility] ENDOSCOPY;  Service: Endoscopy;  Laterality: N/A;   GIVENS CAPSULE STUDY N/A 11/28/2023   Procedure: IMAGING PROCEDURE, GI TRACT, INTRALUMINAL, VIA CAPSULE;  Surgeon: Jinny Carmine, MD;  Location: [facility] ENDOSCOPY;  Service: Endoscopy;  Laterality: N/A;   IVC FILTER INSERTION N/A 11/28/2023   Procedure: IVC FILTER INSERTION;  Surgeon: Jama Cordella MATSU, MD;  Location: [facility] INVASIVE CV LAB;  Service: Cardiovascular;  Laterality: N/A;   LOWER EXTREMITY ANGIOGRAPHY Left 11/24/2023   Procedure: Lower Extremity Angiography;  Surgeon: Jama Cordella MATSU, MD;  Location: [facility] INVASIVE CV LAB;  Service: Cardiovascular;  Laterality: Left;   TONSILLECTOMY        Allergies  Allergen Reactions   Amlodipine  Swelling    Current Outpatient Medications  Medication Sig Dispense Refill   acetaminophen  (TYLENOL ) 500 MG tablet Take 500 mg by mouth every 6 (six) hours as needed for moderate pain (pain score 4-6).     celecoxib (CELEBREX) 100 MG capsule Take 100 mg by mouth 2 (two) times daily.     citalopram  (CELEXA ) 20 MG tablet Take 20 mg by mouth daily.     cyanocobalamin  1000 MCG tablet Take 1 tablet (1,000 mcg total) by mouth daily. 30 tablet 2   cyclobenzaprine  (FLEXERIL ) 5 MG tablet Take 1 tablet (5 mg total) by mouth 3 (three) times daily as needed for muscle spasms. 30 tablet 0   folic acid  (FOLVITE ) 1 MG tablet Take  1 tablet (1 mg total) by mouth daily. 30 tablet 0   gabapentin  (NEURONTIN ) 600 MG tablet Take 1 tablet (600 mg total) by mouth 3 (three) times daily. 90 tablet 0   iron  polysaccharides (NIFEREX) 150 MG capsule Take 1 capsule (150 mg total) by mouth daily. 30 capsule 0   oxyCODONE  (OXY IR/ROXICODONE ) 5 MG immediate release tablet Take 1 tablet (5 mg total) by mouth every 6 (six) hours as needed for severe pain (pain score 7-10). 46 tablet 0   pantoprazole  (PROTONIX ) 40 MG tablet Take 1 tablet (40 mg total) by mouth 2  (two) times daily before a meal. 60 tablet 1   pravastatin  (PRAVACHOL ) 10 MG tablet Take 1 tablet by mouth daily.     No current facility-administered medications for this visit.        Physical Exam BP (!) 140/88   Pulse 75   LMP 09/20/2018 (Exact Date)  Gen:  WD/WN, NAD Skin: incision C/D/I left below-knee amputation looks quite good.     Assessment/Plan: 1. Hx of left BKA (HCC) (Primary) With respect to her below-knee amputations he seems to have healed and is doing quite well.  I will ask [facility] to evaluate to see whether her socket needs an adjustment.  2. PE (pulmonary thromboembolism) (HCC) With respect to her IVC filter I would plan for removal in 3 to 4 months.  3. Pain Given her increasing symptoms I will obtain a chest abdomen pelvis CT. - CT Angio Chest/Abd/Pel for Dissection W and/or W/WO; Future      Cordella Shawl 01/23/2024, 3:06 PM   This note was created with Dragon medical transcription system.  Any errors from dictation are unintentional.

## 2024-01-25 ENCOUNTER — Encounter (INDEPENDENT_AMBULATORY_CARE_PROVIDER_SITE_OTHER): Payer: Self-pay | Admitting: Vascular Surgery

## 2024-01-25 DIAGNOSIS — Z89512 Acquired absence of left leg below knee: Secondary | ICD-10-CM | POA: Insufficient documentation

## 2024-02-09 ENCOUNTER — Telehealth (INDEPENDENT_AMBULATORY_CARE_PROVIDER_SITE_OTHER): Payer: Self-pay

## 2024-02-09 NOTE — Telephone Encounter (Signed)
 Patient requesting if we could send a referral for Gastroenterology. Please Advise

## 2024-02-10 ENCOUNTER — Other Ambulatory Visit (INDEPENDENT_AMBULATORY_CARE_PROVIDER_SITE_OTHER): Payer: Self-pay | Admitting: Nurse Practitioner

## 2024-02-13 ENCOUNTER — Other Ambulatory Visit: Payer: Self-pay | Admitting: *Deleted

## 2024-02-13 ENCOUNTER — Other Ambulatory Visit (INDEPENDENT_AMBULATORY_CARE_PROVIDER_SITE_OTHER): Payer: Self-pay | Admitting: Nurse Practitioner

## 2024-02-13 DIAGNOSIS — K259 Gastric ulcer, unspecified as acute or chronic, without hemorrhage or perforation: Secondary | ICD-10-CM

## 2024-02-13 DIAGNOSIS — D649 Anemia, unspecified: Secondary | ICD-10-CM

## 2024-02-13 DIAGNOSIS — R52 Pain, unspecified: Secondary | ICD-10-CM | POA: Insufficient documentation

## 2024-02-13 NOTE — Addendum Note (Signed)
 Addended by: JAMA CORDELLA MATSU on: 02/13/2024 10:23 AM   Modules accepted: Orders

## 2024-02-13 NOTE — Telephone Encounter (Signed)
 sent

## 2024-02-13 NOTE — Telephone Encounter (Signed)
Patient has no preference.

## 2024-02-13 NOTE — Telephone Encounter (Signed)
 Can we ask for reason, so that we could place it on the referral

## 2024-02-14 ENCOUNTER — Telehealth (INDEPENDENT_AMBULATORY_CARE_PROVIDER_SITE_OTHER): Payer: Self-pay | Admitting: Vascular Surgery

## 2024-02-14 ENCOUNTER — Encounter: Payer: Self-pay | Admitting: Internal Medicine

## 2024-02-14 ENCOUNTER — Inpatient Hospital Stay: Admitting: Internal Medicine

## 2024-02-14 ENCOUNTER — Inpatient Hospital Stay: Attending: Internal Medicine

## 2024-02-14 ENCOUNTER — Inpatient Hospital Stay

## 2024-02-14 VITALS — BP 121/84 | HR 87 | Temp 97.5°F | Resp 12 | Ht 64.0 in | Wt 167.8 lb

## 2024-02-14 DIAGNOSIS — Z1732 Human epidermal growth factor receptor 2 negative status: Secondary | ICD-10-CM | POA: Diagnosis not present

## 2024-02-14 DIAGNOSIS — F17210 Nicotine dependence, cigarettes, uncomplicated: Secondary | ICD-10-CM | POA: Diagnosis not present

## 2024-02-14 DIAGNOSIS — D500 Iron deficiency anemia secondary to blood loss (chronic): Secondary | ICD-10-CM | POA: Insufficient documentation

## 2024-02-14 DIAGNOSIS — I2694 Multiple subsegmental pulmonary emboli without acute cor pulmonale: Secondary | ICD-10-CM | POA: Diagnosis not present

## 2024-02-14 DIAGNOSIS — D649 Anemia, unspecified: Secondary | ICD-10-CM

## 2024-02-14 DIAGNOSIS — C50411 Malignant neoplasm of upper-outer quadrant of right female breast: Secondary | ICD-10-CM | POA: Insufficient documentation

## 2024-02-14 DIAGNOSIS — Z170 Estrogen receptor positive status [ER+]: Secondary | ICD-10-CM

## 2024-02-14 DIAGNOSIS — Z803 Family history of malignant neoplasm of breast: Secondary | ICD-10-CM | POA: Diagnosis not present

## 2024-02-14 DIAGNOSIS — Z79899 Other long term (current) drug therapy: Secondary | ICD-10-CM | POA: Diagnosis not present

## 2024-02-14 DIAGNOSIS — Z1721 Progesterone receptor positive status: Secondary | ICD-10-CM | POA: Diagnosis not present

## 2024-02-14 LAB — BASIC METABOLIC PANEL - CANCER CENTER ONLY
Anion gap: 10 (ref 5–15)
BUN: 13 mg/dL (ref 6–20)
CO2: 27 mmol/L (ref 22–32)
Calcium: 9.7 mg/dL (ref 8.9–10.3)
Chloride: 98 mmol/L (ref 98–111)
Creatinine: 0.85 mg/dL (ref 0.44–1.00)
GFR, Estimated: >60 mL/min (ref >60)
Glucose, Bld: 105 mg/dL — ABNORMAL HIGH (ref 70–99)
Potassium: 4.3 mmol/L (ref 3.5–5.1)
Sodium: 135 mmol/L (ref 135–145)

## 2024-02-14 LAB — CBC WITH DIFFERENTIAL (CANCER CENTER ONLY)
Abs Immature Granulocytes: 0.06 10*3/uL (ref 0.00–0.07)
Basophils Absolute: 0.1 10*3/uL (ref 0.0–0.1)
Basophils Relative: 1 %
Eosinophils Absolute: 0.3 10*3/uL (ref 0.0–0.5)
Eosinophils Relative: 3 %
HCT: 44.3 % (ref 36.0–46.0)
Hemoglobin: 14 g/dL (ref 12.0–15.0)
Immature Granulocytes: 1 %
Lymphocytes Relative: 16 %
Lymphs Abs: 1.7 10*3/uL (ref 0.7–4.0)
MCH: 27.6 pg (ref 26.0–34.0)
MCHC: 31.6 g/dL (ref 30.0–36.0)
MCV: 87.4 fL (ref 80.0–100.0)
Monocytes Absolute: 0.7 10*3/uL (ref 0.1–1.0)
Monocytes Relative: 7 %
Neutro Abs: 8 10*3/uL — ABNORMAL HIGH (ref 1.7–7.7)
Neutrophils Relative %: 72 %
Platelet Count: 417 10*3/uL — ABNORMAL HIGH (ref 150–400)
RBC: 5.07 MIL/uL (ref 3.87–5.11)
RDW: 17.4 % — ABNORMAL HIGH (ref 11.5–15.5)
WBC Count: 10.8 10*3/uL — ABNORMAL HIGH (ref 4.0–10.5)
nRBC: 0 % (ref 0.0–0.2)

## 2024-02-14 LAB — IRON AND TIBC
Iron: 22 ug/dL — ABNORMAL LOW (ref 28–170)
Saturation Ratios: 9 % — ABNORMAL LOW (ref 10.4–31.8)
TIBC: 259 ug/dL (ref 250–450)
UIBC: 237 ug/dL

## 2024-02-14 LAB — FERRITIN: Ferritin: 218 ng/mL (ref 11–307)

## 2024-02-14 MED ORDER — VENLAFAXINE HCL ER 37.5 MG PO CP24: 37.5000 mg | ORAL_CAPSULE | Freq: Every day | ORAL | 1 refills | Status: DC

## 2024-02-14 NOTE — Progress Notes (Signed)
 [facility] CONSULT NOTE  Patient Care Team: Center, [facility] as PCP - General (General Practice) Chipper Viviane POUR, MD as Consulting Physician (Psychiatry) Rennie Cindy SAUNDERS, MD as Consulting Physician (Oncology)  CHIEF COMPLAINTS/PURPOSE OF CONSULTATION:  Breast cancer/anemia/ DVT-PE  #  Oncology History Overview Note  # JAN 2020- INVASIVE LOBULAR CARCINOMA, WITH FEATURES. pT2 (79mm)psN0; (Dr.Cintron ) Stage IA; ER/PR >90%; her-2 neu-NEG.  #  RT finish May 30th; oncotype RS score- 11; 3% DR at 8 years; No adjuvant chemo  # June 1st 2020- Start Tam [pre-menopausal]; STOPPED in 2022- sec to mood swings/psyche-LOST TO FOLLOW UP.   # HTN/ Covid positive [April 2020]  #S/p genetic counseling-declined testing [low risk/no children]   DIAGNOSIS: Right breast cancer  STAGE:     1    ;GOALS: Cure    Carcinoma of upper-outer quadrant of right breast in female, estrogen receptor positive (HCC)  09/20/2018 Initial Diagnosis   Carcinoma of upper-outer quadrant of right breast in female, estrogen receptor positive (HCC)      HISTORY OF PRESENTING ILLNESS: Patient ambulating- with assistance-name wheelchair.  Alone.   Penny Chase 53 y.o.  female  Stage I ER/PR pos her 2 Neg Breast cancer -  discontinued tamoxifen  for psychiatric reasons in 2022;  march-April 2025-multiple subsegmental PEs, lower extremity arterial occlusion status post thrombectomy- LEFT BKA  currently OFF eliquis sec to  ischemic colitis.    Patient s/p iron  infusions.  Notes improvement of her breathing symptoms; and improvement of fatigue..  Denies any blood in stools or black-colored stool.  Any nausea or vomiting.  Review of Systems  Constitutional:  Negative for chills, diaphoresis, fever, malaise/fatigue and weight loss.  HENT:  Negative for nosebleeds and sore throat.   Eyes:  Negative for double vision.  Respiratory:  Negative for cough, hemoptysis, sputum production, shortness  of breath and wheezing.   Cardiovascular:  Negative for chest pain, palpitations, orthopnea and leg swelling.  Gastrointestinal:  Negative for abdominal pain, blood in stool, constipation, diarrhea, heartburn, melena, nausea and vomiting.  Musculoskeletal:  Positive for back pain, joint pain and myalgias.  Skin: Negative.  Negative for itching and rash.  Neurological:  Negative for dizziness, tingling, focal weakness, weakness and headaches.  Endo/Heme/Allergies:  Does not bruise/bleed easily.  Psychiatric/Behavioral:  Positive for depression. The patient is nervous/anxious and has insomnia.      MEDICAL HISTORY:  Past Medical History:  Diagnosis Date   Acute lower limb ischemia    Anemia    Breast cancer (HCC)    Cancer (HCC) 09/2018   Right breast   COVID-19 11/2018   Family history of breast cancer    GERD (gastroesophageal reflux disease)    Hypertension    Personal history of radiation therapy 2020   Rt breast   Pulmonary embolism (HCC)     SURGICAL HISTORY: Past Surgical History:  Procedure Laterality Date   AMPUTATION Left 11/26/2023   Procedure: AMPUTATION BELOW KNEE;  Surgeon: Jama Cordella MATSU, MD;  Location: [facility] ORS;  Service: Vascular;  Laterality: Left;   BREAST BIOPSY Right 09/12/2018   us  core/venus clip-INVASIVE MAMMARYLobular CARCINOMA, WITH LOBULAR FEATURES   BREAST LUMPECTOMY Right 10/04/2018   BREAST LUMPECTOMY WITH NEEDLE LOCALIZATION AND AXILLARY SENTINEL LYMPH NODE BX Right 10/04/2018   Procedure: BREAST LUMPECTOMY WITH NEEDLE LOCALIZATION AND AXILLARY SENTINEL LYMPH NODE BX;  Surgeon: Rodolph Romano, MD;  Location: [facility] ORS;  Service: General;  Laterality: Right;   COLONOSCOPY N/A 11/28/2023   Procedure:  COLONOSCOPY;  Surgeon: Jinny Carmine, MD;  Location: [facility] ENDOSCOPY;  Service: Endoscopy;  Laterality: N/A;   ESOPHAGOGASTRODUODENOSCOPY N/A 11/28/2023   Procedure: EGD (ESOPHAGOGASTRODUODENOSCOPY);  Surgeon: Jinny Carmine, MD;  Location: [facility]  ENDOSCOPY;  Service: Endoscopy;  Laterality: N/A;   GIVENS CAPSULE STUDY N/A 11/28/2023   Procedure: IMAGING PROCEDURE, GI TRACT, INTRALUMINAL, VIA CAPSULE;  Surgeon: Jinny Carmine, MD;  Location: [facility] ENDOSCOPY;  Service: Endoscopy;  Laterality: N/A;   IVC FILTER INSERTION N/A 11/28/2023   Procedure: IVC FILTER INSERTION;  Surgeon: Jama Cordella MATSU, MD;  Location: [facility] INVASIVE CV LAB;  Service: Cardiovascular;  Laterality: N/A;   LOWER EXTREMITY ANGIOGRAPHY Left 11/24/2023   Procedure: Lower Extremity Angiography;  Surgeon: Jama Cordella MATSU, MD;  Location: [facility] INVASIVE CV LAB;  Service: Cardiovascular;  Laterality: Left;   TONSILLECTOMY      SOCIAL HISTORY: Social History   Socioeconomic History   Marital status: Married    Spouse name: Not on file   Number of children: Not on file   Years of education: Not on file   Highest education level: Not on file  Occupational History   Not on file  Tobacco Use   Smoking status: Every Day    Current packs/day: 1.00    Average packs/day: 1 pack/day for 15.0 years (15.0 ttl pk-yrs)    Types: Cigarettes   Smokeless tobacco: Never   Tobacco comments:    patient declined  Vaping Use   Vaping status: Former  Substance and Sexual Activity   Alcohol use: Not Currently    Comment: not taken for about 6 weeks   Drug use: Not Currently    Types: Marijuana    Comment: Uses street opioids   Sexual activity: Not Currently  Other Topics Concern   Not on file  Social History Narrative   Works at [employer];  Smoke 15 cig/day; No alcohol abuse; in [employer]; with husband.    Social Drivers of Corporate investment banker Strain: Low Risk  (11/15/2023)   [facility]   Overall Financial Resource Strain (CARDIA)    Difficulty of Paying Living Expenses: Not very hard  Food Insecurity: No Food Insecurity (02/14/2024)   Hunger Vital Sign    Worried About Running Out of Food in the Last Year: Never true    Ran Out  of Food in the Last Year: Never true  Transportation Needs: No Transportation Needs (02/14/2024)   PRAPARE - Administrator, Civil Service (Medical): No    Lack of Transportation (Non-Medical): No  Physical Activity: Not on file  Stress: Not on file  Social Connections: Not on file  Intimate Partner Violence: Not At Risk (02/14/2024)   Humiliation, Afraid, Rape, and Kick questionnaire    Fear of Current or Ex-Partner: No    Emotionally Abused: No    Physically Abused: No    Sexually Abused: No    FAMILY HISTORY: Family History  Problem Relation Age of Onset   Diabetes Mother    Atrial fibrillation Father    Breast cancer Paternal Aunt     ALLERGIES:  is allergic to amlodipine .  MEDICATIONS:  Current Outpatient Medications  Medication Sig Dispense Refill   acetaminophen  (TYLENOL ) 500 MG tablet Take 500 mg by mouth every 6 (six) hours as needed for moderate pain (pain score 4-6).     celecoxib (CELEBREX) 100 MG capsule Take 100 mg by mouth 2 (two) times daily.     cyanocobalamin  1000 MCG tablet  Take 1 tablet (1,000 mcg total) by mouth daily. 30 tablet 2   cyclobenzaprine  (FLEXERIL ) 5 MG tablet Take 1 tablet (5 mg total) by mouth 3 (three) times daily as needed for muscle spasms. 30 tablet 0   folic acid  (FOLVITE ) 1 MG tablet Take 1 tablet (1 mg total) by mouth daily. 30 tablet 0   gabapentin  (NEURONTIN ) 600 MG tablet Take 1 tablet (600 mg total) by mouth 3 (three) times daily. 90 tablet 0   iron  polysaccharides (NIFEREX) 150 MG capsule Take 1 capsule (150 mg total) by mouth daily. 30 capsule 0   oxyCODONE  (OXY IR/ROXICODONE ) 5 MG immediate release tablet Take 1 tablet (5 mg total) by mouth every 6 (six) hours as needed for severe pain (pain score 7-10). 46 tablet 0   pantoprazole  (PROTONIX ) 40 MG tablet Take 1 tablet (40 mg total) by mouth 2 (two) times daily before a meal. 60 tablet 1   pravastatin  (PRAVACHOL ) 10 MG tablet Take 1 tablet by mouth daily.     venlafaxine XR  (EFFEXOR-XR) 37.5 MG 24 hr capsule Take 1 capsule (37.5 mg total) by mouth daily with breakfast. 60 capsule 1   No current facility-administered medications for this visit.      SABRA  PHYSICAL EXAMINATION: ECOG PERFORMANCE STATUS: 0 - Asymptomatic  Vitals:   02/14/24 1330 02/14/24 1348  BP: (!) 135/103 121/84  Pulse: 87   Resp: 16 12  Temp: (!) 97.5 F (36.4 C)   SpO2: 100%    Filed Weights   02/14/24 1330  Weight: 167 lb 12.8 oz (76.1 kg)   Left lower extremity BKA  Physical Exam HENT:     Head: Normocephalic and atraumatic.     Mouth/Throat:     Pharynx: No oropharyngeal exudate.   Eyes:     Pupils: Pupils are equal, round, and reactive to light.    Cardiovascular:     Rate and Rhythm: Normal rate and regular rhythm.  Pulmonary:     Effort: Pulmonary effort is normal. No respiratory distress.     Breath sounds: Normal breath sounds. No wheezing.  Abdominal:     General: Bowel sounds are normal. There is no distension.     Palpations: Abdomen is soft. There is no mass.     Tenderness: There is no abdominal tenderness. There is no guarding or rebound.   Musculoskeletal:        General: No tenderness. Normal range of motion.     Cervical back: Normal range of motion and neck supple.   Skin:    General: Skin is warm.   Neurological:     Mental Status: She is alert and oriented to person, place, and time.   Psychiatric:        Mood and Affect: Affect normal.     Comments: Anxious/tearful      LABORATORY DATA:  I have reviewed the data as listed Lab Results  Component Value Date   WBC 10.8 (H) 02/14/2024   HGB 14.0 02/14/2024   HCT 44.3 02/14/2024   MCV 87.4 02/14/2024   PLT 417 (H) 02/14/2024   Recent Labs    11/23/23 0949 11/24/23 0254 11/25/23 0816 12/16/23 1320 01/17/24 1312 02/14/24 1328  NA 135 133*   < > 136 137 135  K 3.9 3.8   < > 4.2 4.1 4.3  CL 102 102   < > 104 103 98  CO2 23 21*   < > 25 26 27   GLUCOSE 112* 124*   < >  96 95  105*  BUN 14 15   < > 16 14 13   CREATININE 0.78 0.68   < > 0.76 0.83 0.85  CALCIUM  8.7* 8.3*   < > 8.0* 8.6* 9.7  GFRNONAA >60 >60   < > >60 >60 >60  PROT 7.6 6.6  --   --   --   --   ALBUMIN 2.9* 2.5*  --   --   --   --   AST 38 28  --   --   --   --   ALT 33 27  --   --   --   --   ALKPHOS 111 97  --   --   --   --   BILITOT 0.3 0.5  --   --   --   --    < > = values in this interval not displayed.    RADIOGRAPHIC STUDIES: I have personally reviewed the radiological images as listed and agreed with the findings in the report. No results found.   ASSESSMENT & PLAN:   Carcinoma of upper-outer quadrant of right breast in female, estrogen receptor positive (HCC) # Stage IA ER PR positive HER-2/neu negative.;  Oncotype low risk - on Tamoxifen .  Stable mammogram Feb 2022- WNL; OFF Tamoxifen  since 2022.  patient DISCONTINUED TAMOXIFEN -because of psychiatric complications/mood swings.  Recommend mammogram-will schedule.  # Symptomatic anemia-secondary to recent GI bleed/history of colitis-hemoglobin today 8 s/p iron  infusions- Hb today 14- HOLD off any venofer  at this time.   # MARCH 2025- [facility] multiple subsegmental PE-left lower extremity arterial ischemia -DVT-status post thrombectomy status post intervention at [facility]-currently status post BKA of the left lower extremity. Monitor closely.  Hypercoagulable workup-April 2025-unremarkable protein C slightly low expected from acute events. Currently OFF Eliquis.  At high risk of recurrent thrombotic event. ? IVC filter placement vs reinitiation of anticoagulation once bleeding is resolved. Defer to vascular/Dr.Schnier   # active smoker: Discussed with the patient regarding the ill effects of smoking- including but not limited to cardiac lung and vascular diseases and malignancies. Counseled against smoking; patient-not interested in quitting.   # Hot flashes- post menopausal- recommend/start effexor 37.mg XR- q day; stop celexa  [depression]-   #  DISPOSITION: # schedule mammogram # HOLD venofer - today # follow up in 3 months- lab- cbc/bmp; iron  studies;ferritin; possible venofer - Dr.B   All questions were answered. The patient knows to call the clinic with any problems, questions or concerns.    Cindy JONELLE Joe, MD 02/14/2024 2:44 PM

## 2024-02-14 NOTE — Progress Notes (Signed)
 Fatigue/weakness: NO Dyspena: NO Light headedness: NO  Blood in stool: NO    Pt asking if you could rx something for hot flashes? They are getting uncomfortable.

## 2024-02-14 NOTE — Telephone Encounter (Signed)
 Spoke with pt to advise authorization has been received for CT ordered by Dr. Jama. I advised to call radiology scheduling at [phone] to schedule appt. I asked pt to call back to the office to schedule a CT results appt with Dr. Jama.

## 2024-02-14 NOTE — Assessment & Plan Note (Addendum)
#   Stage IA ER PR positive HER-2/neu negative.;  Oncotype low risk - on Tamoxifen .  Stable mammogram Feb 2022- WNL; OFF Tamoxifen  since 2022.  patient DISCONTINUED TAMOXIFEN -because of psychiatric complications/mood swings.  Recommend mammogram-will schedule.  # Symptomatic anemia-secondary to recent GI bleed/history of colitis-hemoglobin today 8 s/p iron  infusions- Hb today 14- HOLD off any venofer  at this time.   # MARCH 2025- [facility] multiple subsegmental PE-left lower extremity arterial ischemia -DVT-status post thrombectomy status post intervention at [facility]-currently status post BKA of the left lower extremity. Monitor closely.  Hypercoagulable workup-April 2025-unremarkable protein C slightly low expected from acute events. Currently OFF Eliquis.  At high risk of recurrent thrombotic event. ? IVC filter placement vs reinitiation of anticoagulation once bleeding is resolved. Defer to vascular/Dr.Schnier   # active smoker: Discussed with the patient regarding the ill effects of smoking- including but not limited to cardiac lung and vascular diseases and malignancies. Counseled against smoking; patient-not interested in quitting.   # Hot flashes- post menopausal- recommend/start effexor 37.mg XR- q day; stop celexa  [depression]-   # DISPOSITION: # schedule mammogram # HOLD venofer - today # follow up in 3 months- lab- cbc/bmp; iron  studies;ferritin; possible venofer - Dr.B

## 2024-02-15 ENCOUNTER — Other Ambulatory Visit: Payer: Self-pay | Admitting: Vascular Surgery

## 2024-02-15 DIAGNOSIS — I700 Atherosclerosis of aorta: Secondary | ICD-10-CM

## 2024-02-16 ENCOUNTER — Ambulatory Visit
Admission: RE | Admit: 2024-02-16 | Discharge: 2024-02-16 | Disposition: A | Discharge status: Home / Self Care | Source: Ambulatory Visit | Attending: Vascular Surgery | Admitting: Vascular Surgery

## 2024-02-16 DIAGNOSIS — I700 Atherosclerosis of aorta: Secondary | ICD-10-CM | POA: Diagnosis present

## 2024-02-16 DIAGNOSIS — R52 Pain, unspecified: Secondary | ICD-10-CM | POA: Insufficient documentation

## 2024-02-16 MED ORDER — IOHEXOL 350 MG/ML SOLN
100.0000 mL | Freq: Once | INTRAVENOUS | Status: AC | PRN
  Administered 2024-02-16: 100 mL via INTRAVENOUS

## 2024-02-27 ENCOUNTER — Ambulatory Visit
Admission: RE | Admit: 2024-02-27 | Discharge: 2024-02-27 | Disposition: A | Discharge status: Home / Self Care | Source: Ambulatory Visit | Attending: Nurse Practitioner | Admitting: Nurse Practitioner

## 2024-02-27 DIAGNOSIS — C50411 Malignant neoplasm of upper-outer quadrant of right female breast: Secondary | ICD-10-CM | POA: Insufficient documentation

## 2024-02-27 DIAGNOSIS — Z170 Estrogen receptor positive status [ER+]: Secondary | ICD-10-CM | POA: Diagnosis present

## 2024-02-27 DIAGNOSIS — Z1231 Encounter for screening mammogram for malignant neoplasm of breast: Secondary | ICD-10-CM | POA: Insufficient documentation

## 2024-03-01 ENCOUNTER — Ambulatory Visit (INDEPENDENT_AMBULATORY_CARE_PROVIDER_SITE_OTHER): Admitting: Vascular Surgery

## 2024-03-01 ENCOUNTER — Encounter (INDEPENDENT_AMBULATORY_CARE_PROVIDER_SITE_OTHER): Payer: Self-pay | Admitting: Vascular Surgery

## 2024-03-01 VITALS — BP 155/91 | HR 86 | Ht 64.0 in | Wt 174.2 lb

## 2024-03-01 DIAGNOSIS — K5732 Diverticulitis of large intestine without perforation or abscess without bleeding: Secondary | ICD-10-CM

## 2024-03-01 DIAGNOSIS — G546 Phantom limb syndrome with pain: Secondary | ICD-10-CM | POA: Diagnosis not present

## 2024-03-01 DIAGNOSIS — Z89512 Acquired absence of left leg below knee: Secondary | ICD-10-CM

## 2024-03-01 DIAGNOSIS — I70211 Atherosclerosis of native arteries of extremities with intermittent claudication, right leg: Secondary | ICD-10-CM

## 2024-03-02 ENCOUNTER — Encounter (INDEPENDENT_AMBULATORY_CARE_PROVIDER_SITE_OTHER): Payer: Self-pay | Admitting: Vascular Surgery

## 2024-03-02 DIAGNOSIS — I70219 Atherosclerosis of native arteries of extremities with intermittent claudication, unspecified extremity: Secondary | ICD-10-CM | POA: Insufficient documentation

## 2024-03-02 NOTE — Progress Notes (Signed)
 MRN : [id number]  Penny Chase is a 53 y.o. ([birth year]) female who presents with chief complaint of check circulation.  History of Present Illness:   Patient returns to the office with multiple concerns.  She is status post CT angio of the chest abdomen and pelvis.  The CT scan is reviewed by me personally.  There is no evidence of recurrent pulmonary embolism.  The filter appears to be patent with a small amount of thrombus within it.  There are marked inflammatory changes in the pericolic tissues of the left colon.  There is no hemodynamically significant arterial abnormality identified.  Patient continues to complain of abdominal pain as her primary complaint.  It is fairly continuous in nature.  She is also complaining of severe phantom pains in the left lower extremity.  She is status post left below-knee amputation November 26, 2023. She is status post placement of a [place] IVC filter November 28, 2023  Current Meds  Medication Sig   acetaminophen  (TYLENOL ) 500 MG tablet Take 500 mg by mouth every 6 (six) hours as needed for moderate pain (pain score 4-6).   celecoxib (CELEBREX) 100 MG capsule Take 100 mg by mouth 2 (two) times daily.   cyanocobalamin  1000 MCG tablet Take 1 tablet (1,000 mcg total) by mouth daily.   cyclobenzaprine  (FLEXERIL ) 5 MG tablet Take 1 tablet (5 mg total) by mouth 3 (three) times daily as needed for muscle spasms.   folic acid  (FOLVITE ) 1 MG tablet Take 1 tablet (1 mg total) by mouth daily.   gabapentin  (NEURONTIN ) 600 MG tablet Take 1 tablet (600 mg total) by mouth 3 (three) times daily.   iron  polysaccharides (NIFEREX) 150 MG capsule Take 1 capsule (150 mg total) by mouth daily.   oxyCODONE  (OXY IR/ROXICODONE ) 5 MG immediate release tablet Take 1 tablet (5 mg total) by mouth every 6 (six) hours as needed for severe pain (pain score 7-10).   pantoprazole  (PROTONIX ) 40 MG tablet Take 1 tablet  (40 mg total) by mouth 2 (two) times daily before a meal.   pravastatin  (PRAVACHOL ) 10 MG tablet Take 1 tablet by mouth daily.   venlafaxine  XR (EFFEXOR -XR) 37.5 MG 24 hr capsule Take 1 capsule (37.5 mg total) by mouth daily with breakfast.    Past Medical History:  Diagnosis Date   Acute lower limb ischemia    Anemia    Breast cancer (HCC)    Cancer (HCC) 09/2018   Right breast   COVID-19 11/2018   Family history of breast cancer    GERD (gastroesophageal reflux disease)    Hypertension    Personal history of radiation therapy 2020   Rt breast   Pulmonary embolism [facility])     Past Surgical History:  Procedure Laterality Date   AMPUTATION Left 11/26/2023   Procedure: AMPUTATION BELOW KNEE;  Surgeon: Jama Cordella MATSU, MD;  Location: [facility] ORS;  Service: Vascular;  Laterality: Left;   BREAST BIOPSY Right 09/12/2018   us  core/venus clip-INVASIVE MAMMARYLobular CARCINOMA, WITH LOBULAR FEATURES   BREAST LUMPECTOMY Right 10/04/2018   BREAST LUMPECTOMY WITH NEEDLE LOCALIZATION AND AXILLARY  SENTINEL LYMPH NODE BX Right 10/04/2018   Procedure: BREAST LUMPECTOMY WITH NEEDLE LOCALIZATION AND AXILLARY SENTINEL LYMPH NODE BX;  Surgeon: Rodolph Romano, MD;  Location: [facility] ORS;  Service: General;  Laterality: Right;   COLONOSCOPY N/A 11/28/2023   Procedure: COLONOSCOPY;  Surgeon: Jinny Carmine, MD;  Location: [facility] ENDOSCOPY;  Service: Endoscopy;  Laterality: N/A;   ESOPHAGOGASTRODUODENOSCOPY N/A 11/28/2023   Procedure: EGD (ESOPHAGOGASTRODUODENOSCOPY);  Surgeon: Jinny Carmine, MD;  Location: [facility] ENDOSCOPY;  Service: Endoscopy;  Laterality: N/A;   GIVENS CAPSULE STUDY N/A 11/28/2023   Procedure: IMAGING PROCEDURE, GI TRACT, INTRALUMINAL, VIA CAPSULE;  Surgeon: Jinny Carmine, MD;  Location: [facility] ENDOSCOPY;  Service: Endoscopy;  Laterality: N/A;   IVC FILTER INSERTION N/A 11/28/2023   Procedure: IVC FILTER INSERTION;  Surgeon: Jama Cordella MATSU, MD;  Location: [facility] INVASIVE CV LAB;  Service:  Cardiovascular;  Laterality: N/A;   LOWER EXTREMITY ANGIOGRAPHY Left 11/24/2023   Procedure: Lower Extremity Angiography;  Surgeon: Jama Cordella MATSU, MD;  Location: [facility] INVASIVE CV LAB;  Service: Cardiovascular;  Laterality: Left;   TONSILLECTOMY      Social History Social History   Tobacco Use   Smoking status: Every Day    Current packs/day: 1.00    Average packs/day: 1 pack/day for 15.0 years (15.0 ttl pk-yrs)    Types: Cigarettes   Smokeless tobacco: Never   Tobacco comments:    patient declined  Vaping Use   Vaping status: Former  Substance Use Topics   Alcohol use: Not Currently    Comment: not taken for about 6 weeks   Drug use: Not Currently    Types: Marijuana    Comment: Uses street opioids    Family History Family History  Problem Relation Age of Onset   Diabetes Mother    Atrial fibrillation Father    Breast cancer Paternal Aunt     Allergies  Allergen Reactions   Amlodipine  Swelling     REVIEW OF SYSTEMS (Negative unless checked)  Constitutional: [] Weight loss  [] Fever  [] Chills Cardiac: [] Chest pain   [] Chest pressure   [] Palpitations   [] Shortness of breath when laying flat   [] Shortness of breath with exertion. Vascular:  [x] Pain in legs with walking   [] Pain in legs at rest  [] History of DVT   [] Phlebitis   [] Swelling in legs   [] Varicose veins   [] Non-healing ulcers Pulmonary:   [] Uses home oxygen   [] Productive cough   [] Hemoptysis   [] Wheeze  [x] COPD   [] Asthma Neurologic:  [] Dizziness   [] Seizures   [] History of stroke   [] History of TIA  [] Aphasia   [] Vissual changes   [] Weakness or numbness in arm   [x] Weakness or numbness in leg Musculoskeletal:   [] Joint swelling   [] Joint pain   [] Low back pain Hematologic:  [] Easy bruising  [] Easy bleeding   [] Hypercoagulable state   [] Anemic Gastrointestinal:  [] Diarrhea   [] Vomiting  [] Gastroesophageal reflux/heartburn   [] Difficulty swallowing. Genitourinary:  [] Chronic kidney disease   [] Difficult  urination  [] Frequent urination   [] Blood in urine Skin:  [] Rashes   [] Ulcers  Psychological:  [x] History of anxiety   []  History of major depression.  Physical Examination  Vitals:   03/01/24 1058  BP: (!) 155/91  Pulse: 86  Weight: 174 lb 4 oz (79 kg)  Height: 5' 4 (1.626 m)   Body mass index is 29.91 kg/m. Gen: WD/WN, NAD Head: [place]/AT, No temporalis wasting.  Ear/Nose/Throat: Hearing grossly intact, nares w/o erythema or drainage Eyes: PER, EOMI, sclera nonicteric.  Neck: Supple, no masses.  No bruit or JVD.  Pulmonary:  Good air movement, no audible wheezing, no use of accessory muscles.  Cardiac: RRR, normal S1, S2, no Murmurs. Vascular:  mild trophic changes, no open wounds Vessel Right Left  Radial Palpable Palpable  PT Not Palpable BKA  DP Not Palpable BKA  Gastrointestinal: soft, non-distended. No guarding/no peritoneal signs.  Musculoskeletal: M/S 5/5 throughout.  No visible deformity.  Neurologic: CN 2-12 intact. Pain and light touch intact in extremities.  Symmetrical.  Speech is fluent. Motor exam as listed above. Psychiatric: Judgment intact, Mood & affect appropriate for pt's clinical situation. Dermatologic: No rashes or ulcers noted.  No changes consistent with cellulitis.   CBC Lab Results  Component Value Date   WBC 10.8 (H) 02/14/2024   HGB 14.0 02/14/2024   HCT 44.3 02/14/2024   MCV 87.4 02/14/2024   PLT 417 (H) 02/14/2024    BMET    Component Value Date/Time   NA 135 02/14/2024 1328   K 4.3 02/14/2024 1328   CL 98 02/14/2024 1328   CO2 27 02/14/2024 1328   GLUCOSE 105 (H) 02/14/2024 1328   BUN 13 02/14/2024 1328   CREATININE 0.85 02/14/2024 1328   CALCIUM  9.7 02/14/2024 1328   GFRNONAA >60 02/14/2024 1328   GFRAA >60 05/31/2016 1152   Estimated Creatinine Clearance: 76.9 mL/min (by C-G formula based on SCr of 0.85 mg/dL).  COAG Lab Results  Component Value Date   INR 1.1 11/23/2023    Radiology MM 3D SCREENING MAMMOGRAM  BILATERAL BREAST Result Date: 03/02/2024 CLINICAL DATA:  Screening. EXAM: DIGITAL SCREENING BILATERAL MAMMOGRAM WITH TOMOSYNTHESIS AND CAD TECHNIQUE: Bilateral screening digital craniocaudal and mediolateral oblique mammograms were obtained. Bilateral screening digital breast tomosynthesis was performed. The images were evaluated with computer-aided detection. COMPARISON:  Previous exam(s). ACR Breast Density Category b: There are scattered areas of fibroglandular density. FINDINGS: There are no findings suspicious for malignancy. IMPRESSION: No mammographic evidence of malignancy. A result letter of this screening mammogram will be mailed directly to the patient. RECOMMENDATION: Screening mammogram in one year. (Code:SM-B-01Y) BI-RADS CATEGORY  1: Negative. Electronically Signed   By: Dina  Arceo M.D.   On: 03/02/2024 07:41   CT ANGIO CHEST AORTA W/CM & OR WO/CM Result Date: 02/16/2024 CLINICAL DATA:  Aortic atherosclerosis EXAM: CT ANGIOGRAPHY CHEST WITH CONTRAST TECHNIQUE: Multidetector CT imaging of the chest was performed using the standard protocol during bolus administration of intravenous contrast. Multiplanar CT image reconstructions and MIPs were obtained to evaluate the vascular anatomy. Multiplanar image (3D post-processing) reconstructions and MIPs were obtained to evaluate the vascular anatomy. RADIATION DOSE REDUCTION: This exam was performed according to the departmental dose-optimization program which includes automated exposure control, adjustment of the mA and/or kV according to patient size and/or use of iterative reconstruction technique. CONTRAST:  OMNIPAQUE  IOHEXOL  350 MG/ML SOLN COMPARISON:  CT angiogram performed November 23, 2023 FINDINGS: Cardiovascular: Mildly enlarged heart. No pericardial effusion. A small focus of atherosclerotic plaque is present in the left coronary territory. The tubular ascending thoracic aorta is patent and measures 3.5 cm in diameter. Proximal arch vessels  are patent. Minimal atherosclerotic changes present in the descending thoracic aorta. There is no dissection. Main pulmonary artery: Patent and similar in size. The previously observed segmental pulmonary emboli are not seen on the current exam and are improved. Mediastinum/Nodes: No lymphadenopathy. Lungs/Pleura: Scarring is present in the anterior right lung, possibly secondary to prior radiation treatment. Upper Abdomen: Reported separately. Musculoskeletal: No acute or  significant osseous findings. Postsurgical changes are present in the right breast at presumed site of prior surgical therapy. Review of the MIP images confirms the above findings. IMPRESSION: 1. Resolution of previously observed segmental right-sided pulmonary embolus. No significant residual pulmonary embolus is appreciated. Electronically Signed   By: Maude Naegeli M.D.   On: 02/16/2024 17:44   CT Angio Abd/Pel w/ and/or w/o Result Date: 02/16/2024 CLINICAL DATA:  Groin pain with history of DVT EXAM: CTA ABDOMEN AND PELVIS WITHOUT AND WITH CONTRAST TECHNIQUE: Multidetector CT imaging of the abdomen and pelvis was performed using the standard protocol during bolus administration of intravenous contrast. Multiplanar reconstructed images and MIPs were obtained and reviewed to evaluate the vascular anatomy. RADIATION DOSE REDUCTION: This exam was performed according to the departmental dose-optimization program which includes automated exposure control, adjustment of the mA and/or kV according to patient size and/or use of iterative reconstruction technique. CONTRAST:  OMNIPAQUE  IOHEXOL  350 MG/ML SOLN COMPARISON:  CT angiogram performed November 23, 2023 FINDINGS: VASCULAR Aorta: Mild diffuse atherosclerotic changes without aneurysm. Minimal diameter in the infrarenal segment is estimated at approximately 6 mm. Celiac: Patent. SMA: Patent. Renals: Mild atherosclerotic changes bilaterally, patent. IMA: Moderate stenosis in the proximal segment,  patent. Inflow: A focal mild-to-moderate stenosis is present in the proximal segment of the right common iliac artery, estimated at 50%. The right internal iliac artery is patent. The right external iliac artery is small in caliber with minimal diameter estimated at 4 mm. The left common iliac artery is small in caliber and demonstrates mild diffuse atherosclerotic changes with minimal diameter estimated at 3 mm. The left internal iliac artery is patent. The left external iliac artery is small in caliber with estimated diameter of approximately 3 mm. Proximal Outflow: The right common femoral artery is patent with minimal diameter estimated at 5 mm. The left common femoral artery is patent with minimal diameter estimated at 5 mm. Veins: An IVC filter is present in-situ. A small volume of low-attenuation thrombus material is present within the filter struts and annotated on image 39 of CT series 15. Delayed phase imaging suggests a small linear thrombus or synechiae which extends to the insertion site of the left common iliac vein. The more peripheral pelvic veins are not imaged on delayed phase imaging. Although venous phase imaging was not obtained of the pelvis, the left external iliac vein and left common iliac vein are small in caliber. Review of the MIP images confirms the above findings. NON-VASCULAR Lower chest: Reported separately. Hepatobiliary: No intrahepatic or extrahepatic biliary ductal dilatation. The gallbladder is contracted. Pancreas: Unremarkable. No pancreatic ductal dilatation or surrounding inflammatory changes. Spleen: Normal in size without focal abnormality. Adrenals/Urinary Tract: Adrenal glands are unremarkable. Kidneys are normal, without renal calculi, focal lesion, or hydronephrosis. Urinary bladder is contracted. Stomach/Bowel: No dilated loops of bowel are seen. The appendix is partially visualized and within normal limits. Diverticulosis is present involving the colon. The distal  colon is decompressed, however, there is a suggestion of wall thickening in the left pelvis annotated on image 254 of CT series 4. Which is more prominent when compared to CT scan performed November 23, 2023. Lymphatic: No significant lymphadenopathy is appreciated. Reproductive: Uterus and bilateral adnexa are unremarkable. Other: Postsurgical changes in the left inguinal region are improved. Musculoskeletal: No acute or significant osseous findings. IMPRESSION: 1. Delayed phase imaging of the abdomen was obtained which demonstrates a small focus of thrombus within the IVC filter which is age indeterminate. This is described  in detail above. 2. Improvement in postsurgical changes at the left femoral access site. 3. Wall thickening and mild inflammatory changes are suggested in the left pelvis, also possibly increased from prior. These imaging features may represent sequelae of diverticulitis. Consideration should be given toward correlation with clinical symptoms and further evaluation by GI (to exclude other etiologies) if clinically appropriate. Electronically Signed   By: Maude Naegeli M.D.   On: 02/16/2024 17:36     Assessment/Plan 1. Phantom pain after amputation of lower extremity (HCC) (Primary) Patient continues to struggle with severe phantom pains that is complicating ambulating with her prosthesis.  I will ask Dr. Lazarus to evaluate her for potential further therapies. - Ambulatory referral to Pain Clinic  2. Diverticulitis of colon CT demonstrates chronic diverticulitis with pericolic inflammation as described above.  I will refer to gastroenterology. - Ambulatory referral to Gastroenterology  3. Atherosclerosis of native artery of right lower extremity with intermittent claudication (HCC)  Recommend:  The patient has evidence of atherosclerosis of the lower extremities with claudication of the right lower extremity (she is status post below-knee amputation of the left).  The patient does not  voice lifestyle limiting changes at this point in time.  Noninvasive studies do not suggest clinically significant change.  No invasive studies, angiography or surgery at this time The patient should continue walking and begin a more formal exercise program.  The patient should continue antiplatelet therapy and aggressive treatment of the lipid abnormalities  No changes in the patient's medications at this time  Continued surveillance is indicated as atherosclerosis is likely to progress with time.    The patient will continue follow up with noninvasive studies as ordered.   4. Hx of left BKA (HCC) As noted above I will ask Dr. Lazarus to evaluate.  Additionally I will contact Ozell Mulch at [place] to work on her prosthesis. - Ambulatory referral to Pain Clinic     Cordella Shawl, MD  03/02/2024 3:03 PM

## 2024-03-08 ENCOUNTER — Telehealth (INDEPENDENT_AMBULATORY_CARE_PROVIDER_SITE_OTHER): Payer: Self-pay | Admitting: Nurse Practitioner

## 2024-03-08 ENCOUNTER — Ambulatory Visit (HOSPITAL_BASED_OUTPATIENT_CLINIC_OR_DEPARTMENT_OTHER): Admitting: Student in an Organized Health Care Education/Training Program

## 2024-03-08 ENCOUNTER — Encounter: Payer: Self-pay | Admitting: Student in an Organized Health Care Education/Training Program

## 2024-03-08 ENCOUNTER — Ambulatory Visit
Admission: RE | Admit: 2024-03-08 | Discharge: 2024-03-08 | Disposition: A | Discharge status: Home / Self Care | Source: Ambulatory Visit | Attending: Student in an Organized Health Care Education/Training Program | Admitting: Student in an Organized Health Care Education/Training Program

## 2024-03-08 VITALS — BP 174/96 | HR 90 | Temp 97.9°F | Resp 16 | Ht 65.0 in | Wt 170.0 lb

## 2024-03-08 DIAGNOSIS — G546 Phantom limb syndrome with pain: Secondary | ICD-10-CM | POA: Diagnosis present

## 2024-03-08 DIAGNOSIS — M5134 Other intervertebral disc degeneration, thoracic region: Secondary | ICD-10-CM | POA: Diagnosis present

## 2024-03-08 DIAGNOSIS — M47816 Spondylosis without myelopathy or radiculopathy, lumbar region: Secondary | ICD-10-CM | POA: Insufficient documentation

## 2024-03-08 DIAGNOSIS — G5772 Causalgia of left lower limb: Secondary | ICD-10-CM | POA: Insufficient documentation

## 2024-03-08 DIAGNOSIS — G894 Chronic pain syndrome: Secondary | ICD-10-CM | POA: Insufficient documentation

## 2024-03-08 DIAGNOSIS — Z89512 Acquired absence of left leg below knee: Secondary | ICD-10-CM | POA: Insufficient documentation

## 2024-03-08 NOTE — Telephone Encounter (Signed)
 Has been placed on provider desk

## 2024-03-08 NOTE — Progress Notes (Signed)
 PROVIDER NOTE: Interpretation of information contained herein should be left to medically-trained personnel. Specific patient instructions are provided elsewhere under Patient Instructions section of medical record. This document was created in part using AI and STT-dictation technology, any transcriptional errors that may result from this process are unintentional.  Patient: Penny Chase  Service: E/M Encounter  Provider: Wallie Sherry, MD  DOB: 1971/03/15  Delivery: Face-to-face  Specialty: Interventional Pain Management  MRN: [id number]  Setting: Ambulatory outpatient facility  Specialty designation: 09  Type: New Patient  Location: Outpatient office facility  PCP: [facility]  DOS: 03/08/2024    Referring Prov.: [facility]   Primary Reason(s) for Visit: Encounter for initial evaluation of one or more chronic problems (new to examiner) potentially causing chronic pain, and posing a threat to normal musculoskeletal function. (Level of risk: High) CC: Leg Pain (left)  HPI  Penny Chase is a 53 y.o. year old, female patient, who comes for the first time to our practice referred by [facility], [employer] for our initial evaluation of her chronic pain. She has Carcinoma of upper-outer quadrant of right breast in female, estrogen receptor positive (HCC); Family history of breast cancer; Brief psychotic disorder (HCC); Bipolar affective disorder, current episode manic (HCC); Gastroesophageal reflux disease without esophagitis; HTN (hypertension), benign; Limb ischemia; Gangrene of left foot (HCC); Tobacco abuse; PE (pulmonary thromboembolism) (HCC); GI bleeding; Substance abuse (HCC); Leukocytosis; Blister of left foot; Bloody stools; Rectal bleeding; Acute blood loss anemia; Multiple gastric ulcers; Ischemic colitis (HCC); Symptomatic anemia; Hx of left BKA (HCC); Pain; Phantom limb syndrome with pain (HCC); Diverticulitis of colon; Atherosclerosis of native arteries of  extremity with intermittent claudication (HCC); Lumbar spondylosis; and Complex regional pain syndrome type 2 of left lower extremity on their problem list. Today she comes in for evaluation of her Leg Pain (left)  Pain Assessment: Location: Left, Lower Leg Radiating:   Onset: More than a month ago Duration: Phantom pain Quality: Pins and needles Severity: 7 /10 (subjective, self-reported pain score)  Effect on ADL: difficulty performing daily activities Timing: Intermittent Modifying factors: medications, positioning BP: (!) 174/96  HR: 90  Onset and Duration: Sudden and Present longer than 3 months Cause of pain: Surgery Severity: No change since onset, NAS-11 at its worse: 8/10, NAS-11 at its best: 4/10, and NAS-11 now: 7/10 Timing: Morning and During activity or exercise Aggravating Factors: Bending and Lifiting Alleviating Factors: Cold packs, Lying down, and Resting Associated Problems: Depression, Dizziness, Fatigue, Nausea, Numbness, Spasms, Sweating, Pain that wakes patient up, and Pain that does not allow patient to sleep Quality of Pain: Aching, Burning, Cramping, Pressure-like, Pulsating, Sharp, Shooting, Stabbing, Tender, Throbbing, and Tingling Previous Examinations or Tests: CT scan and X-rays Previous Treatments: The patient denies any previous treatments  Penny Chase is being evaluated for possible interventional pain management therapies for the treatment of her chronic pain.  Discussed the use of AI scribe software for clinical note transcription with the patient, who gave verbal consent to proceed.  History of Present Illness   Penny Chase is a 53 year old female with a left below the knee amputation who presents with phantom limb pain. She was referred by Dr. Dreama for evaluation of her phantom limb pain following a left below the knee amputation.  She underwent a left below the knee amputation on April 18th due to blood clots that could not be fully removed,  resulting in necrosis of the foot. She experiences significant phantom limb pain, described  as running from her leg to her toes.  Differential also includes CRPS.  For pain management, she is currently taking Celebrex, Tylenol , and gabapentin  at a dose of 600 mg three times a day. She is also on Effexor , which she notes can help with nerve pain.  She has arthritis in her lower back and experiences spasms affecting both legs. She also has bursitis in both hips, for which cortisone injections were previously tried but did not provide relief.  No use of blood thinners except for aspirin  at a dose of one milligram. She experiences spasms and pain in her right leg as well.      Historical Monitoring: The patient  reports that she does not currently use drugs after having used the following drugs: Marijuana. List of prior UDS Testing: Lab Results  Component Value Date   MDMA NONE DETECTED 01/01/2021   COCAINSCRNUR NONE DETECTED 01/01/2021   PCPSCRNUR NONE DETECTED 01/01/2021   THCU POSITIVE (A) 01/01/2021   ETH <10 01/01/2021   Historical Background Evaluation: [place] PMP: PDMP not reviewed this encounter. Review of the past 1-months conducted.              [place] Department of public safety, offender search: Engineer, mining Information) Non-contributory Risk Assessment Profile: Aberrant behavior: None observed or detected today Risk factors for fatal opioid overdose: None identified today Fatal overdose hazard ratio (HR): Calculation deferred Non-fatal overdose hazard ratio (HR): Calculation deferred Risk of opioid abuse or dependence: 0.7-3.0% with doses <= 36 MME/day and 6.1-26% with doses >= 120 MME/day. Substance use disorder (SUD) risk level: Moderate   Pharmacologic Plan: Non-opioid analgesic therapy offered.              Meds   Current Outpatient Medications:    acetaminophen  (TYLENOL ) 500 MG tablet, Take 500 mg by mouth every 6 (six) hours as needed for moderate pain (pain score 4-6).,  Disp: , Rfl:    celecoxib (CELEBREX) 100 MG capsule, Take 100 mg by mouth 2 (two) times daily., Disp: , Rfl:    cyanocobalamin  1000 MCG tablet, Take 1 tablet (1,000 mcg total) by mouth daily., Disp: 30 tablet, Rfl: 2   cyclobenzaprine  (FLEXERIL ) 5 MG tablet, Take 1 tablet (5 mg total) by mouth 3 (three) times daily as needed for muscle spasms., Disp: 30 tablet, Rfl: 0   folic acid  (FOLVITE ) 1 MG tablet, Take 1 tablet (1 mg total) by mouth daily., Disp: 30 tablet, Rfl: 0   gabapentin  (NEURONTIN ) 600 MG tablet, Take 1 tablet (600 mg total) by mouth 3 (three) times daily., Disp: 90 tablet, Rfl: 0   iron  polysaccharides (NIFEREX) 150 MG capsule, Take 1 capsule (150 mg total) by mouth daily., Disp: 30 capsule, Rfl: 0   oxyCODONE  (OXY IR/ROXICODONE ) 5 MG immediate release tablet, Take 1 tablet (5 mg total) by mouth every 6 (six) hours as needed for severe pain (pain score 7-10)., Disp: 46 tablet, Rfl: 0   pantoprazole  (PROTONIX ) 40 MG tablet, Take 1 tablet (40 mg total) by mouth 2 (two) times daily before a meal., Disp: 60 tablet, Rfl: 1   pravastatin  (PRAVACHOL ) 10 MG tablet, Take 1 tablet by mouth daily., Disp: , Rfl:    venlafaxine  XR (EFFEXOR -XR) 37.5 MG 24 hr capsule, Take 1 capsule (37.5 mg total) by mouth daily with breakfast., Disp: 60 capsule, Rfl: 1  Imaging Review  X-ray lumbar spine 4 plus views  Anatomical Region Laterality Modality  Spine Lumbar -- Radiographic Imaging  Spine Thoracic -- --  Pelvis -- --  ORTHO Spine Lumbar -- --   Narrative  Anteroposterior and lateral views of the lumbar spine were obtained.   Images reveal no scoliosis.  Mild degenerative changes noted throughout the lumbar spine with mild facet arthropathy present at L3-L4 and L4-L5.  No fractures.  No listhesis.  DG Foot Complete Left  Narrative CLINICAL DATA:  Recent arterial occlusion of left lower extremity. Complicated by apparent gangrene. New cluster development. Evaluate for underlying gas. Toes  blistered and black.  EXAM: LEFT FOOT - COMPLETE 3+ VIEW  COMPARISON:  Left calcaneus radiographs 12/01/2020  FINDINGS: There is a moderate plantar calcaneal heel spur, unchanged. Minimal chronic enthesopathic change at the Achilles insertion on the calcaneus. Mild distal anterior tibial plafond and and adjacent talar dome-neck junction degenerative osteophytosis. 4 mm ossicle posterior to the talus on lateral view is unchanged from prior and chronic. Additional adjacent 2 mm ossicle not definitely previously seen.  Mild posterior dorsal navicular degenerative spurring. Well corticated ossicle measuring up to 8 mm just distal to the fibula.  No acute fracture dislocation.  IMPRESSION: 1. Moderate plantar calcaneal heel spur, unchanged. 2. Mild distal anterior tibial plafond and adjacent talar dome-neck junction degenerative osteophytosis. 3. Well corticated, chronic ossicle measuring up to 8 mm just distal to the fibula, likely the sequela of remote trauma.   Electronically Signed By: Tanda Lyons M.D. On: 11/23/2023 10:59  Complexity Note: Imaging results reviewed.                         ROS  Cardiovascular: High blood pressure and Chest pain Pulmonary or Respiratory: Smoking Neurological: No reported neurological signs or symptoms such as seizures, abnormal skin sensations, urinary and/or fecal incontinence, being born with an abnormal open spine and/or a tethered spinal cord Psychological-Psychiatric: Anxiousness, Depressed, and Prone to panicking Gastrointestinal: Vomiting blood (Ulcers) and Reflux or heatburn Genitourinary: No reported renal or genitourinary signs or symptoms such as difficulty voiding or producing urine, peeing blood, non-functioning kidney, kidney stones, difficulty emptying the bladder, difficulty controlling the flow of urine, or chronic kidney disease Hematological: Weakness due to low blood hemoglobin or red blood cell count  (Anemia) Endocrine: No reported endocrine signs or symptoms such as high or low blood sugar, rapid heart rate due to high thyroid  levels, obesity or weight gain due to slow thyroid  or thyroid  disease Rheumatologic: Rheumatoid arthritis and Generalized muscle aches (Fibromyalgia) Musculoskeletal: Negative for myasthenia gravis, muscular dystrophy, multiple sclerosis or malignant hyperthermia Work History: Disabled  Allergies  Penny Chase is allergic to amlodipine .  Laboratory Chemistry Profile   Renal Lab Results  Component Value Date   BUN 13 02/14/2024   CREATININE 0.85 02/14/2024   GFRAA >60 05/31/2016   GFRNONAA >60 02/14/2024   PROTEINUR NEGATIVE 09/20/2018     Electrolytes Lab Results  Component Value Date   NA 135 02/14/2024   K 4.3 02/14/2024   CL 98 02/14/2024   CALCIUM  9.7 02/14/2024   MG 2.2 11/25/2023     Hepatic Lab Results  Component Value Date   AST 28 11/24/2023   ALT 27 11/24/2023   ALBUMIN 2.5 (L) 11/24/2023   ALKPHOS 97 11/24/2023   LIPASE 30 05/31/2016     ID Lab Results  Component Value Date   HIV Non Reactive 11/23/2023   SARSCOV2NAA NEGATIVE 01/01/2021   PREGTESTUR NEGATIVE 01/01/2021     Bone Lab Results  Component Value Date   VD25OH 14.79 (L) 10/31/2020     Endocrine Lab  Results  Component Value Date   GLUCOSE 105 (H) 02/14/2024   GLUCOSEU NEGATIVE 09/20/2018   HGBA1C 5.3 01/07/2021   TSH 2.667 01/07/2021     Neuropathy Lab Results  Component Value Date   VITAMINB12 238 11/26/2023   FOLATE 3.7 (L) 11/26/2023   HGBA1C 5.3 01/07/2021   HIV Non Reactive 11/23/2023     CNS No results found for: COLORCSF, APPEARCSF, RBCCOUNTCSF, WBCCSF, POLYSCSF, LYMPHSCSF, EOSCSF, PROTEINCSF, GLUCCSF, JCVIRUS, CSFOLI, IGGCSF, LABACHR, ACETBL   Inflammation (CRP: Acute  ESR: Chronic) Lab Results  Component Value Date   LATICACIDVEN 1.4 11/23/2023     Rheumatology No results found for: RF, ANA, LABURIC,  URICUR, LYMEIGGIGMAB, LYMEABIGMQN, HLAB27   Coagulation Lab Results  Component Value Date   INR 1.1 11/23/2023   LABPROT 14.8 11/23/2023   APTT 90 (H) 11/28/2023   PLT 417 (H) 02/14/2024   AT3 114 11/30/2023     Cardiovascular Lab Results  Component Value Date   TROPONINI <0.03 05/31/2016   HGB 14.0 02/14/2024   HCT 44.3 02/14/2024     Screening Lab Results  Component Value Date   SARSCOV2NAA NEGATIVE 01/01/2021   HIV Non Reactive 11/23/2023   PREGTESTUR NEGATIVE 01/01/2021     Cancer No results found for: CEA, CA125, LABCA2   Allergens No results found for: ALMOND, APPLE, ASPARAGUS, AVOCADO, BANANA, BARLEY, BASIL, BAYLEAF, GREENBEAN, LIMABEAN, WHITEBEAN, BEEFIGE, REDBEET, BLUEBERRY, BROCCOLI, CABBAGE, MELON, CARROT, CASEIN, CASHEWNUT, CAULIFLOWER, CELERY     Note: Lab results reviewed.  PFSH  Drug: Penny Chase  reports that she does not currently use drugs after having used the following drugs: Marijuana. Alcohol:  reports that she does not currently use alcohol. Tobacco:  reports that she has been smoking cigarettes. She has a 15 pack-year smoking history. She has never used smokeless tobacco. Medical:  has a past medical history of Acute lower limb ischemia, Anemia, Breast cancer (HCC), Cancer (HCC) (09/2018), COVID-19 (11/2018), Family history of breast cancer, GERD (gastroesophageal reflux disease), Hypertension, Personal history of radiation therapy (2020), and Pulmonary embolism (HCC). Family: family history includes Atrial fibrillation in her father; Breast cancer in her paternal aunt; Diabetes in her mother.  Past Surgical History:  Procedure Laterality Date   AMPUTATION Left 11/26/2023   Procedure: AMPUTATION BELOW KNEE;  Surgeon: Jama Cordella MATSU, MD;  Location: [facility] ORS;  Service: Vascular;  Laterality: Left;   BREAST BIOPSY Right 09/12/2018   us  core/venus clip-INVASIVE MAMMARYLobular CARCINOMA,  WITH LOBULAR FEATURES   BREAST LUMPECTOMY Right 10/04/2018   BREAST LUMPECTOMY WITH NEEDLE LOCALIZATION AND AXILLARY SENTINEL LYMPH NODE BX Right 10/04/2018   Procedure: BREAST LUMPECTOMY WITH NEEDLE LOCALIZATION AND AXILLARY SENTINEL LYMPH NODE BX;  Surgeon: Rodolph Romano, MD;  Location: [facility] ORS;  Service: General;  Laterality: Right;   COLONOSCOPY N/A 11/28/2023   Procedure: COLONOSCOPY;  Surgeon: Jinny Carmine, MD;  Location: [facility] END[facility];  Service: Endoscopy;  Laterality: N/A;   ESOPHAGOGASTRODUODENOSCOPY N/A 11/28/2023   Procedure: EGD (ESOPHAGOGASTRODUODENOSCOPY);  Surgeon: Jinny Carmine, MD;  Location: [facility];  Service: Endoscopy;  Laterality: N/A;   GIVENS CAPSULE STUDY N/A 11/28/2023   Procedure: IMAGING PROCEDURE, GI TRACT, INTRALUMINAL, VIA CAPSULE;  Surgeon: Jinny Carmine, MD;  Location: [facility] ENDOSCOPY;  Service: Endoscopy;  Laterality: N/A;   IVC FILTER INSERTION N/A 11/28/2023   Procedure: IVC FILTER INSERTION;  Surgeon: Jama Cordella MATSU, MD;  Location: [facility] INVASIVE CV LAB;  Service: Cardiovascular;  Laterality: N/A;   LOWER EXTREMITY ANGIOGRAPHY Left 11/24/2023   Procedure: Lower Extremity Angiography;  Surgeon: Jama,  Cordella MATSU, MD;  Location: [facility] INVASIVE CV LAB;  Service: Cardiovascular;  Laterality: Left;   TONSILLECTOMY     Active Ambulatory Problems    Diagnosis Date Noted   Carcinoma of upper-outer quadrant of right breast in female, estrogen receptor positive (HCC) 09/20/2018   Family history of breast cancer    Brief psychotic disorder (HCC) 01/03/2021   Bipolar affective disorder, current episode manic (HCC) 01/06/2021   Gastroesophageal reflux disease without esophagitis 01/04/2017   HTN (hypertension), benign 01/20/2014   Limb ischemia 11/23/2023   Gangrene of left foot (HCC) 11/23/2023   Tobacco abuse 11/23/2023   PE (pulmonary thromboembolism) (HCC) 11/23/2023   GI bleeding 11/23/2023   Substance abuse (HCC) 11/23/2023   Leukocytosis  11/23/2023   Blister of left foot 11/24/2023   Bloody stools 11/26/2023   Rectal bleeding 11/26/2023   Acute blood loss anemia 11/26/2023   Multiple gastric ulcers 11/28/2023   Ischemic colitis (HCC) 11/28/2023   Symptomatic anemia 12/01/2023   Hx of left BKA (HCC) 01/25/2024   Pain 02/13/2024   Phantom limb syndrome with pain (HCC) 03/01/2024   Diverticulitis of colon 03/01/2024   Atherosclerosis of native arteries of extremity with intermittent claudication (HCC) 03/02/2024   Lumbar spondylosis 03/08/2024   Complex regional pain syndrome type 2 of left lower extremity 03/08/2024   Resolved Ambulatory Problems    Diagnosis Date Noted   Psychosis (HCC) 01/01/2021   Psychosis (HCC) 01/06/2021   Past Medical History:  Diagnosis Date   Acute lower limb ischemia    Anemia    Breast cancer (HCC)    Cancer (HCC) 09/2018   COVID-19 11/2018   GERD (gastroesophageal reflux disease)    Hypertension    Personal history of radiation therapy 2020   Pulmonary embolism (HCC)    Constitutional Exam  General appearance: Well nourished, well developed, and well hydrated. In no apparent acute distress Vitals:   03/08/24 1302 03/08/24 1304  BP: (!) 173/105 (!) 174/96  Pulse: 90   Resp: 16   Temp: 97.9 F (36.6 C)   TempSrc: Temporal   SpO2: 98%   Weight: 170 lb (77.1 kg)   Height: 5' 5 (1.651 m)    BMI Assessment: Estimated body mass index is 28.29 kg/m as calculated from the following:   Height as of this encounter: 5' 5 (1.651 m).   Weight as of this encounter: 170 lb (77.1 kg).  BMI interpretation table: BMI level Category Range association with higher incidence of chronic pain  <18 kg/m2 Underweight   18.5-24.9 kg/m2 Ideal body weight   25-29.9 kg/m2 Overweight Increased incidence by 20%  30-34.9 kg/m2 Obese (Class I) Increased incidence by 68%  35-39.9 kg/m2 Severe obesity (Class II) Increased incidence by 136%  >40 kg/m2 Extreme obesity (Class III) Increased incidence  by 254%   Patient's current BMI Ideal Body weight  Body mass index is 28.29 kg/m. Ideal body weight: 57 kg (125 lb 10.6 oz) Adjusted ideal body weight: 65 kg (143 lb 6.4 oz)   BMI Readings from Last 4 Encounters:  03/08/24 28.29 kg/m  03/01/24 29.91 kg/m  02/14/24 28.80 kg/m  12/16/23 30.55 kg/m   Wt Readings from Last 4 Encounters:  03/08/24 170 lb (77.1 kg)  03/01/24 174 lb 4 oz (79 kg)  02/14/24 167 lb 12.8 oz (76.1 kg)  12/16/23 178 lb (80.7 kg)    Psych/Mental status: Alert, oriented x 3 (person, place, & time)       Eyes: PERLA Respiratory: No evidence of  acute respiratory distress  Lumbar Spine Area Exam  Skin & Axial Inspection: No masses, redness, or swelling Alignment: Symmetrical Functional ROM: Pain restricted ROM       Stability: No instability detected Muscle Tone/Strength: Functionally intact. No obvious neuro-muscular anomalies detected. Sensory (Neurological): Musculoskeletal pain pattern Palpation: Complains of area being tender to palpation       Provocative Tests:  Lumbar quadrant test (Kemp's test): (+) bilaterally for facet joint pain.  Gait & Posture Assessment  Ambulation: Unassisted Gait: Relatively normal for age and body habitus Posture: WNL  Lower Extremity Exam    Side: Right lower extremity  Side: Left lower extremity  Stability: No instability observed          Stability: No instability observed          Skin & Extremity Inspection: Skin color, temperature, and hair growth are WNL. No peripheral edema or cyanosis. No masses, redness, swelling, asymmetry, or associated skin lesions. No contractures.  Skin & Extremity Inspection: Below knee amputation (BKA)  Functional ROM: Unrestricted ROM                  Functional ROM: Unrestricted ROM                  Muscle Tone/Strength: Functionally intact. No obvious neuro-muscular anomalies detected.  Muscle Tone/Strength: left BKA  Sensory (Neurological): Unimpaired        Sensory  (Neurological): Neurogenic pain pattern        DTR: Patellar: deferred today Achilles: deferred today Plantar: deferred today  DTR: Patellar: deferred today Achilles: deferred today Plantar: deferred today  Palpation: No palpable anomalies  Palpation: No palpable anomalies    Assessment  Primary Diagnosis & Pertinent Problem List: The primary encounter diagnosis was Lumbar facet arthropathy. Diagnoses of Lumbar spondylosis, Hx of left BKA (HCC), Phantom limb syndrome with pain (HCC), Complex regional pain syndrome type 2 of left lower extremity, Other intervertebral disc degeneration, thoracic region, and Chronic pain syndrome were also pertinent to this visit.  Visit Diagnosis (New problems to examiner): 1. Lumbar facet arthropathy   2. Lumbar spondylosis   3. Hx of left BKA (HCC)   4. Phantom limb syndrome with pain (HCC)   5. Complex regional pain syndrome type 2 of left lower extremity   6. Other intervertebral disc degeneration, thoracic region   7. Chronic pain syndrome    Plan of Care (Initial workup plan)  Note: Penny Chase was reminded that as per protocol, today's visit has been an evaluation only. We have not taken over the patient's controlled substance management.  Problem-specific plan: Assessment and Plan    Phantom limb pain   She experiences chronic phantom limb pain following a left below-knee amputation, described as running from the leg to the toes. Continue current pain management with Celebrex, Tylenol , gabapentin , and Effexor .  Given the patient's persistent and severe phantom limb pain and limited response to conservative and interventional therapies, a spinal cord stimulator (SCS) trial is being considered. While SCS is typically more effective for neuropathic and appendicular pain, we discussed that some patients may also experience relief in axial low back and hip pain. The potential benefits and risks of spinal cord stimulation were thoroughly  reviewed.  The proposed plan includes a percutaneous spinal cord stimulator trial. The patient was informed that this will involve temporary placement of epidural leads connected to an external pulse generator, which will be used over a 7-day trial period. We discussed the possibility of a  mid-trial in-office visit to adjust settings and optimize programming in order to give the patient the best chance of success. The patient will receive daily support from the device representative throughout the trial.  We reviewed the benefits of SCS, which include potential substantial pain relief, reduction in the use of oral pain medications including opioids, and long-term programmable therapy that can reduce reliance on repeated injections and other pain interventions. Risks were also discussed in detail and include potential surgical complications such as infection, bleeding, CSF leak, lead migration or fracture, hardware malfunction, and the possibility of either no pain relief or worsening of symptoms. The patient was advised that spinal cord stimulation is not a guaranteed solution or a "magic bullet," but rather a potentially valuable therapy in appropriately selected cases.  As part of standard protocol, the patient will require a comprehensive psychosocial and behavioral evaluation prior to the trial. A referral was placed to [facility], and the patient was instructed to have the results faxed to our clinic prior to scheduling the procedure. We had a thorough and detailed discussion reviewing the rationale, alternatives, risks, and expected outcomes. The patient stated that all questions were answered to their satisfaction, demonstrated appropriate understanding, and expressed readiness to proceed. There were no barriers to understanding the treatment plan, and the explanation was well received. The patient is eager to move forward with the spinal cord stimulator trial once the necessary evaluation is  complete.   Lumbar facet arthropathy Penny Chase has a history of greater than 3 months of moderate to severe pain which is resulted in functional impairment.  The patient has tried various conservative therapeutic options such as NSAIDs, Tylenol , muscle relaxants, physical therapy which was inadequately effective.  Patient's pain is predominantly axial with physical exam and Xray findings suggestive of facet arthropathy. Lumbar facet medial branch nerve blocks were discussed with the patient.  Risks and benefits were reviewed.  Patient would like to proceed with bilateral L3, L4, L5 medial branch nerve block.      1. Lumbar facet arthropathy (Primary) - LUMBAR FACET(MEDIAL BRANCH NERVE BLOCK) MBNB; Future - DG Lumbar Spine Complete W/Bend; Future - MR THORACIC SPINE WO CONTRAST; Future - MR LUMBAR SPINE WO CONTRAST; Future - Ambulatory referral to Psychology  2. Lumbar spondylosis - LUMBAR FACET(MEDIAL BRANCH NERVE BLOCK) MBNB; Future - DG Lumbar Spine Complete W/Bend; Future - MR THORACIC SPINE WO CONTRAST; Future - MR LUMBAR SPINE WO CONTRAST; Future  3. Hx of left BKA (HCC)  4. Phantom limb syndrome with pain (HCC) - MR THORACIC SPINE WO CONTRAST; Future  5. Complex regional pain syndrome type 2 of left lower extremity  6. Other intervertebral disc degeneration, thoracic region - MR THORACIC SPINE WO CONTRAST; Future  7. Chronic pain syndrome - Ambulatory referral to Psychology    Imaging Orders         DG Lumbar Spine Complete W/Bend         MR THORACIC SPINE WO CONTRAST         MR LUMBAR SPINE WO CONTRAST         DG PAIN CLINIC C-ARM 1-60 MIN NO REPORT    Referral Orders         Ambulatory referral to Psychology    Procedure Orders         LUMBAR FACET(MEDIAL BRANCH NERVE BLOCK) MBNB       Provider-requested follow-up: Return in about 25 days (around 04/02/2024) for B/L L3, 4, 5 MBNB, in clinic IV Versed .  Future Appointments  Date Time Provider Department  Center  03/23/2024 10:30 AM [facility] VASC 1 [facility] None  03/23/2024 11:00 AM [facility] 1 [facility] None  03/23/2024 11:30 AM Delores Orvin BRAVO, NP [facility] None  05/15/2024  2:15 PM [facility] None  05/15/2024  2:30 PM Rennie Cindy SAUNDERS, MD [facility] None  05/15/2024  3:00 PM [facility] INFUSION CHAIR 11 [facility] None   I discussed the assessment and treatment plan with the patient. The patient was provided an opportunity to ask questions and all were answered. The patient agreed with the plan and demonstrated an understanding of the instructions.  Patient advised to call back or seek an in-person evaluation if the symptoms or condition worsens.  Duration of encounter: .  Total time on encounter, as per AMA guidelines included both the face-to-face and non-face-to-face time personally spent by the physician and/or other qualified health care professional(s) on the day of the encounter (includes time in activities that require the physician or other qualified health care professional and does not include time in activities normally performed by clinical staff). Physician's time may include the following activities when performed: Preparing to see the patient (e.g., pre-charting review of records, searching for previously ordered imaging, lab work, and nerve conduction tests) Review of prior analgesic pharmacotherapies. Reviewing PMP Interpreting ordered tests (e.g., lab work, imaging, nerve conduction tests) Performing post-procedure evaluations, including interpretation of diagnostic procedures Obtaining and/or reviewing separately obtained history Performing a medically appropriate examination and/or evaluation Counseling and educating the patient/family/caregiver Ordering medications, tests, or procedures Referring and communicating with other health care professionals (when not separately reported) Documenting clinical information in the electronic or other health  record Independently interpreting results (not separately reported) and communicating results to the patient/ family/caregiver Care coordination (not separately reported)  Note by: Wallie Sherry, MD (TTS and AI technology used. I apologize for any typographical errors that were not detected and corrected.) Date: 03/08/2024; Time: 2:37 PM

## 2024-03-08 NOTE — Patient Instructions (Addendum)
 Preparing for Procedure with Sedation Instructions: Oral Intake: Do not eat or drink anything for at least 8 hours prior to your procedure. Transportation: Public transportation is not allowed. Bring an adult driver. The driver must be physically present in our waiting room before any procedure can be started. Physical Assistance: Bring an adult capable of physically assisting you, in the event you need help. Blood Pressure Medicine: Take your blood pressure medicine with a sip of water the morning of the procedure. Insulin: Take only  of your normal insulin dose. Preventing infections: Shower with an antibacterial soap the morning of your procedure. Build-up your immune system: Take 1000 mg of Vitamin C with every meal (3 times a day) the day prior to your procedure. Pregnancy: If you are pregnant, call and cancel the procedure. Sickness: If you have a cold, fever, or any active infections, call and cancel the procedure. Arrival: You must be in the facility at least 30 minutes prior to your scheduled procedure. Children: Do not bring children with you. Dress appropriately: Bring dark clothing that you would not mind if they get stained. Valuables: Do not bring any jewelry or valuables. Procedure appointments are reserved for interventional treatments only. No Prescription Refills. No medication changes will be discussed during procedure appointments. No disability issues will be discussed. [facility] brochure T + L MRI, xrays lumbar spine Follow up in 3 weeks for B/L L3,4,5 MBNB

## 2024-03-08 NOTE — Progress Notes (Signed)
 Safety precautions to be maintained throughout the outpatient stay will include: orient to surroundings, keep bed in low position, maintain call bell within reach at all times, provide assistance with transfer out of bed and ambulation.

## 2024-03-08 NOTE — Telephone Encounter (Signed)
 Patient spouse brought in RX form from [facility] that they need signed/completed and faxed back over to [facility]. Paper placed in nurse box.

## 2024-03-22 ENCOUNTER — Ambulatory Visit
Admission: RE | Admit: 2024-03-22 | Discharge: 2024-03-22 | Disposition: A | Discharge status: Home / Self Care | Source: Ambulatory Visit | Attending: Student in an Organized Health Care Education/Training Program | Admitting: Student in an Organized Health Care Education/Training Program

## 2024-03-22 ENCOUNTER — Other Ambulatory Visit (INDEPENDENT_AMBULATORY_CARE_PROVIDER_SITE_OTHER): Payer: Self-pay | Admitting: Vascular Surgery

## 2024-03-22 ENCOUNTER — Ambulatory Visit
Admission: RE | Admit: 2024-03-22 | Discharge: 2024-03-22 | Discharge status: Home / Self Care | Source: Ambulatory Visit | Attending: Student in an Organized Health Care Education/Training Program | Admitting: Student in an Organized Health Care Education/Training Program

## 2024-03-22 DIAGNOSIS — M47816 Spondylosis without myelopathy or radiculopathy, lumbar region: Secondary | ICD-10-CM | POA: Diagnosis present

## 2024-03-22 DIAGNOSIS — G546 Phantom limb syndrome with pain: Secondary | ICD-10-CM

## 2024-03-22 DIAGNOSIS — M5134 Other intervertebral disc degeneration, thoracic region: Secondary | ICD-10-CM | POA: Diagnosis present

## 2024-03-22 DIAGNOSIS — I70211 Atherosclerosis of native arteries of extremities with intermittent claudication, right leg: Secondary | ICD-10-CM

## 2024-03-23 ENCOUNTER — Encounter (INDEPENDENT_AMBULATORY_CARE_PROVIDER_SITE_OTHER): Payer: Self-pay | Admitting: Nurse Practitioner

## 2024-03-23 ENCOUNTER — Ambulatory Visit (INDEPENDENT_AMBULATORY_CARE_PROVIDER_SITE_OTHER): Admitting: Nurse Practitioner

## 2024-03-23 ENCOUNTER — Ambulatory Visit (INDEPENDENT_AMBULATORY_CARE_PROVIDER_SITE_OTHER)

## 2024-03-23 VITALS — BP 131/84 | HR 76 | Resp 18

## 2024-03-23 DIAGNOSIS — I70211 Atherosclerosis of native arteries of extremities with intermittent claudication, right leg: Secondary | ICD-10-CM

## 2024-03-23 DIAGNOSIS — Z89512 Acquired absence of left leg below knee: Secondary | ICD-10-CM

## 2024-03-23 DIAGNOSIS — K259 Gastric ulcer, unspecified as acute or chronic, without hemorrhage or perforation: Secondary | ICD-10-CM | POA: Diagnosis not present

## 2024-03-27 ENCOUNTER — Encounter (INDEPENDENT_AMBULATORY_CARE_PROVIDER_SITE_OTHER): Payer: Self-pay | Admitting: Nurse Practitioner

## 2024-03-27 NOTE — Progress Notes (Signed)
 Subjective:    Patient ID: Penny Chase, female    DOB: [birth year], 53 y.o.   MRN: [id number] Chief Complaint  Patient presents with   Follow-up    Pt conv abi and right le arterial     The patient presents today for follow-up evaluation after left below-knee amputation on 11/26/2023.  She returns today for noninvasive studies.  Currently she denies any open wounds or ulcerations.  She notes that she has been fitted for her prosthetic and hopefully should be receiving it soon.  Today her noninvasive studies show a right ABI 1.06 and she has largely triphasic waveforms throughout the right lower extremity with biphasic tibial waveforms.  However she has a reduced TBI with dampened toe waveforms noted.    Review of Systems  Neurological:  Positive for weakness.  All other systems reviewed and are negative.      Objective:   Physical Exam Vitals reviewed.  HENT:     Head: Normocephalic.  Cardiovascular:     Rate and Rhythm: Normal rate.  Pulmonary:     Effort: Pulmonary effort is normal.  Skin:    General: Skin is warm.  Neurological:     Mental Status: She is alert and oriented to person, place, and time.  Psychiatric:        Mood and Affect: Mood normal.        Behavior: Behavior normal.        Thought Content: Thought content normal.        Judgment: Judgment normal.     BP 131/84   Pulse 76   Resp 18   LMP 09/20/2018 (Exact Date)   Past Medical History:  Diagnosis Date   Acute lower limb ischemia    Anemia    Breast cancer (HCC)    Cancer (HCC) 09/2018   Right breast   COVID-19 11/2018   Family history of breast cancer    GERD (gastroesophageal reflux disease)    Hypertension    Personal history of radiation therapy 2020   Rt breast   Pulmonary embolism (HCC)     Social History   Socioeconomic History   Marital status: Married    Spouse name: Not on file   Number of children: Not on file   Years of education: Not on file   Highest education  level: Not on file  Occupational History   Not on file  Tobacco Use   Smoking status: Every Day    Current packs/day: 1.00    Average packs/day: 1 pack/day for 15.0 years (15.0 ttl pk-yrs)    Types: Cigarettes   Smokeless tobacco: Never   Tobacco comments:    patient declined  Vaping Use   Vaping status: Former  Substance and Sexual Activity   Alcohol use: Not Currently    Comment: not taken for about 6 weeks   Drug use: Not Currently    Types: Marijuana    Comment: Uses street opioids   Sexual activity: Not Currently  Other Topics Concern   Not on file  Social History Narrative   Works at [employer];  Smoke 15 cig/day; No alcohol abuse; in [employer]; with husband.    Social Drivers of Corporate investment banker Strain: Low Risk  (11/15/2023)   [facility]   Overall Financial Resource Strain (CARDIA)    Difficulty of Paying Living Expenses: Not very hard  Food Insecurity: No Food Insecurity (02/14/2024)   Hunger Vital Sign  Worried About Programme researcher, broadcasting/film/video in the Last Year: Never true    Ran Out of Food in the Last Year: Never true  Transportation Needs: No Transportation Needs (02/14/2024)   PRAPARE - Administrator, Civil Service (Medical): No    Lack of Transportation (Non-Medical): No  Physical Activity: Not on file  Stress: Not on file  Social Connections: Not on file  Intimate Partner Violence: Not At Risk (02/14/2024)   Humiliation, Afraid, Rape, and Kick questionnaire    Fear of Current or Ex-Partner: No    Emotionally Abused: No    Physically Abused: No    Sexually Abused: No    Past Surgical History:  Procedure Laterality Date   AMPUTATION Left 11/26/2023   Procedure: AMPUTATION BELOW KNEE;  Surgeon: Jama Cordella MATSU, MD;  Location: [facility] ORS;  Service: Vascular;  Laterality: Left;   BREAST BIOPSY Right 09/12/2018   us  core/venus clip-INVASIVE MAMMARYLobular CARCINOMA, WITH LOBULAR FEATURES   BREAST  LUMPECTOMY Right 10/04/2018   BREAST LUMPECTOMY WITH NEEDLE LOCALIZATION AND AXILLARY SENTINEL LYMPH NODE BX Right 10/04/2018   Procedure: BREAST LUMPECTOMY WITH NEEDLE LOCALIZATION AND AXILLARY SENTINEL LYMPH NODE BX;  Surgeon: Rodolph Romano, MD;  Location: [facility] ORS;  Service: General;  Laterality: Right;   COLONOSCOPY N/A 11/28/2023   Procedure: COLONOSCOPY;  Surgeon: Jinny Carmine, MD;  Location: [facility] ENDOSCOPY;  Service: Endoscopy;  Laterality: N/A;   ESOPHAGOGASTRODUODENOSCOPY N/A 11/28/2023   Procedure: EGD (ESOPHAGOGASTRODUODENOSCOPY);  Surgeon: Jinny Carmine, MD;  Location: [facility] ENDOSCOPY;  Service: Endoscopy;  Laterality: N/A;   GIVENS CAPSULE STUDY N/A 11/28/2023   Procedure: IMAGING PROCEDURE, GI TRACT, INTRALUMINAL, VIA CAPSULE;  Surgeon: Jinny Carmine, MD;  Location: [facility] ENDOSCOPY;  Service: Endoscopy;  Laterality: N/A;   IVC FILTER INSERTION N/A 11/28/2023   Procedure: IVC FILTER INSERTION;  Surgeon: Jama Cordella MATSU, MD;  Location: [facility] INVASIVE CV LAB;  Service: Cardiovascular;  Laterality: N/A;   LOWER EXTREMITY ANGIOGRAPHY Left 11/24/2023   Procedure: Lower Extremity Angiography;  Surgeon: Jama Cordella MATSU, MD;  Location: [facility] INVASIVE CV LAB;  Service: Cardiovascular;  Laterality: Left;   TONSILLECTOMY      Family History  Problem Relation Age of Onset   Diabetes Mother    Atrial fibrillation Father    Breast cancer Paternal Aunt     Allergies  Allergen Reactions   Amlodipine  Swelling       Latest Ref Rng & Units 02/14/2024    1:28 PM 01/17/2024    1:12 PM 12/16/2023    1:20 PM  CBC  WBC 4.0 - 10.5 K/uL 10.8  11.8  16.0   Hemoglobin 12.0 - 15.0 g/dL 85.9  87.4  8.0   Hematocrit 36.0 - 46.0 % 44.3  41.3  25.6   Platelets 150 - 400 K/uL 417  442  558       CMP     Component Value Date/Time   NA 135 02/14/2024 1328   K 4.3 02/14/2024 1328   CL 98 02/14/2024 1328   CO2 27 02/14/2024 1328   GLUCOSE 105 (H) 02/14/2024 1328   BUN 13 02/14/2024 1328    CREATININE 0.85 02/14/2024 1328   CALCIUM  9.7 02/14/2024 1328   PROT 6.6 11/24/2023 0254   ALBUMIN 2.5 (L) 11/24/2023 0254   AST 28 11/24/2023 0254   ALT 27 11/24/2023 0254   ALKPHOS 97 11/24/2023 0254   BILITOT 0.5 11/24/2023 0254   GFRNONAA >60 02/14/2024 1328     VAS US  ABI WITH/WO TBI Result  Date: 03/26/2024  LOWER EXTREMITY DOPPLER STUDY Patient Name:  Penny Chase  Date of Exam:   03/23/2024 Medical Rec #: [id number]       Accession #:    [id number] Date of Birth: 04/29/1971       Patient Gender: F Patient Age:   20 years Exam Location:  [facility] Procedure:      VAS US  ABI WITH/WO TBI Referring Phys: --------------------------------------------------------------------------------  Indications: Peripheral artery disease. left BKA  Performing Technologist: Elsie Churn RT, RDMS, RVT  Examination Guidelines: A complete evaluation includes at minimum, Doppler waveform signals and systolic blood pressure reading at the level of bilateral brachial, anterior tibial, and posterior tibial arteries, when vessel segments are accessible. Bilateral testing is considered an integral part of a complete examination. Photoelectric Plethysmograph (PPG) waveforms and toe systolic pressure readings are included as required and additional duplex testing as needed. Limited examinations for reoccurring indications may be performed as noted.  ABI Findings: +---------+------------------+-----+--------+-------+ Right    Rt Pressure (mmHg)IndexWaveformComment +---------+------------------+-----+--------+-------+ Brachial 118                                    +---------+------------------+-----+--------+-------+ PTA      125               1.06 biphasic        +---------+------------------+-----+--------+-------+ DP       124               1.05 biphasic        +---------+------------------+-----+--------+-------+ Great Toe25                0.21 Abnormal         +---------+------------------+-----+--------+-------+ +--------+------------------+-----+--------+-------+ Left    Lt Pressure (mmHg)IndexWaveformComment +--------+------------------+-----+--------+-------+ [id number]                                    +--------+------------------+-----+--------+-------+ TOES Findings: +----------+---------------+--------+-------+ Right ToesPressure (mmHg)WaveformComment +----------+---------------+--------+-------+ 1st Digit                Abnormal        +----------+---------------+--------+-------+ 2nd Digit                Abnormal        +----------+---------------+--------+-------+ 3rd Digit                Abnormal        +----------+---------------+--------+-------+ 4th Digit                Abnormal        +----------+---------------+--------+-------+ 5th Digit                Abnormal        +----------+---------------+--------+-------+  Summary: Right: Resting right ankle-brachial index is within normal range. The right toe-brachial index is abnormal. PPG tracings appear dampened. *See table(s) above for measurements and observations.  Electronically signed by Cordella Shawl MD on 03/26/2024 at 7:36:36 AM.    Final        Assessment & Plan:   1. Hx of left BKA (HCC) (Primary) Currently patient is doing well being fitted for prosthetic  2. Atherosclerosis of native artery of right lower extremity with intermittent claudication (HCC) Today while the patient does have good ABI with fairly decent tibial vessel waveforms she has a significantly reduced TBI with dampened toe waveforms.  This certainly could be microvascular disease but there is concerned that she may have a smaller occlusion distally that is not necessarily seen via ultrasound.  I believe that in order to avoid any significant issues in the future will be prudent for her to undergo angiogram.  Will have her return in 1 month to repeat this and the studies as well  as to discuss possible intervention  3. Multiple gastric ulcers The patient does have a history of multiple gastric ulcers.  Because of this she is unable to tolerate any antiplatelet or anticoagulation.  She does have an upcoming evaluation with GI to determine if it may be safe for her to utilize any form of antiplatelet therapy.  This will be performed before any possible intervention weeks the patient indeed requires intervention or stent placement, antiplatelet therapy will be necessary to prevent occlusion of the stent postplacement.  As such she currently has an upcoming appointment in the next month or so we will reevaluate   Current Outpatient Medications on File Prior to Visit  Medication Sig Dispense Refill   acetaminophen  (TYLENOL ) 500 MG tablet Take 500 mg by mouth every 6 (six) hours as needed for moderate pain (pain score 4-6).     celecoxib (CELEBREX) 100 MG capsule Take 100 mg by mouth 2 (two) times daily.     cyanocobalamin  1000 MCG tablet Take 1 tablet (1,000 mcg total) by mouth daily. 30 tablet 2   cyclobenzaprine  (FLEXERIL ) 5 MG tablet Take 1 tablet (5 mg total) by mouth 3 (three) times daily as needed for muscle spasms. 30 tablet 0   folic acid  (FOLVITE ) 1 MG tablet Take 1 tablet (1 mg total) by mouth daily. 30 tablet 0   gabapentin  (NEURONTIN ) 600 MG tablet Take 1 tablet (600 mg total) by mouth 3 (three) times daily. 90 tablet 0   iron  polysaccharides (NIFEREX) 150 MG capsule Take 1 capsule (150 mg total) by mouth daily. 30 capsule 0   oxyCODONE  (OXY IR/ROXICODONE ) 5 MG immediate release tablet Take 1 tablet (5 mg total) by mouth every 6 (six) hours as needed for severe pain (pain score 7-10). 46 tablet 0   pantoprazole  (PROTONIX ) 40 MG tablet Take 1 tablet (40 mg total) by mouth 2 (two) times daily before a meal. 60 tablet 1   pravastatin  (PRAVACHOL ) 10 MG tablet Take 1 tablet by mouth daily.     venlafaxine  XR (EFFEXOR -XR) 37.5 MG 24 hr capsule Take 1 capsule (37.5 mg  total) by mouth daily with breakfast. 60 capsule 1   No current facility-administered medications on file prior to visit.    There are no Patient Instructions on file for this visit. No follow-ups on file.   Joyleen Haselton E Kaoir Loree, NP

## 2024-04-02 ENCOUNTER — Ambulatory Visit: Admitting: Certified Registered Nurse Anesthetist

## 2024-04-02 ENCOUNTER — Ambulatory Visit
Admission: RE | Admit: 2024-04-02 | Discharge: 2024-04-02 | Disposition: A | Discharge status: Home / Self Care | Attending: Gastroenterology | Admitting: Gastroenterology

## 2024-04-02 ENCOUNTER — Encounter: Payer: Self-pay | Admitting: Gastroenterology

## 2024-04-02 ENCOUNTER — Ambulatory Visit: Admitting: Student in an Organized Health Care Education/Training Program

## 2024-04-02 ENCOUNTER — Encounter
Admission: RE | Disposition: A | Discharge status: Home / Self Care | Payer: Self-pay | Source: Home / Self Care | Attending: Gastroenterology

## 2024-04-02 DIAGNOSIS — K2970 Gastritis, unspecified, without bleeding: Secondary | ICD-10-CM | POA: Diagnosis not present

## 2024-04-02 DIAGNOSIS — I739 Peripheral vascular disease, unspecified: Secondary | ICD-10-CM | POA: Diagnosis not present

## 2024-04-02 DIAGNOSIS — Z8711 Personal history of peptic ulcer disease: Secondary | ICD-10-CM | POA: Diagnosis present

## 2024-04-02 DIAGNOSIS — D649 Anemia, unspecified: Secondary | ICD-10-CM | POA: Insufficient documentation

## 2024-04-02 DIAGNOSIS — F319 Bipolar disorder, unspecified: Secondary | ICD-10-CM | POA: Diagnosis not present

## 2024-04-02 DIAGNOSIS — K219 Gastro-esophageal reflux disease without esophagitis: Secondary | ICD-10-CM | POA: Insufficient documentation

## 2024-04-02 DIAGNOSIS — I10 Essential (primary) hypertension: Secondary | ICD-10-CM | POA: Insufficient documentation

## 2024-04-02 DIAGNOSIS — Z86711 Personal history of pulmonary embolism: Secondary | ICD-10-CM | POA: Diagnosis not present

## 2024-04-02 DIAGNOSIS — F17210 Nicotine dependence, cigarettes, uncomplicated: Secondary | ICD-10-CM | POA: Insufficient documentation

## 2024-04-02 DIAGNOSIS — K319 Disease of stomach and duodenum, unspecified: Secondary | ICD-10-CM | POA: Insufficient documentation

## 2024-04-02 HISTORY — PX: ESOPHAGOGASTRODUODENOSCOPY: SHX5428

## 2024-04-02 SURGERY — EGD (ESOPHAGOGASTRODUODENOSCOPY)
Anesthesia: General

## 2024-04-02 MED ORDER — PROPOFOL 500 MG/50ML IV EMUL
INTRAVENOUS | Status: DC | PRN
  Administered 2024-04-02: 160 ug/kg/min via INTRAVENOUS

## 2024-04-02 MED ORDER — PROPOFOL 10 MG/ML IV BOLUS
INTRAVENOUS | Status: DC | PRN
  Administered 2024-04-02: 70 mg via INTRAVENOUS

## 2024-04-02 MED ORDER — SODIUM CHLORIDE 0.9 % IV SOLN
INTRAVENOUS | Status: DC
  Administered 2024-04-02: 20 mL/h via INTRAVENOUS

## 2024-04-02 MED ORDER — LIDOCAINE HCL (CARDIAC) PF 100 MG/5ML IV SOSY
PREFILLED_SYRINGE | INTRAVENOUS | Status: DC | PRN
  Administered 2024-04-02: 100 mg via INTRAVENOUS

## 2024-04-02 NOTE — Anesthesia Postprocedure Evaluation (Signed)
 Anesthesia Post Note  Patient: ANIS CINELLI  Procedure(s) Performed: EGD (ESOPHAGOGASTRODUODENOSCOPY)  Patient location during evaluation: Endoscopy Anesthesia Type: General Level of consciousness: awake and alert Pain management: pain level controlled Vital Signs Assessment: post-procedure vital signs reviewed and stable Respiratory status: spontaneous breathing, nonlabored ventilation, respiratory function stable and patient connected to nasal cannula oxygen Cardiovascular status: blood pressure returned to baseline and stable Postop Assessment: no apparent nausea or vomiting Anesthetic complications: no   No notable events documented.   Last Vitals:  Vitals:   04/02/24 1147 04/02/24 1153  BP: 122/79 (!) 146/79  Pulse:  80  Resp:    Temp:    SpO2:  98%    Last Pain:  Vitals:   04/02/24 1153  TempSrc:   PainSc: 0-No pain                 Prentice Murphy

## 2024-04-02 NOTE — Transfer of Care (Signed)
 Immediate Anesthesia Transfer of Care Note  Patient: Penny Chase  Procedure(s) Performed: EGD (ESOPHAGOGASTRODUODENOSCOPY)  Patient Location: PACU  Anesthesia Type:General  Level of Consciousness: drowsy  Airway & Oxygen Therapy: Patient Spontanous Breathing  Post-op Assessment: Report given to RN and Post -op Vital signs reviewed and stable  Post vital signs: Reviewed and stable  Last Vitals:  Vitals Value Taken Time  BP    Temp    Pulse    Resp    SpO2      Last Pain:  Vitals:   04/02/24 1054  TempSrc: Temporal  PainSc: 6          Complications: No notable events documented.

## 2024-04-02 NOTE — H&P (Signed)
 Ruel Kung , MD [address], [address], [place], [place], [address] Phone: [phone] Fax: [phone]  Primary Care Physician:  [facility], [facility]   Pre-Procedure History & Physical: HPI:  Penny Chase is a 53 y.o. female is here for an endoscopy    Past Medical History:  Diagnosis Date   Acute lower limb ischemia    Anemia    Breast cancer (HCC)    Cancer (HCC) 09/2018   Right breast   COVID-19 11/2018   Family history of breast cancer    GERD (gastroesophageal reflux disease)    Hypertension    Personal history of radiation therapy 2020   Rt breast   Pulmonary embolism (HCC)     Past Surgical History:  Procedure Laterality Date   AMPUTATION Left 11/26/2023   Procedure: AMPUTATION BELOW KNEE;  Surgeon: Jama Cordella MATSU, MD;  Location: [facility] ORS;  Service: Vascular;  Laterality: Left;   BREAST BIOPSY Right 09/12/2018   us  core/venus clip-INVASIVE MAMMARYLobular CARCINOMA, WITH LOBULAR FEATURES   BREAST LUMPECTOMY Right 10/04/2018   BREAST LUMPECTOMY WITH NEEDLE LOCALIZATION AND AXILLARY SENTINEL LYMPH NODE BX Right 10/04/2018   Procedure: BREAST LUMPECTOMY WITH NEEDLE LOCALIZATION AND AXILLARY SENTINEL LYMPH NODE BX;  Surgeon: Rodolph Romano, MD;  Location: [facility] ORS;  Service: General;  Laterality: Right;   COLONOSCOPY N/A 11/28/2023   Procedure: COLONOSCOPY;  Surgeon: Jinny Carmine, MD;  Location: [facility] ENDOSCOPY;  Service: Endoscopy;  Laterality: N/A;   ESOPHAGOGASTRODUODENOSCOPY N/A 11/28/2023   Procedure: EGD (ESOPHAGOGASTRODUODENOSCOPY);  Surgeon: Jinny Carmine, MD;  Location: [facility];  Service: Endoscopy;  Laterality: N/A;   GIVENS CAPSULE STUDY N/A 11/28/2023   Procedure: IMAGING PROCEDURE, GI TRACT, INTRALUMINAL, VIA CAPSULE;  Surgeon: Jinny Carmine, MD;  Location: [facility] ENDOSCOPY;  Service: Endoscopy;  Laterality: N/A;   IVC FILTER INSERTION N/A 11/28/2023   Procedure: IVC FILTER INSERTION;  Surgeon: Jama Cordella MATSU, MD;   Location: [facility] INVASIVE CV LAB;  Service: Cardiovascular;  Laterality: N/A;   LOWER EXTREMITY ANGIOGRAPHY Left 11/24/2023   Procedure: Lower Extremity Angiography;  Surgeon: Jama Cordella MATSU, MD;  Location: [facility] INVASIVE CV LAB;  Service: Cardiovascular;  Laterality: Left;   TONSILLECTOMY      Prior to Admission medications   Medication Sig Start Date End Date Taking? Authorizing Provider  acetaminophen  (TYLENOL ) 500 MG tablet Take 500 mg by mouth every 6 (six) hours as needed for moderate pain (pain score 4-6).    [provider]  celecoxib (CELEBREX) 100 MG capsule Take 100 mg by mouth 2 (two) times daily. 11/13/23   [provider]  cyanocobalamin  1000 MCG tablet Take 1 tablet (1,000 mcg total) by mouth daily. 12/01/23   Patel, Sona, MD  cyclobenzaprine  (FLEXERIL ) 5 MG tablet Take 1 tablet (5 mg total) by mouth 3 (three) times daily as needed for muscle spasms. 12/22/23   Brown, Fallon E, NP  folic acid  (FOLVITE ) 1 MG tablet Take 1 tablet (1 mg total) by mouth daily. 12/01/23   Patel, Sona, MD  gabapentin  (NEURONTIN ) 600 MG tablet Take 1 tablet (600 mg total) by mouth 3 (three) times daily. 11/30/23   Patel, Sona, MD  iron  polysaccharides (NIFEREX) 150 MG capsule Take 1 capsule (150 mg total) by mouth daily. 12/01/23   Patel, Sona, MD  oxyCODONE  (OXY IR/ROXICODONE ) 5 MG immediate release tablet Take 1 tablet (5 mg total) by mouth every 6 (six) hours as needed for severe pain (pain score 7-10). 01/02/24   Brown, Fallon E, NP  pantoprazole  (PROTONIX ) 40 MG tablet Take 1 tablet (40 mg total) by mouth 2 (two) times daily before a meal. 11/30/23 01/01/25  Tobie Calix, MD  pravastatin  (PRAVACHOL ) 10 MG tablet Take 1 tablet by mouth daily.    [provider]  venlafaxine  XR (EFFEXOR -XR) 37.5 MG 24 hr capsule Take 1 capsule (37.5 mg total) by mouth daily with breakfast. 02/14/24   Brahmanday, Govinda R, MD    Allergies as of 03/28/2024 - Review Complete 03/27/2024  Allergen Reaction  Noted   Amlodipine  Swelling 01/05/2021    Family History  Problem Relation Age of Onset   Diabetes Mother    Atrial fibrillation Father    Breast cancer Paternal Aunt     Social History   Socioeconomic History   Marital status: Married    Spouse name: Not on file   Number of children: Not on file   Years of education: Not on file   Highest education level: Not on file  Occupational History   Not on file  Tobacco Use   Smoking status: Every Day    Current packs/day: 1.00    Average packs/day: 1 pack/day for 15.0 years (15.0 ttl pk-yrs)    Types: Cigarettes   Smokeless tobacco: Never   Tobacco comments:    patient declined  Vaping Use   Vaping status: Former  Substance and Sexual Activity   Alcohol use: Not Currently    Comment: not taken for about 6 weeks   Drug use: Not Currently    Types: Marijuana    Comment: Uses street opioids   Sexual activity: Not Currently  Other Topics Concern   Not on file  Social History Narrative   Works at [employer];  Smoke 15 cig/day; No alcohol abuse; in [employer]; with husband.    Social Drivers of Corporate investment banker Strain: Low Risk  (03/27/2024)   [facility]   Overall Financial Resource Strain (CARDIA)    Difficulty of Paying Living Expenses: Not hard at all  Food Insecurity: No Food Insecurity (03/27/2024)   Received from [facility]   Hunger Vital Sign    Within the past 12 months, you worried that your food would run out before you got the money to buy more.: Never true    Within the past 12 months, the food you bought just didn't last and you didn't have money to get more.: Never true  Transportation Needs: No Transportation Needs (03/27/2024)   Received from [facility]    In the past 12 months, has lack of transportation kept you from medical appointments or from getting medications?: No    Lack of  Transportation (Non-Medical): No  Physical Activity: Not on file  Stress: Not on file  Social Connections: Not on file  Intimate Partner Violence: Not At Risk (02/14/2024)   Humiliation, Afraid, Rape, and Kick questionnaire    Fear of Current or Ex-Partner: No    Emotionally Abused: No    Physically Abused: No    Sexually Abused: No    Review of Systems: See HPI, otherwise negative ROS  Physical Exam: LMP 09/20/2018 (Exact Date)  General:   Alert,  pleasant and cooperative in NAD Head:  Normocephalic and atraumatic. Neck:  Supple; no masses or thyromegaly. Lungs:  Clear throughout to auscultation, normal respiratory effort.    Heart:  +S1, +S2, Regular rate and rhythm, No edema. Abdomen:  Soft, nontender and  nondistended. Normal bowel sounds, without guarding, and without rebound.   Neurologic:  Alert and  oriented x4;  grossly normal neurologically.  Impression/Plan: Penny Chase is here for an endoscopy  to be performed for  evaluation of peptic ulcer disease    Risks, benefits, limitations, and alternatives regarding endoscopy have been reviewed with the patient.  Questions have been answered.  All parties agreeable.   Ruel Kung, MD  04/02/2024, 10:38 AM

## 2024-04-02 NOTE — Op Note (Signed)
 [facility] Patient Name: Penny Chase Procedure Date: 04/02/2024 11:27 AM MRN: [id number] Account #: [id number] Date of Birth: Jul 27, 1971 Admit Type: Outpatient Age: 53 Room: [facility] ENDO ROOM 1 Gender: Female Note Status: Supervisor Override Instrument Name: [facility] GI Scope [id number] Procedure:             Upper GI endoscopy Indications:           Peptic ulcer Providers:             Ruel Kung MD, MD Medicines:             Monitored Anesthesia Care Complications:         No immediate complications. Procedure:             Pre-Anesthesia Assessment:                        - Prior to the procedure, a History and Physical was                         performed, and patient medications, allergies and                         sensitivities were reviewed. The patient's tolerance                         of previous anesthesia was reviewed.                        - The risks and benefits of the procedure and the                         sedation options and risks were discussed with the                         patient. All questions were answered and informed                         consent was obtained.                        - ASA Grade Assessment: II - A patient with mild                         systemic disease.                        After obtaining informed consent, the endoscope was                         passed under direct vision. Throughout the procedure,                         the patient's blood pressure, pulse, and oxygen                         saturations were monitored continuously. The Endoscope                         was introduced through the mouth, and advanced to the  third part of duodenum. The upper GI endoscopy was                         accomplished with ease. The patient tolerated the                         procedure well. Findings:      The esophagus was normal.      The examined duodenum was normal.       Diffuse moderate inflammation characterized by congestion (edema) and       erythema was found on the greater curvature of the gastric antrum.       Biopsies were taken with a cold forceps for histology.      The cardia and gastric fundus were normal on retroflexion. Impression:            - Normal esophagus.                        - Normal examined duodenum.                        - Gastritis. Biopsied. Recommendation:        - Await pathology results.                        - Discharge patient to home (with escort).                        - Resume previous diet.                        - Continue present medications.                        - Return to GI office as previously scheduled. Procedure Code(s):     --- Professional ---                        [phone], Esophagogastroduodenoscopy, flexible,                         transoral; with biopsy, single or multiple Diagnosis Code(s):     --- Professional ---                        K29.70, Gastritis, unspecified, without bleeding                        K25.4, Chronic or unspecified gastric ulcer with                         hemorrhage CPT copyright 2022 American Medical Association. All rights reserved. The codes documented in this report are preliminary and upon coder review may  be revised to meet current compliance requirements. Ruel Kung, MD Ruel Kung MD, MD 04/02/2024 11:35:18 AM This report has been signed electronically. Number of Addenda: 0 Note Initiated On: 04/02/2024 11:27 AM Estimated Blood Loss:  Estimated blood loss: none.      [facility]

## 2024-04-02 NOTE — Anesthesia Preprocedure Evaluation (Signed)
 Anesthesia Evaluation  Patient identified by MRN, date of birth, ID band Patient awake    Reviewed: Allergy & Precautions, H&P , NPO status , Patient's Chart, lab work & pertinent test results  History of Anesthesia Complications Negative for: history of anesthetic complications  Airway Mallampati: II  TM Distance: >3 FB Neck ROM: full    Dental no notable dental hx.    Pulmonary neg shortness of breath, neg COPD, neg recent URI, Current Smoker and Patient abstained from smoking. recently evaluated in the [facility] March 29 through April 7 with issues including multiple subsegmental PE   Pulmonary exam normal        Cardiovascular hypertension, (-) angina + Peripheral Vascular Disease  (-) Past MI and (-) Cardiac Stents (-) dysrhythmias (-) Valvular Problems/Murmurs Rhythm:Regular Rate:Tachycardia     Neuro/Psych  PSYCHIATRIC DISORDERS   Bipolar Disorder   negative neurological ROS     GI/Hepatic Neg liver ROS, PUD,GERD  ,,  Endo/Other  negative endocrine ROS    Renal/GU      Musculoskeletal   Abdominal   Peds  Hematology  (+) Blood dyscrasia, anemia   Anesthesia Other Findings Hx of multiple PEs at [facility]. Possible ischemic bowel per vascular surgery.   Past Medical History: No date: Breast cancer (HCC) 09/2018: Cancer [facility])     Comment:  Right breast 11/2018: COVID-19 No date: Family history of breast cancer No date: Hypertension 2020: Personal history of radiation therapy     Comment:  Rt breast  Past Surgical History: 09/12/2018: BREAST BIOPSY; Right     Comment:  us  core/venus clip-INVASIVE MAMMARYLobular CARCINOMA,               WITH LOBULAR FEATURES 10/04/2018: BREAST LUMPECTOMY; Right 10/04/2018: BREAST LUMPECTOMY WITH NEEDLE LOCALIZATION AND AXILLARY  SENTINEL LYMPH NODE BX; Right     Comment:  Procedure: BREAST LUMPECTOMY WITH NEEDLE LOCALIZATION               AND AXILLARY SENTINEL LYMPH NODE  BX;  Surgeon:               Rodolph Romano, MD;  Location: [facility] ORS;  Service:              General;  Laterality: Right; 11/24/2023: LOWER EXTREMITY ANGIOGRAPHY; Left     Comment:  Procedure: Lower Extremity Angiography;  Surgeon:               Jama Cordella MATSU, MD;  Location: [facility] INVASIVE CV LAB;               Service: Cardiovascular;  Laterality: Left; No date: TONSILLECTOMY  BMI    Body Mass Index: 30.90 kg/m      Reproductive/Obstetrics negative OB ROS                              Anesthesia Physical Anesthesia Plan  ASA: 3  Anesthesia Plan: General   Post-op Pain Management: Minimal or no pain anticipated   Induction: Intravenous  PONV Risk Score and Plan: 2 and Propofol  infusion and TIVA  Airway Management Planned: Nasal Cannula and Natural Airway  Additional Equipment: None  Intra-op Plan:   Post-operative Plan:   Informed Consent: I have reviewed the patients History and Physical, chart, labs and discussed the procedure including the risks, benefits and alternatives for the proposed anesthesia with the patient or authorized representative who has indicated his/her understanding and acceptance.  Dental advisory given  Plan Discussed with: CRNA and Surgeon  Anesthesia Plan Comments: (Discussed risks of anesthesia with patient, including possibility of difficulty with spontaneous ventilation under anesthesia necessitating airway intervention, PONV, and rare risks such as cardiac or respiratory or neurological events, and allergic reactions. Discussed the role of CRNA in patient's perioperative care. Patient understands.)        Anesthesia Quick Evaluation

## 2024-04-02 NOTE — Anesthesia Procedure Notes (Signed)
 Date/Time: 04/02/2024 11:30 AM  Performed by: Duwayne Craven, CRNAPre-anesthesia Checklist: Patient identified, Emergency Drugs available, Suction available, Patient being monitored and Timeout performed Patient Re-evaluated:Patient Re-evaluated prior to induction Oxygen Delivery Method: Nasal cannula Induction Type: IV induction Placement Confirmation: CO2 detector and positive ETCO2

## 2024-04-04 LAB — SURGICAL PATHOLOGY

## 2024-04-08 ENCOUNTER — Other Ambulatory Visit: Payer: Self-pay | Admitting: Internal Medicine

## 2024-04-09 ENCOUNTER — Encounter: Payer: Self-pay | Admitting: Internal Medicine

## 2024-04-12 ENCOUNTER — Telehealth (INDEPENDENT_AMBULATORY_CARE_PROVIDER_SITE_OTHER): Payer: Self-pay | Admitting: Vascular Surgery

## 2024-04-12 ENCOUNTER — Other Ambulatory Visit (INDEPENDENT_AMBULATORY_CARE_PROVIDER_SITE_OTHER): Payer: Self-pay | Admitting: Nurse Practitioner

## 2024-04-12 DIAGNOSIS — Z89512 Acquired absence of left leg below knee: Secondary | ICD-10-CM

## 2024-04-12 NOTE — Telephone Encounter (Signed)
 Referral sent

## 2024-04-12 NOTE — Telephone Encounter (Signed)
 Patient has been notified referral has been sent

## 2024-04-12 NOTE — Telephone Encounter (Signed)
 Patient spouse came in to clinic. Patient needs RX for prosthetic evaluation and gait training faxed to [facility] to fax# [phone]. Please advise

## 2024-04-23 ENCOUNTER — Ambulatory Visit
Attending: Student in an Organized Health Care Education/Training Program | Admitting: Student in an Organized Health Care Education/Training Program

## 2024-04-23 DIAGNOSIS — M47816 Spondylosis without myelopathy or radiculopathy, lumbar region: Secondary | ICD-10-CM

## 2024-04-23 NOTE — Progress Notes (Signed)
 Forgot to stop her Eliquis and also was not appropriately NPO.  Will need to reschedule her procedure.  Tentatively rescheduled to Monday.  Stop Eliquis 3 days prior, okay to take aspirin  81 mg instead

## 2024-04-23 NOTE — Progress Notes (Signed)
 Safety precautions to be maintained throughout the outpatient stay will include: orient to surroundings, keep bed in low position, maintain call bell within reach at all times, provide assistance with transfer out of bed and ambulation.   Pt to arrive at 0745; pre procedure instructions reviewed and pt verb u/0; pt v/o stopping Eliquis 3 days prior to procedure and take 81 mg baby ASA

## 2024-04-23 NOTE — Patient Instructions (Signed)

## 2024-04-24 ENCOUNTER — Telehealth: Payer: Self-pay

## 2024-04-30 ENCOUNTER — Ambulatory Visit (HOSPITAL_BASED_OUTPATIENT_CLINIC_OR_DEPARTMENT_OTHER): Admitting: Student in an Organized Health Care Education/Training Program

## 2024-04-30 ENCOUNTER — Encounter: Payer: Self-pay | Admitting: Student in an Organized Health Care Education/Training Program

## 2024-04-30 ENCOUNTER — Ambulatory Visit
Admission: RE | Admit: 2024-04-30 | Discharge: 2024-04-30 | Disposition: A | Discharge status: Home / Self Care | Source: Ambulatory Visit | Attending: Student in an Organized Health Care Education/Training Program | Admitting: Student in an Organized Health Care Education/Training Program

## 2024-04-30 VITALS — BP 111/77 | HR 97 | Temp 97.9°F | Resp 16 | Ht 64.0 in | Wt 175.0 lb

## 2024-04-30 DIAGNOSIS — M47816 Spondylosis without myelopathy or radiculopathy, lumbar region: Secondary | ICD-10-CM | POA: Insufficient documentation

## 2024-04-30 MED ORDER — LACTATED RINGERS IV SOLN: Freq: Once | INTRAVENOUS | Status: AC

## 2024-04-30 MED ORDER — LIDOCAINE HCL 2 % IJ SOLN
INTRAMUSCULAR | Status: AC
  Filled 2024-04-30: qty 20

## 2024-04-30 MED ORDER — ROPIVACAINE HCL 2 MG/ML IJ SOLN
INTRAMUSCULAR | Status: AC
  Filled 2024-04-30: qty 20

## 2024-04-30 MED ORDER — MIDAZOLAM HCL 2 MG/2ML IJ SOLN
0.5000 mg | Freq: Once | INTRAMUSCULAR | Status: AC
  Administered 2024-04-30: 2 mg via INTRAVENOUS

## 2024-04-30 MED ORDER — ROPIVACAINE HCL 2 MG/ML IJ SOLN
18.0000 mL | Freq: Once | INTRAMUSCULAR | Status: AC
  Administered 2024-04-30: 18 mL via PERINEURAL

## 2024-04-30 MED ORDER — MIDAZOLAM HCL 2 MG/2ML IJ SOLN
INTRAMUSCULAR | Status: AC
  Filled 2024-04-30: qty 2

## 2024-04-30 MED ORDER — DEXAMETHASONE SODIUM PHOSPHATE 10 MG/ML IJ SOLN
INTRAMUSCULAR | Status: AC
  Filled 2024-04-30: qty 2

## 2024-04-30 MED ORDER — DEXAMETHASONE SODIUM PHOSPHATE 10 MG/ML IJ SOLN
20.0000 mg | Freq: Once | INTRAMUSCULAR | Status: AC
  Administered 2024-04-30: 20 mg

## 2024-04-30 MED ORDER — LIDOCAINE HCL 2 % IJ SOLN
20.0000 mL | Freq: Once | INTRAMUSCULAR | Status: AC
  Administered 2024-04-30: 400 mg

## 2024-04-30 NOTE — Progress Notes (Signed)
 PROVIDER NOTE: Interpretation of information contained herein should be left to medically-trained personnel. Specific patient instructions are provided elsewhere under Patient Instructions section of medical record. This document was created in part using STT-dictation technology, any transcriptional errors that may result from this process are unintentional.  Patient: Penny Chase Type: Established DOB: 03/09/1971 MRN: [id number] PCP: [facility]  Service: Procedure DOS: 04/30/2024 Setting: Ambulatory Location: Ambulatory outpatient facility Delivery: Face-to-face Provider: Wallie Sherry, MD Specialty: Interventional Pain Management Specialty designation: 09 Location: Outpatient facility Ref. Prov.: [facility]       Interventional Therapy   Type: Lumbar Facet, Medial Branch Block(s) (w/ fluoroscopic mapping) #1  Laterality: Bilateral  Level: L3, L4, and L5 Medial Branch/Dorsal Rami Level(s). Injecting these levels blocks the L3-4 and L4-5 lumbar facet joints. Imaging: Fluoroscopic guidance Spinal ([id number]) Anesthesia: Local anesthesia (1-2% Lidocaine ) Sedation: Minimal Sedation                       DOS: 04/30/2024 Performed by: Wallie Sherry, MD  Primary Purpose: Diagnostic/Therapeutic Indications: Low back pain severe enough to impact quality of life or function. 1. Lumbar facet arthropathy   2. Lumbar spondylosis    NAS-11 Pain score:   Pre-procedure: 7 /10   Post-procedure: 0-No pain/10     Position / Prep / Materials:  Position: Prone  Prep solution: ChloraPrep (2% chlorhexidine  gluconate and 70% isopropyl alcohol) Area Prepped: Posterolateral Lumbosacral Spine (Wide prep: From the lower border of the scapula down to the end of the tailbone and from flank to flank.)  Materials:  Tray: Block Needle(s):  Type: Spinal  Gauge (G): 22  Length: 3.5-in Qty: 3     H&P (Pre-op Assessment):  Penny Chase is a 53 y.o. (year old), female  patient, seen today for interventional treatment. She  has a past surgical history that includes Tonsillectomy; Breast lumpectomy (Right, 10/04/2018); Breast lumpectomy with needle localization and axillary sentinel lymph node bx (Right, 10/04/2018); Breast biopsy (Right, 09/12/2018); Lower Extremity Angiography (Left, 11/24/2023); Amputation (Left, 11/26/2023); IVC FILTER INSERTION (N/A, 11/28/2023); Esophagogastroduodenoscopy (N/A, 11/28/2023); Colonoscopy (N/A, 11/28/2023); Givens capsule study (N/A, 11/28/2023); and Esophagogastroduodenoscopy (N/A, 04/02/2024). Penny Chase has a current medication list which includes the following prescription(s): acetaminophen , celecoxib, cyanocobalamin , cyclobenzaprine , folic acid , gabapentin , iron  polysaccharides, oxycodone , pantoprazole , pravastatin , venlafaxine  xr, and apixaban. Her primarily concern today is the Back Pain  Initial Vital Signs:  Pulse/HCG Rate: 97ECG Heart Rate: 97 Temp: 97.9 F (36.6 C) Resp: 16 BP: 116/73 SpO2: 100 %  BMI: Estimated body mass index is 30.04 kg/m as calculated from the following:   Height as of this encounter: 5' 4 (1.626 m).   Weight as of this encounter: 175 lb (79.4 kg).  Risk Assessment: Allergies: Reviewed. She is allergic to amlodipine .  Allergy Precautions: None required Coagulopathies: Reviewed. None identified.  Blood-thinner therapy: None at this time Active Infection(s): Reviewed. None identified. Penny Chase is afebrile  Site Confirmation: Penny Chase was asked to confirm the procedure and laterality before marking the site Procedure checklist: Completed Consent: Before the procedure and under the influence of no sedative(s), amnesic(s), or anxiolytics, the patient was informed of the treatment options, risks and possible complications. To fulfill our ethical and legal obligations, as recommended by the American Medical Association's Code of Ethics, I have informed the patient of my clinical impression; the  nature and purpose of the treatment or procedure; the risks, benefits, and possible complications of the intervention; the alternatives, including doing nothing; the risk(s)  and benefit(s) of the alternative treatment(s) or procedure(s); and the risk(s) and benefit(s) of doing nothing. The patient was provided information about the general risks and possible complications associated with the procedure. These may include, but are not limited to: failure to achieve desired goals, infection, bleeding, organ or nerve damage, allergic reactions, paralysis, and death. In addition, the patient was informed of those risks and complications associated to Spine-related procedures, such as failure to decrease pain; infection (i.e.: Meningitis, epidural or intraspinal abscess); bleeding (i.e.: epidural hematoma, subarachnoid hemorrhage, or any other type of intraspinal or peri-dural bleeding); organ or nerve damage (i.e.: Any type of peripheral nerve, nerve root, or spinal cord injury) with subsequent damage to sensory, motor, and/or autonomic systems, resulting in permanent pain, numbness, and/or weakness of one or several areas of the body; allergic reactions; (i.e.: anaphylactic reaction); and/or death. Furthermore, the patient was informed of those risks and complications associated with the medications. These include, but are not limited to: allergic reactions (i.e.: anaphylactic or anaphylactoid reaction(s)); adrenal axis suppression; blood sugar elevation that in diabetics may result in ketoacidosis or comma; water retention that in patients with history of congestive heart failure may result in shortness of breath, pulmonary edema, and decompensation with resultant heart failure; weight gain; swelling or edema; medication-induced neural toxicity; particulate matter embolism and blood vessel occlusion with resultant organ, and/or nervous system infarction; and/or aseptic necrosis of one or more joints. Finally, the  patient was informed that Medicine is not an exact science; therefore, there is also the possibility of unforeseen or unpredictable risks and/or possible complications that may result in a catastrophic outcome. The patient indicated having understood very clearly. We have given the patient no guarantees and we have made no promises. Enough time was given to the patient to ask questions, all of which were answered to the patient's satisfaction. Ms. Gains has indicated that she wanted to continue with the procedure. Attestation: I, the ordering provider, attest that I have discussed with the patient the benefits, risks, side-effects, alternatives, likelihood of achieving goals, and potential problems during recovery for the procedure that I have provided informed consent. Date  Time: 04/30/2024  7:44 AM  Pre-Procedure Preparation:  Monitoring: As per clinic protocol. Respiration, ETCO2, SpO2, BP, heart rate and rhythm monitor placed and checked for adequate function Safety Precautions: Patient was assessed for positional comfort and pressure points before starting the procedure. Time-out: I initiated and conducted the Time-out before starting the procedure, as per protocol. The patient was asked to participate by confirming the accuracy of the Time Out information. Verification of the correct person, site, and procedure were performed and confirmed by me, the nursing staff, and the patient. Time-out conducted as per Joint Commission's Universal Protocol (UP.01.01.01). Time: 0815 Start Time: 0816 hrs.  Description of Procedure:          Laterality: (see above) Targeted Levels: (see above)  Safety Precautions: Aspiration looking for blood return was conducted prior to all injections. At no point did we inject any substances, as a needle was being advanced. Before injecting, the patient was told to immediately notify me if she was experiencing any new onset of ringing in the ears, or metallic  taste in the mouth. No attempts were made at seeking any paresthesias. Safe injection practices and needle disposal techniques used. Medications properly checked for expiration dates. SDV (single dose vial) medications used. After the completion of the procedure, all disposable equipment used was discarded in the proper designated medical waste containers. Local  Anesthesia: Protocol guidelines were followed. The patient was positioned over the fluoroscopy table. The area was prepped in the usual manner. The time-out was completed. The target area was identified using fluoroscopy. A 12-in long, straight, sterile hemostat was used with fluoroscopic guidance to locate the targets for each level blocked. Once located, the skin was marked with an approved surgical skin marker. Once all sites were marked, the skin (epidermis, dermis, and hypodermis), as well as deeper tissues (fat, connective tissue and muscle) were infiltrated with a small amount of a short-acting local anesthetic, loaded on a 10cc syringe with a 25G, 1.5-in  Needle. An appropriate amount of time was allowed for local anesthetics to take effect before proceeding to the next step. Local Anesthetic: Lidocaine  2.0% The unused portion of the local anesthetic was discarded in the proper designated containers. Technical description of process:  Medial Branch  Dorsal Rami Nerve Block (MBB):  Neuroanatomy note: Each lumbar facet joint receives dual innervation from medial branches arising from the posterior primary rami at the same level and one level above. The target for each lumbar medial branch is the junction of the ipsilateral superior articular and transverse process of the lower vertebral body. (i.e.: The L4-L5 facet joint is innervated by the L4 medial branch [located at L5] and the L3 medial branch [located at L4]. Blocking the L4 Medial Branch is therefore achieved by injecting at the junction of the ipsilateral superior articular and  transverse process of the lower vertebral body [L5].).  Exception: The exception to the above rule is the L5-S1 facet joint which has triple innervation requiring the L4 medial branch, as well as the L5 and the S1 Dorsal Rami(s) to be blocked to fully denervate the joint.  Under fluoroscopic guidance, a needle was inserted until contact was made with os over the target area. After negative aspiration, 2mL of the nerve block solution was injected without difficulty or complication. Paresthesia were avoided during injection. The needle(s) were removed intact and without complication.  Once the entire procedure was completed, the treated area was cleaned, making sure to leave some of the prepping solution back to take advantage of its long term bactericidal properties.         Illustration of the posterior view of the lumbar spine and the posterior neural structures. Laminae of L2 through S1 are labeled. DPRL5, dorsal primary ramus of L5; DPRS1, dorsal primary ramus of S1; DPR3, dorsal primary ramus of L3; FJ, facet (zygapophyseal) joint L3-L4; I, inferior articular process of L4; LB1, lateral branch of dorsal primary ramus of L1; IAB, inferior articular branches from L3 medial branch (supplies L4-L5 facet joint); IBP, intermediate branch plexus; MB3, medial branch of dorsal primary ramus of L3; NR3, third lumbar nerve root; S, superior articular process of L5; SAB, superior articular branches from L4 (supplies L4-5 facet joint also); TP3, transverse process of L3.   Facet Joint Innervation (* possible contribution)  L1-2 T12, L1 (L2*)  Medial Branch  L2-3 L1, L2 (L3*)                     L3-4 L2, L3 (L4*)                     L4-5 L3, L4 (L5*)                     L5-S1 L4, L5, S1  Vitals:   04/30/24 0816 04/30/24 0821 04/30/24 0826 04/30/24 0834  BP: 109/72 115/74 106/63 111/77  Pulse:      Resp: 18 17 16    Temp:      SpO2: 100% 98% 99% 100%  Weight:       Height:         End Time: 0826 hrs.  Imaging Guidance (Spinal):         Type of Imaging Technique: Fluoroscopy Guidance (Spinal) Indication(s): Fluoroscopy guidance for needle placement to enhance accuracy in procedures requiring precise needle localization for targeted delivery of medication in or near specific anatomical locations not easily accessible without such real-time imaging assistance. Exposure Time: Please see nurses notes. Contrast: None used. Fluoroscopic Guidance: I was personally present during the use of fluoroscopy. Tunnel Vision Technique used to obtain the best possible view of the target area. Parallax error corrected before commencing the procedure. Direction-depth-direction technique used to introduce the needle under continuous pulsed fluoroscopy. Once target was reached, antero-posterior, oblique, and lateral fluoroscopic projection used confirm needle placement in all planes. Images permanently stored in EMR. Interpretation: No contrast injected. I personally interpreted the imaging intraoperatively. Adequate needle placement confirmed in multiple planes. Permanent images saved into the patient's record.  Post-operative Assessment:  Post-procedure Vital Signs:  Pulse/HCG Rate: 9793 Temp: 97.9 F (36.6 C) Resp: 16 BP: 111/77 SpO2: 100 %  EBL: None  Complications: No immediate post-treatment complications observed by team, or reported by patient.  Note: The patient tolerated the entire procedure well. A repeat set of vitals were taken after the procedure and the patient was kept under observation following institutional policy, for this type of procedure. Post-procedural neurological assessment was performed, showing return to baseline, prior to discharge. The patient was provided with post-procedure discharge instructions, including a section on how to identify potential problems. Should any problems arise concerning this procedure, the patient was given  instructions to immediately contact us , at any time, without hesitation. In any case, we plan to contact the patient by telephone for a follow-up status report regarding this interventional procedure.  Comments:  No additional relevant information.  Plan of Care (POC)  Orders:  Orders Placed This Encounter  Procedures   DG PAIN CLIN[facility] C-ARM 1-60 MIN NO REPORT    Intraoperative interpretation by procedural physician at [facility].    Standing Status:   Standing    Number of Occurrences:   1    Reason for exam::   Assistance in needle guidance and placement for procedures requiring needle placement in or near specific anatomical locations not easily accessible without such assistance.     Medications ordered for procedure: Meds ordered this encounter  Medications   lidocaine  (XYLOCAINE ) 2 % (with pres) injection 400 mg   lactated ringers  infusion   midazolam  (VERSED ) injection 0.5-2 mg    Make sure Flumazenil is available in the pyxis when using this medication. If oversedation occurs, administer 0.2 mg IV over 15 sec. If after 45 sec no response, administer 0.2 mg again over 1 min; may repeat at 1 min intervals; not to exceed 4 doses (1 mg)   ropivacaine  (PF) 2 mg/mL (0.2%) (NAROPIN ) injection 18 mL   dexamethasone  (DECADRON ) injection 20 mg   Medications administered: We administered lidocaine , lactated ringers , midazolam , ropivacaine  (PF) 2 mg/mL (0.2%), and dexamethasone .  See the medical record for exact dosing, route, and time of administration.    B/L L3-5 MBNB #1 04/30/24    Follow-up plan:   Return  in about 3 weeks (around 05/21/2024) for PPE, F2F, B/L L3,4,5 MBNB.     Recent Visits Date Type Provider Dept  04/23/24 Procedure visit Marcelino Nurse, MD [facility]  03/08/24 Office Visit Marcelino Nurse, MD [facility]  Showing recent visits within past 90 days and meeting all other requirements Today's Visits Date Type Provider Dept   04/30/24 Procedure visit Marcelino Nurse, MD [facility]  Showing today's visits and meeting all other requirements Future Appointments No visits were found meeting these conditions. Showing future appointments within next 90 days and meeting all other requirements   Disposition: Discharge home  Discharge (Date  Time): 04/30/2024; 0839 hrs.   Primary Care Physician: [facility] Location: [facility] Note by: Nurse Marcelino, MD (TTS technology used. I apologize for any typographical errors that were not detected and corrected.) Date: 04/30/2024; Time: 9:22 AM  Disclaimer:  Medicine is not an Visual merchandiser. The only guarantee in medicine is that nothing is guaranteed. It is important to note that the decision to proceed with this intervention was based on the information collected from the patient. The Data and conclusions were drawn from the patient's questionnaire, the interview, and the physical examination. Because the information was provided in large part by the patient, it cannot be guaranteed that it has not been purposely or unconsciously manipulated. Every effort has been made to obtain as much relevant data as possible for this evaluation. It is important to note that the conclusions that lead to this procedure are derived in large part from the available data. Always take into account that the treatment will also be dependent on availability of resources and existing treatment guidelines, considered by other Pain Management Practitioners as being common knowledge and practice, at the time of the intervention. For Medico-Legal purposes, it is also important to point out that variation in procedural techniques and pharmacological choices are the acceptable norm. The indications, contraindications, technique, and results of the above procedure should only be interpreted and judged by a Board-Certified Interventional Pain Specialist with  extensive familiarity and expertise in the same exact procedure and technique.

## 2024-04-30 NOTE — Patient Instructions (Signed)

## 2024-04-30 NOTE — Progress Notes (Signed)
 Safety precautions to be maintained throughout the outpatient stay will include: orient to surroundings, keep bed in low position, maintain call bell within reach at all times, provide assistance with transfer out of bed and ambulation.

## 2024-05-01 ENCOUNTER — Other Ambulatory Visit (INDEPENDENT_AMBULATORY_CARE_PROVIDER_SITE_OTHER): Payer: Self-pay | Admitting: Nurse Practitioner

## 2024-05-01 ENCOUNTER — Telehealth: Payer: Self-pay | Admitting: *Deleted

## 2024-05-01 DIAGNOSIS — Z89512 Acquired absence of left leg below knee: Secondary | ICD-10-CM

## 2024-05-01 DIAGNOSIS — I70211 Atherosclerosis of native arteries of extremities with intermittent claudication, right leg: Secondary | ICD-10-CM

## 2024-05-01 NOTE — Telephone Encounter (Signed)
 Post procedure call:   no  questions or concerns.

## 2024-05-02 ENCOUNTER — Ambulatory Visit: Attending: Nurse Practitioner

## 2024-05-02 DIAGNOSIS — M6281 Muscle weakness (generalized): Secondary | ICD-10-CM | POA: Insufficient documentation

## 2024-05-02 DIAGNOSIS — R262 Difficulty in walking, not elsewhere classified: Secondary | ICD-10-CM | POA: Diagnosis present

## 2024-05-02 DIAGNOSIS — Z89512 Acquired absence of left leg below knee: Secondary | ICD-10-CM | POA: Diagnosis not present

## 2024-05-02 NOTE — Therapy (Signed)
 OUTPATIENT PHYSICAL THERAPY NEURO EVALUATION   Patient Name: Penny Chase MRN: [id number] DOB:1971/07/03, 53 y.o., female Today's Date: 05/02/2024   PCP: [facility]  REFERRING PROVIDER: Delores Orvin BRAVO, NP   END OF SESSION:  PT End of Session - 05/02/24 1455     Visit Number 1    Number of Visits 25    Date for PT Re-Evaluation 07/25/24    PT Start Time 1451    PT Stop Time 1530    PT Time Calculation (min) 39 min    Equipment Utilized During Treatment Gait belt    Activity Tolerance Patient tolerated treatment well    Behavior During Therapy WFL for tasks assessed/performed          Past Medical History:  Diagnosis Date   Acute lower limb ischemia    Anemia    Breast cancer (HCC)    Cancer (HCC) 09/2018   Right breast   COVID-19 11/2018   Family history of breast cancer    GERD (gastroesophageal reflux disease)    Hypertension    Personal history of radiation therapy 2020   Rt breast   Pulmonary embolism (HCC)    Past Surgical History:  Procedure Laterality Date   AMPUTATION Left 11/26/2023   Procedure: AMPUTATION BELOW KNEE;  Surgeon: Jama Cordella MATSU, MD;  Location: [facility] ORS;  Service: Vascular;  Laterality: Left;   BREAST BIOPSY Right 09/12/2018   us  core/venus clip-INVASIVE MAMMARYLobular CARCINOMA, WITH LOBULAR FEATURES   BREAST LUMPECTOMY Right 10/04/2018   BREAST LUMPECTOMY WITH NEEDLE LOCALIZATION AND AXILLARY SENTINEL LYMPH NODE BX Right 10/04/2018   Procedure: BREAST LUMPECTOMY WITH NEEDLE LOCALIZATION AND AXILLARY SENTINEL LYMPH NODE BX;  Surgeon: Rodolph Romano, MD;  Location: [facility];  Service: General;  Laterality: Right;   COLONOSCOPY N/A 11/28/2023   Procedure: COLONOSCOPY;  Surgeon: Jinny Carmine, MD;  Location: [facility] ENDOSCOPY;  Service: Endoscopy;  Laterality: N/A;   ESOPHAGOGASTRODUODENOSCOPY N/A 11/28/2023   Procedure: EGD (ESOPHAGOGASTRODUODENOSCOPY);  Surgeon: Jinny Carmine, MD;  Location: [facility] ENDOSCOPY;   Service: Endoscopy;  Laterality: N/A;   ESOPHAGOGASTRODUODENOSCOPY N/A 04/02/2024   Procedure: EGD (ESOPHAGOGASTRODUODENOSCOPY);  Surgeon: Therisa Bi, MD;  Location: [facility] ENDOSCOPY;  Service: Gastroenterology;  Laterality: N/A;   GIVENS CAPSULE STUDY N/A 11/28/2023   Procedure: IMAGING PROCEDURE, GI TRACT, INTRALUMINAL, VIA CAPSULE;  Surgeon: Jinny Carmine, MD;  Location: [facility] ENDOSCOPY;  Service: Endoscopy;  Laterality: N/A;   IVC FILTER INSERTION N/A 11/28/2023   Procedure: IVC FILTER INSERTION;  Surgeon: Jama Cordella MATSU, MD;  Location: [facility] INVASIVE CV LAB;  Service: Cardiovascular;  Laterality: N/A;   LOWER EXTREMITY ANGIOGRAPHY Left 11/24/2023   Procedure: Lower Extremity Angiography;  Surgeon: Jama Cordella MATSU, MD;  Location: [facility] INVASIVE CV LAB;  Service: Cardiovascular;  Laterality: Left;   TONSILLECTOMY     Patient Active Problem List   Diagnosis Date Noted   Lumbar spondylosis 03/08/2024   Complex regional pain syndrome type 2 of left lower extremity 03/08/2024   Atherosclerosis of native arteries of extremity with intermittent claudication (HCC) 03/02/2024   Phantom limb syndrome with pain (HCC) 03/01/2024   Diverticulitis of colon 03/01/2024   Pain 02/13/2024   Hx of left BKA (HCC) 01/25/2024   Symptomatic anemia 12/01/2023   Multiple gastric ulcers 11/28/2023   Ischemic colitis (HCC) 11/28/2023   Bloody stools 11/26/2023   Rectal bleeding 11/26/2023   Acute blood loss anemia 11/26/2023   Blister of left foot 11/24/2023   Limb ischemia 11/23/2023   Gangrene of left foot (  HCC) 11/23/2023   Tobacco abuse 11/23/2023   PE (pulmonary thromboembolism) (HCC) 11/23/2023   GI bleeding 11/23/2023   Substance abuse (HCC) 11/23/2023   Leukocytosis 11/23/2023   Bipolar affective disorder, current episode manic (HCC) 01/06/2021   Brief psychotic disorder (HCC) 01/03/2021   Family history of breast cancer    Carcinoma of upper-outer quadrant of right breast in female, estrogen  receptor positive (HCC) 09/20/2018   Gastroesophageal reflux disease without esophagitis 01/04/2017   HTN (hypertension), benign 01/20/2014    ONSET DATE: 04/11/24  REFERRING DIAG: S10.487 (ICD-10-CM) - Hx of left BKA (HCC)   THERAPY DIAG:  Difficulty in walking, not elsewhere classified  Muscle weakness (generalized)  Rationale for Evaluation and Treatment: Rehabilitation  SUBJECTIVE:                                                                                                                                                                                             SUBJECTIVE STATEMENT:  Pt had L BKA on 12/02/23.  Pt received prosthetic on 04/11/24.  Pt has been attempting to ambulate with the use of a walker at home and was even able to safely navigated 4 steps up/down.  Pt reports she is determined to get out of the wheelchair.    Pt reports having low back pain and having pain blocker injections on 04/30/24 at [facility] Pt accompanied by: significant other, Doug  PERTINENT HISTORY: Patient is a 53 year old female with acute limb ischemia s/p left BKA. PE in association with lower GI bleed s/p IVC filter.   PAIN:  Are you having pain? Yes: NPRS scale: 4-5/10 Pain location: Phantom pain in the L lateral leg down to the toes.  Pt notes that the bone feels as though it is sticking out inside of the prosthetic/ Pain description: Phantom pain/electrical Aggravating factors: Standing/walking/putting pressure Relieving factors: Ice/Laying down  PRECAUTIONS: None  RED FLAGS: None   WEIGHT BEARING RESTRICTIONS: No  FALLS: Has patient fallen in last 6 months? Yes. Number of falls 2  LIVING ENVIRONMENT: Lives with: lives with their spouse Lives in: House/apartment Stairs: Ramped entrance Has following equipment at home: Environmental consultant - 2 wheeled, Wheelchair (manual), and Architectural technologist  PLOF: Independent  PATIENT GOALS: Get out of the wheelchair and be able to get outside and  do yard work that needs to be done.  OBJECTIVE:  Note: Objective measures were completed at Evaluation unless otherwise noted.  DIAGNOSTIC FINDINGS:   EXAM: MRI LUMBAR SPINE WITHOUT CONTRAST  IMPRESSION: Generally mild for age lumbar spine degeneration. No spinal stenosis or convincing neural impingement.   However, small left-side  annular fissures of the discs at L4-L5 and L5-S1, which might be a source for Left L5 and/or S1 radiculitis (respectively).     COGNITION: Overall cognitive status: Within functional limits for tasks assessed   SENSATION: WFL  COORDINATION: N/T  POSTURE: No Significant postural limitations  LOWER EXTREMITY ROM:     Active  Right Eval Left Eval  Hip flexion    Hip extension    Hip abduction    Hip adduction    Hip internal rotation    Hip external rotation    Knee flexion    Knee extension    Ankle dorsiflexion    Ankle plantarflexion    Ankle inversion    Ankle eversion     (Blank rows = not tested)  LOWER EXTREMITY MMT:    MMT Right Eval Left Eval  Hip flexion 4 5  Hip extension    Hip abduction    Hip adduction    Hip internal rotation    Hip external rotation    Knee flexion 5 3+  Knee extension 5 3+  Ankle dorsiflexion    Ankle plantarflexion    Ankle inversion    Ankle eversion    (Blank rows = not tested)  BED MOBILITY:  Not tested  TRANSFERS: Sit to stand: Complete Independence  Assistive device utilized: None     Stand to sit: Complete Independence  Assistive device utilized: None     Chair to chair: Complete Independence  Assistive device utilized: None       STAIRS: Findings: Level of Assistance: CGA, Stair Negotiation Technique: Step to Pattern with Bilateral Rails, Number of Stairs: 4, Height of Stairs: 6   , and Comments: Pt performed well the task, however ambulates up/down without the proper form due to the pain in the L LE.   GAIT: Findings: Distance walked: 4 and Comments: Pt with antalgic  gait patter when stepping on the L LE.  FUNCTIONAL TESTS:  5 times sit to stand: 11.36 sec Timed up and go (TUG): 19.53 sec 6 minute walk test: TBD 10 meter walk test: TBD  PATIENT SURVEYS:  LCI-5: TBD                                                                                                                              TREATMENT DATE: 05/02/24  Self-Care/Home Management:  Pt given education on current POC, the findings of the evaluation, and the ways in which skilled therapy can address the current deficits in balance and musculature strength.  Pt instructed on how this can improve their overall function and maintain/improve their overall quality of life.  Pt also instructed on how to perform stair training at home in order to take load off of the prosthetic leg due to the pain experienced.      PATIENT EDUCATION: Education details: Pt educated on role of PT and services provided during current POC, along with prognosis and information about the clinic.  Person educated: Patient and Spouse Education method: Explanation Education comprehension: verbalized understanding  HOME EXERCISE PROGRAM: TBD at next visit  GOALS: Goals reviewed with patient? Yes  SHORT TERM GOALS: Target date: 05/30/2024  Pt will be independent with HEP in order to demonstrate increased ability to perform tasks related to occupation/hobbies. Baseline: Goal status: INITIAL  LONG TERM GOALS: Target date: 07/25/2024  1.  Patient (> 11 years old) will complete five times sit to stand test in < 15 seconds indicating an increased LE strength and improved balance. Baseline: 11.36 sec Goal status: INITIAL   2.  Patient will reduce timed up and go to <11 seconds to reduce fall risk and demonstrate improved transfer/gait ability. Baseline: 19.53 sec Goal status: INITIAL  3.  Patient will increase 10 meter walk test to >1.20m/s as to improve gait speed for better community ambulation and to reduce fall  risk. Baseline: TBD Goal status: INITIAL  4.  Patient will increase six minute walk test distance to >1000 for progression to community ambulator and improve gait ability. Baseline: TBD Goal status: INITIAL  5.  Pt will improve their Locomotor Capabilities Index (LCI-5) score by at least 7 points (MCID), demonstrating a clinically meaningful increase in ability to perform basic locomotor tasks with assistance and/or use of aids. Baseline: TBD Goal Status: INITIAL   ASSESSMENT:  CLINICAL IMPRESSION: Patient is a 53 y.o. female who was seen today for physical therapy evaluation and treatment for hx of L BKA.   Pt currently demonstrates weakness in the L LE and antalgic gait pattern that makes it more difficult to ambulate at this time.  Pt currently requires UE assistance in order to ambulate short/long distances and if wanting to reduce her dependence on assistive devices while increasing overall mobility.  Pt will benefit from skilled therapy to assist pt with transfer training, mobility, gait, and strength training to improve overall tolerance to ambulation and improve current QoL and return as close to prior level of function.    OBJECTIVE IMPAIRMENTS: Abnormal gait, decreased activity tolerance, decreased balance, decreased endurance, decreased knowledge of use of DME, decreased mobility, difficulty walking, decreased ROM, decreased strength, prosthetic dependency , and pain.   ACTIVITY LIMITATIONS: carrying, lifting, bending, standing, squatting, stairs, transfers, bathing, toileting, dressing, hygiene/grooming, and caring for others  PARTICIPATION LIMITATIONS: meal prep, cleaning, laundry, driving, shopping, community activity, occupation, and yard work  PERSONAL FACTORS: Age, Behavior pattern, Fitness, Past/current experiences, Profession, Time since onset of injury/illness/exacerbation, and 3+ comorbidities: anemia, breast cancer, HTN, PE's are also affecting patient's functional  outcome.   REHAB POTENTIAL: Good  CLINICAL DECISION MAKING: Evolving/moderate complexity  EVALUATION COMPLEXITY: Moderate  PLAN:  PT FREQUENCY: 2x/week  PT DURATION: 12 weeks  PLANNED INTERVENTIONS: 97750- Physical Performance Testing, 97110-Therapeutic exercises, 97530- Therapeutic activity, [id number]- Neuromuscular re-education, 97535- Self Care, [id number]- Manual therapy, [phone]- Gait training, [phone]- Orthotic/Prosthetic subsequent, Patient/Family education, Balance training, Stair training, Joint mobilization, and DME instructions  PLAN FOR NEXT SESSION:  Continue with assessment and establish HEP.  , , LCI-5 questionaire    Fonda Simpers, PT, DPT Physical Therapist - [facility]  [facility]  05/02/24, 2:57 PM

## 2024-05-02 NOTE — Addendum Note (Signed)
 Addended by: SILVER FONDA ORN on: 05/02/2024 05:44 PM   Modules accepted: Orders

## 2024-05-03 ENCOUNTER — Ambulatory Visit (INDEPENDENT_AMBULATORY_CARE_PROVIDER_SITE_OTHER)

## 2024-05-03 ENCOUNTER — Ambulatory Visit (INDEPENDENT_AMBULATORY_CARE_PROVIDER_SITE_OTHER): Admitting: Vascular Surgery

## 2024-05-03 DIAGNOSIS — Z89512 Acquired absence of left leg below knee: Secondary | ICD-10-CM

## 2024-05-03 DIAGNOSIS — I70211 Atherosclerosis of native arteries of extremities with intermittent claudication, right leg: Secondary | ICD-10-CM | POA: Diagnosis not present

## 2024-05-07 LAB — VAS US ABI WITH/WO TBI: Right ABI: 1.13

## 2024-05-08 ENCOUNTER — Ambulatory Visit: Admitting: Physical Therapy

## 2024-05-08 DIAGNOSIS — R262 Difficulty in walking, not elsewhere classified: Secondary | ICD-10-CM | POA: Diagnosis not present

## 2024-05-08 DIAGNOSIS — M6281 Muscle weakness (generalized): Secondary | ICD-10-CM

## 2024-05-08 NOTE — Therapy (Deleted)
 OUTPATIENT PHYSICAL THERAPY NEURO EVALUATION   Patient Name: Penny Chase MRN: [id number] DOB:02/17/1971, 53 y.o., female Today's Date: 05/08/2024   PCP: [facility]  REFERRING PROVIDER: Delores Orvin BRAVO, NP   END OF SESSION:    Past Medical History:  Diagnosis Date   Acute lower limb ischemia    Anemia    Breast cancer (HCC)    Cancer (HCC) 09/2018   Right breast   COVID-19 11/2018   Family history of breast cancer    GERD (gastroesophageal reflux disease)    Hypertension    Personal history of radiation therapy 2020   Rt breast   Pulmonary embolism (HCC)    Past Surgical History:  Procedure Laterality Date   AMPUTATION Left 11/26/2023   Procedure: AMPUTATION BELOW KNEE;  Surgeon: Jama Cordella MATSU, MD;  Location: [facility] ORS;  Service: Vascular;  Laterality: Left;   BREAST BIOPSY Right 09/12/2018   us  core/venus clip-INVASIVE MAMMARYLobular CARCINOMA, WITH LOBULAR FEATURES   BREAST LUMPECTOMY Right 10/04/2018   BREAST LUMPECTOMY WITH NEEDLE LOCALIZATION AND AXILLARY SENTINEL LYMPH NODE BX Right 10/04/2018   Procedure: BREAST LUMPECTOMY WITH NEEDLE LOCALIZATION AND AXILLARY SENTINEL LYMPH NODE BX;  Surgeon: Rodolph Romano, MD;  Location: [facility];  Service: General;  Laterality: Right;   COLONOSCOPY N/A 11/28/2023   Procedure: COLONOSCOPY;  Surgeon: Jinny Carmine, MD;  Location: [facility] ENDOSCOPY;  Service: Endoscopy;  Laterality: N/A;   ESOPHAGOGASTRODUODENOSCOPY N/A 11/28/2023   Procedure: EGD (ESOPHAGOGASTRODUODENOSCOPY);  Surgeon: Jinny Carmine, MD;  Location: [facility] ENDOSCOPY;  Service: Endoscopy;  Laterality: N/A;   ESOPHAGOGASTRODUODENOSCOPY N/A 04/02/2024   Procedure: EGD (ESOPHAGOGASTRODUODENOSCOPY);  Surgeon: Therisa Bi, MD;  Location: [facility] ENDOSCOPY;  Service: Gastroenterology;  Laterality: N/A;   GIVENS CAPSULE STUDY N/A 11/28/2023   Procedure: IMAGING PROCEDURE, GI TRACT, INTRALUMINAL, VIA CAPSULE;  Surgeon: Jinny Carmine, MD;  Location:  [facility] ENDOSCOPY;  Service: Endoscopy;  Laterality: N/A;   IVC FILTER INSERTION N/A 11/28/2023   Procedure: IVC FILTER INSERTION;  Surgeon: Jama Cordella MATSU, MD;  Location: [facility] INVASIVE CV LAB;  Service: Cardiovascular;  Laterality: N/A;   LOWER EXTREMITY ANGIOGRAPHY Left 11/24/2023   Procedure: Lower Extremity Angiography;  Surgeon: Jama Cordella MATSU, MD;  Location: [facility] INVASIVE CV LAB;  Service: Cardiovascular;  Laterality: Left;   TONSILLECTOMY     Patient Active Problem List   Diagnosis Date Noted   Lumbar spondylosis 03/08/2024   Complex regional pain syndrome type 2 of left lower extremity 03/08/2024   Atherosclerosis of native arteries of extremity with intermittent claudication 03/02/2024   Phantom limb syndrome with pain (HCC) 03/01/2024   Diverticulitis of colon 03/01/2024   Pain 02/13/2024   Hx of left BKA (HCC) 01/25/2024   Symptomatic anemia 12/01/2023   Multiple gastric ulcers 11/28/2023   Ischemic colitis 11/28/2023   Bloody stools 11/26/2023   Rectal bleeding 11/26/2023   Acute blood loss anemia 11/26/2023   Blister of left foot 11/24/2023   Limb ischemia 11/23/2023   Gangrene of left foot (HCC) 11/23/2023   Tobacco abuse 11/23/2023   PE (pulmonary thromboembolism) (HCC) 11/23/2023   GI bleeding 11/23/2023   Substance abuse (HCC) 11/23/2023   Leukocytosis 11/23/2023   Bipolar affective disorder, current episode manic (HCC) 01/06/2021   Brief psychotic disorder (HCC) 01/03/2021   Family history of breast cancer    Carcinoma of upper-outer quadrant of right breast in female, estrogen receptor positive (HCC) 09/20/2018   Gastroesophageal reflux disease without esophagitis 01/04/2017   HTN (hypertension), benign 01/20/2014    ONSET DATE:  04/11/24  REFERRING DIAG: S10.487 (ICD-10-CM) - Hx of left BKA (HCC)   THERAPY DIAG:  Difficulty in walking, not elsewhere classified  Muscle weakness (generalized)  Rationale for Evaluation and Treatment:  Rehabilitation  SUBJECTIVE:                                                                                                                                                                                             SUBJECTIVE STATEMENT:  Pt had L BKA on 12/02/23.  Pt received prosthetic on 04/11/24.  Pt has been attempting to ambulate with the use of a walker at home and was even able to safely navigated 4 steps up/down.  Pt reports she is determined to get out of the wheelchair.    Pt reports having low back pain and having pain blocker injections on 04/30/24 at [facility] Pt accompanied by: significant other, Doug  PERTINENT HISTORY: Patient is a 53 year old female with acute limb ischemia s/p left BKA. PE in association with lower GI bleed s/p IVC filter.   PAIN:  Are you having pain? Yes: NPRS scale: 4-5/10 Pain location: Phantom pain in the L lateral leg down to the toes.  Pt notes that the bone feels as though it is sticking out inside of the prosthetic/ Pain description: Phantom pain/electrical Aggravating factors: Standing/walking/putting pressure Relieving factors: Ice/Laying down  PRECAUTIONS: None  RED FLAGS: None   WEIGHT BEARING RESTRICTIONS: No  FALLS: Has patient fallen in last 6 months? Yes. Number of falls 2  LIVING ENVIRONMENT: Lives with: lives with their spouse Lives in: House/apartment Stairs: Ramped entrance Has following equipment at home: Environmental consultant - 2 wheeled, Wheelchair (manual), and Architectural technologist  PLOF: Independent  PATIENT GOALS: Get out of the wheelchair and be able to get outside and do yard work that needs to be done.  OBJECTIVE:  Note: Objective measures were completed at Evaluation unless otherwise noted.  DIAGNOSTIC FINDINGS:   EXAM: MRI LUMBAR SPINE WITHOUT CONTRAST  IMPRESSION: Generally mild for age lumbar spine degeneration. No spinal stenosis or convincing neural impingement.   However, small left-side annular fissures of the  discs at L4-L5 and L5-S1, which might be a source for Left L5 and/or S1 radiculitis (respectively).     COGNITION: Overall cognitive status: Within functional limits for tasks assessed   SENSATION: WFL  COORDINATION: N/T  POSTURE: No Significant postural limitations  LOWER EXTREMITY ROM:     Active  Right Eval Left Eval  Hip flexion    Hip extension    Hip abduction    Hip adduction    Hip internal rotation  Hip external rotation    Knee flexion    Knee extension    Ankle dorsiflexion    Ankle plantarflexion    Ankle inversion    Ankle eversion     (Blank rows = not tested)  LOWER EXTREMITY MMT:    MMT Right Eval Left Eval  Hip flexion 4 5  Hip extension    Hip abduction    Hip adduction    Hip internal rotation    Hip external rotation    Knee flexion 5 3+  Knee extension 5 3+  Ankle dorsiflexion    Ankle plantarflexion    Ankle inversion    Ankle eversion    (Blank rows = not tested)  BED MOBILITY:  Not tested  TRANSFERS: Sit to stand: Complete Independence  Assistive device utilized: None     Stand to sit: Complete Independence  Assistive device utilized: None     Chair to chair: Complete Independence  Assistive device utilized: None       STAIRS: Findings: Level of Assistance: CGA, Stair Negotiation Technique: Step to Pattern with Bilateral Rails, Number of Stairs: 4, Height of Stairs: 6   , and Comments: Pt performed well the task, however ambulates up/down without the proper form due to the pain in the L LE.   GAIT: Findings: Distance walked: 4 and Comments: Pt with antalgic gait patter when stepping on the L LE.  FUNCTIONAL TESTS:  5 times sit to stand: 11.36 sec Timed up and go (TUG): 19.53 sec 6 minute walk test: TBD 10 meter walk test: TBD  PATIENT SURVEYS:  LCI-5: TBD                                                                                                                              TREATMENT DATE:  05/08/24  Self-Care/Home Management:  Pt given education on current POC, the findings of the evaluation, and the ways in which skilled therapy can address the current deficits in balance and musculature strength.  Pt instructed on how this can improve their overall function and maintain/improve their overall quality of life.  Pt also instructed on how to perform stair training at home in order to take load off of the prosthetic leg due to the pain experienced.    10 Meter Walk Test: Patient instructed to walk 10 meters (32.8 ft) as quickly and as safely as possible at their normal speed Results: *** m/s (*** seconds ***)  Cut off scores:   Household Ambulator  < 0.4 m/s  Limited Community Ambulator  0.4 - 0.8 m/s  [employer]  > 0.8 m/s  Increased fall risk  < 1.93m/s  Crossing a Street  >1.32m/s  MCID 0.05 m/s (small), 0.13 m/s (moderate), 0.06 m/s (significant)  (ANPTA Core Set of Outcome Measures for Adults with Neurologic Conditions, 2018)      6 Min Walk Test:  Instructed patient to ambulate as quickly and as safely as  possible for 6 minutes using LRAD. Patient was allowed to take standing rest breaks without stopping the test, but if the patient required a sitting rest break the clock would be stopped and the test would be over.  Results: *** feet using a *** with ***. Results indicate that the patient has reduced endurance with ambulation compared to age matched norms.  Age Matched Norms (in meters): 102-69 yo M: 79 F: 35, 60-79 yo M: 65 F: 471, 19-89 yo M: 417 F: 392 MDC: 58.21 meters (190.98 feet) or 50 meters (ANPTA Core Set of Outcome Measures for Adults with Neurologic Conditions, 2018)  LCI score-   PATIENT EDUCATION: Education details: Pt educated on role of PT and services provided during current POC, along with prognosis and information about the clinic. Person educated: Patient and Spouse Education method: Explanation Education comprehension:  verbalized understanding  HOME EXERCISE PROGRAM: TBD at next visit  GOALS: Goals reviewed with patient? Yes  SHORT TERM GOALS: Target date: 05/30/2024  Pt will be independent with HEP in order to demonstrate increased ability to perform tasks related to occupation/hobbies. Baseline: Goal status: INITIAL  LONG TERM GOALS: Target date: 07/25/2024  1.  Patient (> 62 years old) will complete five times sit to stand test in < 15 seconds indicating an increased LE strength and improved balance. Baseline: 11.36 sec Goal status: INITIAL   2.  Patient will reduce timed up and go to <11 seconds to reduce fall risk and demonstrate improved transfer/gait ability. Baseline: 19.53 sec Goal status: INITIAL  3.  Patient will increase 10 meter walk test to >1.73m/s as to improve gait speed for better community ambulation and to reduce fall risk. Baseline: TBD Goal status: INITIAL  4.  Patient will increase six minute walk test distance to >1000 for progression to community ambulator and improve gait ability. Baseline: TBD Goal status: INITIAL  5.  Pt will improve their Locomotor Capabilities Index (LCI-5) score by at least 7 points (MCID), demonstrating a clinically meaningful increase in ability to perform basic locomotor tasks with assistance and/or use of aids. Baseline: TBD Goal Status: INITIAL   ASSESSMENT:  CLINICAL IMPRESSION: Patient is a 53 y.o. female who was seen today for physical therapy evaluation and treatment for hx of L BKA.   Pt currently demonstrates weakness in the L LE and antalgic gait pattern that makes it more difficult to ambulate at this time.  Pt currently requires UE assistance in order to ambulate short/long distances and if wanting to reduce her dependence on assistive devices while increasing overall mobility.  Pt will benefit from skilled therapy to assist pt with transfer training, mobility, gait, and strength training to improve overall tolerance to ambulation  and improve current QoL and return as close to prior level of function.    OBJECTIVE IMPAIRMENTS: Abnormal gait, decreased activity tolerance, decreased balance, decreased endurance, decreased knowledge of use of DME, decreased mobility, difficulty walking, decreased ROM, decreased strength, prosthetic dependency , and pain.   ACTIVITY LIMITATIONS: carrying, lifting, bending, standing, squatting, stairs, transfers, bathing, toileting, dressing, hygiene/grooming, and caring for others  PARTICIPATION LIMITATIONS: meal prep, cleaning, laundry, driving, shopping, community activity, occupation, and yard work  PERSONAL FACTORS: Age, Behavior pattern, Fitness, Past/current experiences, Profession, Time since onset of injury/illness/exacerbation, and 3+ comorbidities: anemia, breast cancer, HTN, PE's are also affecting patient's functional outcome.   REHAB POTENTIAL: Good  CLINICAL DECISION MAKING: Evolving/moderate complexity  EVALUATION COMPLEXITY: Moderate  PLAN:  PT FREQUENCY: 2x/week  PT DURATION: 12 weeks  PLANNED INTERVENTIONS: 97750- Physical Performance Testing, 97110-Therapeutic exercises, 97530- Therapeutic activity, [id number]- Neuromuscular re-education, [phone]- Self Care, [id number]- Manual therapy, [phone]- Gait training, [phone]- Orthotic/Prosthetic subsequent, Patient/Family education, Balance training, Stair training, Joint mobilization, and DME instructions  PLAN FOR NEXT SESSION:  Continue with assessment and establish HEP.  , , LCI-5 questionaire   Note: Portions of this document were prepared using Dragon voice recognition software and although reviewed may contain unintentional dictation errors in syntax, grammar, or spelling.  Lonni KATHEE Gainer PT ,DPT Physical Therapist- [facility]   05/08/24, 9:17 AM

## 2024-05-08 NOTE — Therapy (Signed)
 OUTPATIENT PHYSICAL THERAPY NEURO TREATMENT   Patient Name: Penny Chase MRN: [id number] DOB:Nov 15, 1970, 53 y.o., female Today's Date: 05/08/2024   PCP: [facility]  REFERRING PROVIDER: Delores Orvin BRAVO, NP   END OF SESSION:  PT End of Session - 05/08/24 1458     Visit Number 2    Number of Visits 25    Date for Recertification  07/25/24    PT Start Time 1319    PT Stop Time 1358    PT Time Calculation (min) 39 min    Equipment Utilized During Treatment Gait belt    Activity Tolerance Patient tolerated treatment well    Behavior During Therapy [facility] for tasks assessed/performed           Past Medical History:  Diagnosis Date   Acute lower limb ischemia    Anemia    Breast cancer (HCC)    Cancer (HCC) 09/2018   Right breast   COVID-19 11/2018   Family history of breast cancer    GERD (gastroesophageal reflux disease)    Hypertension    Personal history of radiation therapy 2020   Rt breast   Pulmonary embolism (HCC)    Past Surgical History:  Procedure Laterality Date   AMPUTATION Left 11/26/2023   Procedure: AMPUTATION BELOW KNEE;  Surgeon: Jama Cordella MATSU, MD;  Location: [facility] ORS;  Service: Vascular;  Laterality: Left;   BREAST BIOPSY Right 09/12/2018   us  core/venus clip-INVASIVE MAMMARYLobular CARCINOMA, WITH LOBULAR FEATURES   BREAST LUMPECTOMY Right 10/04/2018   BREAST LUMPECTOMY WITH NEEDLE LOCALIZATION AND AXILLARY SENTINEL LYMPH NODE BX Right 10/04/2018   Procedure: BREAST LUMPECTOMY WITH NEEDLE LOCALIZATION AND AXILLARY SENTINEL LYMPH NODE BX;  Surgeon: Rodolph Romano, MD;  Location: [facility] ORS;  Service: General;  Laterality: Right;   COLONOSCOPY N/A 11/28/2023   Procedure: COLONOSCOPY;  Surgeon: Jinny Carmine, MD;  Location: [facility] ENDOSCOPY;  Service: Endoscopy;  Laterality: N/A;   ESOPHAGOGASTRODUODENOSCOPY N/A 11/28/2023   Procedure: EGD (ESOPHAGOGASTRODUODENOSCOPY);  Surgeon: Jinny Carmine, MD;  Location: [facility] ENDOSCOPY;   Service: Endoscopy;  Laterality: N/A;   ESOPHAGOGASTRODUODENOSCOPY N/A 04/02/2024   Procedure: EGD (ESOPHAGOGASTRODUODENOSCOPY);  Surgeon: Therisa Bi, MD;  Location: [facility] ENDOSCOPY;  Service: Gastroenterology;  Laterality: N/A;   GIVENS CAPSULE STUDY N/A 11/28/2023   Procedure: IMAGING PROCEDURE, GI TRACT, INTRALUMINAL, VIA CAPSULE;  Surgeon: Jinny Carmine, MD;  Location: [facility] ENDOSCOPY;  Service: Endoscopy;  Laterality: N/A;   IVC FILTER INSERTION N/A 11/28/2023   Procedure: IVC FILTER INSERTION;  Surgeon: Jama Cordella MATSU, MD;  Location: [facility] INVASIVE CV LAB;  Service: Cardiovascular;  Laterality: N/A;   LOWER EXTREMITY ANGIOGRAPHY Left 11/24/2023   Procedure: Lower Extremity Angiography;  Surgeon: Jama Cordella MATSU, MD;  Location: [facility] INVASIVE CV LAB;  Service: Cardiovascular;  Laterality: Left;   TONSILLECTOMY     Patient Active Problem List   Diagnosis Date Noted   Lumbar spondylosis 03/08/2024   Complex regional pain syndrome type 2 of left lower extremity 03/08/2024   Atherosclerosis of native arteries of extremity with intermittent claudication 03/02/2024   Phantom limb syndrome with pain (HCC) 03/01/2024   Diverticulitis of colon 03/01/2024   Pain 02/13/2024   Hx of left BKA (HCC) 01/25/2024   Symptomatic anemia 12/01/2023   Multiple gastric ulcers 11/28/2023   Ischemic colitis 11/28/2023   Bloody stools 11/26/2023   Rectal bleeding 11/26/2023   Acute blood loss anemia 11/26/2023   Blister of left foot 11/24/2023   Limb ischemia 11/23/2023   Gangrene of left foot (HCC)  11/23/2023   Tobacco abuse 11/23/2023   PE (pulmonary thromboembolism) (HCC) 11/23/2023   GI bleeding 11/23/2023   Substance abuse (HCC) 11/23/2023   Leukocytosis 11/23/2023   Bipolar affective disorder, current episode manic (HCC) 01/06/2021   Brief psychotic disorder (HCC) 01/03/2021   Family history of breast cancer    Carcinoma of upper-outer quadrant of right breast in female, estrogen receptor  positive (HCC) 09/20/2018   Gastroesophageal reflux disease without esophagitis 01/04/2017   HTN (hypertension), benign 01/20/2014    ONSET DATE: 04/11/24  REFERRING DIAG: S10.487 (ICD-10-CM) - Hx of left BKA (HCC)   THERAPY DIAG:  Difficulty in walking, not elsewhere classified  Muscle weakness (generalized)  Rationale for Evaluation and Treatment: Rehabilitation  SUBJECTIVE:                                                                                                                                                                                             SUBJECTIVE STATEMENT:  Pt reports no changes since last session. Has continued to work on her walking.   From Eval: Pt had L BKA on 12/02/23.  Pt received prosthetic on 04/11/24.  Pt has been attempting to ambulate with the use of a walker at home and was even able to safely navigated 4 steps up/down.  Pt reports she is determined to get out of the wheelchair.    Pt reports having low back pain and having pain blocker injections on 04/30/24 at [facility] Pt accompanied by: significant other, Doug  PERTINENT HISTORY: Patient is a 53 year old female with acute limb ischemia s/p left BKA. PE in association with lower GI bleed s/p IVC filter.   PAIN:  Are you having pain? Yes: NPRS scale: 4-5/10 Pain location: Phantom pain in the L lateral leg down to the toes.  Pt notes that the bone feels as though it is sticking out inside of the prosthetic/ Pain description: Phantom pain/electrical Aggravating factors: Standing/walking/putting pressure Relieving factors: Ice/Laying down  PRECAUTIONS: None  RED FLAGS: None   WEIGHT BEARING RESTRICTIONS: No  FALLS: Has patient fallen in last 6 months? Yes. Number of falls 2  LIVING ENVIRONMENT: Lives with: lives with their spouse Lives in: House/apartment Stairs: Ramped entrance Has following equipment at home: Environmental consultant - 2 wheeled, Wheelchair (manual), and Architectural technologist  PLOF:  Independent  PATIENT GOALS: Get out of the wheelchair and be able to get outside and do yard work that needs to be done.  OBJECTIVE:  Note: Objective measures were completed at Evaluation unless otherwise noted.  DIAGNOSTIC FINDINGS:   EXAM: MRI LUMBAR SPINE WITHOUT CONTRAST  IMPRESSION: Generally mild  for age lumbar spine degeneration. No spinal stenosis or convincing neural impingement.   However, small left-side annular fissures of the discs at L4-L5 and L5-S1, which might be a source for Left L5 and/or S1 radiculitis (respectively).     COGNITION: Overall cognitive status: Within functional limits for tasks assessed   SENSATION: WFL  COORDINATION: N/T  POSTURE: No Significant postural limitations  LOWER EXTREMITY ROM:     Active  Right Eval Left Eval  Hip flexion    Hip extension    Hip abduction    Hip adduction    Hip internal rotation    Hip external rotation    Knee flexion    Knee extension    Ankle dorsiflexion    Ankle plantarflexion    Ankle inversion    Ankle eversion     (Blank rows = not tested)  LOWER EXTREMITY MMT:    MMT Right Eval Left Eval  Hip flexion 4 5  Hip extension    Hip abduction    Hip adduction    Hip internal rotation    Hip external rotation    Knee flexion 5 3+  Knee extension 5 3+  Ankle dorsiflexion    Ankle plantarflexion    Ankle inversion    Ankle eversion    (Blank rows = not tested)  BED MOBILITY:  Not tested  TRANSFERS: Sit to stand: Complete Independence  Assistive device utilized: None     Stand to sit: Complete Independence  Assistive device utilized: None     Chair to chair: Complete Independence  Assistive device utilized: None       STAIRS: Findings: Level of Assistance: CGA, Stair Negotiation Technique: Step to Pattern with Bilateral Rails, Number of Stairs: 4, Height of Stairs: 6   , and Comments: Pt performed well the task, however ambulates up/down without the proper form due to the  pain in the L LE.   GAIT: Findings: Distance walked: 4 and Comments: Pt with antalgic gait patter when stepping on the L LE.  FUNCTIONAL TESTS:  5 times sit to stand: 11.36 sec Timed up and go (TUG): 19.53 sec 6 minute walk test: TBD 10 meter walk test: TBD  PATIENT SURVEYS:  LCI-5: TBD                                                                                                                              TREATMENT DATE: 05/08/24   6 Min Walk Test:  Instructed patient to ambulate as quickly and as safely as possible for 6 minutes using LRAD. Patient was allowed to take standing rest breaks without stopping the test, but if the patient required a sitting rest break the clock would be stopped and the test would be over.  Results: 112 feet in 2 min and 8 sec using a RW with SBA. Results indicate that the patient has reduced endurance with ambulation compared to age matched norms.  Age Matched Norms (  in meters): 13-69 yo M: 37 F: 66, 26-79 yo M: 28 F: 471, 58-89 yo M: 417 F: 392 MDC: 58.21 meters (190.98 feet) or 50 meters (ANPTA Core Set of Outcome Measures for Adults with Neurologic Conditions, 2018)  TE- To improve strength, endurance, mobility, and function of specific targeted muscle groups or improve joint range of motion or improve muscle flexibility Instructed in the following HEP and provided with handout  Access Code: [email or web address] URL: [email or web address] Date: 05/08/2024 Prepared by: Lonni Gainer  Exercises - Forward and backward step Right leg   - 3 sets - 15 reps - Standing Hip Abduction with Counter Support  2 sets - 10 reps - Mini Squat with Counter Support  2 sets - 10 reps  TA- To improve functional movements patterns for everyday tasks   Gait with instruction to keep L knee in more extension in stance to prevent ant translation of tibia and pain in ant aspect of tibia. X 50 ft   Stair ascend and descend with step to pattern x 2 rounds     PATIENT EDUCATION: Education details: Pt educated on role of PT and services provided during current POC, along with prognosis and information about the clinic. Person educated: Patient and Spouse Education method: Explanation Education comprehension: verbalized understanding  HOME EXERCISE PROGRAM: Access Code: [email or web address] URL: [email or web address] Date: 05/08/2024 Prepared by: Lonni Gainer  Exercises - Forward and backward step Right leg   - 1 x daily - 7 x weekly - 3 sets - 15 reps - Standing Hip Abduction with Counter Support  - 1 x daily - 7 x weekly - 2 sets - 10 reps - Mini Squat with Counter Support  - 1 x daily - 7 x weekly - 2 sets - 10 reps  GOALS: Goals reviewed with patient? Yes  SHORT TERM GOALS: Target date: 05/30/2024  Pt will be independent with HEP in order to demonstrate increased ability to perform tasks related to occupation/hobbies. Baseline: Goal status: INITIAL  LONG TERM GOALS: Target date: 07/25/2024  1.  Patient (> 62 years old) will complete five times sit to stand test in < 15 seconds indicating an increased LE strength and improved balance. Baseline: 11.36 sec Goal status: INITIAL   2.  Patient will reduce timed up and go to <11 seconds to reduce fall risk and demonstrate improved transfer/gait ability. Baseline: 19.53 sec Goal status: INITIAL  3.  Patient will increase 10 meter walk test to >1.60m/s as to improve gait speed for better community ambulation and to reduce fall risk. Baseline: TBD Goal status: INITIAL  4.  Patient will increase six minute walk test distance to >1000 for progression to community ambulator and improve gait ability. Baseline: 112 feet in 2 min and 8 sec  Goal status: INITIAL  5.  Pt will improve their Locomotor Capabilities Index (LCI-5) score by at least 7 points (MCID), demonstrating a clinically meaningful increase in ability to perform basic locomotor tasks with assistance and/or use of  aids. Baseline: TBD Goal Status: INITIAL   ASSESSMENT:  CLINICAL IMPRESSION: Patient is a 53 y.o. female who was seen today for physical therapy treatment for hx of L BKA.  Pt currently demonstrates weakness in the L LE and antalgic gait pattern that makes it more difficult to ambulate at this time. Pt challenged with stair navigation and provided with ways to improve her ability to ascend and descend steps. Will continue to address pt gait mechanics to improve comfort and quality of gait. Main key  today was bearing weight through more broad area of remaining limb.    Pt will benefit from skilled therapy to assist pt with transfer training, mobility, gait, and strength training to improve overall tolerance to ambulation and improve current QoL and return as close to prior level of function.    OBJECTIVE IMPAIRMENTS: Abnormal gait, decreased activity tolerance, decreased balance, decreased endurance, decreased knowledge of use of DME, decreased mobility, difficulty walking, decreased ROM, decreased strength, prosthetic dependency , and pain.   ACTIVITY LIMITATIONS: carrying, lifting, bending, standing, squatting, stairs, transfers, bathing, toileting, dressing, hygiene/grooming, and caring for others  PARTICIPATION LIMITATIONS: meal prep, cleaning, laundry, driving, shopping, community activity, occupation, and yard work  PERSONAL FACTORS: Age, Behavior pattern, Fitness, Past/current experiences, Profession, Time since onset of injury/illness/exacerbation, and 3+ comorbidities: anemia, breast cancer, HTN, PE's are also affecting patient's functional outcome.   REHAB POTENTIAL: Good  CLINICAL DECISION MAKING: Evolving/moderate complexity  EVALUATION COMPLEXITY: Moderate  PLAN:  PT FREQUENCY: 2x/week  PT DURATION: 12 weeks  PLANNED INTERVENTIONS: 97750- Physical Performance Testing, 97110-Therapeutic exercises, 97530- Therapeutic activity, [id number]- Neuromuscular re-education, 97535- Self  Care, [id number]- Manual therapy, [phone]- Gait training, [phone]- Orthotic/Prosthetic subsequent, Patient/Family education, Balance training, Stair training, Joint mobilization, and DME instructions  PLAN FOR NEXT SESSION:  Continue with assessment and establish HEP.  , , LCI-5 questionaire    Note: Portions of this document were prepared using Dragon voice recognition software and although reviewed may contain unintentional dictation errors in syntax, grammar, or spelling.  Lonni KATHEE Gainer PT ,DPT Physical Therapist- [facility]   05/08/24, 3:00 PM

## 2024-05-13 NOTE — Progress Notes (Deleted)
 MRN : [id number]  Penny Chase is a 53 y.o. (Aug 10, 1971) female who presents with chief complaint of check circulation.  History of Present Illness:   The patient returns to the office for followup regarding atherosclerotic changes of the lower extremities and review of the noninvasive studies.   There have been no interval changes in lower extremity symptoms. No interval shortening of the patient's claudication distance or development of rest pain symptoms. No new ulcers or wounds have occurred since the last visit.  There have been no significant changes to the patient's overall health care.  The patient denies amaurosis fugax or recent TIA symptoms. There are no documented recent neurological changes noted. There is no history of DVT, PE or superficial thrombophlebitis. The patient denies recent episodes of angina or shortness of breath.   ABI Rt=*** and Lt=***  (previous ABI's Rt=*** and Lt=***) Duplex ultrasound of the ***  No outpatient medications have been marked as taking for the 05/14/24 encounter (Appointment) with Jama, Cordella MATSU, MD.    Past Medical History:  Diagnosis Date   Acute lower limb ischemia    Anemia    Breast cancer (HCC)    Cancer (HCC) 09/2018   Right breast   COVID-19 11/2018   Family history of breast cancer    GERD (gastroesophageal reflux disease)    Hypertension    Personal history of radiation therapy 2020   Rt breast   Pulmonary embolism (HCC)     Past Surgical History:  Procedure Laterality Date   AMPUTATION Left 11/26/2023   Procedure: AMPUTATION BELOW KNEE;  Surgeon: Jama Cordella MATSU, MD;  Location: [facility] ORS;  Service: Vascular;  Laterality: Left;   BREAST BIOPSY Right 09/12/2018   us  core/venus clip-INVASIVE MAMMARYLobular CARCINOMA, WITH LOBULAR FEATURES   BREAST LUMPECTOMY Right 10/04/2018   BREAST LUMPECTOMY WITH NEEDLE LOCALIZATION AND AXILLARY  SENTINEL LYMPH NODE BX Right 10/04/2018   Procedure: BREAST LUMPECTOMY WITH NEEDLE LOCALIZATION AND AXILLARY SENTINEL LYMPH NODE BX;  Surgeon: Rodolph Romano, MD;  Location: [facility] ORS;  Service: General;  Laterality: Right;   COLONOSCOPY N/A 11/28/2023   Procedure: COLONOSCOPY;  Surgeon: Jinny Carmine, MD;  Location: [facility] ENDOSCOPY;  Service: Endoscopy;  Laterality: N/A;   ESOPHAGOGASTRODUODENOSCOPY N/A 11/28/2023   Procedure: EGD (ESOPHAGOGASTRODUODENOSCOPY);  Surgeon: Jinny Carmine, MD;  Location: [facility] ENDOSCOPY;  Service: Endoscopy;  Laterality: N/A;   ESOPHAGOGASTRODUODENOSCOPY N/A 04/02/2024   Procedure: EGD (ESOPHAGOGASTRODUODENOSCOPY);  Surgeon: Therisa Bi, MD;  Location: [facility] ENDOSCOPY;  Service: Gastroenterology;  Laterality: N/A;   GIVENS CAPSULE STUDY N/A 11/28/2023   Procedure: IMAGING PROCEDURE, GI TRACT, INTRALUMINAL, VIA CAPSULE;  Surgeon: Jinny Carmine, MD;  Location: [facility] ENDOSCOPY;  Service: Endoscopy;  Laterality: N/A;   IVC FILTER INSERTION N/A 11/28/2023   Procedure: IVC FILTER INSERTION;  Surgeon: Jama Cordella MATSU, MD;  Location: [facility] INVASIVE CV LAB;  Service: Cardiovascular;  Laterality: N/A;   LOWER EXTREMITY ANGIOGRAPHY Left 11/24/2023   Procedure: Lower Extremity Angiography;  Surgeon: Jama Cordella MATSU, MD;  Location: [facility] INVASIVE CV LAB;  Service: Cardiovascular;  Laterality: Left;   TONSILLECTOMY      Social  History Social History   Tobacco Use   Smoking status: Every Day    Current packs/day: 1.00    Average packs/day: 1 pack/day for 15.0 years (15.0 ttl pk-yrs)    Types: Cigarettes   Smokeless tobacco: Never   Tobacco comments:    patient declined  Vaping Use   Vaping status: Former  Substance Use Topics   Alcohol use: Not Currently    Comment: not taken for about 6 weeks   Drug use: Not Currently    Types: Marijuana    Comment: Uses street opioids    Family History Family History  Problem Relation Age of Onset   Diabetes Mother    Atrial  fibrillation Father    Breast cancer Paternal Aunt     Allergies  Allergen Reactions   Amlodipine  Swelling     REVIEW OF SYSTEMS (Negative unless checked)  Constitutional: [] Weight loss  [] Fever  [] Chills Cardiac: [] Chest pain   [] Chest pressure   [] Palpitations   [] Shortness of breath when laying flat   [] Shortness of breath with exertion. Vascular:  [x] Pain in legs with walking   [] Pain in legs at rest  [] History of DVT   [] Phlebitis   [] Swelling in legs   [] Varicose veins   [] Non-healing ulcers Pulmonary:   [] Uses home oxygen   [] Productive cough   [] Hemoptysis   [] Wheeze  [] COPD   [] Asthma Neurologic:  [] Dizziness   [] Seizures   [] History of stroke   [] History of TIA  [] Aphasia   [] Vissual changes   [] Weakness or numbness in arm   [] Weakness or numbness in leg Musculoskeletal:   [] Joint swelling   [] Joint pain   [] Low back pain Hematologic:  [] Easy bruising  [] Easy bleeding   [] Hypercoagulable state   [] Anemic Gastrointestinal:  [] Diarrhea   [] Vomiting  [] Gastroesophageal reflux/heartburn   [] Difficulty swallowing. Genitourinary:  [] Chronic kidney disease   [] Difficult urination  [] Frequent urination   [] Blood in urine Skin:  [] Rashes   [] Ulcers  Psychological:  [] History of anxiety   []  History of major depression.  Physical Examination  There were no vitals filed for this visit. There is no height or weight on file to calculate BMI. Gen: WD/WN, NAD Head: [place]/AT, No temporalis wasting.  Ear/Nose/Throat: Hearing grossly intact, nares w/o erythema or drainage Eyes: PER, EOMI, sclera nonicteric.  Neck: Supple, no masses.  No bruit or JVD.  Pulmonary:  Good air movement, no audible wheezing, no use of accessory muscles.  Cardiac: RRR, normal S1, S2, no Murmurs. Vascular:  mild trophic changes, no open wounds Vessel Right Left  Radial Palpable Palpable  PT Not Palpable Not Palpable  DP Not Palpable Not Palpable  Gastrointestinal: soft, non-distended. No guarding/no peritoneal  signs.  Musculoskeletal: M/S 5/5 throughout.  No visible deformity.  Neurologic: CN 2-12 intact. Pain and light touch intact in extremities.  Symmetrical.  Speech is fluent. Motor exam as listed above. Psychiatric: Judgment intact, Mood & affect appropriate for pt's clinical situation. Dermatologic: No rashes or ulcers noted.  No changes consistent with cellulitis.   CBC Lab Results  Component Value Date   WBC 10.8 (H) 02/14/2024   HGB 14.0 02/14/2024   HCT 44.3 02/14/2024   MCV 87.4 02/14/2024   PLT 417 (H) 02/14/2024    BMET    Component Value Date/Time   NA 135 02/14/2024 1328   K 4.3 02/14/2024 1328   CL 98 02/14/2024 1328   CO2 27 02/14/2024 1328   GLUCOSE 105 (H) 02/14/2024 1328   BUN  13 02/14/2024 1328   CREATININE 0.85 02/14/2024 1328   CALCIUM  9.7 02/14/2024 1328   GFRNONAA >60 02/14/2024 1328   GFRAA >60 05/31/2016 1152   CrCl cannot be calculated (Patient's most recent lab result is older than the maximum 21 days allowed.).  COAG Lab Results  Component Value Date   INR 1.1 11/23/2023    Radiology VAS US  ABI WITH/WO TBI Result Date: 05/07/2024  LOWER EXTREMITY DOPPLER STUDY Patient Name:  Penny Chase  Date of Exam:   05/03/2024 Medical Rec #: [id number]       Accession #:    [id number] Date of Birth: 10-29-1970       Patient Gender: F Patient Age:   89 years Exam Location:  [facility] Procedure:      VAS US  ABI WITH/WO TBI Referring Phys: Cordella Shawl --------------------------------------------------------------------------------  Indications: Peripheral artery disease. left BKA  Comparison Study: 03/23/2024 Performing Technologist: Leafy Gibes RVS  Examination Guidelines: A complete evaluation includes at minimum, Doppler waveform signals and systolic blood pressure reading at the level of bilateral brachial, anterior tibial, and posterior tibial arteries, when vessel segments are accessible. Bilateral testing is considered an integral part of  a complete examination. Photoelectric Plethysmograph (PPG) waveforms and toe systolic pressure readings are included as required and additional duplex testing as needed. Limited examinations for reoccurring indications may be performed as noted.  ABI Findings: +---------+------------------+-----+---------+--------+ Right    Rt Pressure (mmHg)IndexWaveform Comment  +---------+------------------+-----+---------+--------+ Brachial 124                                      +---------+------------------+-----+---------+--------+ ATA      122               0.98 triphasic         +---------+------------------+-----+---------+--------+ PTA      140               1.13 triphasic         +---------+------------------+-----+---------+--------+ [place]               0.94 Normal            +---------+------------------+-----+---------+--------+ +--------+------------------+-----+--------+-------+ Left    Lt Pressure (mmHg)IndexWaveformComment +--------+------------------+-----+--------+-------+ [id number]                                    +--------+------------------+-----+--------+-------+ ATA                                    Lt BKA  +--------+------------------+-----+--------+-------+ +-------+-----------+-----------+------------+------------+ ABI/TBIToday's ABIToday's TBIPrevious ABIPrevious TBI +-------+-----------+-----------+------------+------------+ Right  1.13       .94        1.06        .21          +-------+-----------+-----------+------------+------------+ Left   Lt BKA                Lt BKA                   +-------+-----------+-----------+------------+------------+ Right ABIs appear essentially unchanged compared to prior study on 03/23/2024. Right TBIs appear increased compared to prior study on 03/23/2024.  Summary: Right: Resting right ankle-brachial index is within normal range. The right toe-brachial index is normal.  Left:  Lt BKA.   *  See table(s) above for measurements and observations.  Electronically signed by Cordella Shawl MD on 05/07/2024 at 10:41:34 AM.    Final    DG PAIN CLINIC C-ARM 1-60 MIN NO REPORT Result Date: 04/30/2024 Fluoro was used, but no Radiologist interpretation will be provided. Please refer to NOTES tab for provider progress note.    Assessment/Plan There are no diagnoses linked to this encounter.   Cordella Shawl, MD  05/13/2024 4:01 PM

## 2024-05-14 ENCOUNTER — Ambulatory Visit

## 2024-05-14 ENCOUNTER — Ambulatory Visit (INDEPENDENT_AMBULATORY_CARE_PROVIDER_SITE_OTHER): Admitting: Vascular Surgery

## 2024-05-14 DIAGNOSIS — I2699 Other pulmonary embolism without acute cor pulmonale: Secondary | ICD-10-CM

## 2024-05-14 DIAGNOSIS — M6281 Muscle weakness (generalized): Secondary | ICD-10-CM

## 2024-05-14 DIAGNOSIS — G5772 Causalgia of left lower limb: Secondary | ICD-10-CM

## 2024-05-14 DIAGNOSIS — I70211 Atherosclerosis of native arteries of extremities with intermittent claudication, right leg: Secondary | ICD-10-CM

## 2024-05-14 DIAGNOSIS — I10 Essential (primary) hypertension: Secondary | ICD-10-CM

## 2024-05-14 DIAGNOSIS — R262 Difficulty in walking, not elsewhere classified: Secondary | ICD-10-CM | POA: Diagnosis not present

## 2024-05-14 NOTE — Therapy (Signed)
 OUTPATIENT PHYSICAL THERAPY TREATMENT  Patient Name: Penny Chase MRN: [id number] DOB:[birth year], 53 y.o., female Today's Date: 05/14/2024  PCP: [facility] REFERRING PROVIDER: Delores Orvin BRAVO, NP  END OF SESSION:  PT End of Session - 05/14/24 1620     Visit Number 3    Number of Visits 25    Date for Recertification  07/25/24    Authorization Type [employer]    Progress Note Due on Visit 10    PT Start Time 1615    PT Stop Time 1655    PT Time Calculation (min) 40 min    Equipment Utilized During Treatment Gait belt    Activity Tolerance Patient tolerated treatment well;No increased pain    Behavior During Therapy [facility] for tasks assessed/performed           Past Medical History:  Diagnosis Date   Acute lower limb ischemia    Anemia    Breast cancer (HCC)    Cancer (HCC) 09/2018   Right breast   COVID-19 11/2018   Family history of breast cancer    GERD (gastroesophageal reflux disease)    Hypertension    Personal history of radiation therapy 2020   Rt breast   Pulmonary embolism [facility])    Past Surgical History:  Procedure Laterality Date   AMPUTATION Left 11/26/2023   Procedure: AMPUTATION BELOW KNEE;  Surgeon: Jama Cordella MATSU, MD;  Location: [facility] ORS;  Service: Vascular;  Laterality: Left;   BREAST BIOPSY Right 09/12/2018   us  core/venus clip-INVASIVE MAMMARYLobular CARCINOMA, WITH LOBULAR FEATURES   BREAST LUMPECTOMY Right 10/04/2018   BREAST LUMPECTOMY WITH NEEDLE LOCALIZATION AND AXILLARY SENTINEL LYMPH NODE BX Right 10/04/2018   Procedure: BREAST LUMPECTOMY WITH NEEDLE LOCALIZATION AND AXILLARY SENTINEL LYMPH NODE BX;  Surgeon: Rodolph Romano, MD;  Location: [facility] ORS;  Service: General;  Laterality: Right;   COLONOSCOPY N/A 11/28/2023   Procedure: COLONOSCOPY;  Surgeon: Jinny Carmine, MD;  Location: [facility] ENDOSCOPY;  Service: Endoscopy;  Laterality: N/A;   ESOPHAGOGASTRODUODENOSCOPY N/A 11/28/2023   Procedure: EGD  (ESOPHAGOGASTRODUODENOSCOPY);  Surgeon: Jinny Carmine, MD;  Location: [facility] ENDOSCOPY;  Service: Endoscopy;  Laterality: N/A;   ESOPHAGOGASTRODUODENOSCOPY N/A 04/02/2024   Procedure: EGD (ESOPHAGOGASTRODUODENOSCOPY);  Surgeon: Therisa Bi, MD;  Location: [facility] ENDOSCOPY;  Service: Gastroenterology;  Laterality: N/A;   GIVENS CAPSULE STUDY N/A 11/28/2023   Procedure: IMAGING PROCEDURE, GI TRACT, INTRALUMINAL, VIA CAPSULE;  Surgeon: Jinny Carmine, MD;  Location: [facility] ENDOSCOPY;  Service: Endoscopy;  Laterality: N/A;   IVC FILTER INSERTION N/A 11/28/2023   Procedure: IVC FILTER INSERTION;  Surgeon: Jama Cordella MATSU, MD;  Location: [facility] INVASIVE CV LAB;  Service: Cardiovascular;  Laterality: N/A;   LOWER EXTREMITY ANGIOGRAPHY Left 11/24/2023   Procedure: Lower Extremity Angiography;  Surgeon: Jama Cordella MATSU, MD;  Location: [facility] INVASIVE CV LAB;  Service: Cardiovascular;  Laterality: Left;   TONSILLECTOMY     Patient Active Problem List   Diagnosis Date Noted   Lumbar spondylosis 03/08/2024   Complex regional pain syndrome type 2 of left lower extremity 03/08/2024   Atherosclerosis of native arteries of extremity with intermittent claudication 03/02/2024   Phantom limb syndrome with pain (HCC) 03/01/2024   Diverticulitis of colon 03/01/2024   Pain 02/13/2024   Hx of left BKA (HCC) 01/25/2024   Symptomatic anemia 12/01/2023   Multiple gastric ulcers 11/28/2023   Ischemic colitis 11/28/2023   Bloody stools 11/26/2023   Rectal bleeding 11/26/2023   Acute blood loss anemia 11/26/2023   Blister of left foot 11/24/2023  Limb ischemia 11/23/2023   Gangrene of left foot (HCC) 11/23/2023   Tobacco abuse 11/23/2023   PE (pulmonary thromboembolism) (HCC) 11/23/2023   GI bleeding 11/23/2023   Substance abuse (HCC) 11/23/2023   Leukocytosis 11/23/2023   Bipolar affective disorder, current episode manic (HCC) 01/06/2021   Brief psychotic disorder (HCC) 01/03/2021   Family history of breast cancer     Carcinoma of upper-outer quadrant of right breast in female, estrogen receptor positive (HCC) 09/20/2018   Gastroesophageal reflux disease without esophagitis 01/04/2017   HTN (hypertension), benign 01/20/2014    ONSET DATE: 04/11/24  REFERRING DIAG: S10.487 (ICD-10-CM) - Hx of left BKA (HCC)   THERAPY DIAG:  Difficulty in walking, not elsewhere classified  Muscle weakness (generalized)  Rationale for Evaluation and Treatment: Rehabilitation  SUBJECTIVE:                                                                                                                                                                                             SUBJECTIVE STATEMENT: Socket has been updated since last visit, [facility] also recommended more socks to aide in comfort.   Pt accompanied by: significant other, Doug  PERTINENT HISTORY: Patient is a 53 year old female with acute limb ischemia s/p left BKA. PE in association with lower GI bleed s/p IVC filter.   PAIN:  Are you having pain? 7/10 (right hip, low back, 7-8/10 anterior lef ttibia with prolonged weightbearing)   PRECAUTIONS: None  WEIGHT BEARING RESTRICTIONS: WBAT  FALLS: Has patient fallen in last 6 months? Yes. Number of falls 2  LIVING ENVIRONMENT: Lives with: lives with their spouse Lives in: House/apartment Stairs: Ramped entrance Has following equipment at home: Environmental consultant - 2 wheeled, Wheelchair (manual), and Architectural technologist  PLOF: Independent  PATIENT GOALS: Get out of the wheelchair and be able to get outside and do yard work that needs to be done.  OBJECTIVE:  Note: Objective measures were completed at Evaluation unless otherwise noted.  DIAGNOSTIC FINDINGS:   EXAM: MRI LUMBAR SPINE WITHOUT CONTRAST  IMPRESSION: Generally mild for age lumbar spine degeneration. No spinal stenosis or convincing neural impingement.   However, small left-side annular fissures of the discs at L4-L5 and L5-S1, which might be a source  for Left L5 and/or S1 radiculitis (respectively).   LOWER EXTREMITY MMT:    MMT Right Eval Left Eval  Hip flexion 4 5  Hip extension    Hip abduction    Hip adduction    Hip internal rotation    Hip external rotation    Knee flexion 5 3+  Knee extension 5 3+  Ankle dorsiflexion  Ankle plantarflexion    Ankle inversion    Ankle eversion    (Blank rows = not tested)                                                                                                                              TREATMENT DATE: 05/14/24  -STS transfer to Nustep; Seat 6, arms 6, level 1 x90sec, then level 2x 3.1min -STS from elevated Nustep to RW x12  -AMB to // bars c RW  -standing left heel prop TKE stretch 2x45sec (aggravates Rt bursitis)  -seated left hamstirngs TKE stretch 2x45sec  *knee extension improved and pressure on anterior tibia also improved -education on Left seated heel prop strethc and supine version *3 added ot home for self experiment and selection -semi narrow stance lateral weight shifting x10, the rotational weight shifting x10 bilat -standined LLE hip flexion x15, ABDCT x15, hip extension x15 (anterior hip pain, better with flexed knee)    PATIENT EDUCATION: Education details: Pt educated on role of PT and services provided during current POC, along with prognosis and information about the clinic. Person educated: Patient and Spouse Education method: Explanation Education comprehension: verbalized understanding  HOME EXERCISE PROGRAM: Access Code: [email or web address] URL: [email or web address] Date: 05/14/2024 Prepared by: Peggye Linear  Exercises - Forward and backward step Right leg   - 1 x daily - 7 x weekly - 3 sets - 15 reps - Standing Hip Abduction with Counter Support  - 1 x daily - 7 x weekly - 2 sets - 10 reps - Mini Squat with Counter Support  - 1 x daily - 7 x weekly - 2 sets - 10 reps - Seated Hamstring Stretch  - 2 x daily - 1 sets - 2 reps - 45sec  hold - Seated Knee Extension Stretch with Chair  - 2 x daily - 1 sets - 2 reps - 45sec hold - Supine Knee Extension Stretch on Towel Roll  - 2 x daily - 1 sets - 2 reps - 90 hold  GOALS: Goals reviewed with patient? Yes  SHORT TERM GOALS: Target date: 05/30/2024  Pt will be independent with HEP in order to demonstrate increased ability to perform tasks related to occupation/hobbies. Baseline: Goal status: INITIAL  LONG TERM GOALS: Target date: 07/25/2024  1.  Patient (> 51 years old) will complete five times sit to stand test in < 15 seconds indicating an increased LE strength and improved balance. Baseline: 11.36 sec Goal status: INITIAL   2.  Patient will reduce timed up and go to <11 seconds to reduce fall risk and demonstrate improved transfer/gait ability. Baseline: 19.53 sec Goal status: INITIAL  3.  Patient will increase 10 meter walk test to >1.70m/s as to improve gait speed for better community ambulation and to reduce fall risk. Baseline: TBD Goal status: INITIAL  4.  Patient will increase six minute walk test distance to >1000 for progression to community ambulator and improve gait ability.  Baseline: 112 feet in 2 min and 8 sec  Goal status: INITIAL  5.  Pt will improve their Locomotor Capabilities Index (LCI-5) score by at least 7 points (MCID), demonstrating a clinically meaningful increase in ability to perform basic locomotor tasks with assistance and/or use of aids. Baseline: TBD Goal Status: INITIAL  ASSESSMENT:  CLINICAL IMPRESSION: Continued to advance progressive loading in stance and advancing weight shifting. Pt still having a lot of pain in multiple areas that warrants recovery intervals for pain control. Pt responds very well to TKE stretches with immediate improvement in standing TKE and surface pressure of anterior residual limb. Added TKE stretches to HEP. Pt will benefit from skilled therapy to assist pt with transfer training, mobility, gait, and  strength training to improve overall tolerance to ambulation and improve current QoL and return as close to prior level of function.    OBJECTIVE IMPAIRMENTS: Abnormal gait, decreased activity tolerance, decreased balance, decreased endurance, decreased knowledge of use of DME, decreased mobility, difficulty walking, decreased ROM, decreased strength, prosthetic dependency , and pain.   ACTIVITY LIMITATIONS: carrying, lifting, bending, standing, squatting, stairs, transfers, bathing, toileting, dressing, hygiene/grooming, and caring for others  PARTICIPATION LIMITATIONS: meal prep, cleaning, laundry, driving, shopping, community activity, occupation, and yard work  PERSONAL FACTORS: Age, Behavior pattern, Fitness, Past/current experiences, Profession, Time since onset of injury/illness/exacerbation, and 3+ comorbidities: anemia, breast cancer, HTN, PE's are also affecting patient's functional outcome.   REHAB POTENTIAL: Good  CLINICAL DECISION MAKING: Evolving/moderate complexity  EVALUATION COMPLEXITY: Moderate  PLAN:  PT FREQUENCY: 2x/week  PT DURATION: 12 weeks  PLANNED INTERVENTIONS: 97750- Physical Performance Testing, 97110-Therapeutic exercises, 97530- Therapeutic activity, 97112- Neuromuscular re-education, 97535- Self Care, [id number]- Manual therapy, [phone]- Gait training, [phone]- Orthotic/Prosthetic subsequent, Patient/Family education, Balance training, Stair training, Joint mobilization, and DME instructions  PLAN FOR NEXT SESSION:    4:28 PM, 05/14/24 Peggye JAYSON Linear, PT, DPT Physical Therapist - [facility]  Outpatient Physical Therapy- Main Campus [phone]

## 2024-05-15 ENCOUNTER — Inpatient Hospital Stay

## 2024-05-15 ENCOUNTER — Inpatient Hospital Stay: Attending: Internal Medicine

## 2024-05-15 ENCOUNTER — Encounter: Payer: Self-pay | Admitting: Internal Medicine

## 2024-05-15 ENCOUNTER — Inpatient Hospital Stay: Admitting: Internal Medicine

## 2024-05-15 VITALS — BP 106/73 | HR 79 | Temp 97.8°F | Resp 16 | Ht 64.0 in | Wt 181.9 lb

## 2024-05-15 DIAGNOSIS — D500 Iron deficiency anemia secondary to blood loss (chronic): Secondary | ICD-10-CM | POA: Insufficient documentation

## 2024-05-15 DIAGNOSIS — Z170 Estrogen receptor positive status [ER+]: Secondary | ICD-10-CM | POA: Insufficient documentation

## 2024-05-15 DIAGNOSIS — Z79899 Other long term (current) drug therapy: Secondary | ICD-10-CM | POA: Diagnosis not present

## 2024-05-15 DIAGNOSIS — D72829 Elevated white blood cell count, unspecified: Secondary | ICD-10-CM | POA: Diagnosis not present

## 2024-05-15 DIAGNOSIS — Z7901 Long term (current) use of anticoagulants: Secondary | ICD-10-CM | POA: Diagnosis not present

## 2024-05-15 DIAGNOSIS — C50411 Malignant neoplasm of upper-outer quadrant of right female breast: Secondary | ICD-10-CM | POA: Insufficient documentation

## 2024-05-15 DIAGNOSIS — N951 Menopausal and female climacteric states: Secondary | ICD-10-CM | POA: Diagnosis not present

## 2024-05-15 DIAGNOSIS — F17210 Nicotine dependence, cigarettes, uncomplicated: Secondary | ICD-10-CM | POA: Diagnosis not present

## 2024-05-15 DIAGNOSIS — Z1721 Progesterone receptor positive status: Secondary | ICD-10-CM | POA: Insufficient documentation

## 2024-05-15 DIAGNOSIS — Z8616 Personal history of COVID-19: Secondary | ICD-10-CM | POA: Diagnosis not present

## 2024-05-15 DIAGNOSIS — Z1732 Human epidermal growth factor receptor 2 negative status: Secondary | ICD-10-CM | POA: Diagnosis not present

## 2024-05-15 DIAGNOSIS — I2699 Other pulmonary embolism without acute cor pulmonale: Secondary | ICD-10-CM

## 2024-05-15 DIAGNOSIS — Z803 Family history of malignant neoplasm of breast: Secondary | ICD-10-CM | POA: Insufficient documentation

## 2024-05-15 DIAGNOSIS — Z89512 Acquired absence of left leg below knee: Secondary | ICD-10-CM | POA: Insufficient documentation

## 2024-05-15 DIAGNOSIS — I10 Essential (primary) hypertension: Secondary | ICD-10-CM | POA: Diagnosis not present

## 2024-05-15 DIAGNOSIS — D75839 Thrombocytosis, unspecified: Secondary | ICD-10-CM | POA: Insufficient documentation

## 2024-05-15 LAB — CBC WITH DIFFERENTIAL (CANCER CENTER ONLY)
Abs Immature Granulocytes: 0.08 K/uL — ABNORMAL HIGH (ref 0.00–0.07)
Basophils Absolute: 0.1 K/uL (ref 0.0–0.1)
Basophils Relative: 1 %
Eosinophils Absolute: 0.6 K/uL — ABNORMAL HIGH (ref 0.0–0.5)
Eosinophils Relative: 5 %
HCT: 38.7 % (ref 36.0–46.0)
Hemoglobin: 12.4 g/dL (ref 12.0–15.0)
Immature Granulocytes: 1 %
Lymphocytes Relative: 14 %
Lymphs Abs: 2 K/uL (ref 0.7–4.0)
MCH: 29.4 pg (ref 26.0–34.0)
MCHC: 32 g/dL (ref 30.0–36.0)
MCV: 91.7 fL (ref 80.0–100.0)
Monocytes Absolute: 0.7 K/uL (ref 0.1–1.0)
Monocytes Relative: 5 %
Neutro Abs: 10.4 K/uL — ABNORMAL HIGH (ref 1.7–7.7)
Neutrophils Relative %: 74 %
Platelet Count: 432 K/uL — ABNORMAL HIGH (ref 150–400)
RBC: 4.22 MIL/uL (ref 3.87–5.11)
RDW: 17.6 % — ABNORMAL HIGH (ref 11.5–15.5)
WBC Count: 13.9 K/uL — ABNORMAL HIGH (ref 4.0–10.5)
nRBC: 0 % (ref 0.0–0.2)

## 2024-05-15 LAB — BASIC METABOLIC PANEL - CANCER CENTER ONLY
Anion gap: 9 (ref 5–15)
BUN: 21 mg/dL — ABNORMAL HIGH (ref 6–20)
CO2: 25 mmol/L (ref 22–32)
Calcium: 9.3 mg/dL (ref 8.9–10.3)
Chloride: 101 mmol/L (ref 98–111)
Creatinine: 1.14 mg/dL — ABNORMAL HIGH (ref 0.44–1.00)
GFR, Estimated: 57 mL/min — ABNORMAL LOW (ref >60)
Glucose, Bld: 103 mg/dL — ABNORMAL HIGH (ref 70–99)
Potassium: 4.2 mmol/L (ref 3.5–5.1)
Sodium: 135 mmol/L (ref 135–145)

## 2024-05-15 LAB — IRON AND TIBC
Iron: 19 ug/dL — ABNORMAL LOW (ref 28–170)
Saturation Ratios: 9 % — ABNORMAL LOW (ref 10.4–31.8)
TIBC: 214 ug/dL — ABNORMAL LOW (ref 250–450)
UIBC: 195 ug/dL

## 2024-05-15 LAB — FERRITIN: Ferritin: 218 ng/mL (ref 11–307)

## 2024-05-15 NOTE — Progress Notes (Signed)
 Needs refill eliquis, pended.  MRI l-spine and T-spine 03/22/24, Mammogram 03/02/24, and CT angio 02/16/24. Seen pain clinic for pain injection.

## 2024-05-15 NOTE — Progress Notes (Signed)
 [facility] CONSULT NOTE  Patient Care Team: Center, [facility] as PCP - General (General Practice) Chipper Viviane POUR, MD as Consulting Physician (Psychiatry) Rennie Penny SAUNDERS, MD as Consulting Physician (Oncology)  CHIEF COMPLAINTS/PURPOSE OF CONSULTATION:  Breast cancer/anemia/ DVT-PE  #  Oncology History Overview Note  # JAN 2020- INVASIVE LOBULAR CARCINOMA, WITH FEATURES. pT2 (68mm)psN0; (Dr.Cintron ) Stage IA; ER/PR >90%; her-2 neu-NEG.  #  RT finish May 30th; oncotype RS score- 11; 3% DR at 8 years; No adjuvant chemo  # June 1st 2020- Start Tam [pre-menopausal]; STOPPED in 2022- sec to mood swings/psyche-LOST TO FOLLOW UP.   # HTN/ Covid positive [April 2020]  #S/p genetic counseling-declined testing [low risk/no children]     Carcinoma of upper-outer quadrant of right breast in female, estrogen receptor positive (HCC)  09/20/2018 Initial Diagnosis   Carcinoma of upper-outer quadrant of right breast in female, estrogen receptor positive (HCC)      HISTORY OF PRESENTING ILLNESS: Patient ambulating- with assistance-name wheelchair.  With family.    Penny Chase 53 y.o.  female  Stage I ER/PR pos her 2 Neg Breast cancer -  discontinued tamoxifen  for psychiatric reasons in 2022;  march-April 2025-multiple subsegmental PEs, lower extremity arterial occlusion status post thrombectomy- LEFT BKA  currently on Eliquis sec is here for a follow up- and review the mammogram.   Chronic pain- s/p injection- not improved- also s/p Injections. MRI T/L spine- chronic arthritis.   Patient s/p iron  infusions.  Notes improvement of her breathing symptoms; and improvement of fatigue..  Denies any blood in stools or black-colored stool.  Any nausea or vomiting.  Review of Systems  Constitutional:  Negative for chills, diaphoresis, fever, malaise/fatigue and weight loss.  HENT:  Negative for nosebleeds and sore throat.   Eyes:  Negative for double vision.   Respiratory:  Negative for cough, hemoptysis, sputum production, shortness of breath and wheezing.   Cardiovascular:  Negative for chest pain, palpitations, orthopnea and leg swelling.  Gastrointestinal:  Negative for abdominal pain, blood in stool, constipation, diarrhea, heartburn, melena, nausea and vomiting.  Musculoskeletal:  Positive for back pain, joint pain and myalgias.  Skin: Negative.  Negative for itching and rash.  Neurological:  Negative for dizziness, tingling, focal weakness, weakness and headaches.  Endo/Heme/Allergies:  Does not bruise/bleed easily.  Psychiatric/Behavioral:  Positive for depression. The patient is nervous/anxious and has insomnia.      MEDICAL HISTORY:  Past Medical History:  Diagnosis Date   Acute lower limb ischemia    Anemia    Breast cancer (HCC)    Cancer (HCC) 09/2018   Right breast   COVID-19 11/2018   Family history of breast cancer    GERD (gastroesophageal reflux disease)    Hypertension    Personal history of radiation therapy 2020   Rt breast   Pulmonary embolism (HCC)     SURGICAL HISTORY: Past Surgical History:  Procedure Laterality Date   AMPUTATION Left 11/26/2023   Procedure: AMPUTATION BELOW KNEE;  Surgeon: Jama Cordella MATSU, MD;  Location: [facility] ORS;  Service: Vascular;  Laterality: Left;   BREAST BIOPSY Right 09/12/2018   us  core/venus clip-INVASIVE MAMMARYLobular CARCINOMA, WITH LOBULAR FEATURES   BREAST LUMPECTOMY Right 10/04/2018   BREAST LUMPECTOMY WITH NEEDLE LOCALIZATION AND AXILLARY SENTINEL LYMPH NODE BX Right 10/04/2018   Procedure: BREAST LUMPECTOMY WITH NEEDLE LOCALIZATION AND AXILLARY SENTINEL LYMPH NODE BX;  Surgeon: Rodolph Romano, MD;  Location: [facility] ORS;  Service: General;  Laterality: Right;  COLONOSCOPY N/A 11/28/2023   Procedure: COLONOSCOPY;  Surgeon: Jinny Carmine, MD;  Location: [facility] ENDOSCOPY;  Service: Endoscopy;  Laterality: N/A;   ESOPHAGOGASTRODUODENOSCOPY N/A 11/28/2023   Procedure: EGD  (ESOPHAGOGASTRODUODENOSCOPY);  Surgeon: Jinny Carmine, MD;  Location: [facility] ENDOSCOPY;  Service: Endoscopy;  Laterality: N/A;   ESOPHAGOGASTRODUODENOSCOPY N/A 04/02/2024   Procedure: EGD (ESOPHAGOGASTRODUODENOSCOPY);  Surgeon: Therisa Bi, MD;  Location: [facility] ENDOSCOPY;  Service: Gastroenterology;  Laterality: N/A;   GIVENS CAPSULE STUDY N/A 11/28/2023   Procedure: IMAGING PROCEDURE, GI TRACT, INTRALUMINAL, VIA CAPSULE;  Surgeon: Jinny Carmine, MD;  Location: [facility] ENDOSCOPY;  Service: Endoscopy;  Laterality: N/A;   IVC FILTER INSERTION N/A 11/28/2023   Procedure: IVC FILTER INSERTION;  Surgeon: Jama Cordella MATSU, MD;  Location: [facility] INVASIVE CV LAB;  Service: Cardiovascular;  Laterality: N/A;   LOWER EXTREMITY ANGIOGRAPHY Left 11/24/2023   Procedure: Lower Extremity Angiography;  Surgeon: Jama Cordella MATSU, MD;  Location: [facility] INVASIVE CV LAB;  Service: Cardiovascular;  Laterality: Left;   TONSILLECTOMY      SOCIAL HISTORY: Social History   Socioeconomic History   Marital status: Married    Spouse name: Not on file   Number of children: Not on file   Years of education: Not on file   Highest education level: Not on file  Occupational History   Not on file  Tobacco Use   Smoking status: Every Day    Current packs/day: 1.00    Average packs/day: 1 pack/day for 15.0 years (15.0 ttl pk-yrs)    Types: Cigarettes   Smokeless tobacco: Never   Tobacco comments:    patient declined  Vaping Use   Vaping status: Former  Substance and Sexual Activity   Alcohol use: Not Currently    Comment: not taken for about 6 weeks   Drug use: Not Currently    Types: Marijuana    Comment: Uses street opioids   Sexual activity: Not Currently  Other Topics Concern   Not on file  Social History Narrative   Works at [employer];  Smoke 15 cig/day; No alcohol abuse; in [employer]; with husband.    Social Drivers of Corporate investment banker Strain: Low Risk  (03/27/2024)   Received from  [facility]   Overall Financial Resource Strain (CARDIA)    Difficulty of Paying Living Expenses: Not hard at all  Food Insecurity: No Food Insecurity (03/27/2024)   Received from [facility]   Hunger Vital Sign    Within the past 12 months, you worried that your food would run out before you got the money to buy more.: Never true    Within the past 12 months, the food you bought just didn't last and you didn't have money to get more.: Never true  Transportation Needs: No Transportation Needs (03/27/2024)   Received from [facility]    In the past 12 months, has lack of transportation kept you from medical appointments or from getting medications?: No    Lack of Transportation (Non-Medical): No  Physical Activity: Not on file  Stress: Not on file  Social Connections: Not on file  Intimate Partner Violence: Not At Risk (02/14/2024)   Humiliation, Afraid, Rape, and Kick questionnaire    Fear of Current or Ex-Partner: No    Emotionally Abused: No    Physically Abused: No    Sexually Abused: No    FAMILY HISTORY: Family History  Problem Relation Age of Onset  Diabetes Mother    Atrial fibrillation Father    Breast cancer Paternal Aunt     ALLERGIES:  is allergic to amlodipine .  MEDICATIONS:  Current Outpatient Medications  Medication Sig Dispense Refill   acetaminophen  (TYLENOL ) 500 MG tablet Take 500 mg by mouth every 6 (six) hours as needed for moderate pain (pain score 4-6).     apixaban (ELIQUIS) 5 MG TABS tablet Take 5 mg by mouth 2 (two) times daily.     atorvastatin (LIPITOR) 20 MG tablet Take 20 mg by mouth at bedtime.     celecoxib (CELEBREX) 100 MG capsule Take 100 mg by mouth 2 (two) times daily.     cyanocobalamin  1000 MCG tablet Take 1 tablet (1,000 mcg total) by mouth daily. 30 tablet 2   folic acid  (FOLVITE ) 1 MG tablet Take 1 tablet (1 mg total) by mouth daily. 30 tablet 0   gabapentin   (NEURONTIN ) 600 MG tablet Take 1 tablet (600 mg total) by mouth 3 (three) times daily. 90 tablet 0   iron  polysaccharides (NIFEREX) 150 MG capsule Take 1 capsule (150 mg total) by mouth daily. 30 capsule 0   lisinopril (ZESTRIL) 20 MG tablet Take 20 mg by mouth daily.     oxyCODONE  (OXY IR/ROXICODONE ) 5 MG immediate release tablet Take 1 tablet (5 mg total) by mouth every 6 (six) hours as needed for severe pain (pain score 7-10). 46 tablet 0   pantoprazole  (PROTONIX ) 40 MG tablet Take 1 tablet (40 mg total) by mouth 2 (two) times daily before a meal. (Patient taking differently: Take 40 mg by mouth daily.) 60 tablet 1   venlafaxine  XR (EFFEXOR -XR) 37.5 MG 24 hr capsule TAKE 1 CAPSULE BY MOUTH DAILY WITH BREAKFAST. 90 capsule 2   tiZANidine (ZANAFLEX) 2 MG tablet Take 2 mg by mouth every 6 (six) hours as needed for muscle spasms.     No current facility-administered medications for this visit.      SABRA  PHYSICAL EXAMINATION: ECOG PERFORMANCE STATUS: 0 - Asymptomatic  Vitals:   05/15/24 1413  BP: 106/73  Pulse: 79  Resp: 16  Temp: 97.8 F (36.6 C)  SpO2: 100%   Filed Weights   05/15/24 1413  Weight: 181 lb 14.4 oz (82.5 kg)   Left lower extremity BKA  Physical Exam HENT:     Head: Normocephalic and atraumatic.     Mouth/Throat:     Pharynx: No oropharyngeal exudate.  Eyes:     Pupils: Pupils are equal, round, and reactive to light.  Cardiovascular:     Rate and Rhythm: Normal rate and regular rhythm.  Pulmonary:     Effort: Pulmonary effort is normal. No respiratory distress.     Breath sounds: Normal breath sounds. No wheezing.  Abdominal:     General: Bowel sounds are normal. There is no distension.     Palpations: Abdomen is soft. There is no mass.     Tenderness: There is no abdominal tenderness. There is no guarding or rebound.  Musculoskeletal:        General: No tenderness. Normal range of motion.     Cervical back: Normal range of motion and neck supple.   Skin:    General: Skin is warm.  Neurological:     Mental Status: She is alert and oriented to person, place, and time.  Psychiatric:        Mood and Affect: Affect normal.     Comments: Anxious/tearful      LABORATORY DATA:  I  have reviewed the data as listed Lab Results  Component Value Date   WBC 13.9 (H) 05/15/2024   HGB 12.4 05/15/2024   HCT 38.7 05/15/2024   MCV 91.7 05/15/2024   PLT 432 (H) 05/15/2024   Recent Labs    11/23/23 0949 11/24/23 0254 11/25/23 0816 01/17/24 1312 02/14/24 1328 05/15/24 1414  NA 135 133*   < > 137 135 135  K 3.9 3.8   < > 4.1 4.3 4.2  CL 102 102   < > 103 98 101  CO2 23 21*   < > 26 27 25   GLUCOSE 112* 124*   < > 95 105* 103*  BUN 14 15   < > 14 13 21*  CREATININE 0.78 0.68   < > 0.83 0.85 1.14*  CALCIUM  8.7* 8.3*   < > 8.6* 9.7 9.3  GFRNONAA >60 >60   < > >60 >60 57*  PROT 7.6 6.6  --   --   --   --   ALBUMIN 2.9* 2.5*  --   --   --   --   AST 38 28  --   --   --   --   ALT 33 27  --   --   --   --   ALKPHOS 111 97  --   --   --   --   BILITOT 0.3 0.5  --   --   --   --    < > = values in this interval not displayed.    RADIOGRAPHIC STUDIES: I have personally reviewed the radiological images as listed and agreed with the findings in the report. VAS US  ABI WITH/WO TBI Result Date: 05/07/2024  LOWER EXTREMITY DOPPLER STUDY Patient Name:  Penny Chase  Date of Exam:   05/03/2024 Medical Rec #: [id number]       Accession #:    [id number] Date of Birth: 1971-01-29       Patient Gender: F Patient Age:   84 years Exam Location:  [facility] Procedure:      VAS US  ABI WITH/WO TBI Referring Phys: Cordella Shawl --------------------------------------------------------------------------------  Indications: Peripheral artery disease. left BKA  Comparison Study: 03/23/2024 Performing Technologist: Leafy Gibes RVS  Examination Guidelines: A complete evaluation includes at minimum, Doppler waveform signals and systolic blood  pressure reading at the level of bilateral brachial, anterior tibial, and posterior tibial arteries, when vessel segments are accessible. Bilateral testing is considered an integral part of a complete examination. Photoelectric Plethysmograph (PPG) waveforms and toe systolic pressure readings are included as required and additional duplex testing as needed. Limited examinations for reoccurring indications may be performed as noted.  ABI Findings: +---------+------------------+-----+---------+--------+ Right    Rt Pressure (mmHg)IndexWaveform Comment  +---------+------------------+-----+---------+--------+ Brachial 124                                      +---------+------------------+-----+---------+--------+ ATA      122               0.98 triphasic         +---------+------------------+-----+---------+--------+ PTA      140               1.13 triphasic         +---------+------------------+-----+---------+--------+ Great Toe116               0.94 Normal            +---------+------------------+-----+---------+--------+ +--------+------------------+-----+--------+-------+  Left    Lt Pressure (mmHg)IndexWaveformComment +--------+------------------+-----+--------+-------+ [id number]                                    +--------+------------------+-----+--------+-------+ ATA                                    Lt BKA  +--------+------------------+-----+--------+-------+ +-------+-----------+-----------+------------+------------+ ABI/TBIToday's ABIToday's TBIPrevious ABIPrevious TBI +-------+-----------+-----------+------------+------------+ Right  1.13       .94        1.06        .21          +-------+-----------+-----------+------------+------------+ Left   Lt BKA                Lt BKA                   +-------+-----------+-----------+------------+------------+ Right ABIs appear essentially unchanged compared to prior study on 03/23/2024. Right TBIs  appear increased compared to prior study on 03/23/2024.  Summary: Right: Resting right ankle-brachial index is within normal range. The right toe-brachial index is normal.  Left:  Lt BKA.  *See table(s) above for measurements and observations.  Electronically signed by Cordella Shawl MD on 05/07/2024 at 10:41:34 AM.    Final    DG PAIN CLINIC C-ARM 1-60 MIN NO REPORT Result Date: 04/30/2024 Fluoro was used, but no Radiologist interpretation will be provided. Please refer to NOTES tab for provider progress note.    ASSESSMENT & PLAN:   Carcinoma of upper-outer quadrant of right breast in female, estrogen receptor positive (HCC) # 2020- Stage IA ER PR positive HER-2/neu negative.;  Oncotype low risk - on Tamoxifen .  Stable mammogram Feb 2022- WNL; OFF Tamoxifen  since 2022.  patient DISCONTINUED TAMOXIFEN -because of psychiatric complications/mood swings.  JULY 2025- mammogram- WNL- HOLD OFF any further endocrine.  therapy.   # Hx Symptomatic anemia-secondary to recent GI bleed/history of colitis-hemoglobin today 8 s/p iron  infusions- Hb today 14- HOLD off any venofer  at this time.   # Mild leukocytosis/thrombocytosis-likely reactive smoking no concern for MPN.  Monitor for now.  # MARCH 2025- [facility] multiple subsegmental PE-left lower extremity arterial ischemia -DVT-status post thrombectomy status post intervention at [facility]-currently status post BKA of the left lower extremity.  Hypercoagulable workup-April 2025-unremarkable protein C slightly low expected from acute events. Currently ON  Eliquis [Dr.Schnier]-   # active smoker: Discussed with the patient regarding the ill effects of smoking- including but not limited to cardiac lung and vascular diseases and malignancies. Counseled against smoking; patient-not interested in quitting.   # Hot flashes- post menopausal- recommend/start effexor  37.mg XR- q day; stop celexa  [depression]-   # DISPOSITION: # HOLD venofer - today # follow up in 6 months-  lab- cbc/bmp; iron  studies;ferritin; possible venofer - Dr.B    All questions were answered. The patient knows to call the clinic with any problems, questions or concerns.    Penny JONELLE Joe, MD 05/15/2024 3:27 PM

## 2024-05-15 NOTE — Assessment & Plan Note (Addendum)
#   2020- Stage IA ER PR positive HER-2/neu negative.;  Oncotype low risk - on Tamoxifen .  Stable mammogram Feb 2022- WNL; OFF Tamoxifen  since 2022.  patient DISCONTINUED TAMOXIFEN -because of psychiatric complications/mood swings.  JULY 2025- mammogram- WNL- HOLD OFF any further endocrine.  therapy.   # Hx Symptomatic anemia-secondary to recent GI bleed/history of colitis-hemoglobin today 8 s/p iron  infusions- Hb today 14- HOLD off any venofer  at this time.   # Mild leukocytosis/thrombocytosis-likely reactive smoking no concern for MPN.  Monitor for now.  # MARCH 2025- [facility] multiple subsegmental PE-left lower extremity arterial ischemia -DVT-status post thrombectomy status post intervention at [facility]-currently status post BKA of the left lower extremity.  Hypercoagulable workup-April 2025-unremarkable protein C slightly low expected from acute events. Currently ON  Eliquis [Dr.Schnier]-   # active smoker: Discussed with the patient regarding the ill effects of smoking- including but not limited to cardiac lung and vascular diseases and malignancies. Counseled against smoking; patient-not interested in quitting.   # Hot flashes- post menopausal- recommend/start effexor  37.mg XR- q day; stop celexa  [depression]-   # DISPOSITION: # HOLD venofer - today # follow up in 6 months- lab- cbc/bmp; iron  studies;ferritin; possible venofer - Dr.B

## 2024-05-16 ENCOUNTER — Telehealth: Payer: Self-pay | Admitting: *Deleted

## 2024-05-16 ENCOUNTER — Ambulatory Visit: Attending: Nurse Practitioner | Admitting: Physical Therapy

## 2024-05-16 DIAGNOSIS — M6281 Muscle weakness (generalized): Secondary | ICD-10-CM | POA: Insufficient documentation

## 2024-05-16 DIAGNOSIS — R262 Difficulty in walking, not elsewhere classified: Secondary | ICD-10-CM | POA: Diagnosis present

## 2024-05-16 NOTE — Therapy (Signed)
 OUTPATIENT PHYSICAL THERAPY TREATMENT  Patient Name: Penny Chase MRN: [id number] DOB:01-24-1971, 53 y.o., female Today's Date: 05/16/2024  PCP: [facility] REFERRING PROVIDER: Delores Orvin BRAVO, NP  END OF SESSION:  PT End of Session - 05/16/24 1447     Visit Number 4    Number of Visits 25    Date for Recertification  07/25/24    Authorization Type [employer]et[employer]    Progress Note Due on Visit 10    PT Start Time 1448    PT Stop Time 1528    PT Time Calculation (min) 40 min    Equipment Utilized During Treatment Gait belt    Activity Tolerance Patient tolerated treatment well;No increased pain    Behavior During Therapy [facility] for tasks assessed/performed           Past Medical History:  Diagnosis Date   Acute lower limb ischemia    Anemia    Breast cancer (HCC)    Cancer (HCC) 09/2018   Right breast   COVID-19 11/2018   Family history of breast cancer    GERD (gastroesophageal reflux disease)    Hypertension    Personal history of radiation therapy 2020   Rt breast   Pulmonary embolism [facility])    Past Surgical History:  Procedure Laterality Date   AMPUTATION Left 11/26/2023   Procedure: AMPUTATION BELOW KNEE;  Surgeon: Jama Cordella MATSU, MD;  Location: [facility] ORS;  Service: Vascular;  Laterality: Left;   BREAST BIOPSY Right 09/12/2018   us  core/venus clip-INVASIVE MAMMARYLobular CARCINOMA, WITH LOBULAR FEATURES   BREAST LUMPECTOMY Right 10/04/2018   BREAST LUMPECTOMY WITH NEEDLE LOCALIZATION AND AXILLARY SENTINEL LYMPH NODE BX Right 10/04/2018   Procedure: BREAST LUMPECTOMY WITH NEEDLE LOCALIZATION AND AXILLARY SENTINEL LYMPH NODE BX;  Surgeon: Rodolph Romano, MD;  Location: [facility] ORS;  Service: General;  Laterality: Right;   COLONOSCOPY N/A 11/28/2023   Procedure: COLONOSCOPY;  Surgeon: Jinny Carmine, MD;  Location: [facility] ENDOSCOPY;  Service: Endoscopy;  Laterality: N/A;   ESOPHAGOGASTRODUODENOSCOPY N/A 11/28/2023   Procedure: EGD  (ESOPHAGOGASTRODUODENOSCOPY);  Surgeon: Jinny Carmine, MD;  Location: [facility] ENDOSCOPY;  Service: Endoscopy;  Laterality: N/A;   ESOPHAGOGASTRODUODENOSCOPY N/A 04/02/2024   Procedure: EGD (ESOPHAGOGASTRODUODENOSCOPY);  Surgeon: Therisa Bi, MD;  Location: [facility] ENDOSCOPY;  Service: Gastroenterology;  Laterality: N/A;   GIVENS CAPSULE STUDY N/A 11/28/2023   Procedure: IMAGING PROCEDURE, GI TRACT, INTRALUMINAL, VIA CAPSULE;  Surgeon: Jinny Carmine, MD;  Location: [facility] ENDOSCOPY;  Service: Endoscopy;  Laterality: N/A;   IVC FILTER INSERTION N/A 11/28/2023   Procedure: IVC FILTER INSERTION;  Surgeon: Jama Cordella MATSU, MD;  Location: [facility] INVASIVE CV LAB;  Service: Cardiovascular;  Laterality: N/A;   LOWER EXTREMITY ANGIOGRAPHY Left 11/24/2023   Procedure: Lower Extremity Angiography;  Surgeon: Jama Cordella MATSU, MD;  Location: [facility] INVASIVE CV LAB;  Service: Cardiovascular;  Laterality: Left;   TONSILLECTOMY     Patient Active Problem List   Diagnosis Date Noted   Lumbar spondylosis 03/08/2024   Complex regional pain syndrome type 2 of left lower extremity 03/08/2024   Atherosclerosis of native arteries of extremity with intermittent claudication 03/02/2024   Phantom limb syndrome with pain (HCC) 03/01/2024   Diverticulitis of colon 03/01/2024   Pain 02/13/2024   Hx of left BKA (HCC) 01/25/2024   Symptomatic anemia 12/01/2023   Multiple gastric ulcers 11/28/2023   Ischemic colitis 11/28/2023   Bloody stools 11/26/2023   Rectal bleeding 11/26/2023   Acute blood loss anemia 11/26/2023   Blister of left foot 11/24/2023  Limb ischemia 11/23/2023   Gangrene of left foot (HCC) 11/23/2023   Tobacco abuse 11/23/2023   PE (pulmonary thromboembolism) (HCC) 11/23/2023   GI bleeding 11/23/2023   Substance abuse (HCC) 11/23/2023   Leukocytosis 11/23/2023   Bipolar affective disorder, current episode manic (HCC) 01/06/2021   Brief psychotic disorder (HCC) 01/03/2021   Family history of breast cancer     Carcinoma of upper-outer quadrant of right breast in female, estrogen receptor positive (HCC) 09/20/2018   Gastroesophageal reflux disease without esophagitis 01/04/2017   HTN (hypertension), benign 01/20/2014    ONSET DATE: 04/11/24  REFERRING DIAG: S10.487 (ICD-10-CM) - Hx of left BKA (HCC)   THERAPY DIAG:  Difficulty in walking, not elsewhere classified  Muscle weakness (generalized)  Rationale for Evaluation and Treatment: Rehabilitation  SUBJECTIVE:                                                                                                                                                                                             SUBJECTIVE STATEMENT:  Pt reports that she is still in a lot of pain in the back as well in distal residual limb. Reports that pain in so severe that she is having a hard time bearing any weight on the L residual limb at home. Also Feels like she may have bone spur on the end of residual limb.   Socket has been updated since last visit, [facility] also recommended more socks to aide in comfort.   Pt accompanied by: significant other, Doug  PERTINENT HISTORY: Patient is a 53 year old female with acute limb ischemia s/p left BKA. PE in association with lower GI bleed s/p IVC filter.   PAIN:  Are you having pain? 7/10 (right hip, low back, 7-8/10 anterior lef ttibia with prolonged weightbearing)   PRECAUTIONS: None  WEIGHT BEARING RESTRICTIONS: WBAT  FALLS: Has patient fallen in last 6 months? Yes. Number of falls 2  LIVING ENVIRONMENT: Lives with: lives with their spouse Lives in: House/apartment Stairs: Ramped entrance Has following equipment at home: Environmental consultant - 2 wheeled, Wheelchair (manual), and Architectural technologist  PLOF: Independent  PATIENT GOALS: Get out of the wheelchair and be able to get outside and do yard work that needs to be done.  OBJECTIVE:  Note: Objective measures were completed at Evaluation unless otherwise noted.  DIAGNOSTIC  FINDINGS:   EXAM: MRI LUMBAR SPINE WITHOUT CONTRAST  IMPRESSION: Generally mild for age lumbar spine degeneration. No spinal stenosis or convincing neural impingement.   However, small left-side annular fissures of the discs at L4-L5 and L5-S1, which might be a source for Left L5 and/or S1 radiculitis (  respectively).   LOWER EXTREMITY MMT:    MMT Right Eval Left Eval  Hip flexion 4 5  Hip extension    Hip abduction    Hip adduction    Hip internal rotation    Hip external rotation    Knee flexion 5 3+  Knee extension 5 3+  Ankle dorsiflexion    Ankle plantarflexion    Ankle inversion    Ankle eversion    (Blank rows = not tested)                                                                                                                              TREATMENT DATE: 05/16/24  Seated HS stretch 2 x 1 min LLE.   Standing in parallel bars:  Weight shift R and L x 15 bil  Gait in parallel bars x 1 lap  2 laps with supervision assist. Instruction to improve posture to reduce anterior forces into residual limb from flexed hip positioning as well as increased terminal knee extension in stance. Slightly improved tolerance to WB with cues from PT.   Seated therex:  LAQ x 12  HS curl x LLE only 12 RTB reports pain in incision and posterior limb.   Hip abduction x 12 bil RTB  Hip flexion RTB x  10 isometric hip adduction x 15   Sit<>stand with RW x 6 with cues for symmetry of foot placement and terminal knee extension on the in stance.   PATIENT EDUCATION: Education details: Pt educated on role of PT and services provided during current POC, along with prognosis and information about the clinic. Person educated: Patient and Spouse Education method: Explanation Education comprehension: verbalized understanding  HOME EXERCISE PROGRAM: Access Code: [email or web address] URL: [email or web address] Date: 05/14/2024 Prepared by: Peggye Linear  Exercises - Forward and  backward step Right leg   - 1 x daily - 7 x weekly - 3 sets - 15 reps - Standing Hip Abduction with Counter Support  - 1 x daily - 7 x weekly - 2 sets - 10 reps - Mini Squat with Counter Support  - 1 x daily - 7 x weekly - 2 sets - 10 reps - Seated Hamstring Stretch  - 2 x daily - 1 sets - 2 reps - 45sec hold - Seated Knee Extension Stretch with Chair  - 2 x daily - 1 sets - 2 reps - 45sec hold - Supine Knee Extension Stretch on Towel Roll  - 2 x daily - 1 sets - 2 reps - 90 hold  GOALS: Goals reviewed with patient? Yes  SHORT TERM GOALS: Target date: 05/30/2024  Pt will be independent with HEP in order to demonstrate increased ability to perform tasks related to occupation/hobbies. Baseline: Goal status: INITIAL  LONG TERM GOALS: Target date: 07/25/2024  1.  Patient (> 78 years old) will complete five times sit to stand test in < 15 seconds indicating an increased LE strength and improved  balance. Baseline: 11.36 sec Goal status: INITIAL   2.  Patient will reduce timed up and go to <11 seconds to reduce fall risk and demonstrate improved transfer/gait ability. Baseline: 19.53 sec Goal status: INITIAL  3.  Patient will increase 10 meter walk test to >1.55m/s as to improve gait speed for better community ambulation and to reduce fall risk. Baseline: TBD Goal status: INITIAL  4.  Patient will increase six minute walk test distance to >1000 for progression to community ambulator and improve gait ability. Baseline: 112 feet in 2 min and 8 sec  Goal status: INITIAL  5.  Pt will improve their Locomotor Capabilities Index (LCI-5) score by at least 7 points (MCID), demonstrating a clinically meaningful increase in ability to perform basic locomotor tasks with assistance and/or use of aids. Baseline: TBD Goal Status: INITIAL  ASSESSMENT:  CLINICAL IMPRESSION: Continued to advance progressive loading in stance and advancing weight shifting. Was noted to have improved tolerance to WB  through the LLE. Reports that she is concerned that she has bone spurs on the distal residual limb. Encouraged slowed progression of wear schedule for 2-3 weeks per increased time in prosthetic to allow gradual acclimation to WB in residual limb. Pt will benefit from skilled therapy to assist pt with transfer training, mobility, gait, and strength training to improve overall tolerance to ambulation and improve current QoL and return as close to prior level of function.    OBJECTIVE IMPAIRMENTS: Abnormal gait, decreased activity tolerance, decreased balance, decreased endurance, decreased knowledge of use of DME, decreased mobility, difficulty walking, decreased ROM, decreased strength, prosthetic dependency , and pain.   ACTIVITY LIMITATIONS: carrying, lifting, bending, standing, squatting, stairs, transfers, bathing, toileting, dressing, hygiene/grooming, and caring for others  PARTICIPATION LIMITATIONS: meal prep, cleaning, laundry, driving, shopping, community activity, occupation, and yard work  PERSONAL FACTORS: Age, Behavior pattern, Fitness, Past/current experiences, Profession, Time since onset of injury/illness/exacerbation, and 3+ comorbidities: anemia, breast cancer, HTN, PE's are also affecting patient's functional outcome.   REHAB POTENTIAL: Good  CLINICAL DECISION MAKING: Evolving/moderate complexity  EVALUATION COMPLEXITY: Moderate  PLAN:  PT FREQUENCY: 2x/week  PT DURATION: 12 weeks  PLANNED INTERVENTIONS: 97750- Physical Performance Testing, 97110-Therapeutic exercises, 97530- Therapeutic activity, 97112- Neuromuscular re-education, 97535- Self Care, [id number]- Manual therapy, [phone]- Gait training, [phone]- Orthotic/Prosthetic subsequent, Patient/Family education, Balance training, Stair training, Joint mobilization, and DME instructions  PLAN FOR NEXT SESSION:   Standing tolerance with distraction  Massie Dollar PT, DPT  Physical Therapist - [facility]  5:31 PM 05/16/24

## 2024-05-16 NOTE — Telephone Encounter (Signed)
 In the notes of Dr. Rennie he says that she is getting the Eliquis from Dr.  [Dr.Schnier]..  I called the patient back and let her know that Dr. Rennie says that you are getting your medicine of Eliquis by Dr. Jama and you need to continue that.  She says okay-

## 2024-05-21 ENCOUNTER — Ambulatory Visit: Admitting: Physical Therapy

## 2024-05-21 DIAGNOSIS — R262 Difficulty in walking, not elsewhere classified: Secondary | ICD-10-CM

## 2024-05-21 DIAGNOSIS — M6281 Muscle weakness (generalized): Secondary | ICD-10-CM

## 2024-05-21 NOTE — Therapy (Signed)
 OUTPATIENT PHYSICAL THERAPY TREATMENT  Patient Name: Penny Chase MRN: [id number] DOB:02-19-1971, 53 y.o., female Today's Date: 05/21/2024  PCP: [facility] REFERRING PROVIDER: Delores Orvin BRAVO, NP  END OF SESSION:  PT End of Session - 05/21/24 1545     Visit Number 5    Number of Visits 25    Date for Recertification  07/25/24    Authorization Type Aet[employer]    Progress Note Due on Visit 10    PT Start Time 1544    PT Stop Time 1615    PT Time Calculation (min) 31 min    Equipment Utilized During Treatment Gait belt    Activity Tolerance Patient tolerated treatment well;No increased pain    Behavior During Therapy [facility] for tasks assessed/performed           Past Medical History:  Diagnosis Date   Acute lower limb ischemia    Anemia    Breast cancer (HCC)    Cancer (HCC) 09/2018   Right breast   COVID-19 11/2018   Family history of breast cancer    GERD (gastroesophageal reflux disease)    Hypertension    Personal history of radiation therapy 2020   Rt breast   Pulmonary embolism [facility])    Past Surgical History:  Procedure Laterality Date   AMPUTATION Left 11/26/2023   Procedure: AMPUTATION BELOW KNEE;  Surgeon: Jama Cordella MATSU, MD;  Location: [facility] ORS;  Service: Vascular;  Laterality: Left;   BREAST BIOPSY Right 09/12/2018   us  core/venus clip-INVASIVE MAMMARYLobular CARCINOMA, WITH LOBULAR FEATURES   BREAST LUMPECTOMY Right 10/04/2018   BREAST LUMPECTOMY WITH NEEDLE LOCALIZATION AND AXILLARY SENTINEL LYMPH NODE BX Right 10/04/2018   Procedure: BREAST LUMPECTOMY WITH NEEDLE LOCALIZATION AND AXILLARY SENTINEL LYMPH NODE BX;  Surgeon: Rodolph Romano, MD;  Location: [facility] ORS;  Service: General;  Laterality: Right;   COLONOSCOPY N/A 11/28/2023   Procedure: COLONOSCOPY;  Surgeon: Jinny Carmine, MD;  Location: [facility] ENDOSCOPY;  Service: Endoscopy;  Laterality: N/A;   ESOPHAGOGASTRODUODENOSCOPY N/A 11/28/2023   Procedure: EGD  (ESOPHAGOGASTRODUODENOSCOPY);  Surgeon: Jinny Carmine, MD;  Location: [facility] ENDOSCOPY;  Service: Endoscopy;  Laterality: N/A;   ESOPHAGOGASTRODUODENOSCOPY N/A 04/02/2024   Procedure: EGD (ESOPHAGOGASTRODUODENOSCOPY);  Surgeon: Therisa Bi, MD;  Location: [facility] ENDOSCOPY;  Service: Gastroenterology;  Laterality: N/A;   GIVENS CAPSULE STUDY N/A 11/28/2023   Procedure: IMAGING PROCEDURE, GI TRACT, INTRALUMINAL, VIA CAPSULE;  Surgeon: Jinny Carmine, MD;  Location: [facility] ENDOSCOPY;  Service: Endoscopy;  Laterality: N/A;   IVC FILTER INSERTION N/A 11/28/2023   Procedure: IVC FILTER INSERTION;  Surgeon: Jama Cordella MATSU, MD;  Location: [facility] INVASIVE CV LAB;  Service: Cardiovascular;  Laterality: N/A;   LOWER EXTREMITY ANGIOGRAPHY Left 11/24/2023   Procedure: Lower Extremity Angiography;  Surgeon: Jama Cordella MATSU, MD;  Location: [facility] INVASIVE CV LAB;  Service: Cardiovascular;  Laterality: Left;   TONSILLECTOMY     Patient Active Problem List   Diagnosis Date Noted   Lumbar spondylosis 03/08/2024   Complex regional pain syndrome type 2 of left lower extremity 03/08/2024   Atherosclerosis of native arteries of extremity with intermittent claudication 03/02/2024   Phantom limb syndrome with pain (HCC) 03/01/2024   Diverticulitis of colon 03/01/2024   Pain 02/13/2024   Hx of left BKA (HCC) 01/25/2024   Symptomatic anemia 12/01/2023   Multiple gastric ulcers 11/28/2023   Ischemic colitis 11/28/2023   Bloody stools 11/26/2023   Rectal bleeding 11/26/2023   Acute blood loss anemia 11/26/2023   Blister of left foot 11/24/2023  Limb ischemia 11/23/2023   Gangrene of left foot (HCC) 11/23/2023   Tobacco abuse 11/23/2023   PE (pulmonary thromboembolism) (HCC) 11/23/2023   GI bleeding 11/23/2023   Substance abuse (HCC) 11/23/2023   Leukocytosis 11/23/2023   Bipolar affective disorder, current episode manic (HCC) 01/06/2021   Brief psychotic disorder (HCC) 01/03/2021   Family history of breast cancer     Carcinoma of upper-outer quadrant of right breast in female, estrogen receptor positive (HCC) 09/20/2018   Gastroesophageal reflux disease without esophagitis 01/04/2017   HTN (hypertension), benign 01/20/2014    ONSET DATE: 04/11/24  REFERRING DIAG: S10.487 (ICD-10-CM) - Hx of left BKA (HCC)   THERAPY DIAG:  Difficulty in walking, not elsewhere classified  Muscle weakness (generalized)  Rationale for Evaluation and Treatment: Rehabilitation  SUBJECTIVE:                                                                                                                                                                                             SUBJECTIVE STATEMENT:  Arrives late to scheduled PT treatment. Pt reports that she had a small fall over the weekend. Reports no residual pain, but is noted to have raccoon sign on the R side.  Has significantly reduced pain in the residual limb on this day    Pt accompanied by: significant other, Doug  PERTINENT HISTORY: Patient is a 53 year old female with acute limb ischemia s/p left BKA. PE in association with lower GI bleed s/p IVC filter.   PAIN:  Are you having pain? 7/10 (right hip, low back, 7-8/10 anterior lef ttibia with prolonged weightbearing)   PRECAUTIONS: None  WEIGHT BEARING RESTRICTIONS: WBAT  FALLS: Has patient fallen in last 6 months? Yes. Number of falls 2  LIVING ENVIRONMENT: Lives with: lives with their spouse Lives in: House/apartment Stairs: Ramped entrance Has following equipment at home: Environmental consultant - 2 wheeled, Wheelchair (manual), and Architectural technologist  PLOF: Independent  PATIENT GOALS: Get out of the wheelchair and be able to get outside and do yard work that needs to be done.  OBJECTIVE:  Note: Objective measures were completed at Evaluation unless otherwise noted.  DIAGNOSTIC FINDINGS:   EXAM: MRI LUMBAR SPINE WITHOUT CONTRAST  IMPRESSION: Generally mild for age lumbar spine degeneration. No spinal  stenosis or convincing neural impingement.   However, small left-side annular fissures of the discs at L4-L5 and L5-S1, which might be a source for Left L5 and/or S1 radiculitis (respectively).   LOWER EXTREMITY MMT:    MMT Right Eval Left Eval  Hip flexion 4 5  Hip extension    Hip abduction  Hip adduction    Hip internal rotation    Hip external rotation    Knee flexion 5 3+  Knee extension 5 3+  Ankle dorsiflexion    Ankle plantarflexion    Ankle inversion    Ankle eversion    (Blank rows = not tested)                                                                                                                              TREATMENT DATE: 05/21/24  Stand pivot transfer to arm chair with RW and supervision assist.  Stepping up into parallel bars with mild antalgia with leading with the RLE.   Gait in parallel bars x 3 laps with supervision assist Side stepping R and L x 2 laps with Heavy UE support  Forward/reverse in parallel bars x 3 laps   Seated HS stretch 2 min  x 1 LLE.   Foot tap on 4inch step to improve full WB and increase   Gait with RW x 30ft x 2 bouts. + 63ft with cues for posture awareness of trendelenburg gait patterns.   Sit<>stand without UE support 2 x 6 Sit<>stand with BUE support on L thigh to increase weight shift over the LLE.   PT provided close supervision assist from safety, but no assist required by PT.  PATIENT EDUCATION: Education details: Pt educated on role of PT and services provided during current POC, along with prognosis and information about the clinic. Person educated: Patient and Spouse Education method: Explanation Education comprehension: verbalized understanding  HOME EXERCISE PROGRAM: Access Code: [email or web address] URL: [email or web address] Date: 05/14/2024 Prepared by: Peggye Linear  Exercises - Forward and backward step Right leg   - 1 x daily - 7 x weekly - 3 sets - 15 reps - Standing Hip Abduction  with Counter Support  - 1 x daily - 7 x weekly - 2 sets - 10 reps - Mini Squat with Counter Support  - 1 x daily - 7 x weekly - 2 sets - 10 reps - Seated Hamstring Stretch  - 2 x daily - 1 sets - 2 reps - 45sec hold - Seated Knee Extension Stretch with Chair  - 2 x daily - 1 sets - 2 reps - 45sec hold - Supine Knee Extension Stretch on Towel Roll  - 2 x daily - 1 sets - 2 reps - 90 hold  GOALS: Goals reviewed with patient? Yes  SHORT TERM GOALS: Target date: 05/30/2024  Pt will be independent with HEP in order to demonstrate increased ability to perform tasks related to occupation/hobbies. Baseline: Goal status: INITIAL  LONG TERM GOALS: Target date: 07/25/2024  1.  Patient (> 29 years old) will complete five times sit to stand test in < 15 seconds indicating an increased LE strength and improved balance. Baseline: 11.36 sec Goal status: INITIAL   2.  Patient will reduce timed up and go to <11 seconds to reduce fall risk and  demonstrate improved transfer/gait ability. Baseline: 19.53 sec Goal status: INITIAL  3.  Patient will increase 10 meter walk test to >1.41m/s as to improve gait speed for better community ambulation and to reduce fall risk. Baseline: TBD Goal status: INITIAL  4.  Patient will increase six minute walk test distance to >1000 for progression to community ambulator and improve gait ability. Baseline: 112 feet in 2 min and 8 sec  Goal status: INITIAL  5.  Pt will improve their Locomotor Capabilities Index (LCI-5) score by at least 7 points (MCID), demonstrating a clinically meaningful increase in ability to perform basic locomotor tasks with assistance and/or use of aids. Baseline: TBD Goal Status: INITIAL  ASSESSMENT:  CLINICAL IMPRESSION: Continued to advance progressive loading in stance and advancing weight shifting. Significantly reduced pain in the residual limb on this day, allows increased time in standing and increased gait with prosthetic. Mild  trendelenburg gait pattern wit fatigue, but no LOB. Pt will benefit from skilled therapy to assist pt with transfer training, mobility, gait, and strength training to improve overall tolerance to ambulation and improve current QoL and return as close to prior level of function.    OBJECTIVE IMPAIRMENTS: Abnormal gait, decreased activity tolerance, decreased balance, decreased endurance, decreased knowledge of use of DME, decreased mobility, difficulty walking, decreased ROM, decreased strength, prosthetic dependency , and pain.   ACTIVITY LIMITATIONS: carrying, lifting, bending, standing, squatting, stairs, transfers, bathing, toileting, dressing, hygiene/grooming, and caring for others  PARTICIPATION LIMITATIONS: meal prep, cleaning, laundry, driving, shopping, community activity, occupation, and yard work  PERSONAL FACTORS: Age, Behavior pattern, Fitness, Past/current experiences, Profession, Time since onset of injury/illness/exacerbation, and 3+ comorbidities: anemia, breast cancer, HTN, PE's are also affecting patient's functional outcome.   REHAB POTENTIAL: Good  CLINICAL DECISION MAKING: Evolving/moderate complexity  EVALUATION COMPLEXITY: Moderate  PLAN:  PT FREQUENCY: 2x/week  PT DURATION: 12 weeks  PLANNED INTERVENTIONS: 97750- Physical Performance Testing, 97110-Therapeutic exercises, 97530- Therapeutic activity, [id number]- Neuromuscular re-education, 97535- Self Care, 02859- Manual therapy, [phone]- Gait training, [phone]- Orthotic/Prosthetic subsequent, Patient/Family education, Balance training, Stair training, Joint mobilization, and DME instructions  PLAN FOR NEXT SESSION:   Standing tolerance Increased gait with LRAD as tolerated.   Massie Dollar PT, DPT  Physical Therapist - [place]  [facility]  3:46 PM 05/21/24

## 2024-05-22 ENCOUNTER — Ambulatory Visit (INDEPENDENT_AMBULATORY_CARE_PROVIDER_SITE_OTHER): Admitting: Nurse Practitioner

## 2024-05-22 ENCOUNTER — Encounter (INDEPENDENT_AMBULATORY_CARE_PROVIDER_SITE_OTHER): Payer: Self-pay | Admitting: Nurse Practitioner

## 2024-05-22 VITALS — BP 131/81 | HR 97 | Resp 18 | Wt 181.0 lb

## 2024-05-22 DIAGNOSIS — I70211 Atherosclerosis of native arteries of extremities with intermittent claudication, right leg: Secondary | ICD-10-CM | POA: Diagnosis not present

## 2024-05-22 DIAGNOSIS — K259 Gastric ulcer, unspecified as acute or chronic, without hemorrhage or perforation: Secondary | ICD-10-CM

## 2024-05-22 DIAGNOSIS — Z89512 Acquired absence of left leg below knee: Secondary | ICD-10-CM

## 2024-05-22 MED ORDER — TIZANIDINE HCL 4 MG PO TABS: 4.0000 mg | ORAL_TABLET | Freq: Four times a day (QID) | ORAL | 3 refills | Status: DC | PRN

## 2024-05-22 NOTE — Progress Notes (Signed)
 Subjective:    Patient ID: Penny Chase, female    DOB: 05-31-1971, 53 y.o.   MRN: [id number] Chief Complaint  Patient presents with   Follow-up    6 weeks abi     The patient presents today for follow-up evaluation after left below-knee amputation on 11/26/2023.  She returns today for noninvasive studies.  Currently she denies any open wounds or ulcerations.  She has been recently working with physical therapy with her prosthetic.  It is going well.  The patient recently underwent noninvasive studies which show an ABI of 1.13 on the right.  Her toe waveforms are also improved within normal TBI compared to previous.  She has good triphasic waveforms and good toe waveforms.      Review of Systems  Neurological:  Positive for weakness.  All other systems reviewed and are negative.      Objective:   Physical Exam Vitals reviewed.  HENT:     Head: Normocephalic.  Cardiovascular:     Rate and Rhythm: Normal rate.  Pulmonary:     Effort: Pulmonary effort is normal.  Skin:    General: Skin is warm.  Neurological:     Mental Status: She is alert and oriented to person, place, and time.  Psychiatric:        Mood and Affect: Mood normal.        Behavior: Behavior normal.        Thought Content: Thought content normal.        Judgment: Judgment normal.     BP 131/81   Pulse 97   Resp 18   Wt 181 lb (82.1 kg)   LMP 09/20/2018 (Exact Date)   BMI 31.07 kg/m   Past Medical History:  Diagnosis Date   Acute lower limb ischemia    Anemia    Breast cancer (HCC)    Cancer (HCC) 09/2018   Right breast   COVID-19 11/2018   Family history of breast cancer    GERD (gastroesophageal reflux disease)    Hypertension    Personal history of radiation therapy 2020   Rt breast   Pulmonary embolism (HCC)     Social History   Socioeconomic History   Marital status: Married    Spouse name: Not on file   Number of children: Not on file   Years of education: Not on file    Highest education level: Not on file  Occupational History   Not on file  Tobacco Use   Smoking status: Every Day    Current packs/day: 1.00    Average packs/day: 1 pack/day for 15.0 years (15.0 ttl pk-yrs)    Types: Cigarettes   Smokeless tobacco: Never   Tobacco comments:    patient declined  Vaping Use   Vaping status: Former  Substance and Sexual Activity   Alcohol use: Not Currently    Comment: not taken for about 6 weeks   Drug use: Not Currently    Types: Marijuana    Comment: Uses street opioids   Sexual activity: Not Currently  Other Topics Concern   Not on file  Social History Narrative   Works at [employer];  Smoke 15 cig/day; No alcohol abuse; in [employer]; with husband.    Social Drivers of Corporate investment banker Strain: Low Risk  (03/27/2024)   Received from [facility]   Overall Financial Resource Strain (CARDIA)    Difficulty of Paying Living Expenses: Not hard at all  Food Insecurity: No Food Insecurity (03/27/2024)   Received from [facility]   Hunger Vital Sign    Within the past 12 months, you worried that your food would run out before you got the money to buy more.: Never true    Within the past 12 months, the food you bought just didn't last and you didn't have money to get more.: Never true  Transportation Needs: No Transportation Needs (03/27/2024)   Received from [facility] - Transportation    In the past 12 months, has lack of transportation kept you from medical appointments or from getting medications?: No    Lack of Transportation (Non-Medical): No  Physical Activity: Not on file  Stress: Not on file  Social Connections: Not on file  Intimate Partner Violence: Not At Risk (02/14/2024)   Humiliation, Afraid, Rape, and Kick questionnaire    Fear of Current or Ex-Partner: No    Emotionally Abused: No    Physically Abused: No    Sexually Abused: No    Past  Surgical History:  Procedure Laterality Date   AMPUTATION Left 11/26/2023   Procedure: AMPUTATION BELOW KNEE;  Surgeon: Jama Cordella MATSU, MD;  Location: [facility] ORS;  Service: Vascular;  Laterality: Left;   BREAST BIOPSY Right 09/12/2018   us  core/venus clip-INVASIVE MAMMARYLobular CARCINOMA, WITH LOBULAR FEATURES   BREAST LUMPECTOMY Right 10/04/2018   BREAST LUMPECTOMY WITH NEEDLE LOCALIZATION AND AXILLARY SENTINEL LYMPH NODE BX Right 10/04/2018   Procedure: BREAST LUMPECTOMY WITH NEEDLE LOCALIZATION AND AXILLARY SENTINEL LYMPH NODE BX;  Surgeon: Rodolph Romano, MD;  Location: [facility] ORS;  Service: General;  Laterality: Right;   COLONOSCOPY N/A 11/28/2023   Procedure: COLONOSCOPY;  Surgeon: Jinny Carmine, MD;  Location: [facility] ENDOSCOPY;  Service: Endoscopy;  Laterality: N/A;   ESOPHAGOGASTRODUODENOSCOPY N/A 11/28/2023   Procedure: EGD (ESOPHAGOGASTRODUODENOSCOPY);  Surgeon: Jinny Carmine, MD;  Location[facility] ENDOSCOPY;  Service: Endoscopy;  Laterality: N/A;   ESOPHAGOGASTRODUODENOSCOPY N/A 04/02/2024   Procedure: EGD (ESOPHAGOGASTRODUODENOSCOPY);  Surgeon: Therisa Bi, MD;  Location: [facility] ENDOSCOPY;  Service: Gastroenterology;  Laterality: N/A;   GIVENS CAPSULE STUDY N/A 11/28/2023   Procedure: IMAGING PROCEDURE, GI TRACT, INTRALUMINAL, VIA CAPSULE;  Surgeon: Jinny Carmine, MD;  Location: [facility] ENDOSCOPY;  Service: Endoscopy;  Laterality: N/A;   IVC FILTER INSERTION N/A 11/28/2023   Procedure: IVC FILTER INSERTION;  Surgeon: Jama Cordella MATSU, MD;  Location: [facility] INVASIVE CV LAB;  Service: Cardiovascular;  Laterality: N/A;   LOWER EXTREMITY ANGIOGRAPHY Left 11/24/2023   Procedure: Lower Extremity Angiography;  Surgeon: Jama Cordella MATSU, MD;  Location: [facility] INVASIVE CV LAB;  Service: Cardiovascular;  Laterality: Left;   TONSILLECTOMY      Family History  Problem Relation Age of Onset   Diabetes Mother    Atrial fibrillation Father    Breast cancer Paternal Aunt     Allergies  Allergen  Reactions   Amlodipine  Swelling       Latest Ref Rng & Units 05/15/2024    2:14 PM 02/14/2024    1:28 PM 01/17/2024    1:12 PM  CBC  WBC 4.0 - 10.5 K/uL 13.9  10.8  11.8   Hemoglobin 12.0 - 15.0 g/dL 87.5  85.9  87.4   Hematocrit 36.0 - 46.0 % 38.7  44.3  41.3   Platelets 150 - 400 K/uL 432  417  442       CMP     Component Value Date/Time   NA 135 05/15/2024 1414   K 4.2  05/15/2024 1414   CL 101 05/15/2024 1414   CO2 25 05/15/2024 1414   GLUCOSE 103 (H) 05/15/2024 1414   BUN 21 (H) 05/15/2024 1414   CREATININE 1.14 (H) 05/15/2024 1414   CALCIUM  9.3 05/15/2024 1414   PROT 6.6 11/24/2023 0254   ALBUMIN 2.5 (L) 11/24/2023 0254   AST 28 11/24/2023 0254   ALT 27 11/24/2023 0254   ALKPHOS 97 11/24/2023 0254   BILITOT 0.5 11/24/2023 0254   GFRNONAA 57 (L) 05/15/2024 1414     VAS US  ABI WITH/WO TBI Result Date: 03/26/2024  LOWER EXTREMITY DOPPLER STUDY Patient Name:  AAVA DELAND  Date of Exam:   03/23/2024 Medical Rec #: [id number]       Accession #:    [id number] Date of Birth: [birth year]       Patient Gender: F Patient Age:   65 years Exam Location:  [facility] Procedure:      VAS US  ABI WITH/WO TBI Referring Phys: --------------------------------------------------------------------------------  Indications: Peripheral artery disease. left BKA  Performing Technologist: Elsie Churn RT, RDMS, RVT  Examination Guidelines: A complete evaluation includes at minimum, Doppler waveform signals and systolic blood pressure reading at the level of bilateral brachial, anterior tibial, and posterior tibial arteries, when vessel segments are accessible. Bilateral testing is considered an integral part of a complete examination. Photoelectric Plethysmograph (PPG) waveforms and toe systolic pressure readings are included as required and additional duplex testing as needed. Limited examinations for reoccurring indications may be performed as noted.  ABI Findings:  +---------+------------------+-----+--------+-------+ Right    Rt Pressure (mmHg)IndexWaveformComment +---------+------------------+-----+--------+-------+ Brachial 118                                    +---------+------------------+-----+--------+-------+ PTA      125               1.06 biphasic        +---------+------------------+-----+--------+-------+ DP       124               1.05 biphasic        +---------+------------------+-----+--------+-------+ Great Toe25                0.21 Abnormal        +---------+------------------+-----+--------+-------+ +--------+------------------+-----+--------+-------+ Left    Lt Pressure (mmHg)IndexWaveformComment +--------+------------------+-----+--------+-------+ [id number]                                    +--------+------------------+-----+--------+-------+ TOES Findings: +----------+---------------+--------+-------+ Right ToesPressure (mmHg)WaveformComment +----------+---------------+--------+-------+ 1st Digit                Abnormal        +----------+---------------+--------+-------+ 2nd Digit                Abnormal        +----------+---------------+--------+-------+ 3rd Digit                Abnormal        +----------+---------------+--------+-------+ 4th Digit                Abnormal        +----------+---------------+--------+-------+ 5th Digit                Abnormal        +----------+---------------+--------+-------+  Summary: Right: Resting right ankle-brachial index is within normal range. The right toe-brachial index is  abnormal. PPG tracings appear dampened. *See table(s) above for measurements and observations.  Electronically signed by Cordella Shawl MD on 03/26/2024 at 7:36:36 AM.    Final        Assessment & Plan:   1. Hx of left BKA (HCC) (Primary) Currently working with physical therapy and doing well with training.  She is still having some muscle spasms.  Per the  patient's request we have sent in Zanaflex.  2. Atherosclerosis of native artery of right lower extremity with intermittent claudication (HCC) Today the patient's noninvasive studies are improved from previous studies.  She continues not to have any open wounds or ulcerations, claudication or evidence of rest pain.  Based on this we will move her follow-up to 3 months but she is advised to follow-up sooner if issues arise.  She also was able to restart Eliquis as her colonoscopy went well.  3. Multiple gastric ulcers The patient recently underwent a colonoscopy and they deemed that it was safe for her to restart her Eliquis.  She has been doing well with this.   Current Outpatient Medications on File Prior to Visit  Medication Sig Dispense Refill   acetaminophen  (TYLENOL ) 500 MG tablet Take 500 mg by mouth every 6 (six) hours as needed for moderate pain (pain score 4-6).     apixaban (ELIQUIS) 5 MG TABS tablet Take 5 mg by mouth 2 (two) times daily.     atorvastatin (LIPITOR) 20 MG tablet Take 20 mg by mouth at bedtime.     celecoxib (CELEBREX) 100 MG capsule Take 100 mg by mouth 2 (two) times daily.     cyanocobalamin  1000 MCG tablet Take 1 tablet (1,000 mcg total) by mouth daily. 30 tablet 2   folic acid  (FOLVITE ) 1 MG tablet Take 1 tablet (1 mg total) by mouth daily. 30 tablet 0   gabapentin  (NEURONTIN ) 600 MG tablet Take 1 tablet (600 mg total) by mouth 3 (three) times daily. 90 tablet 0   iron  polysaccharides (NIFEREX) 150 MG capsule Take 1 capsule (150 mg total) by mouth daily. 30 capsule 0   lisinopril (ZESTRIL) 20 MG tablet Take 20 mg by mouth daily.     oxyCODONE  (OXY IR/ROXICODONE ) 5 MG immediate release tablet Take 1 tablet (5 mg total) by mouth every 6 (six) hours as needed for severe pain (pain score 7-10). 46 tablet 0   pantoprazole  (PROTONIX ) 40 MG tablet Take 1 tablet (40 mg total) by mouth 2 (two) times daily before a meal. (Patient taking differently: Take 40 mg by mouth  daily.) 60 tablet 1   venlafaxine  XR (EFFEXOR -XR) 37.5 MG 24 hr capsule TAKE 1 CAPSULE BY MOUTH DAILY WITH BREAKFAST. 90 capsule 2   No current facility-administered medications on file prior to visit.    There are no Patient Instructions on file for this visit. No follow-ups on file.   Allissa Albright E Ofilia Rayon, NP

## 2024-05-23 ENCOUNTER — Ambulatory Visit

## 2024-05-23 DIAGNOSIS — R262 Difficulty in walking, not elsewhere classified: Secondary | ICD-10-CM

## 2024-05-23 DIAGNOSIS — M6281 Muscle weakness (generalized): Secondary | ICD-10-CM

## 2024-05-23 NOTE — Therapy (Signed)
 OUTPATIENT PHYSICAL THERAPY TREATMENT  Patient Name: Penny Chase MRN: [id number] DOB:June 22, 1971, 53 y.o., female Today's Date: 05/23/2024  PCP: [facility] REFERRING PROVIDER: Delores Orvin BRAVO, NP  END OF SESSION:  PT End of Session - 05/23/24 1629     Visit Number 6    Number of Visits 25    Date for Recertification  07/25/24    Authorization Type [employer]    Progress Note Due on Visit 10    PT Start Time 1618    PT Stop Time 1658    PT Time Calculation (min) 40 min    Equipment Utilized During Treatment Gait belt    Activity Tolerance Patient tolerated treatment well;No increased pain    Behavior During Therapy [facility] for tasks assessed/performed           Past Medical History:  Diagnosis Date   Acute lower limb ischemia    Anemia    Breast cancer (HCC)    Cancer (HCC) 09/2018   Right breast   COVID-19 11/2018   Family history of breast cancer    GERD (gastroesophageal reflux disease)    Hypertension    Personal history of radiation therapy 2020   Rt breast   Pulmonary embolism [facility])    Past Surgical History:  Procedure Laterality Date   AMPUTATION Left 11/26/2023   Procedure: AMPUTATION BELOW KNEE;  Surgeon: Jama Cordella MATSU, MD;  Location: [facility] ORS;  Service: Vascular;  Laterality: Left;   BREAST BIOPSY Right 09/12/2018   us  core/venus clip-INVASIVE MAMMARYLobular CARCINOMA, WITH LOBULAR FEATURES   BREAST LUMPECTOMY Right 10/04/2018   BREAST LUMPECTOMY WITH NEEDLE LOCALIZATION AND AXILLARY SENTINEL LYMPH NODE BX Right 10/04/2018   Procedure: BREAST LUMPECTOMY WITH NEEDLE LOCALIZATION AND AXILLARY SENTINEL LYMPH NODE BX;  Surgeon: Rodolph Romano, MD;  Location: [facility] ORS;  Service: General;  Laterality: Right;   COLONOSCOPY N/A 11/28/2023   Procedure: COLONOSCOPY;  Surgeon: Jinny Carmine, MD;  Location: [facility] ENDOSCOPY;  Service: Endoscopy;  Laterality: N/A;   ESOPHAGOGASTRODUODENOSCOPY N/A 11/28/2023   Procedure: EGD  (ESOPHAGOGASTRODUODENOSCOPY);  Surgeon: Jinny Carmine, MD;  Location: [facility] ENDOSCOPY;  Service: Endoscopy;  Laterality: N/A;   ESOPHAGOGASTRODUODENOSCOPY N/A 04/02/2024   Procedure: EGD (ESOPHAGOGASTRODUODENOSCOPY);  Surgeon: Therisa Bi, MD;  Location: [facility] ENDOSCOPY;  Service: Gastroenterology;  Laterality: N/A;   GIVENS CAPSULE STUDY N/A 11/28/2023   Procedure: IMAGING PROCEDURE, GI TRACT, INTRALUMINAL, VIA CAPSULE;  Surgeon: Jinny Carmine, MD;  Location: [facility] ENDOSCOPY;  Service: Endoscopy;  Laterality: N/A;   IVC FILTER INSERTION N/A 11/28/2023   Procedure: IVC FILTER INSERTION;  Surgeon: Jama Cordella MATSU, MD;  Location: [facility] INVASIVE CV LAB;  Service: Cardiovascular;  Laterality: N/A;   LOWER EXTREMITY ANGIOGRAPHY Left 11/24/2023   Procedure: Lower Extremity Angiography;  Surgeon: Jama Cordella MATSU, MD;  Location: [facility] INVASIVE CV LAB;  Service: Cardiovascular;  Laterality: Left;   TONSILLECTOMY     Patient Active Problem List   Diagnosis Date Noted   Lumbar spondylosis 03/08/2024   Complex regional pain syndrome type 2 of left lower extremity 03/08/2024   Atherosclerosis of native arteries of extremity with intermittent claudication 03/02/2024   Phantom limb syndrome with pain (HCC) 03/01/2024   Diverticulitis of colon 03/01/2024   Pain 02/13/2024   Hx of left BKA (HCC) 01/25/2024   Symptomatic anemia 12/01/2023   Multiple gastric ulcers 11/28/2023   Ischemic colitis 11/28/2023   Bloody stools 11/26/2023   Rectal bleeding 11/26/2023   Acute blood loss anemia 11/26/2023   Blister of left foot 11/24/2023  Limb ischemia 11/23/2023   Gangrene of left foot (HCC) 11/23/2023   Tobacco abuse 11/23/2023   PE (pulmonary thromboembolism) (HCC) 11/23/2023   GI bleeding 11/23/2023   Substance abuse (HCC) 11/23/2023   Leukocytosis 11/23/2023   Bipolar affective disorder, current episode manic (HCC) 01/06/2021   Brief psychotic disorder (HCC) 01/03/2021   Family history of breast cancer     Carcinoma of upper-outer quadrant of right breast in female, estrogen receptor positive (HCC) 09/20/2018   Gastroesophageal reflux disease without esophagitis 01/04/2017   HTN (hypertension), benign 01/20/2014    ONSET DATE: 04/11/24  REFERRING DIAG: S10.487 (ICD-10-CM) - Hx of left BKA (HCC)   THERAPY DIAG:  Difficulty in walking, not elsewhere classified  Muscle weakness (generalized)  Rationale for Evaluation and Treatment: Rehabilitation  SUBJECTIVE:                                                                                                                                                                                             SUBJECTIVE STATEMENT: Pt reports pain improving steadily still. Pt taking gabapentin  prior to arrival. PT reports strong desire to AMB with SPC today.   PERTINENT HISTORY: Patient is a 53 year old female with acute limb ischemia s/p left BKA. PE in association with lower GI bleed s/p IVC filter.   PAIN:  Are you having pain? 4/10 residual limb pain, back a little more so   PRECAUTIONS: None  WEIGHT BEARING RESTRICTIONS: WBAT  FALLS: Has patient fallen in last 6 months? Yes. Number of falls 2  LIVING ENVIRONMENT: Lives with: lives with their spouse Lives in: House/apartment Stairs: Ramped entrance Has following equipment at home: Environmental consultant - 2 wheeled, Wheelchair (manual), and Architectural technologist  PLOF: Independent  PATIENT GOALS: Get out of the wheelchair and be able to get outside and do yard work that needs to be done.  OBJECTIVE:  Note: Objective measures were completed at Evaluation unless otherwise noted.  DIAGNOSTIC FINDINGS:   EXAM: MRI LUMBAR SPINE WITHOUT CONTRAST  IMPRESSION: Generally mild for age lumbar spine degeneration. No spinal stenosis or convincing neural impingement.   However, small left-side annular fissures of the discs at L4-L5 and L5-S1, which might be a source for Left L5 and/or S1 radiculitis (respectively).    LOWER EXTREMITY MMT:    MMT Right Eval Left Eval  Hip flexion 4 5  Hip extension    Hip abduction    Hip adduction    Hip internal rotation    Hip external rotation    Knee flexion 5 3+  Knee extension 5 3+  Ankle dorsiflexion    Ankle plantarflexion  Ankle inversion    Ankle eversion    (Blank rows = not tested)                                                                                                                              TREATMENT DATE: 05/23/24 -AMB 150ft -STS from plinth, hands free x10 (symmetrical, well balanced)   -AMB 150ft -airex pad stance x30sec, then chest press c 3kg ball x15 ten eyes closed x 60sec  -airex pad eyes closed x60 sec, then horizontal head turns x8, then vertical x15  -firm surface 4-square step pattern, hands free, no device x20 (steps)  -firm surface alternate 90 degree turns, hands free no device, minGuard A (has more limb pain as fatigue increases and motor control declines, cues to take smaller, smoother movements)   PATIENT EDUCATION: Education details: Pt educated on role of PT and services provided during current POC, along with prognosis and information about the clinic. Person educated: Patient and Spouse Education method: Explanation Education comprehension: verbalized understanding  HOME EXERCISE PROGRAM: Access Code: [email or web address] URL: [email or web address] Date: 05/14/2024 Prepared by: Peggye Linear  Exercises - Forward and backward step Right leg   - 1 x daily - 7 x weekly - 3 sets - 15 reps - Standing Hip Abduction with Counter Support  - 1 x daily - 7 x weekly - 2 sets - 10 reps - Mini Squat with Counter Support  - 1 x daily - 7 x weekly - 2 sets - 10 reps - Seated Hamstring Stretch  - 2 x daily - 1 sets - 2 reps - 45sec hold - Seated Knee Extension Stretch with Chair  - 2 x daily - 1 sets - 2 reps - 45sec hold - Supine Knee Extension Stretch on Towel Roll  - 2 x daily - 1 sets - 2 reps - 90  hold  GOALS: Goals reviewed with patient? Yes  SHORT TERM GOALS: Target date: 05/30/2024  Pt will be independent with HEP in order to demonstrate increased ability to perform tasks related to occupation/hobbies. Baseline: Goal status: INITIAL  LONG TERM GOALS: Target date: 07/25/2024  1.  Patient (> 50 years old) will complete five times sit to stand test in < 15 seconds indicating an increased LE strength and improved balance. Baseline: 11.36 sec Goal status: INITIAL   2.  Patient will reduce timed up and go to <11 seconds to reduce fall risk and demonstrate improved transfer/gait ability. Baseline: 19.53 sec Goal status: INITIAL  3.  Patient will increase 10 meter walk test to >1.12m/s as to improve gait speed for better community ambulation and to reduce fall risk. Baseline: TBD Goal status: INITIAL  4.  Patient will increase six minute walk test distance to >1000 for progression to community ambulator and improve gait ability. Baseline: 112 feet in 2 min and 8 sec  Goal status: INITIAL  5.  Pt will improve their Locomotor Capabilities Index (LCI-5) score by  at least 7 points (MCID), demonstrating a clinically meaningful increase in ability to perform basic locomotor tasks with assistance and/or use of aids. Baseline: TBD Goal Status: INITIAL  ASSESSMENT:  CLINICAL IMPRESSION: Pain in residual limb remains progressively better. Pt able to AMB 2 q71ft bouts c RW heavier emphasis on symmetrical, smooth controlled movements, avoidance of fatigue related limping and aberrant limb in socket loading. Pt very excited about idea of SPC today, however dino explains severe reasons that he is hesitant to do much with it, particularly as loading symmetry is so focal to today's visit, SPC would surely add more frontal plan movements of the trunk, which may also exacerbate back pain issues. Pt does very well wth balance training today, most challenged by airex eyes closed. Pt still having a  lot of eventual pressure near the anterior tibia.  Pt will benefit from skilled therapy to assist pt with transfer training, mobility, gait, and strength training to improve overall tolerance to ambulation and improve current QoL and return as close to prior level of function.    OBJECTIVE IMPAIRMENTS: Abnormal gait, decreased activity tolerance, decreased balance, decreased endurance, decreased knowledge of use of DME, decreased mobility, difficulty walking, decreased ROM, decreased strength, prosthetic dependency , and pain.   ACTIVITY LIMITATIONS: carrying, lifting, bending, standing, squatting, stairs, transfers, bathing, toileting, dressing, hygiene/grooming, and caring for others  PARTICIPATION LIMITATIONS: meal prep, cleaning, laundry, driving, shopping, community activity, occupation, and yard work  PERSONAL FACTORS: Age, Behavior pattern, Fitness, Past/current experiences, Profession, Time since onset of injury/illness/exacerbation, and 3+ comorbidities: anemia, breast cancer, HTN, PE's are also affecting patient's functional outcome.   REHAB POTENTIAL: Good  CLINICAL DECISION MAKING: Evolving/moderate complexity  EVALUATION COMPLEXITY: Moderate  PLAN:  PT FREQUENCY: 2x/week  PT DURATION: 12 weeks  PLANNED INTERVENTIONS: 97750- Physical Performance Testing, 97110-Therapeutic exercises, 97530- Therapeutic activity, 97112- Neuromuscular re-education, 97535- Self Care, [id number]- Manual therapy, [phone]- Gait training, [phone]- Orthotic/Prosthetic subsequent, Patient/Family education, Balance training, Stair training, Joint mobilization, and DME instructions  PLAN FOR NEXT SESSION:  Slowly advance limb loading time/tolerance, motor control in stance, progress AMB with RW with focus on symmetrical, limp-free AMB distance   5:36 PM, 05/23/24 Peggye JAYSON Linear, PT, DPT Physical Therapist - [facility]  Outpatient Physical Therapy- Main Campus [phone]

## 2024-05-28 ENCOUNTER — Ambulatory Visit

## 2024-05-28 DIAGNOSIS — R262 Difficulty in walking, not elsewhere classified: Secondary | ICD-10-CM

## 2024-05-28 DIAGNOSIS — M6281 Muscle weakness (generalized): Secondary | ICD-10-CM

## 2024-05-28 NOTE — Therapy (Signed)
 OUTPATIENT PHYSICAL THERAPY TREATMENT  Patient Name: Penny Chase MRN: [id number] DOB:08-31-1970, 53 y.o., female Today's Date: 05/28/2024  PCP: [facility] REFERRING PROVIDER: Delores Orvin BRAVO, NP  END OF SESSION:  PT End of Session - 05/28/24 1450     Visit Number 7    Number of Visits 25    Date for Recertification  07/25/24    Authorization Type Aetna    Progress Note Due on Visit 10    PT Start Time 1447    PT Stop Time 1527    PT Time Calculation (min) 40 min    Equipment Utilized During Treatment Gait belt    Activity Tolerance Patient tolerated treatment well;No increased pain    Behavior During Therapy [facility] for tasks assessed/performed           Past Medical History:  Diagnosis Date   Acute lower limb ischemia    Anemia    Breast cancer (HCC)    Cancer (HCC) 09/2018   Right breast   COVID-19 11/2018   Family history of breast cancer    GERD (gastroesophageal reflux disease)    Hypertension    Personal history of radiation therapy 2020   Rt breast   Pulmonary embolism [facility])    Past Surgical History:  Procedure Laterality Date   AMPUTATION Left 11/26/2023   Procedure: AMPUTATION BELOW KNEE;  Surgeon: Jama Cordella MATSU, MD;  Location: [facility] ORS;  Service: Vascular;  Laterality: Left;   BREAST BIOPSY Right 09/12/2018   us  core/venus clip-INVASIVE MAMMARYLobular CARCINOMA, WITH LOBULAR FEATURES   BREAST LUMPECTOMY Right 10/04/2018   BREAST LUMPECTOMY WITH NEEDLE LOCALIZATION AND AXILLARY SENTINEL LYMPH NODE BX Right 10/04/2018   Procedure: BREAST LUMPECTOMY WITH NEEDLE LOCALIZATION AND AXILLARY SENTINEL LYMPH NODE BX;  Surgeon: Rodolph Romano, MD;  Location: [facility] ORS;  Service: General;  Laterality: Right;   COLONOSCOPY N/A 11/28/2023   Procedure: COLONOSCOPY;  Surgeon: Jinny Carmine, MD;  Location: [facility] ENDOSCOPY;  Service: Endoscopy;  Laterality: N/A;   ESOPHAGOGASTRODUODENOSCOPY N/A 11/28/2023   Procedure: EGD  (ESOPHAGOGASTRODUODENOSCOPY);  Surgeon: Jinny Carmine, MD;  Location: [facility] ENDOSCOPY;  Service: Endoscopy;  Laterality: N/A;   ESOPHAGOGASTRODUODENOSCOPY N/A 04/02/2024   Procedure: EGD (ESOPHAGOGASTRODUODENOSCOPY);  Surgeon: Therisa Bi, MD;  Location: [facility] ENDOSCOPY;  Service: Gastroenterology;  Laterality: N/A;   GIVENS CAPSULE STUDY N/A 11/28/2023   Procedure: IMAGING PROCEDURE, GI TRACT, INTRALUMINAL, VIA CAPSULE;  Surgeon: Jinny Carmine, MD;  Location: [facility] ENDOSCOPY;  Service: Endoscopy;  Laterality: N/A;   IVC FILTER INSERTION N/A 11/28/2023   Procedure: IVC FILTER INSERTION;  Surgeon: Jama Cordella MATSU, MD;  Location: [facility] INVASIVE CV LAB;  Service: Cardiovascular;  Laterality: N/A;   LOWER EXTREMITY ANGIOGRAPHY Left 11/24/2023   Procedure: Lower Extremity Angiography;  Surgeon: Jama Cordella MATSU, MD;  Location: [facility] INVASIVE CV LAB;  Service: Cardiovascular;  Laterality: Left;   TONSILLECTOMY     Patient Active Problem List   Diagnosis Date Noted   Lumbar spondylosis 03/08/2024   Complex regional pain syndrome type 2 of left lower extremity 03/08/2024   Atherosclerosis of native arteries of extremity with intermittent claudication 03/02/2024   Phantom limb syndrome with pain (HCC) 03/01/2024   Diverticulitis of colon 03/01/2024   Pain 02/13/2024   Hx of left BKA (HCC) 01/25/2024   Symptomatic anemia 12/01/2023   Multiple gastric ulcers 11/28/2023   Ischemic colitis 11/28/2023   Bloody stools 11/26/2023   Rectal bleeding 11/26/2023   Acute blood loss anemia 11/26/2023   Blister of left foot 11/24/2023  Limb ischemia 11/23/2023   Gangrene of left foot (HCC) 11/23/2023   Tobacco abuse 11/23/2023   PE (pulmonary thromboembolism) (HCC) 11/23/2023   GI bleeding 11/23/2023   Substance abuse (HCC) 11/23/2023   Leukocytosis 11/23/2023   Bipolar affective disorder, current episode manic (HCC) 01/06/2021   Brief psychotic disorder (HCC) 01/03/2021   Family history of breast cancer     Carcinoma of upper-outer quadrant of right breast in female, estrogen receptor positive (HCC) 09/20/2018   Gastroesophageal reflux disease without esophagitis 01/04/2017   HTN (hypertension), benign 01/20/2014    ONSET DATE: 04/11/24  REFERRING DIAG: S10.487 (ICD-10-CM) - Hx of left BKA (HCC)   THERAPY DIAG:  Difficulty in walking, not elsewhere classified  Muscle weakness (generalized)  Rationale for Evaluation and Treatment: Rehabilitation  SUBJECTIVE:                                                                                                                                                                                             SUBJECTIVE STATEMENT: Pt reports pain improving steadily still. Pt taking gabapentin  prior to arrival. PT reports strong desire to AMB with SPC today.   PERTINENT HISTORY: Patient is a 53 year old female with acute limb ischemia s/p left BKA. PE in association with lower GI bleed s/p IVC filter.  PAIN:  Are you having pain? 4/10 residual limb pain in weight bearing, better when resting.    PRECAUTIONS: None WEIGHT BEARING RESTRICTIONS: WBAT FALLS: Has patient fallen in last 6 months? Yes. Number of falls 2  LIVING ENVIRONMENT: Lives with: lives with their spouse Lives in: House/apartment Stairs: Ramped entrance Has following equipment at home: Environmental consultant - 2 wheeled, Wheelchair (manual), and Architectural technologist  PLOF: Independent  PATIENT GOALS: Get out of the wheelchair and be able to get outside and do yard work that needs to be done.  OBJECTIVE:  Note: Objective measures were completed at Evaluation unless otherwise noted.  DIAGNOSTIC FINDINGS:   EXAM: MRI LUMBAR SPINE WITHOUT CONTRAST  IMPRESSION: Generally mild for age lumbar spine degeneration. No spinal stenosis or convincing neural impingement.   However, small left-side annular fissures of the discs at L4-L5 and L5-S1, which might be a source for Left L5 and/or S1  radiculitis (respectively).   LOWER EXTREMITY MMT:    MMT Right Eval Left Eval  Hip flexion 4 5  Hip extension    Hip abduction    Hip adduction    Hip internal rotation    Hip external rotation    Knee flexion 5 3+  Knee extension 5 3+  Ankle dorsiflexion    Ankle plantarflexion  Ankle inversion    Ankle eversion    (Blank rows = not tested)                                                                                                                              TREATMENT DATE: 05/28/24 -Left hamstrings stretch 1x 2 minutes, 1x60secx  -overground AMB 129ft c RW 53m24s -overground AMB 148ft c RW 82m15s -Left  LAQ 1x15 x3secH (2lb AW)  -seated LLA hip/knee flexion x15 -Left LAQ YTB x10, the x15 GTB -inspection of anterior tibia (sharp ridge noted from anterior tip distal and medial)  -tandem stance on balance beam x15sec 3xeach side  05/23/24 -AMB 167ft -STS from plinth, hands free x10 (symmetrical, well balanced)   -AMB 150ft -airex pad stance x30sec, then chest press c 3kg ball x15 ten eyes closed x 60sec  -airex pad eyes closed x60 sec, then horizontal head turns x8, then vertical x15  -firm surface 4-square step pattern, hands free, no device x20 (steps)  -firm surface alternate 90 degree turns, hands free no device, minGuard A (has more limb pain as fatigue increases and motor control declines, cues to take smaller, smoother movements)   PATIENT EDUCATION: Education details: Pt educated on role of PT and services provided during current POC, along with prognosis and information about the clinic. Person educated: Patient and Spouse Education method: Explanation Education comprehension: verbalized understanding  HOME EXERCISE PROGRAM: Access Code: [email or web address] URL: [email or web address] Date: 05/14/2024 Prepared by: Peggye Linear  Exercises - Forward and backward step Right leg   - 1 x daily - 7 x weekly - 3 sets - 15 reps - Standing Hip Abduction  with Counter Support  - 1 x daily - 7 x weekly - 2 sets - 10 reps - Mini Squat with Counter Support  - 1 x daily - 7 x weekly - 2 sets - 10 reps - Seated Hamstring Stretch  - 2 x daily - 1 sets - 2 reps - 45sec hold - Seated Knee Extension Stretch with Chair  - 2 x daily - 1 sets - 2 reps - 45sec hold - Supine Knee Extension Stretch on Towel Roll  - 2 x daily - 1 sets - 2 reps - 90 hold  GOALS: Goals reviewed with patient? Yes  SHORT TERM GOALS: Target date: 05/30/2024  Pt will be independent with HEP in order to demonstrate increased ability to perform tasks related to occupation/hobbies. Baseline: Goal status: INITIAL  LONG TERM GOALS: Target date: 07/25/2024  1.  Patient (> 21 years old) will complete five times sit to stand test in < 15 seconds indicating an increased LE strength and improved balance. Baseline: 11.36 sec Goal status: INITIAL   2.  Patient will reduce timed up and go to <11 seconds to reduce fall risk and demonstrate improved transfer/gait ability. Baseline: 19.53 sec Goal status: INITIAL  3.  Patient will increase 10 meter walk test to >1.40m/s as to improve  gait speed for better community ambulation and to reduce fall risk. Baseline: TBD Goal status: INITIAL  4.  Patient will increase six minute walk test distance to >1000 for progression to community ambulator and improve gait ability. Baseline: 112 feet in 2 min and 8 sec  Goal status: INITIAL  5.  Pt will improve their Locomotor Capabilities Index (LCI-5) score by at least 7 points (MCID), demonstrating a clinically meaningful increase in ability to perform basic locomotor tasks with assistance and/or use of aids. Baseline: TBD Goal Status: INITIAL  ASSESSMENT:  CLINICAL IMPRESSION: Pt continues to advance well in general with gait and loading interventions. Limb discomfort continues to fluctuate overall. Anterior tibia is quite sharp and exposed, anticipate significant modifications will need to be  made to foam insert or socket to maintain adequate loading pressures. Pt sees prosthetist next in about 3 weeks. Pt encouraged to call [facility] and try to reschedule for something earlier. Pt will benefit from skilled therapy to assist pt with transfer training, mobility, gait, and strength training to improve overall tolerance to ambulation and improve current QoL and return as close to prior level of function.    OBJECTIVE IMPAIRMENTS: Abnormal gait, decreased activity tolerance, decreased balance, decreased endurance, decreased knowledge of use of DME, decreased mobility, difficulty walking, decreased ROM, decreased strength, prosthetic dependency , and pain.   ACTIVITY LIMITATIONS: carrying, lifting, bending, standing, squatting, stairs, transfers, bathing, toileting, dressing, hygiene/grooming, and caring for others  PARTICIPATION LIMITATIONS: meal prep, cleaning, laundry, driving, shopping, community activity, occupation, and yard work  PERSONAL FACTORS: Age, Behavior pattern, Fitness, Past/current experiences, Profession, Time since onset of injury/illness/exacerbation, and 3+ comorbidities: anemia, breast cancer, HTN, PE's are also affecting patient's functional outcome.   REHAB POTENTIAL: Good  CLINICAL DECISION MAKING: Evolving/moderate complexity  EVALUATION COMPLEXITY: Moderate  PLAN:  PT FREQUENCY: 2x/week  PT DURATION: 12 weeks  PLANNED INTERVENTIONS: 97750- Physical Performance Testing, 97110-Therapeutic exercises, 97530- Therapeutic activity, 97112- Neuromuscular re-education, 97535- Self Care, 02859- Manual therapy, [phone]- Gait training, [phone]- Orthotic/Prosthetic subsequent, Patient/Family education, Balance training, Stair training, Joint mobilization, and DME instructions  PLAN FOR NEXT SESSION:  Slowly advance limb loading time/tolerance, motor control in stance, progress AMB with RW with focus on symmetrical, limp-free AMB distance   2:52 PM, 05/28/24 Peggye JAYSON Linear,  PT, DPT Physical Therapist - [facility]  Outpatient Physical Therapy- Main Campus [phone]

## 2024-05-30 ENCOUNTER — Ambulatory Visit

## 2024-05-30 DIAGNOSIS — R262 Difficulty in walking, not elsewhere classified: Secondary | ICD-10-CM

## 2024-05-30 DIAGNOSIS — M6281 Muscle weakness (generalized): Secondary | ICD-10-CM

## 2024-05-30 NOTE — Therapy (Signed)
 OUTPATIENT PHYSICAL THERAPY TREATMENT  Patient Name: Penny Chase MRN: [id number] DOB:1970/09/20, 53 y.o., female Today's Date: 05/30/2024  PCP: [facility] REFERRING PROVIDER: Delores Orvin BRAVO, NP  END OF SESSION:  PT End of Session - 05/30/24 1640     Visit Number 8    Number of Visits 25    Date for Recertification  07/25/24    Authorization Type Aetna    Authorization Time Period Aetna [facility]    Progress Note Due on Visit 10    PT Start Time 1625    PT Stop Time 1655    PT Time Calculation (min) 30 min    Equipment Utilized During Treatment Gait belt    Activity Tolerance Patient tolerated treatment well;No increased pain    Behavior During Therapy [facility] for tasks assessed/performed           Past Medical History:  Diagnosis Date   Acute lower limb ischemia    Anemia    Breast cancer (HCC)    Cancer (HCC) 09/2018   Right breast   COVID-19 11/2018   Family history of breast cancer    GERD (gastroesophageal reflux disease)    Hypertension    Personal history of radiation therapy 2020   Rt breast   Pulmonary embolism [facility])    Past Surgical History:  Procedure Laterality Date   AMPUTATION Left 11/26/2023   Procedure: AMPUTATION BELOW KNEE;  Surgeon: Jama Cordella MATSU, MD;  Location: [facility] ORS;  Service: Vascular;  Laterality: Left;   BREAST BIOPSY Right 09/12/2018   us  core/venus clip-INVASIVE MAMMARYLobular CARCINOMA, WITH LOBULAR FEATURES   BREAST LUMPECTOMY Right 10/04/2018   BREAST LUMPECTOMY WITH NEEDLE LOCALIZATION AND AXILLARY SENTINEL LYMPH NODE BX Right 10/04/2018   Procedure: BREAST LUMPECTOMY WITH NEEDLE LOCALIZATION AND AXILLARY SENTINEL LYMPH NODE BX;  Surgeon: Rodolph Romano, MD;  Location: [facility] ORS;  Service: General;  Laterality: Right;   COLONOSCOPY N/A 11/28/2023   Procedure: COLONOSCOPY;  Surgeon: Jinny Carmine, MD;  Location: [facility] ENDOSCOPY;  Service: Endoscopy;  Laterality: N/A;   ESOPHAGOGASTRODUODENOSCOPY N/A  11/28/2023   Procedure: EGD (ESOPHAGOGASTRODUODENOSCOPY);  Surgeon: Jinny Carmine, MD;  Location: [facility] ENDOSCOPY;  Service: Endoscopy;  Laterality: N/A;   ESOPHAGOGASTRODUODENOSCOPY N/A 04/02/2024   Procedure: EGD (ESOPHAGOGASTRODUODENOSCOPY);  Surgeon: Therisa Bi, MD;  Location: [facility] ENDOSCOPY;  Service: Gastroenterology;  Laterality: N/A;   GIVENS CAPSULE STUDY N/A 11/28/2023   Procedure: IMAGING PROCEDURE, GI TRACT, INTRALUMINAL, VIA CAPSULE;  Surgeon: Jinny Carmine, MD;  Location: [facility] ENDOSCOPY;  Service: Endoscopy;  Laterality: N/A;   IVC FILTER INSERTION N/A 11/28/2023   Procedure: IVC FILTER INSERTION;  Surgeon: Jama Cordella MATSU, MD;  Location: [facility] INVASIVE CV LAB;  Service: Cardiovascular;  Laterality: N/A;   LOWER EXTREMITY ANGIOGRAPHY Left 11/24/2023   Procedure: Lower Extremity Angiography;  Surgeon: Jama Cordella MATSU, MD;  Location: [facility] INVASIVE CV LAB;  Service: Cardiovascular;  Laterality: Left;   TONSILLECTOMY     Patient Active Problem List   Diagnosis Date Noted   Lumbar spondylosis 03/08/2024   Complex regional pain syndrome type 2 of left lower extremity 03/08/2024   Atherosclerosis of native arteries of extremity with intermittent claudication 03/02/2024   Phantom limb syndrome with pain (HCC) 03/01/2024   Diverticulitis of colon 03/01/2024   Pain 02/13/2024   Hx of left BKA (HCC) 01/25/2024   Symptomatic anemia 12/01/2023   Multiple gastric ulcers 11/28/2023   Ischemic colitis 11/28/2023   Bloody stools 11/26/2023   Rectal bleeding 11/26/2023   Acute blood loss anemia  11/26/2023   Blister of left foot 11/24/2023   Limb ischemia 11/23/2023   Gangrene of left foot (HCC) 11/23/2023   Tobacco abuse 11/23/2023   PE (pulmonary thromboembolism) (HCC) 11/23/2023   GI bleeding 11/23/2023   Substance abuse (HCC) 11/23/2023   Leukocytosis 11/23/2023   Bipolar affective disorder, current episode manic (HCC) 01/06/2021   Brief psychotic disorder (HCC) 01/03/2021   Family  history of breast cancer    Carcinoma of upper-outer quadrant of right breast in female, estrogen receptor positive (HCC) 09/20/2018   Gastroesophageal reflux disease without esophagitis 01/04/2017   HTN (hypertension), benign 01/20/2014    ONSET DATE: 04/11/24  REFERRING DIAG: S10.487 (ICD-10-CM) - Hx of left BKA (HCC)   THERAPY DIAG:  Difficulty in walking, not elsewhere classified  Muscle weakness (generalized)  Rationale for Evaluation and Treatment: Rehabilitation  SUBJECTIVE:                                                                                                                                                                                             SUBJECTIVE STATEMENT: Pt reports pain improving steadily still. Pt taking gabapentin  prior to arrival. PT reports strong desire to AMB with SPC today.   PERTINENT HISTORY: Patient is a 53 year old female with acute limb ischemia s/p left BKA. PE in association with lower GI bleed s/p IVC filter.  PAIN:  Are you having pain? 4/10 residual limb pain in weight bearing, better when resting.    PRECAUTIONS: None WEIGHT BEARING RESTRICTIONS: WBAT FALLS: Has patient fallen in last 6 months? Yes. Number of falls 2  LIVING ENVIRONMENT: Lives with: lives with their spouse Lives in: House/apartment Stairs: Ramped entrance Has following equipment at home: Environmental consultant - 2 wheeled, Wheelchair (manual), and Architectural technologist  PLOF: Independent  PATIENT GOALS: Get out of the wheelchair and be able to get outside and do yard work that needs to be done.  OBJECTIVE:  Note: Objective measures were completed at Evaluation unless otherwise noted.  DIAGNOSTIC FINDINGS:   EXAM: MRI LUMBAR SPINE WITHOUT CONTRAST  IMPRESSION: Generally mild for age lumbar spine degeneration. No spinal stenosis or convincing neural impingement.   However, small left-side annular fissures of the discs at L4-L5 and L5-S1, which might be a source for Left L5  and/or S1 radiculitis (respectively).   LOWER EXTREMITY MMT:    MMT Right Eval Left Eval  Hip flexion 4 5  Hip extension    Hip abduction    Hip adduction    Hip internal rotation    Hip external rotation    Knee flexion 5 3+  Knee extension 5 3+  Ankle dorsiflexion    Ankle plantarflexion    Ankle inversion    Ankle eversion    (Blank rows = not tested)                                                                                                                              TREATMENT DATE: 05/30/24 -AMB WC to Nustep (56ft) RW  -nustep level 1: seat 5, arms 7: SPM >70 -4 minutes (HR 124)  -3 minute recovery  -3 minutes (HR 128)  -3 minute recovery  -3 minutes  -3 minute recovery   -153ftAMB overground c RW (pt trying to move quickly) 59m8s  -medial whip noted today with some additional toe-in noted in mid stance, unclear if this has been an issue previously.      *uploaded pictures of exposed firm edge of anterior tibia.   05/28/24 -Left hamstrings stretch 1x 2 minutes, 1x60secx  -overground AMB 146ft c RW 5m24s -overground AMB 165ft c RW 101m15s -Left  LAQ 1x15 x3secH (2lb AW)  -seated LLA hip/knee flexion x15 -Left LAQ YTB x10, the x15 GTB -inspection of anterior tibia (sharp ridge noted from anterior tip distal and medial)  -tandem stance on balance beam x15sec 3xeach side  PATIENT EDUCATION: Education details: Pt educated on role of PT and services provided during current POC, along with prognosis and information about the clinic. Person educated: Patient and Spouse Education method: Explanation Education comprehension: verbalized understanding  HOME EXERCISE PROGRAM: Access Code: [email or web address] URL: [email or web address] Date: 05/14/2024 Prepared by: Peggye Linear  Exercises - Forward and backward step Right leg   - 1 x daily - 7 x weekly - 3 sets - 15 reps - Standing Hip Abduction with Counter Support  - 1 x daily - 7 x weekly - 2 sets -  10 reps - Mini Squat with Counter Support  - 1 x daily - 7 x weekly - 2 sets - 10 reps - Seated Hamstring Stretch  - 2 x daily - 1 sets - 2 reps - 45sec hold - Seated Knee Extension Stretch with Chair  - 2 x daily - 1 sets - 2 reps - 45sec hold - Supine Knee Extension Stretch on Towel Roll  - 2 x daily - 1 sets - 2 reps - 90 hold  GOALS: Goals reviewed with patient? Yes  SHORT TERM GOALS: Target date: 05/30/2024  Pt will be independent with HEP in order to demonstrate increased ability to perform tasks related to occupation/hobbies. Baseline: Goal status: INITIAL  LONG TERM GOALS: Target date: 07/25/2024  1.  Patient (> 8 years old) will complete five times sit to stand test in < 15 seconds indicating an increased LE strength and improved balance. Baseline: 11.36 sec Goal status: INITIAL   2.  Patient will reduce timed up and go to <11 seconds to reduce fall risk and demonstrate improved transfer/gait ability. Baseline: 19.53 sec Goal status: INITIAL  3.  Patient will increase  10 meter walk test to >1.40m/s as to improve gait speed for better community ambulation and to reduce fall risk. Baseline: TBD Goal status: INITIAL  4.  Patient will increase six minute walk test distance to >1000 for progression to community ambulator and improve gait ability. Baseline: 112 feet in 2 min and 8 sec  Goal status: INITIAL  5.  Pt will improve their Locomotor Capabilities Index (LCI-5) score by at least 7 points (MCID), demonstrating a clinically meaningful increase in ability to perform basic locomotor tasks with assistance and/or use of aids. Baseline: TBD Goal Status: INITIAL  ASSESSMENT:  CLINICAL IMPRESSION: Pt likely at a standstill, functional plateau for weight bearing activity until adjustments can be made by prosthetist to offload distal anterior tibia. Utilized Nustep today to ascertain current fitness limitations which as it stands are quite significant, on level 1 pt has  cardiorespiratory limitation at around 3-4 minutes. Cardiorespiratory training will certainly help in future once pressure tolerance it optimized, will plan to focus on this and balance while appointment with [facility] awaits. Pt will benefit from skilled therapy to assist pt with transfer training, mobility, gait, and strength training to improve overall tolerance to ambulation and improve current QoL and return as close to prior level of function.    OBJECTIVE IMPAIRMENTS: Abnormal gait, decreased activity tolerance, decreased balance, decreased endurance, decreased knowledge of use of DME, decreased mobility, difficulty walking, decreased ROM, decreased strength, prosthetic dependency , and pain.   ACTIVITY LIMITATIONS: carrying, lifting, bending, standing, squatting, stairs, transfers, bathing, toileting, dressing, hygiene/grooming, and caring for others  PARTICIPATION LIMITATIONS: meal prep, cleaning, laundry, driving, shopping, community activity, occupation, and yard work  PERSONAL FACTORS: Age, Behavior pattern, Fitness, Past/current experiences, Profession, Time since onset of injury/illness/exacerbation, and 3+ comorbidities: anemia, breast cancer, HTN, PE's are also affecting patient's functional outcome.   REHAB POTENTIAL: Good  CLINICAL DECISION MAKING: Evolving/moderate complexity  EVALUATION COMPLEXITY: Moderate  PLAN:  PT FREQUENCY: 2x/week PT DURATION: 12 weeks PLANNED INTERVENTIONS: 97750- Physical Performance Testing, 97110-Therapeutic exercises, 97530- Therapeutic activity, 97112- Neuromuscular re-education, 97535- Self Care, 02859- Manual therapy, [phone]- Gait training, [phone]- Orthotic/Prosthetic subsequent, Patient/Family education, Balance training, Stair training, Joint mobilization, and DME instructions  PLAN FOR NEXT SESSION:  Cardiorespiratory training will certainly help in future once pressure tolerance it optimized, will plan to focus on this and balance while  appointment with [facility] awaits.   4:44 PM, 05/30/24 Peggye JAYSON Linear, PT, DPT Physical Therapist - [facility]  Outpatient Physical Therapy- Main Campus [phone]

## 2024-06-04 ENCOUNTER — Ambulatory Visit

## 2024-06-04 DIAGNOSIS — R262 Difficulty in walking, not elsewhere classified: Secondary | ICD-10-CM | POA: Diagnosis not present

## 2024-06-04 DIAGNOSIS — M6281 Muscle weakness (generalized): Secondary | ICD-10-CM

## 2024-06-04 NOTE — Therapy (Signed)
 OUTPATIENT PHYSICAL THERAPY TREATMENT  Patient Name: Penny Chase MRN: [id number] DOB:Jan 08, 1971, 53 y.o., female Today's Date: 06/04/2024  PCP: [facility] REFERRING PROVIDER: Delores Orvin BRAVO, NP  END OF SESSION:  PT End of Session - 06/04/24 1625     Visit Number 9    Number of Visits 25    Date for Recertification  07/25/24    Authorization Type Aetna    Authorization Time Period Aetna [facility]    Progress Note Due on Visit 10    PT Start Time 1617    PT Stop Time 1657    PT Time Calculation (min) 40 min    Equipment Utilized During Treatment Gait belt    Activity Tolerance Patient tolerated treatment well;No increased pain    Behavior During Therapy [facility] for tasks assessed/performed           Past Medical History:  Diagnosis Date   Acute lower limb ischemia    Anemia    Breast cancer (HCC)    Cancer (HCC) 09/2018   Right breast   COVID-19 11/2018   Family history of breast cancer    GERD (gastroesophageal reflux disease)    Hypertension    Personal history of radiation therapy 2020   Rt breast   Pulmonary embolism [facility])    Past Surgical History:  Procedure Laterality Date   AMPUTATION Left 11/26/2023   Procedure: AMPUTATION BELOW KNEE;  Surgeon: Jama Cordella MATSU, MD;  Location: [facility] ORS;  Service: Vascular;  Laterality: Left;   BREAST BIOPSY Right 09/12/2018   us  core/venus clip-INVASIVE MAMMARYLobular CARCINOMA, WITH LOBULAR FEATURES   BREAST LUMPECTOMY Right 10/04/2018   BREAST LUMPECTOMY WITH NEEDLE LOCALIZATION AND AXILLARY SENTINEL LYMPH NODE BX Right 10/04/2018   Procedure: BREAST LUMPECTOMY WITH NEEDLE LOCALIZATION AND AXILLARY SENTINEL LYMPH NODE BX;  Surgeon: Rodolph Romano, MD;  Location: [facility] ORS;  Service: General;  Laterality: Right;   COLONOSCOPY N/A 11/28/2023   Procedure: COLONOSCOPY;  Surgeon: Jinny Carmine, MD;  Location: [facility] ENDOSCOPY;  Service: Endoscopy;  Laterality: N/A;   ESOPHAGOGASTRODUODENOSCOPY N/A  11/28/2023   Procedure: EGD (ESOPHAGOGASTRODUODENOSCOPY);  Surgeon: Jinny Carmine, MD;  Location[facility] ENDOSCOPY;  Service: Endoscopy;  Laterality: N/A;   ESOPHAGOGASTRODUODENOSCOPY N/A 04/02/2024   Procedure: EGD (ESOPHAGOGASTRODUODENOSCOPY);  Surgeon: Therisa Bi, MD;  Location[facility] ENDOSCOPY;  Service: Gastroenterology;  Laterality: N/A;   GIVENS CAPSULE STUDY N/A 11/28/2023   Procedure: IMAGING PROCEDURE, GI TRACT, INTRALUMINAL, VIA CAPSULE;  Surgeon: Jinny Carmine, MD;  Location: [facility] ENDOSCOPY;  Service: Endoscopy;  Laterality: N/A;   IVC FILTER INSERTION N/A 11/28/2023   Procedure: IVC FILTER INSERTION;  Surgeon: Jama Cordella MATSU, MD;  Location: [facility] INVASIVE CV LAB;  Service: Cardiovascular;  Laterality: N/A;   LOWER EXTREMITY ANGIOGRAPHY Left 11/24/2023   Procedure: Lower Extremity Angiography;  Surgeon: Jama Cordella MATSU, MD;  Location: [facility] INVASIVE CV LAB;  Service: Cardiovascular;  Laterality: Left;   TONSILLECTOMY     Patient Active Problem List   Diagnosis Date Noted   Lumbar spondylosis 03/08/2024   Complex regional pain syndrome type 2 of left lower extremity 03/08/2024   Atherosclerosis of native arteries of extremity with intermittent claudication 03/02/2024   Phantom limb syndrome with pain (HCC) 03/01/2024   Diverticulitis of colon 03/01/2024   Pain 02/13/2024   Hx of left BKA (HCC) 01/25/2024   Symptomatic anemia 12/01/2023   Multiple gastric ulcers 11/28/2023   Ischemic colitis 11/28/2023   Bloody stools 11/26/2023   Rectal bleeding 11/26/2023   Acute blood loss anemia  11/26/2023   Blister of left foot 11/24/2023   Limb ischemia 11/23/2023   Gangrene of left foot (HCC) 11/23/2023   Tobacco abuse 11/23/2023   PE (pulmonary thromboembolism) (HCC) 11/23/2023   GI bleeding 11/23/2023   Substance abuse (HCC) 11/23/2023   Leukocytosis 11/23/2023   Bipolar affective disorder, current episode manic (HCC) 01/06/2021   Brief psychotic disorder (HCC) 01/03/2021   Family  history of breast cancer    Carcinoma of upper-outer quadrant of right breast in female, estrogen receptor positive (HCC) 09/20/2018   Gastroesophageal reflux disease without esophagitis 01/04/2017   HTN (hypertension), benign 01/20/2014    ONSET DATE: 04/11/24  REFERRING DIAG: S10.487 (ICD-10-CM) - Hx of left BKA (HCC)   THERAPY DIAG:  Difficulty in walking, not elsewhere classified  Muscle weakness (generalized)  Rationale for Evaluation and Treatment: Rehabilitation  SUBJECTIVE:                                                                                                                                                                                             SUBJECTIVE STATEMENT: Pt reports she is doing ok today, was in the [place] 4 [place] today [place] so she is cranky.   PERTINENT HISTORY: Patient is a 53 year old female with acute limb ischemia s/p left BKA. PE in association with lower GI bleed s/p IVC filter.  PAIN:  Are you having pain? 6/10 back pain (8/10 earlier) thanks it may be her colitis.     PRECAUTIONS: None WEIGHT BEARING RESTRICTIONS: WBAT FALLS: Has patient fallen in last 6 months? Yes. Number of falls 2  LIVING ENVIRONMENT: Lives with: lives with their spouse Lives in: House/apartment Stairs: Ramped entrance Has following equipment at home: Environmental consultant - 2 wheeled, Wheelchair (manual), and Architectural technologist  PLOF: Independent  PATIENT GOALS: Get out of the wheelchair and be able to get outside and do yard work that needs to be done.  OBJECTIVE:  Note: Objective measures were completed at Evaluation unless otherwise noted.  DIAGNOSTIC FINDINGS:  EXAM: MRI LUMBAR SPINE WITHOUT CONTRAST  IMPRESSION: Generally mild for age lumbar spine degeneration. No spinal stenosis or convincing neural impingement.   However, small left-side annular fissures of the discs at L4-L5 and L5-S1, which might be a source for Left L5 and/or S1 radiculitis (respectively).    LOWER EXTREMITY MMT:    MMT Right Eval Left Eval  Hip flexion 4 5  Knee flexion 5 3+  Knee extension 5 3+  (Blank rows = not tested)  TREATMENT DATE: 06/04/24 -supervision transfer to Nustep  -Nustep seat 5, arms 7, level 1: 4 minutes (end HR 121bpm)  -3:00 recovery (HR at 105bpm)  -Nustep seat 5, arms 7, level 1: 4 minutes (end HR 129bpm)  -3:00 recovery (HR at 107bpm) -Level 4 x 60sec at max, all out effort (end HR 129bpm)  -attempted AMB overground, having more knee pain and and limping almost immediately, medial whip persists,  -seated socket doff, don -AMB again in // bars, still has pain and whip, more knee flexion than is typical  Pt reports knee is definitely tighter than typical -seated knee extension stretch with sel foverpression x90sec  -STS from chair x10 (painful, fatigued)     05/30/24 -AMB WC to Nustep (79ft) RW  -nustep level 1: seat 5, arms 7: SPM >70 -4 minutes (HR 124)  -3 minute recovery  -3 minutes (HR 128)  -3 minute recovery  -3 minutes  -3 minute recovery   -11ftAMB overground c RW (pt trying to move quickly) 44m8s  -medial whip noted today with some additional toe-in noted in mid stance, unclear if this has been an issue previously.  *uploaded pictures of exposed firm edge of anterior tibia.   05/28/24 -Left hamstrings stretch 1x 2 minutes, 1x60secx  -overground AMB 166ft c RW 28m24s -overground AMB 19ft c RW 25m15s -Left  LAQ 1x15 x3secH (2lb AW)  -seated LLA hip/knee flexion x15 -Left LAQ YTB x10, the x15 GTB -inspection of anterior tibia (sharp ridge noted from anterior tip distal and medial)  -tandem stance on balance beam x15sec 3xeach side  PATIENT EDUCATION: Education details: Pt educated on role of PT and services provided during current POC, along with prognosis and information about the clinic. Person  educated: Patient and Spouse Education method: Explanation Education comprehension: verbalized understanding  HOME EXERCISE PROGRAM: Access Code: [email or web address] URL: [email or web address] Date: 05/14/2024 Prepared by: Peggye Linear  Exercises - Forward and backward step Right leg   - 1 x daily - 7 x weekly - 3 sets - 15 reps - Standing Hip Abduction with Counter Support  - 1 x daily - 7 x weekly - 2 sets - 10 reps - Mini Squat with Counter Support  - 1 x daily - 7 x weekly - 2 sets - 10 reps - Seated Hamstring Stretch  - 2 x daily - 1 sets - 2 reps - 45sec hold - Seated Knee Extension Stretch with Chair  - 2 x daily - 1 sets - 2 reps - 45sec hold - Supine Knee Extension Stretch on Towel Roll  - 2 x daily - 1 sets - 2 reps - 90 hold  GOALS: Goals reviewed with patient? Yes  SHORT TERM GOALS: Target date: 05/30/2024  Pt will be independent with HEP in order to demonstrate increased ability to perform tasks related to occupation/hobbies. Baseline: Goal status: INITIAL  LONG TERM GOALS: Target date: 07/25/2024  1.  Patient (> 37 years old) will complete five times sit to stand test in < 15 seconds indicating an increased LE strength and improved balance. Baseline: 11.36 sec Goal status: INITIAL   2.  Patient will reduce timed up and go to <11 seconds to reduce fall risk and demonstrate improved transfer/gait ability. Baseline: 19.53 sec Goal status: INITIAL  3.  Patient will increase 10 meter walk test to >1.82m/s as to improve gait speed for better community ambulation and to reduce fall risk. Baseline: TBD Goal status: INITIAL  4.  Patient will increase six minute walk  test distance to >1000 for progression to community ambulator and improve gait ability. Baseline: 112 feet in 2 min and 8 sec  Goal status: INITIAL  5.  Pt will improve their Locomotor Capabilities Index (LCI-5) score by at least 7 points (MCID), demonstrating a clinically meaningful increase in  ability to perform basic locomotor tasks with assistance and/or use of aids. Baseline: TBD Goal Status: INITIAL  ASSESSMENT:  CLINICAL IMPRESSION: Continued with interval training to improve cardiac output and activity tolerance. Pt already showing signs of improved activity tolerance albeit mild. Maintained other levels of volume in loading for BLE AMB and balance as socket fit is limited. Cardiorespiratory training will certainly help in future once pressure tolerance it optimized, will plan to focus on this and balance while appointment with [facility] awaits. Author has concerns about current socket interface and ability to achieve adequate pressure offloading, also need to make prosthetist aware of medial whip for pilon adjustment. Will plan to call next day during business hours. Pt will benefit from skilled therapy to assist pt with transfer training, mobility, gait, and strength training to improve overall tolerance to ambulation and improve current QoL and return as close to prior level of function.    OBJECTIVE IMPAIRMENTS: Abnormal gait, decreased activity tolerance, decreased balance, decreased endurance, decreased knowledge of use of DME, decreased mobility, difficulty walking, decreased ROM, decreased strength, prosthetic dependency , and pain.   ACTIVITY LIMITATIONS: carrying, lifting, bending, standing, squatting, stairs, transfers, bathing, toileting, dressing, hygiene/grooming, and caring for others  PARTICIPATION LIMITATIONS: meal prep, cleaning, laundry, driving, shopping, community activity, occupation, and yard work  PERSONAL FACTORS: Age, Behavior pattern, Fitness, Past/current experiences, Profession, Time since onset of injury/illness/exacerbation, and 3+ comorbidities: anemia, breast cancer, HTN, PE's are also affecting patient's functional outcome.   REHAB POTENTIAL: Good  CLINICAL DECISION MAKING: Evolving/moderate complexity  EVALUATION COMPLEXITY:  Moderate  PLAN:  PT FREQUENCY: 2x/week PT DURATION: 12 weeks PLANNED INTERVENTIONS: 97750- Physical Performance Testing, 97110-Therapeutic exercises, 97530- Therapeutic activity, 97112- Neuromuscular re-education, 97535- Self Care, [id number]- Manual therapy, [phone]- Gait training, [phone]- Orthotic/Prosthetic subsequent, Patient/Family education, Balance training, Stair training, Joint mobilization, and DME instructions  PLAN FOR NEXT SESSION:  Continue to advance interval training on Nustep, cautious with volume of overground AMB until socket is refit   4:31 PM, 06/04/24 Peggye JAYSON Linear, PT, DPT Physical Therapist - [facility]  Outpatient Physical Therapy- Main Campus [phone]

## 2024-06-05 ENCOUNTER — Telehealth: Payer: Self-pay

## 2024-06-05 NOTE — Telephone Encounter (Signed)
 Author contacted prosthetist office to make aware of authro's concerns- 1. Poor tissue off loading at distal anterior tibia and socket; quite bony and exposed, will need some troubleshooting and adjustments. 2. Medial whip in gait which may indicate pylon adjustment of toe angle.   Dawn took message and plans to forward to [place] prior to upcoming appointment in November.   9:53 AM, 06/05/24 Peggye JAYSON Linear, PT, DPT Physical Therapist - [facility]  Outpatient Physical Therapy- Main Campus [phone]

## 2024-06-06 ENCOUNTER — Ambulatory Visit: Admitting: Physical Therapy

## 2024-06-06 ENCOUNTER — Telehealth: Payer: Self-pay | Admitting: Physical Therapy

## 2024-06-06 NOTE — Telephone Encounter (Signed)
 Pt did not arrive for her scheduled physical therapy appointment. Reached out to patient via telephone; pt reporting that she had thought that her appointment for today was scheduled for 4:15 PM. Pt notified that there were no other available therapist at this time. Pt reminded of next appointment at 4:15 on 06/11/24.   Penny Chase, SPT Physical Therapy Student - [place]  [facility]  4:04 PM 06/06/24

## 2024-06-11 ENCOUNTER — Ambulatory Visit: Admitting: Physical Therapy

## 2024-06-11 DIAGNOSIS — M6281 Muscle weakness (generalized): Secondary | ICD-10-CM

## 2024-06-11 DIAGNOSIS — R262 Difficulty in walking, not elsewhere classified: Secondary | ICD-10-CM | POA: Diagnosis not present

## 2024-06-11 NOTE — Therapy (Signed)
 OUTPATIENT PHYSICAL THERAPY TREATMENT/Physical Therapy Progress Note   Dates of reporting period  05/02/24   to   06/11/24   Patient Name: Penny Chase MRN: [id number] DOB:1970/11/17, 53 y.o., female Today's Date: 06/11/2024  PCP: [facility] REFERRING PROVIDER: Delores Orvin BRAVO, NP  END OF SESSION:   PT End of Session - 06/11/24 1622     Visit Number 10    Number of Visits 25    Date for Recertification  07/25/24    Authorization Type Aetna    Authorization Time Period [employer] [facility]    Progress Note Due on Visit 10    PT Start Time 1622    PT Stop Time 1703    PT Time Calculation (min) 41 min    Equipment Utilized During Treatment Gait belt    Activity Tolerance Patient tolerated treatment well;No increased pain    Behavior During Therapy [facility] for tasks assessed/performed            Past Medical History:  Diagnosis Date   Acute lower limb ischemia    Anemia    Breast cancer (HCC)    Cancer (HCC) 09/2018   Right breast   COVID-19 11/2018   Family history of breast cancer    GERD (gastroesophageal reflux disease)    Hypertension    Personal history of radiation therapy 2020   Rt breast   Pulmonary embolism [facility])    Past Surgical History:  Procedure Laterality Date   AMPUTATION Left 11/26/2023   Procedure: AMPUTATION BELOW KNEE;  Surgeon: Jama Cordella MATSU, MD;  Location: [facility] ORS;  Service: Vascular;  Laterality: Left;   BREAST BIOPSY Right 09/12/2018   us  core/venus clip-INVASIVE MAMMARYLobular CARCINOMA, WITH LOBULAR FEATURES   BREAST LUMPECTOMY Right 10/04/2018   BREAST LUMPECTOMY WITH NEEDLE LOCALIZATION AND AXILLARY SENTINEL LYMPH NODE BX Right 10/04/2018   Procedure: BREAST LUMPECTOMY WITH NEEDLE LOCALIZATION AND AXILLARY SENTINEL LYMPH NODE BX;  Surgeon: Rodolph Romano, MD;  Location: [facility] ORS;  Service: General;  Laterality: Right;   COLONOSCOPY N/A 11/28/2023   Procedure: COLONOSCOPY;  Surgeon: Jinny Carmine, MD;  Location: [facility]  ENDOSCOPY;  Service: Endoscopy;  Laterality: N/A;   ESOPHAGOGASTRODUODENOSCOPY N/A 11/28/2023   Procedure: EGD (ESOPHAGOGASTRODUODENOSCOPY);  Surgeon: Jinny Carmine, MD;  Location: [facility] ENDOSCOPY;  Service: Endoscopy;  Laterality: N/A;   ESOPHAGOGASTRODUODENOSCOPY N/A 04/02/2024   Procedure: EGD (ESOPHAGOGASTRODUODENOSCOPY);  Surgeon: Therisa Bi, MD;  Location: [facility] ENDOSCOPY;  Service: Gastroenterology;  Laterality: N/A;   GIVENS CAPSULE STUDY N/A 11/28/2023   Procedure: IMAGING PROCEDURE, GI TRACT, INTRALUMINAL, VIA CAPSULE;  Surgeon: Jinny Carmine, MD;  Location: [facility] ENDOSCOPY;  Service: Endoscopy;  Laterality: N/A;   IVC FILTER INSERTION N/A 11/28/2023   Procedure: IVC FILTER INSERTION;  Surgeon: Jama Cordella MATSU, MD;  Location: [facility] INVASIVE CV LAB;  Service: Cardiovascular;  Laterality: N/A;   LOWER EXTREMITY ANGIOGRAPHY Left 11/24/2023   Procedure: Lower Extremity Angiography;  Surgeon: Jama Cordella MATSU, MD;  Location: [facility] INVASIVE CV LAB;  Service: Cardiovascular;  Laterality: Left;   TONSILLECTOMY     Patient Active Problem List   Diagnosis Date Noted   Lumbar spondylosis 03/08/2024   Complex regional pain syndrome type 2 of left lower extremity 03/08/2024   Atherosclerosis of native arteries of extremity with intermittent claudication 03/02/2024   Phantom limb syndrome with pain (HCC) 03/01/2024   Diverticulitis of colon 03/01/2024   Pain 02/13/2024   Hx of left BKA (HCC) 01/25/2024   Symptomatic anemia 12/01/2023   Multiple gastric ulcers 11/28/2023  Ischemic colitis 11/28/2023   Bloody stools 11/26/2023   Rectal bleeding 11/26/2023   Acute blood loss anemia 11/26/2023   Blister of left foot 11/24/2023   Limb ischemia 11/23/2023   Gangrene of left foot (HCC) 11/23/2023   Tobacco abuse 11/23/2023   PE (pulmonary thromboembolism) (HCC) 11/23/2023   GI bleeding 11/23/2023   Substance abuse (HCC) 11/23/2023   Leukocytosis 11/23/2023   Bipolar affective disorder, current  episode manic (HCC) 01/06/2021   Brief psychotic disorder (HCC) 01/03/2021   Family history of breast cancer    Carcinoma of upper-outer quadrant of right breast in female, estrogen receptor positive (HCC) 09/20/2018   Gastroesophageal reflux disease without esophagitis 01/04/2017   HTN (hypertension), benign 01/20/2014    ONSET DATE: 04/11/24  REFERRING DIAG: S10.487 (ICD-10-CM) - Hx of left BKA (HCC)   THERAPY DIAG:  Difficulty in walking, not elsewhere classified  Muscle weakness (generalized)  Rationale for Evaluation and Treatment: Rehabilitation  SUBJECTIVE:                                                                                                                                                                                             SUBJECTIVE STATEMENT: Pt reports she is doing okay today, reporting some back pain today.  Follow up with prosthetist scheduled for next Wed 06/20/24.    PERTINENT HISTORY: Patient is a 53 year old female with acute limb ischemia s/p left BKA. PE in association with lower GI bleed s/p IVC filter.  PAIN:  Are you having pain? 6/10 back pain (8/10 earlier) thanks it may be her colitis.     PRECAUTIONS: None WEIGHT BEARING RESTRICTIONS: WBAT FALLS: Has patient fallen in last 6 months? Yes. Number of falls 2  LIVING ENVIRONMENT: Lives with: lives with their spouse Lives in: House/apartment Stairs: Ramped entrance Has following equipment at home: Environmental Consultant - 2 wheeled, Wheelchair (manual), and Architectural Technologist  PLOF: Independent  PATIENT GOALS: Get out of the wheelchair and be able to get outside and do yard work that needs to be done.  OBJECTIVE:  Note: Objective measures were completed at Evaluation unless otherwise noted.  DIAGNOSTIC FINDINGS:  EXAM: MRI LUMBAR SPINE WITHOUT CONTRAST  IMPRESSION: Generally mild for age lumbar spine degeneration. No spinal stenosis or convincing neural impingement.   However, small left-side  annular fissures of the discs at L4-L5 and L5-S1, which might be a source for Left L5 and/or S1 radiculitis (respectively).   LOWER EXTREMITY MMT:    MMT Right Eval Left Eval  Hip flexion 4 5  Knee flexion 5 3+  Knee extension 5 3+  (Blank rows = not  tested)                                                                                                                              TREATMENT DATE: 06/11/24  Progress note: assessed LCI-5 & 5xSTS d/t limited ambulation secondary to increased knee pain.    LCI-5:  Get up from chair: 4 Pick up object from floor in standing: 0 Get up from floor: 3 Walk in house: 3 Walk outside on even ground: 0 Walk outside on uneven ground: 0 Walk outside in inclement weather: 0 Up stairs with rail: 4 Down stairs with rail: 4 Step up curb: 4 Step down curb: 4 Up stairs without rail: 0 Down stairs without rail: 0 Walk while carrying object: 3 TOTAL: 29   -supervision transfer to Nustep  -Nustep seat 7, arms 8, level 1: 4 minutes (end HR 91bpm)  -3:00 recovery (HR at 82bpm)  -Nustep seat 7, arms 8, level 1: 4 minutes (end HR 93bpm)  -3:00 recovery (HR at 81bpm) -Nustep seat 7, arms 8, level 1: 4 minutes (end HR 104bpm)  Five times Sit to Stand Test (FTSS) "Stand up and sit down as quickly as possible 5 times, keeping your arms folded across your chest."    TIME: 12.73 seconds  Times > 13.6 seconds is associated with increased disability and morbidity (Guralnik, 2000) Times > 15 seconds is predictive of recurrent falls in healthy individuals aged 61 and older (Buatois, et al., 2008) Normal performance values in community dwelling individuals aged 19 and older (Bohannon, 2006): 60-69 years: 11.4 seconds 70-79 years: 12.6 seconds 80-89 years: 14.8 seconds  MCID: >= 2.3 seconds for Vestibular Disorders (Meretta, 2006)   -attempted AMB in // bars, pt continuing to note knee pain and pt heavily off weighting LLE with use of BUEs. No  significant medial whip noted this date, likely secondary to heavy off weighting of LLE.  -seated socket doff, don; assessed skin integrity of residual limb. No significant redness noted at bony prominences. Pt reporting some sensitivity at distal end.   Pt reports completing seated hamstring stretch most often, noting tightness in the knee rather than feeling stretch at hamstrings. Pt educated to trial remaining 2 knee extension stretches prior to next session & report back with response.     05/30/24 -AMB WC to Nustep (42ft) RW  -nustep level 1: seat 5, arms 7: SPM >70 -4 minutes (HR 124)  -3 minute recovery  -3 minutes (HR 128)  -3 minute recovery  -3 minutes  -3 minute recovery   -151ftAMB overground c RW (pt trying to move quickly) 67m8s  -medial whip noted today with some additional toe-in noted in mid stance, unclear if this has been an issue previously.  *uploaded pictures of exposed firm edge of anterior tibia.   05/28/24 -Left hamstrings stretch 1x 2 minutes, 1x60secx  -overground AMB 183ft c RW 36m24s -overground AMB 188ft c RW 47m15s -Left  LAQ 1x15 x3secH (  2lb AW)  -seated LLA hip/knee flexion x15 -Left LAQ YTB x10, the x15 GTB -inspection of anterior tibia (sharp ridge noted from anterior tip distal and medial)  -tandem stance on balance beam x15sec 3xeach side  PATIENT EDUCATION: Education details: Pt educated on role of PT and services provided during current POC, along with prognosis and information about the clinic. Person educated: Patient and Spouse Education method: Explanation Education comprehension: verbalized understanding  HOME EXERCISE PROGRAM: Access Code: [email or web address] URL: [email or web address] Date: 05/14/2024 Prepared by: Peggye Linear  Exercises - Forward and backward step Right leg   - 1 x daily - 7 x weekly - 3 sets - 15 reps - Standing Hip Abduction with Counter Support  - 1 x daily - 7 x weekly - 2 sets - 10 reps - Mini Squat  with Counter Support  - 1 x daily - 7 x weekly - 2 sets - 10 reps - Seated Hamstring Stretch  - 2 x daily - 1 sets - 2 reps - 45sec hold - Seated Knee Extension Stretch with Chair  - 2 x daily - 1 sets - 2 reps - 45sec hold - Supine Knee Extension Stretch on Towel Roll  - 2 x daily - 1 sets - 2 reps - 90 hold  GOALS: Goals reviewed with patient? Yes  SHORT TERM GOALS: Target date: 05/30/2024  Pt will be independent with HEP in order to demonstrate increased ability to perform tasks related to occupation/hobbies. Baseline: Goal status: INITIAL  LONG TERM GOALS: Target date: 07/25/2024  1.  Patient (> 31 years old) will complete five times sit to stand test in < 15 seconds indicating an increased LE strength and improved balance. Baseline: 11.36 sec 06/11/24: 12.73 sec Goal status: INITIAL   2.  Patient will reduce timed up and go to <11 seconds to reduce fall risk and demonstrate improved transfer/gait ability. Baseline: 19.53 sec Goal status: INITIAL  3.  Patient will increase 10 meter walk test to >1.66m/s as to improve gait speed for better community ambulation and to reduce fall risk. Baseline: TBD Goal status: INITIAL  4.  Patient will increase six minute walk test distance to >1000 for progression to community ambulator and improve gait ability. Baseline: 112 feet in 2 min and 8 sec  Goal status: INITIAL  5.  Pt will improve their Locomotor Capabilities Index (LCI-5) score by at least 7 points (MCID), demonstrating a clinically meaningful increase in ability to perform basic locomotor tasks with assistance and/or use of aids. Baseline: 06/11/24: 29 Goal Status: INITIAL  ASSESSMENT:  CLINICAL IMPRESSION:  Continued with interval training to improve cardiac output and activity tolerance. Today's session limited by increased knee pain; limiting walking activities, including TUG, , for progress note. Plan to assess these measures at future session when no longer  limited by knee pain. Assessed LCI-5 score to be 29 this date; pt most limited in picking up object from floor in standing, walking outdoors, & stair navigation without hand rail. Pt will benefit from skilled therapy to assist pt with transfer training, mobility, gait, and strength training to improve overall tolerance to ambulation and improve current QoL and return as close to prior level of function. Patient's condition has the potential to improve in response to therapy. Maximum improvement is yet to be obtained. The anticipated improvement is attainable and reasonable in a generally predictable time.      OBJECTIVE IMPAIRMENTS: Abnormal gait, decreased activity tolerance, decreased balance, decreased endurance, decreased knowledge of use  of DME, decreased mobility, difficulty walking, decreased ROM, decreased strength, prosthetic dependency , and pain.   ACTIVITY LIMITATIONS: carrying, lifting, bending, standing, squatting, stairs, transfers, bathing, toileting, dressing, hygiene/grooming, and caring for others  PARTICIPATION LIMITATIONS: meal prep, cleaning, laundry, driving, shopping, community activity, occupation, and yard work  PERSONAL FACTORS: Age, Behavior pattern, Fitness, Past/current experiences, Profession, Time since onset of injury/illness/exacerbation, and 3+ comorbidities: anemia, breast cancer, HTN, PE's are also affecting patient's functional outcome.   REHAB POTENTIAL: Good  CLINICAL DECISION MAKING: Evolving/moderate complexity  EVALUATION COMPLEXITY: Moderate  PLAN:  PT FREQUENCY: 2x/week PT DURATION: 12 weeks PLANNED INTERVENTIONS: 97750- Physical Performance Testing, 97110-Therapeutic exercises, 97530- Therapeutic activity, [id number]- Neuromuscular re-education, 97535- Self Care, [id number]- Manual therapy, [phone]- Gait training, [phone]- Orthotic/Prosthetic subsequent, Patient/Family education, Balance training, Stair training, Joint mobilization, and DME instructions  PLAN  FOR NEXT SESSION:  Follow up regarding HEP  Complete , TUG, as appropriate based on knee pain.  Continue to advance interval training on Nustep, cautious with volume of overground AMB until socket is refit   5:14 PM, 06/11/24 Chiquita Silvan, SPT Physical Therapy Student - [place]  [facility]

## 2024-06-13 ENCOUNTER — Ambulatory Visit: Admitting: Physical Therapy

## 2024-06-13 DIAGNOSIS — R262 Difficulty in walking, not elsewhere classified: Secondary | ICD-10-CM | POA: Diagnosis not present

## 2024-06-13 DIAGNOSIS — M6281 Muscle weakness (generalized): Secondary | ICD-10-CM

## 2024-06-13 NOTE — Therapy (Signed)
 OUTPATIENT PHYSICAL THERAPY TREATMENT   Patient Name: Penny Chase MRN: [id number] DOB:10/31/1970, 53 y.o., female Today's Date: 06/13/2024  PCP: [facility] REFERRING PROVIDER: Delores Orvin BRAVO, NP  END OF SESSION:   PT End of Session - 06/13/24 1624     Visit Number 11    Number of Visits 25    Date for Recertification  07/25/24    Authorization Type Aetna    Authorization Time Period Aetna [facility]    Progress Note Due on Visit 10    PT Start Time 1624    PT Stop Time 1703    PT Time Calculation (min) 39 min    Equipment Utilized During Treatment Gait belt    Activity Tolerance Patient tolerated treatment well;No increased pain    Behavior During Therapy [facility] for tasks assessed/performed            Past Medical History:  Diagnosis Date   Acute lower limb ischemia    Anemia    Breast cancer (HCC)    Cancer (HCC) 09/2018   Right breast   COVID-19 11/2018   Family history of breast cancer    GERD (gastroesophageal reflux disease)    Hypertension    Personal history of radiation therapy 2020   Rt breast   Pulmonary embolism [facility])    Past Surgical History:  Procedure Laterality Date   AMPUTATION Left 11/26/2023   Procedure: AMPUTATION BELOW KNEE;  Surgeon: Jama Cordella MATSU, MD;  Location: [facility] ORS;  Service: Vascular;  Laterality: Left;   BREAST BIOPSY Right 09/12/2018   us  core/venus clip-INVASIVE MAMMARYLobular CARCINOMA, WITH LOBULAR FEATURES   BREAST LUMPECTOMY Right 10/04/2018   BREAST LUMPECTOMY WITH NEEDLE LOCALIZATION AND AXILLARY SENTINEL LYMPH NODE BX Right 10/04/2018   Procedure: BREAST LUMPECTOMY WITH NEEDLE LOCALIZATION AND AXILLARY SENTINEL LYMPH NODE BX;  Surgeon: Rodolph Romano, MD;  Location: [facility] ORS;  Service: General;  Laterality: Right;   COLONOSCOPY N/A 11/28/2023   Procedure: COLONOSCOPY;  Surgeon: Jinny Carmine, MD;  Location: [facility] ENDOSCOPY;  Service: Endoscopy;  Laterality: N/A;   ESOPHAGOGASTRODUODENOSCOPY N/A  11/28/2023   Procedure: EGD (ESOPHAGOGASTRODUODENOSCOPY);  Surgeon: Jinny Carmine, MD;  Location: [facility] ENDOSCOPY;  Service: Endoscopy;  Laterality: N/A;   ESOPHAGOGASTRODUODENOSCOPY N/A 04/02/2024   Procedure: EGD (ESOPHAGOGASTRODUODENOSCOPY);  Surgeon: Therisa Bi, MD;  Location: [facility] ENDOSCOPY;  Service: Gastroenterology;  Laterality: N/A;   GIVENS CAPSULE STUDY N/A 11/28/2023   Procedure: IMAGING PROCEDURE, GI TRACT, INTRALUMINAL, VIA CAPSULE;  Surgeon: Jinny Carmine, MD;  Location: [facility] ENDOSCOPY;  Service: Endoscopy;  Laterality: N/A;   IVC FILTER INSERTION N/A 11/28/2023   Procedure: IVC FILTER INSERTION;  Surgeon: Jama Cordella MATSU, MD;  Location: [facility] INVASIVE CV LAB;  Service: Cardiovascular;  Laterality: N/A;   LOWER EXTREMITY ANGIOGRAPHY Left 11/24/2023   Procedure: Lower Extremity Angiography;  Surgeon: Jama Cordella MATSU, MD;  Location: [facility] INVASIVE CV LAB;  Service: Cardiovascular;  Laterality: Left;   TONSILLECTOMY     Patient Active Problem List   Diagnosis Date Noted   Lumbar spondylosis 03/08/2024   Complex regional pain syndrome type 2 of left lower extremity 03/08/2024   Atherosclerosis of native arteries of extremity with intermittent claudication 03/02/2024   Phantom limb syndrome with pain (HCC) 03/01/2024   Diverticulitis of colon 03/01/2024   Pain 02/13/2024   Hx of left BKA (HCC) 01/25/2024   Symptomatic anemia 12/01/2023   Multiple gastric ulcers 11/28/2023   Ischemic colitis 11/28/2023   Bloody stools 11/26/2023   Rectal bleeding 11/26/2023   Acute  blood loss anemia 11/26/2023   Blister of left foot 11/24/2023   Limb ischemia 11/23/2023   Gangrene of left foot (HCC) 11/23/2023   Tobacco abuse 11/23/2023   PE (pulmonary thromboembolism) (HCC) 11/23/2023   GI bleeding 11/23/2023   Substance abuse (HCC) 11/23/2023   Leukocytosis 11/23/2023   Bipolar affective disorder, current episode manic (HCC) 01/06/2021   Brief psychotic disorder (HCC) 01/03/2021   Family  history of breast cancer    Carcinoma of upper-outer quadrant of right breast in female, estrogen receptor positive (HCC) 09/20/2018   Gastroesophageal reflux disease without esophagitis 01/04/2017   HTN (hypertension), benign 01/20/2014    ONSET DATE: 04/11/24  REFERRING DIAG: S10.487 (ICD-10-CM) - Hx of left BKA (HCC)   THERAPY DIAG:  Difficulty in walking, not elsewhere classified  Muscle weakness (generalized)  Rationale for Evaluation and Treatment: Rehabilitation  SUBJECTIVE:                                                                                                                                                                                             SUBJECTIVE STATEMENT: Pt reports she is doing okay today, reports that back and residual limb pain is still present today. Rates residual limb pain 7/10 in standing and low back pain rated 5/10.   PERTINENT HISTORY: Patient is a 53 year old female with acute limb ischemia s/p left BKA. PE in association with lower GI bleed s/p IVC filter.  PAIN:  Are you having pain? 6/10 back pain (8/10 earlier) thanks it may be her colitis.     PRECAUTIONS: None WEIGHT BEARING RESTRICTIONS: WBAT FALLS: Has patient fallen in last 6 months? Yes. Number of falls 2  LIVING ENVIRONMENT: Lives with: lives with their spouse Lives in: House/apartment Stairs: Ramped entrance Has following equipment at home: Environmental Consultant - 2 wheeled, Wheelchair (manual), and Architectural Technologist  PLOF: Independent  PATIENT GOALS: Get out of the wheelchair and be able to get outside and do yard work that needs to be done.  OBJECTIVE:  Note: Objective measures were completed at Evaluation unless otherwise noted.  DIAGNOSTIC FINDINGS:  EXAM: MRI LUMBAR SPINE WITHOUT CONTRAST  IMPRESSION: Generally mild for age lumbar spine degeneration. No spinal stenosis or convincing neural impingement.   However, small left-side annular fissures of the discs at L4-L5  and L5-S1, which might be a source for Left L5 and/or S1 radiculitis (respectively).   LOWER EXTREMITY MMT:    MMT Right Eval Left Eval  Hip flexion 4 5  Knee flexion 5 3+  Knee extension 5 3+  (Blank rows = not tested)  Progress note: assessed LCI-5 & 5xSTS  d/t limited ambulation secondary to increased knee pain.    LCI-5:  Get up from chair: 4 Pick up object from floor in standing: 0 Get up from floor: 3 Walk in house: 3 Walk outside on even ground: 0 Walk outside on uneven ground: 0 Walk outside in inclement weather: 0 Up stairs with rail: 4 Down stairs with rail: 4 Step up curb: 4 Step down curb: 4 Up stairs without rail: 0 Down stairs without rail: 0 Walk while carrying object: 3 TOTAL: 29                                                                                                                             TREATMENT DATE: 06/13/24  Amb to and from nustep with RW and prosthetic x 20 ft  Nustep 8 min rolling hill program level 4-8. Cues for improved WB through the LLE. No reported pain while utilizing nustep.   10 Meter Walk Test: Patient instructed to walk 10 meters (32.8 ft) as quickly and as safely as possible at their normal speed x2 and at a fast speed x2. Time measured from 2 meter mark to 8 meter mark to accommodate ramp-up and ramp-down.  Normal speed 1: 28.57sec  Normal speed 2: 31.12sec  Average Normal speed: 0.34 m/s  Cut off scores: <0.4 m/s = household Ambulator, 0.4-0.8 m/s = limited community Ambulator, >0.8 m/s = community Ambulator, >1.2 m/s = crossing a street, <1.0 = increased fall risk MCID 0.05 m/s (small), 0.13 m/s (moderate), 0.06 m/s (significant)  (ANPTA Core Set of Outcome Measures for Adults with Neurologic Conditions, 2018)  PT instructed pt in TUG: 25.52 sec (average of 3 trials; >13.5 sec indicates increased fall risk)  Pain greatly limiting time on and TUG. Due to pain; did not assess 6 min walk test.   HS stretch  performed while resting throughout session pt pt choice. Educated on benefits of hip flexor stretching in bed as well variations in prone or with LLE off EOB. Pt states that prone, may be more comfortable.   PT instructed pt in limb donning and doffing. Encouraged to bring additional ply socks in the future as it was noted to have increased space in proximal lower leg. Skin assessment performed and education for proper blanching of skin to rule out pressure and blood flow issues.   Once pt repositioned padded liner and socks, pt reports slightly reduced pain in residual limb     05/30/24 -AMB WC to Nustep (52ft) RW  -nustep level 1: seat 5, arms 7: SPM >70 -4 minutes (HR 124)  -3 minute recovery  -3 minutes (HR 128)  -3 minute recovery  -3 minutes  -3 minute recovery   -159ftAMB overground c RW (pt trying to move quickly) 69m8s  -medial whip noted today with some additional toe-in noted in mid stance, unclear if this has been an issue previously.  *uploaded pictures of exposed firm edge of anterior tibia.  05/28/24 -Left hamstrings stretch 1x 2 minutes, 1x60secx  -overground AMB 163ft c RW 80m24s -overground AMB 142ft c RW 10m15s -Left  LAQ 1x15 x3secH (2lb AW)  -seated LLA hip/knee flexion x15 -Left LAQ YTB x10, the x15 GTB -inspection of anterior tibia (sharp ridge noted from anterior tip distal and medial)  -tandem stance on balance beam x15sec 3xeach side  PATIENT EDUCATION: Education details: Pt educated on role of PT and services provided during current POC, along with prognosis and information about the clinic. Person educated: Patient and Spouse Education method: Explanation Education comprehension: verbalized understanding  HOME EXERCISE PROGRAM: Access Code: [email or web address] URL: [email or web address] Date: 05/14/2024 Prepared by: Peggye Linear  Exercises - Forward and backward step Right leg   - 1 x daily - 7 x weekly - 3 sets - 15 reps - Standing Hip  Abduction with Counter Support  - 1 x daily - 7 x weekly - 2 sets - 10 reps - Mini Squat with Counter Support  - 1 x daily - 7 x weekly - 2 sets - 10 reps - Seated Hamstring Stretch  - 2 x daily - 1 sets - 2 reps - 45sec hold - Seated Knee Extension Stretch with Chair  - 2 x daily - 1 sets - 2 reps - 45sec hold - Supine Knee Extension Stretch on Towel Roll  - 2 x daily - 1 sets - 2 reps - 90 hold  GOALS: Goals reviewed with patient? Yes  SHORT TERM GOALS: Target date: 05/30/2024  Pt will be independent with HEP in order to demonstrate increased ability to perform tasks related to occupation/hobbies. Baseline: Goal status: INITIAL  LONG TERM GOALS: Target date: 07/25/2024  1.  Patient (> 45 years old) will complete five times sit to stand test in < 15 seconds indicating an increased LE strength and improved balance. Baseline: 11.36 sec 06/11/24: 12.73 sec Goal status: INITIAL   2.  Patient will reduce timed up and go to <11 seconds to reduce fall risk and demonstrate improved transfer/gait ability. Baseline: 19.53 sec  10/29: 25.52 sec Goal status: INITIAL  3.  Patient will increase 10 meter walk test to >1.101m/s as to improve gait speed for better community ambulation and to reduce fall risk. Baseline: TBD Goal status: INITIAL  4.  Patient will increase six minute walk test distance to >1000 for progression to community ambulator and improve gait ability. Baseline: 112 feet in 2 min and 8 sec   Goal status: INITIAL  5.  Pt will improve their Locomotor Capabilities Index (LCI-5) score by at least 7 points (MCID), demonstrating a clinically meaningful increase in ability to perform basic locomotor tasks with assistance and/or use of aids. Baseline: 06/11/24: 29 Goal Status: INITIAL  ASSESSMENT:  CLINICAL IMPRESSION:  Continued with interval training to improve cardiac output and activity tolerance. Today's session limited by continued  increased knee pain; but completed TUG,  . Education for benefits of hip flexor stretching and increased use of ply socks as tolerated when positioning of limb is uncomfortable. Slight reduction in function noted in outcomes due to pain in the knee. Will continue to benefit from skilled PT to improve function, independence and improve QoL.     OBJECTIVE IMPAIRMENTS: Abnormal gait, decreased activity tolerance, decreased balance, decreased endurance, decreased knowledge of use of DME, decreased mobility, difficulty walking, decreased ROM, decreased strength, prosthetic dependency , and pain.   ACTIVITY LIMITATIONS: carrying, lifting, bending, standing, squatting, stairs, transfers, bathing, toileting, dressing, hygiene/grooming, and caring for others  PARTICIPATION LIMITATIONS: meal prep, cleaning, laundry, driving, shopping, community activity, occupation, and yard work  PERSONAL FACTORS: Age, Behavior pattern, Fitness, Past/current experiences, Profession, Time since onset of injury/illness/exacerbation, and 3+ comorbidities: anemia, breast cancer, HTN, PE's are also affecting patient's functional outcome.   REHAB POTENTIAL: Good  CLINICAL DECISION MAKING: Evolving/moderate complexity  EVALUATION COMPLEXITY: Moderate  PLAN:  PT FREQUENCY: 2x/week PT DURATION: 12 weeks PLANNED INTERVENTIONS: 97750- Physical Performance Testing, 97110-Therapeutic exercises, 97530- Therapeutic activity, [id number]- Neuromuscular re-education, 97535- Self Care, [id number]- Manual therapy, [phone]- Gait training, [phone]- Orthotic/Prosthetic subsequent, Patient/Family education, Balance training, Stair training, Joint mobilization, and DME instructions  PLAN FOR NEXT SESSION:  Follow up regarding HEP   as appropriate based on knee pain.  Continue to advance interval training on Nustep, cautious with volume of overground AMB until socket is refit   4:24 PM, 06/13/24 Chiquita Silvan, SPT Physical Therapy Student - [place]  [facility]'

## 2024-06-18 ENCOUNTER — Ambulatory Visit: Attending: Nurse Practitioner

## 2024-06-18 DIAGNOSIS — M6281 Muscle weakness (generalized): Secondary | ICD-10-CM | POA: Diagnosis present

## 2024-06-18 DIAGNOSIS — R262 Difficulty in walking, not elsewhere classified: Secondary | ICD-10-CM | POA: Insufficient documentation

## 2024-06-18 NOTE — Therapy (Signed)
 OUTPATIENT PHYSICAL THERAPY TREATMENT   Patient Name: Penny Chase MRN: [id number] DOB:Jan 05, 1971, 53 y.o., female Today's Date: 06/18/2024  PCP: [facility] REFERRING PROVIDER: Delores Orvin BRAVO, NP  END OF SESSION:   PT End of Session - 06/18/24 1620     Visit Number 12    Number of Visits 25    Date for Recertification  07/25/24    Authorization Type Aetna    Authorization Time Period Aetna [facility]    Progress Note Due on Visit 10    PT Start Time 1620    PT Stop Time 1700    PT Time Calculation (min) 40 min    Equipment Utilized During Treatment Gait belt    Activity Tolerance Patient tolerated treatment well;No increased pain    Behavior During Therapy [facility] for tasks assessed/performed         Past Medical History:  Diagnosis Date   Acute lower limb ischemia    Anemia    Breast cancer (HCC)    Cancer (HCC) 09/2018   Right breast   COVID-19 11/2018   Family history of breast cancer    GERD (gastroesophageal reflux disease)    Hypertension    Personal history of radiation therapy 2020   Rt breast   Pulmonary embolism [facility])    Past Surgical History:  Procedure Laterality Date   AMPUTATION Left 11/26/2023   Procedure: AMPUTATION BELOW KNEE;  Surgeon: Jama Cordella MATSU, MD;  Location: [facility] ORS;  Service: Vascular;  Laterality: Left;   BREAST BIOPSY Right 09/12/2018   us  core/venus clip-INVASIVE MAMMARYLobular CARCINOMA, WITH LOBULAR FEATURES   BREAST LUMPECTOMY Right 10/04/2018   BREAST LUMPECTOMY WITH NEEDLE LOCALIZATION AND AXILLARY SENTINEL LYMPH NODE BX Right 10/04/2018   Procedure: BREAST LUMPECTOMY WITH NEEDLE LOCALIZATION AND AXILLARY SENTINEL LYMPH NODE BX;  Surgeon: Rodolph Romano, MD;  Location: [facility] ORS;  Service: General;  Laterality: Right;   COLONOSCOPY N/A 11/28/2023   Procedure: COLONOSCOPY;  Surgeon: Jinny Carmine, MD;  Location: [facility] ENDOSCOPY;  Service: Endoscopy;  Laterality: N/A;   ESOPHAGOGASTRODUODENOSCOPY N/A 11/28/2023    Procedure: EGD (ESOPHAGOGASTRODUODENOSCOPY);  Surgeon: Jinny Carmine, MD;  Location: [facility] ENDOSCOPY;  Service: Endoscopy;  Laterality: N/A;   ESOPHAGOGASTRODUODENOSCOPY N/A 04/02/2024   Procedure: EGD (ESOPHAGOGASTRODUODENOSCOPY);  Surgeon: Therisa Bi, MD;  Location[facility] ENDOSCOPY;  Service: Gastroenterology;  Laterality: N/A;   GIVENS CAPSULE STUDY N/A 11/28/2023   Procedure: IMAGING PROCEDURE, GI TRACT, INTRALUMINAL, VIA CAPSULE;  Surgeon: Jinny Carmine, MD;  Location: [facility] ENDOSCOPY;  Service: Endoscopy;  Laterality: N/A;   IVC FILTER INSERTION N/A 11/28/2023   Procedure: IVC FILTER INSERTION;  Surgeon: Jama Cordella MATSU, MD;  Location: [facility] INVASIVE CV LAB;  Service: Cardiovascular;  Laterality: N/A;   LOWER EXTREMITY ANGIOGRAPHY Left 11/24/2023   Procedure: Lower Extremity Angiography;  Surgeon: Jama Cordella MATSU, MD;  Location: [facility] INVASIVE CV LAB;  Service: Cardiovascular;  Laterality: Left;   TONSILLECTOMY     Patient Active Problem List   Diagnosis Date Noted   Lumbar spondylosis 03/08/2024   Complex regional pain syndrome type 2 of left lower extremity 03/08/2024   Atherosclerosis of native arteries of extremity with intermittent claudication 03/02/2024   Phantom limb syndrome with pain (HCC) 03/01/2024   Diverticulitis of colon 03/01/2024   Pain 02/13/2024   Hx of left BKA (HCC) 01/25/2024   Symptomatic anemia 12/01/2023   Multiple gastric ulcers 11/28/2023   Ischemic colitis 11/28/2023   Bloody stools 11/26/2023   Rectal bleeding 11/26/2023   Acute blood loss anemia  11/26/2023   Blister of left foot 11/24/2023   Limb ischemia 11/23/2023   Gangrene of left foot (HCC) 11/23/2023   Tobacco abuse 11/23/2023   PE (pulmonary thromboembolism) (HCC) 11/23/2023   GI bleeding 11/23/2023   Substance abuse (HCC) 11/23/2023   Leukocytosis 11/23/2023   Bipolar affective disorder, current episode manic (HCC) 01/06/2021   Brief psychotic disorder (HCC) 01/03/2021   Family history of  breast cancer    Carcinoma of upper-outer quadrant of right breast in female, estrogen receptor positive (HCC) 09/20/2018   Gastroesophageal reflux disease without esophagitis 01/04/2017   HTN (hypertension), benign 01/20/2014    ONSET DATE: 04/11/24  REFERRING DIAG: S10.487 (ICD-10-CM) - Hx of left BKA (HCC)   THERAPY DIAG:  Difficulty in walking, not elsewhere classified  Muscle weakness (generalized)  Rationale for Evaluation and Treatment: Rehabilitation  SUBJECTIVE:                                                                                                                                                                                             SUBJECTIVE STATEMENT:  Pt is seeing Garrel on Wednesday at [facility] to possibly getting an adjustment.  Otherwise pt is doing well.  PERTINENT HISTORY: Patient is a 53 year old female with acute limb ischemia s/p left BKA. PE in association with lower GI bleed s/p IVC filter.  PAIN:  Are you having pain? 6/10 back pain (8/10 earlier) thanks it may be her colitis.     PRECAUTIONS: None WEIGHT BEARING RESTRICTIONS: WBAT FALLS: Has patient fallen in last 6 months? Yes. Number of falls 2  LIVING ENVIRONMENT: Lives with: lives with their spouse Lives in: House/apartment Stairs: Ramped entrance Has following equipment at home: Environmental Consultant - 2 wheeled, Wheelchair (manual), and Architectural Technologist  PLOF: Independent  PATIENT GOALS: Get out of the wheelchair and be able to get outside and do yard work that needs to be done.  OBJECTIVE:  Note: Objective measures were completed at Evaluation unless otherwise noted.  DIAGNOSTIC FINDINGS:  EXAM: MRI LUMBAR SPINE WITHOUT CONTRAST  IMPRESSION: Generally mild for age lumbar spine degeneration. No spinal stenosis or convincing neural impingement.   However, small left-side annular fissures of the discs at L4-L5 and L5-S1, which might be a source for Left L5 and/or S1  radiculitis (respectively).   LOWER EXTREMITY MMT:    MMT Right Eval Left Eval  Hip flexion 4 5  Knee flexion 5 3+  Knee extension 5 3+  (Blank rows = not tested)  Progress note: assessed LCI-5 & 5xSTS d/t limited ambulation secondary to increased knee pain.    LCI-5:  Get up  from chair: 4 Pick up object from floor in standing: 0 Get up from floor: 3 Walk in house: 3 Walk outside on even ground: 0 Walk outside on uneven ground: 0 Walk outside in inclement weather: 0 Up stairs with rail: 4 Down stairs with rail: 4 Step up curb: 4 Step down curb: 4 Up stairs without rail: 0 Down stairs without rail: 0 Walk while carrying object: 3 TOTAL: 29                                                                                                                             TREATMENT DATE: 06/18/24  TherAct: To improve functional movements patterns for everyday tasks  The patient completed 10 minutes at level(s) 4-7 on the [facility] using both BUE/BLE reciprocal movements to promote strength, endurance, and cardiorespiratory fitness. PT increased the resistance level and monitored the patient's response to the intervention throughout. The patient required min cueing for technique and supervision level of assistance.    Gait:  Overground ambulation 160' with RW *Rest break* monitoring vitals Overground ambulation 160' with RW *Rest break* monitoring vitals Overground ambulation 320' with RW  TherEx:  Seated leg press 50#, 3x13    PATIENT EDUCATION: Education details: Pt educated on role of PT and services provided during current POC, along with prognosis and information about the clinic. Person educated: Patient and Spouse Education method: Explanation Education comprehension: verbalized understanding  HOME EXERCISE PROGRAM: Access Code: [email or web address] URL: [email or web address] Date: 05/14/2024 Prepared by: Peggye Linear  Exercises - Forward and  backward step Right leg   - 1 x daily - 7 x weekly - 3 sets - 15 reps - Standing Hip Abduction with Counter Support  - 1 x daily - 7 x weekly - 2 sets - 10 reps - Mini Squat with Counter Support  - 1 x daily - 7 x weekly - 2 sets - 10 reps - Seated Hamstring Stretch  - 2 x daily - 1 sets - 2 reps - 45sec hold - Seated Knee Extension Stretch with Chair  - 2 x daily - 1 sets - 2 reps - 45sec hold - Supine Knee Extension Stretch on Towel Roll  - 2 x daily - 1 sets - 2 reps - 90 hold  GOALS: Goals reviewed with patient? Yes  SHORT TERM GOALS: Target date: 05/30/2024  Pt will be independent with HEP in order to demonstrate increased ability to perform tasks related to occupation/hobbies. Baseline: Goal status: INITIAL  LONG TERM GOALS: Target date: 07/25/2024  1.  Patient (> 26 years old) will complete five times sit to stand test in < 15 seconds indicating an increased LE strength and improved balance. Baseline: 11.36 sec 06/11/24: 12.73 sec Goal status: INITIAL   2.  Patient will reduce timed up and go to <11 seconds to reduce fall risk and demonstrate improved transfer/gait ability. Baseline: 19.53 sec  10/29: 25.52 sec Goal status: INITIAL  3.  Patient  will increase 10 meter walk test to >1.92m/s as to improve gait speed for better community ambulation and to reduce fall risk. Baseline: TBD Goal status: INITIAL  4.  Patient will increase six minute walk test distance to >1000 for progression to community ambulator and improve gait ability. Baseline: 112 feet in 2 min and 8 sec   Goal status: INITIAL  5.  Pt will improve their Locomotor Capabilities Index (LCI-5) score by at least 7 points (MCID), demonstrating a clinically meaningful increase in ability to perform basic locomotor tasks with assistance and/or use of aids. Baseline: 06/11/24: 29 Goal Status: INITIAL  ASSESSMENT:  CLINICAL IMPRESSION:   Pt responded well to the exercises and was able to push herself towards  goals.  Pt does exhibit the medial heel whip when ambulating with the prosthetic and RW.  Pt also noted to have increased pain in the knee when ambulating on the second lap of the third and final attempt.  Pt will be getting adjustment from [facility] this week hopefully in order to improve overall gait.   Pt will continue to benefit from skilled therapy to address remaining deficits in order to improve overall QoL and return to PLOF.       OBJECTIVE IMPAIRMENTS: Abnormal gait, decreased activity tolerance, decreased balance, decreased endurance, decreased knowledge of use of DME, decreased mobility, difficulty walking, decreased ROM, decreased strength, prosthetic dependency , and pain.   ACTIVITY LIMITATIONS: carrying, lifting, bending, standing, squatting, stairs, transfers, bathing, toileting, dressing, hygiene/grooming, and caring for others  PARTICIPATION LIMITATIONS: meal prep, cleaning, laundry, driving, shopping, community activity, occupation, and yard work  PERSONAL FACTORS: Age, Behavior pattern, Fitness, Past/current experiences, Profession, Time since onset of injury/illness/exacerbation, and 3+ comorbidities: anemia, breast cancer, HTN, PE's are also affecting patient's functional outcome.   REHAB POTENTIAL: Good  CLINICAL DECISION MAKING: Evolving/moderate complexity  EVALUATION COMPLEXITY: Moderate  PLAN:  PT FREQUENCY: 2x/week PT DURATION: 12 weeks PLANNED INTERVENTIONS: 97750- Physical Performance Testing, 97110-Therapeutic exercises, 97530- Therapeutic activity, [id number]- Neuromuscular re-education, 97535- Self Care, [id number]- Manual therapy, [phone]- Gait training, [phone]- Orthotic/Prosthetic subsequent, Patient/Family education, Balance training, Stair training, Joint mobilization, and DME instructions  PLAN FOR NEXT SESSION:  Follow up regarding HEP   as appropriate based on knee pain.  Continue to advance interval training on Nustep, cautious with volume of overground AMB  until socket is refit   Fonda Simpers, PT, DPT Physical Therapist - [facility]  06/18/24, 4:20 PM

## 2024-06-20 ENCOUNTER — Ambulatory Visit

## 2024-06-20 DIAGNOSIS — R262 Difficulty in walking, not elsewhere classified: Secondary | ICD-10-CM

## 2024-06-20 DIAGNOSIS — M6281 Muscle weakness (generalized): Secondary | ICD-10-CM

## 2024-06-20 NOTE — Therapy (Signed)
 OUTPATIENT PHYSICAL THERAPY TREATMENT  Patient Name: Penny Chase MRN: [id number] DOB:02/21/1971, 53 y.o., female Today's Date: 06/20/2024  PCP: [facility] REFERRING PROVIDER: Delores Orvin BRAVO, NP  END OF SESSION:   PT End of Session - 06/20/24 1542     Visit Number 13    Number of Visits 25    Date for Recertification  07/25/24    Authorization Type Aetna    Authorization Time Period Aetna [facility]    Progress Note Due on Visit 10    PT Start Time 1535    PT Stop Time 1615    PT Time Calculation (min) 40 min    Equipment Utilized During Treatment Gait belt    Activity Tolerance Patient tolerated treatment well;No increased pain    Behavior During Therapy [facility] for tasks assessed/performed         Past Medical History:  Diagnosis Date   Acute lower limb ischemia    Anemia    Breast cancer (HCC)    Cancer (HCC) 09/2018   Right breast   COVID-19 11/2018   Family history of breast cancer    GERD (gastroesophageal reflux disease)    Hypertension    Personal history of radiation therapy 2020   Rt breast   Pulmonary embolism [facility])    Past Surgical History:  Procedure Laterality Date   AMPUTATION Left 11/26/2023   Procedure: AMPUTATION BELOW KNEE;  Surgeon: Jama Cordella MATSU, MD;  Location: [facility] ORS;  Service: Vascular;  Laterality: Left;   BREAST BIOPSY Right 09/12/2018   us  core/venus clip-INVASIVE MAMMARYLobular CARCINOMA, WITH LOBULAR FEATURES   BREAST LUMPECTOMY Right 10/04/2018   BREAST LUMPECTOMY WITH NEEDLE LOCALIZATION AND AXILLARY SENTINEL LYMPH NODE BX Right 10/04/2018   Procedure: BREAST LUMPECTOMY WITH NEEDLE LOCALIZATION AND AXILLARY SENTINEL LYMPH NODE BX;  Surgeon: Rodolph Romano, MD;  Location: [facility] ORS;  Service: General;  Laterality: Right;   COLONOSCOPY N/A 11/28/2023   Procedure: COLONOSCOPY;  Surgeon: Jinny Carmine, MD;  Location: [facility] ENDOSCOPY;  Service: Endoscopy;  Laterality: N/A;   ESOPHAGOGASTRODUODENOSCOPY N/A 11/28/2023    Procedure: EGD (ESOPHAGOGASTRODUODENOSCOPY);  Surgeon: Jinny Carmine, MD;  Location: [facility] ENDOSCOPY;  Service: Endoscopy;  Laterality: N/A;   ESOPHAGOGASTRODUODENOSCOPY N/A 04/02/2024   Procedure: EGD (ESOPHAGOGASTRODUODENOSCOPY);  Surgeon: Therisa Bi, MD;  Location: [facility] ENDOSCOPY;  Service: Gastroenterology;  Laterality: N/A;   GIVENS CAPSULE STUDY N/A 11/28/2023   Procedure: IMAGING PROCEDURE, GI TRACT, INTRALUMINAL, VIA CAPSULE;  Surgeon: Jinny Carmine, MD;  Location: [facility] ENDOSCOPY;  Service: Endoscopy;  Laterality: N/A;   IVC FILTER INSERTION N/A 11/28/2023   Procedure: IVC FILTER INSERTION;  Surgeon: Jama Cordella MATSU, MD;  Location: [facility] INVASIVE CV LAB;  Service: Cardiovascular;  Laterality: N/A;   LOWER EXTREMITY ANGIOGRAPHY Left 11/24/2023   Procedure: Lower Extremity Angiography;  Surgeon: Jama Cordella MATSU, MD;  Location: [facility] INVASIVE CV LAB;  Service: Cardiovascular;  Laterality: Left;   TONSILLECTOMY     Patient Active Problem List   Diagnosis Date Noted   Lumbar spondylosis 03/08/2024   Complex regional pain syndrome type 2 of left lower extremity 03/08/2024   Atherosclerosis of native arteries of extremity with intermittent claudication 03/02/2024   Phantom limb syndrome with pain (HCC) 03/01/2024   Diverticulitis of colon 03/01/2024   Pain 02/13/2024   Hx of left BKA (HCC) 01/25/2024   Symptomatic anemia 12/01/2023   Multiple gastric ulcers 11/28/2023   Ischemic colitis 11/28/2023   Bloody stools 11/26/2023   Rectal bleeding 11/26/2023   Acute blood loss anemia 11/26/2023  Blister of left foot 11/24/2023   Limb ischemia 11/23/2023   Gangrene of left foot (HCC) 11/23/2023   Tobacco abuse 11/23/2023   PE (pulmonary thromboembolism) (HCC) 11/23/2023   GI bleeding 11/23/2023   Substance abuse (HCC) 11/23/2023   Leukocytosis 11/23/2023   Bipolar affective disorder, current episode manic (HCC) 01/06/2021   Brief psychotic disorder (HCC) 01/03/2021   Family history of  breast cancer    Carcinoma of upper-outer quadrant of right breast in female, estrogen receptor positive (HCC) 09/20/2018   Gastroesophageal reflux disease without esophagitis 01/04/2017   HTN (hypertension), benign 01/20/2014    ONSET DATE: 04/11/24  REFERRING DIAG: S10.487 (ICD-10-CM) - Hx of left BKA (HCC)   THERAPY DIAG:  Difficulty in walking, not elsewhere classified  Muscle weakness (generalized)  Rationale for Evaluation and Treatment: Rehabilitation  SUBJECTIVE:                                                                                                                                                                                             SUBJECTIVE STATEMENT: Pt arrives direction from [facility] clinic appointment, Garrel didn't mention tibial pressure issues as relayed by dino via telephne 2 weeks prior. Garrel also indicated the medial whip would likely persist. Pt's knee remains tight, swollen, and painful. Low back pain is aggravated from pt moving WC to/from trunk of car earlier.   PERTINENT HISTORY: Patient is a 53 year old female with acute limb ischemia s/p left BKA. PE in association with lower GI bleed s/p IVC filter.  PAIN:  Are you having pain? 6/10 back pain (8/10 earlier) thanks it may be her colitis.     PRECAUTIONS: None WEIGHT BEARING RESTRICTIONS: WBAT FALLS: Has patient fallen in last 6 months? Yes. Number of falls 2  LIVING ENVIRONMENT: Lives with: lives with their spouse Lives in: House/apartment Stairs: Ramped entrance Has following equipment at home: Environmental Consultant - 2 wheeled, Wheelchair (manual), and Architectural Technologist  PLOF: Independent  PATIENT GOALS: Get out of the wheelchair and be able to get outside and do yard work that needs to be done.  OBJECTIVE:  Note: Objective measures were completed at Evaluation unless otherwise noted.  DIAGNOSTIC FINDINGS:  EXAM: MRI LUMBAR SPINE WITHOUT CONTRAST  IMPRESSION: Generally mild for age lumbar spine  degeneration. No spinal stenosis or convincing neural impingement.   However, small left-side annular fissures of the discs at L4-L5 and L5-S1, which might be a source for Left L5 and/or S1 radiculitis (respectively).   LOWER EXTREMITY MMT:    MMT Right Eval Left Eval  Hip flexion 4 5  Knee flexion 5 3+  Knee extension  5 3+  (Blank rows = not tested)                                                                                                                             TREATMENT DATE: 06/20/24 -Review of recent prosthetist visit immediately prior to arrival  -AMB overground with RW 22m20sec (-pain limited in low back, and knee)  -Left hamstrings stretch, foot elevated (seated) 1x60sec  -LLE seated LAQ 1x15x3secH (no prosthesis)  -Standing LLE hip extension SLR in forward trunk lean on RW 1x15 -LLE seated LAQ 1x15x3secH (no prosthesis)  -Standing LLE hip extension SLR in forward trunk lean on RW 1x15 -4 minutes on Nustewp level 2: HR126, SpO2 95%; (2 minute recovery HR 104bpm)    PATIENT EDUCATION: Education details: Pt educated on role of PT and services provided during current POC, along with prognosis and information about the clinic. Person educated: Patient and Spouse Education method: Explanation Education comprehension: verbalized understanding  HOME EXERCISE PROGRAM: Access Code: [email or web address] URL: [email or web address] Date: 05/14/2024 Prepared by: Peggye Linear  Exercises - Forward and backward step Right leg   - 1 x daily - 7 x weekly - 3 sets - 15 reps - Standing Hip Abduction with Counter Support  - 1 x daily - 7 x weekly - 2 sets - 10 reps - Mini Squat with Counter Support  - 1 x daily - 7 x weekly - 2 sets - 10 reps - Seated Hamstring Stretch  - 2 x daily - 1 sets - 2 reps - 45sec hold - Seated Knee Extension Stretch with Chair  - 2 x daily - 1 sets - 2 reps - 45sec hold - Supine Knee Extension Stretch on Towel Roll  - 2 x daily - 1 sets - 2 reps  - 90 hold  GOALS: Goals reviewed with patient? Yes  SHORT TERM GOALS: Target date: 05/30/2024  Pt will be independent with HEP in order to demonstrate increased ability to perform tasks related to occupation/hobbies. Baseline: Goal status: INITIAL  LONG TERM GOALS: Target date: 07/25/2024  1.  Patient (> 27 years old) will complete five times sit to stand test in < 15 seconds indicating an increased LE strength and improved balance. Baseline: 11.36 sec 06/11/24: 12.73 sec Goal status: INITIAL   2.  Patient will reduce timed up and go to <11 seconds to reduce fall risk and demonstrate improved transfer/gait ability. Baseline: 19.53 sec  10/29: 25.52 sec Goal status: INITIAL  3.  Patient will increase 10 meter walk test to >1.3m/s as to improve gait speed for better community ambulation and to reduce fall risk. Baseline: TBD Goal status: INITIAL  4.  Patient will increase six minute walk test distance to >1000 for progression to community ambulator and improve gait ability. Baseline: 112 feet in 2 min and 8 sec  Goal status: INITIAL  5.  Pt will improve their Locomotor Capabilities Index (LCI-5) score by at least 7 points (MCID),  demonstrating a clinically meaningful increase in ability to perform basic locomotor tasks with assistance and/or use of aids. Baseline: 06/11/24: 29 Goal Status: INITIAL  ASSESSMENT:  CLINICAL IMPRESSION:  Appears prosthetist did not get author's message as he did not address area on distal tibia that warranted inspection and has been painful and exposed to high pressures in socket. Pt reports there was a pilon adjustment, however prosthetist reported said no adjustments to hardware could effectively reduce the pt's medial whip. Pt remains fairly limited today from focal knee edema, even her hamstrings stretches are painful, now about the patella, whereas 2 weeks prior she had most of her TKE stretching pain in the cubital fossa. Pt also having more  acute on chronic back pain today which limited tolerance with many activities. Repeated a short moderate intensity interval this date to compare to 2 weeks prior, pt showing signs of improved cardiovascular fitness AEB higher watts, lower RPE, and lower 2 minute recovery HR. Its more evident today that pt still has generalized tightness in the hip which is likely also affecting quality of gait, will plan to reassess hip ROM formally next visit and update HEP to include ROM regimen. Will need to contact Garrel at [facility] to relay pressure concerns once again. Pt will continue to benefit from skilled therapy to address remaining deficits in order to improve overall QoL and return to PLOF.       OBJECTIVE IMPAIRMENTS: Abnormal gait, decreased activity tolerance, decreased balance, decreased endurance, decreased knowledge of use of DME, decreased mobility, difficulty walking, decreased ROM, decreased strength, prosthetic dependency , and pain.   ACTIVITY LIMITATIONS: carrying, lifting, bending, standing, squatting, stairs, transfers, bathing, toileting, dressing, hygiene/grooming, and caring for others  PARTICIPATION LIMITATIONS: meal prep, cleaning, laundry, driving, shopping, community activity, occupation, and yard work  PERSONAL FACTORS: Age, Behavior pattern, Fitness, Past/current experiences, Profession, Time since onset of injury/illness/exacerbation, and 3+ comorbidities: anemia, breast cancer, HTN, PE's are also affecting patient's functional outcome.   REHAB POTENTIAL: Good  CLINICAL DECISION MAKING: Evolving/moderate complexity  EVALUATION COMPLEXITY: Moderate  PLAN:  PT FREQUENCY: 2x/week PT DURATION: 12 weeks PLANNED INTERVENTIONS: 97750- Physical Performance Testing, 97110-Therapeutic exercises, 97530- Therapeutic activity, 97112- Neuromuscular re-education, 97535- Self Care, 02859- Manual therapy, [phone]- Gait training, [phone]- Orthotic/Prosthetic subsequent, Patient/Family education,  Balance training, Stair training, Joint mobilization, and DME instructions  PLAN FOR NEXT SESSION:  Continue to advance HIT training on Nustep for cardio fitness Limit overground AMB progression Formal RLE hip. ROM assessment HEP update with stretching routine Interventions to improve Knee load tolerance and low back activity tolerance.  Retest Left knee MMT  3:53 PM, 06/20/24 Peggye JAYSON Linear, PT, DPT Physical Therapist - [place] [facility]  Outpatient Physical Therapy- Main Campus [phone]

## 2024-06-22 ENCOUNTER — Ambulatory Visit: Admitting: Physical Therapy

## 2024-06-25 ENCOUNTER — Ambulatory Visit

## 2024-06-25 DIAGNOSIS — M6281 Muscle weakness (generalized): Secondary | ICD-10-CM

## 2024-06-25 DIAGNOSIS — R262 Difficulty in walking, not elsewhere classified: Secondary | ICD-10-CM

## 2024-06-25 NOTE — Therapy (Signed)
 OUTPATIENT PHYSICAL THERAPY TREATMENT  Patient Name: Penny Chase MRN: [id number] DOB:[birth year], 53 y.o., female Today's Date: 06/25/2024  PCP: [facility] REFERRING PROVIDER: Delores Orvin BRAVO, NP  END OF SESSION:   PT End of Session - 06/25/24 1609     Visit Number 14    Number of Visits 25    Date for Recertification  07/25/24    Authorization Type Aetna    Authorization Time Period Aetna [facility]    Progress Note Due on Visit 10    PT Start Time 1620    PT Stop Time 1700    PT Time Calculation (min) 40 min    Equipment Utilized During Treatment Gait belt    Activity Tolerance Patient tolerated treatment well;No increased pain    Behavior During Therapy [facility] for tasks assessed/performed         Past Medical History:  Diagnosis Date   Acute lower limb ischemia    Anemia    Breast cancer (HCC)    Cancer (HCC) 09/2018   Right breast   COVID-19 11/2018   Family history of breast cancer    GERD (gastroesophageal reflux disease)    Hypertension    Personal history of radiation therapy 2020   Rt breast   Pulmonary embolism [facility])    Past Surgical History:  Procedure Laterality Date   AMPUTATION Left 11/26/2023   Procedure: AMPUTATION BELOW KNEE;  Surgeon: Jama Cordella MATSU, MD;  Location: [facility] ORS;  Service: Vascular;  Laterality: Left;   BREAST BIOPSY Right 09/12/2018   us  core/venus clip-INVASIVE MAMMARYLobular CARCINOMA, WITH LOBULAR FEATURES   BREAST LUMPECTOMY Right 10/04/2018   BREAST LUMPECTOMY WITH NEEDLE LOCALIZATION AND AXILLARY SENTINEL LYMPH NODE BX Right 10/04/2018   Procedure: BREAST LUMPECTOMY WITH NEEDLE LOCALIZATION AND AXILLARY SENTINEL LYMPH NODE BX;  Surgeon: Rodolph Romano, MD;  Location: [facility] ORS;  Service: General;  Laterality: Right;   COLONOSCOPY N/A 11/28/2023   Procedure: COLONOSCOPY;  Surgeon: Jinny Carmine, MD;  Location: [facility] ENDOSCOPY;  Service: Endoscopy;  Laterality: N/A;   ESOPHAGOGASTRODUODENOSCOPY N/A 11/28/2023    Procedure: EGD (ESOPHAGOGASTRODUODENOSCOPY);  Surgeon: Jinny Carmine, MD;  Location: [facility] ENDOSCOPY;  Service: Endoscopy;  Laterality: N/A;   ESOPHAGOGASTRODUODENOSCOPY N/A 04/02/2024   Procedure: EGD (ESOPHAGOGASTRODUODENOSCOPY);  Surgeon: Therisa Bi, MD;  Location: [facility] ENDOSCOPY;  Service: Gastroenterology;  Laterality: N/A;   GIVENS CAPSULE STUDY N/A 11/28/2023   Procedure: IMAGING PROCEDURE, GI TRACT, INTRALUMINAL, VIA CAPSULE;  Surgeon: Jinny Carmine, MD;  Location: [facility] ENDOSCOPY;  Service: Endoscopy;  Laterality: N/A;   IVC FILTER INSERTION N/A 11/28/2023   Procedure: IVC FILTER INSERTION;  Surgeon: Jama Cordella MATSU, MD;  Location: [facility] INVASIVE CV LAB;  Service: Cardiovascular;  Laterality: N/A;   LOWER EXTREMITY ANGIOGRAPHY Left 11/24/2023   Procedure: Lower Extremity Angiography;  Surgeon: Jama Cordella MATSU, MD;  Location: [facility] INVASIVE CV LAB;  Service: Cardiovascular;  Laterality: Left;   TONSILLECTOMY     Patient Active Problem List   Diagnosis Date Noted   Lumbar spondylosis 03/08/2024   Complex regional pain syndrome type 2 of left lower extremity 03/08/2024   Atherosclerosis of native arteries of extremity with intermittent claudication 03/02/2024   Phantom limb syndrome with pain (HCC) 03/01/2024   Diverticulitis of colon 03/01/2024   Pain 02/13/2024   Hx of left BKA (HCC) 01/25/2024   Symptomatic anemia 12/01/2023   Multiple gastric ulcers 11/28/2023   Ischemic colitis 11/28/2023   Bloody stools 11/26/2023   Rectal bleeding 11/26/2023   Acute blood loss anemia 11/26/2023  Blister of left foot 11/24/2023   Limb ischemia 11/23/2023   Gangrene of left foot (HCC) 11/23/2023   Tobacco abuse 11/23/2023   PE (pulmonary thromboembolism) (HCC) 11/23/2023   GI bleeding 11/23/2023   Substance abuse (HCC) 11/23/2023   Leukocytosis 11/23/2023   Bipolar affective disorder, current episode manic (HCC) 01/06/2021   Brief psychotic disorder (HCC) 01/03/2021   Family history of  breast cancer    Carcinoma of upper-outer quadrant of right breast in female, estrogen receptor positive (HCC) 09/20/2018   Gastroesophageal reflux disease without esophagitis 01/04/2017   HTN (hypertension), benign 01/20/2014    ONSET DATE: 04/11/24  REFERRING DIAG: S10.487 (ICD-10-CM) - Hx of left BKA (HCC)   THERAPY DIAG:  Difficulty in walking, not elsewhere classified  Muscle weakness (generalized)  Rationale for Evaluation and Treatment: Rehabilitation  SUBJECTIVE:                                                                                                                                                                                             SUBJECTIVE STATEMENT:  Pt reports the pain is about the same, 5-6/10 pain upon arrival.  Pt notes that her back is having some issues as well as the nerves in the phantom limb.    PERTINENT HISTORY: Patient is a 53 year old female with acute limb ischemia s/p left BKA. PE in association with lower GI bleed s/p IVC filter.  PAIN:  Are you having pain? 6/10 back pain (8/10 earlier) thanks it may be her colitis.     PRECAUTIONS: None WEIGHT BEARING RESTRICTIONS: WBAT FALLS: Has patient fallen in last 6 months? Yes. Number of falls 2  LIVING ENVIRONMENT: Lives with: lives with their spouse Lives in: House/apartment Stairs: Ramped entrance Has following equipment at home: Environmental Consultant - 2 wheeled, Wheelchair (manual), and Architectural Technologist  PLOF: Independent  PATIENT GOALS: Get out of the wheelchair and be able to get outside and do yard work that needs to be done.  OBJECTIVE:  Note: Objective measures were completed at Evaluation unless otherwise noted.  DIAGNOSTIC FINDINGS:  EXAM: MRI LUMBAR SPINE WITHOUT CONTRAST  IMPRESSION: Generally mild for age lumbar spine degeneration. No spinal stenosis or convincing neural impingement.   However, small left-side annular fissures of the discs at L4-L5 and L5-S1, which might be a source  for Left L5 and/or S1 radiculitis (respectively).   LOWER EXTREMITY MMT:    MMT Right Eval Left Eval  Hip flexion 4 5  Knee flexion 5 3+  Knee extension 5 3+  (Blank rows = not tested)  TREATMENT DATE: 06/25/24   TherAct: To improve functional movements patterns for everyday tasks  Seated hamstring stretch without prosthetic for reduction of painful symptoms she was experiencing with it donned, 60 sec bouts x4 (L LE elevated on rolling chair lifted to highest position)  Sidelying clamshells, 2x10 each LE Sidelying reverse clamshells, 2x10 each LE  Prone hip extensions with bent knee, x15 each LE Prone hip extensions with straight knee, x15 each LE  Hooklying LTR, 2x10 Hooklying bridges, 2x10   PATIENT EDUCATION: Education details: Pt educated on role of PT and services provided during current POC, along with prognosis and information about the clinic. Person educated: Patient and Spouse Education method: Explanation Education comprehension: verbalized understanding  HOME EXERCISE PROGRAM: Access Code: [email or web address] URL: [email or web address] Date: 05/14/2024 Prepared by: Peggye Linear  Exercises - Forward and backward step Right leg   - 1 x daily - 7 x weekly - 3 sets - 15 reps - Standing Hip Abduction with Counter Support  - 1 x daily - 7 x weekly - 2 sets - 10 reps - Mini Squat with Counter Support  - 1 x daily - 7 x weekly - 2 sets - 10 reps - Seated Hamstring Stretch  - 2 x daily - 1 sets - 2 reps - 45sec hold - Seated Knee Extension Stretch with Chair  - 2 x daily - 1 sets - 2 reps - 45sec hold - Supine Knee Extension Stretch on Towel Roll  - 2 x daily - 1 sets - 2 reps - 90 hold  GOALS: Goals reviewed with patient? Yes  SHORT TERM GOALS: Target date: 05/30/2024  Pt will be independent with HEP in order to demonstrate increased  ability to perform tasks related to occupation/hobbies. Baseline: Goal status: INITIAL  LONG TERM GOALS: Target date: 07/25/2024  1.  Patient (> 74 years old) will complete five times sit to stand test in < 15 seconds indicating an increased LE strength and improved balance. Baseline: 11.36 sec 06/11/24: 12.73 sec Goal status: INITIAL   2.  Patient will reduce timed up and go to <11 seconds to reduce fall risk and demonstrate improved transfer/gait ability. Baseline: 19.53 sec  10/29: 25.52 sec Goal status: INITIAL  3.  Patient will increase 10 meter walk test to >1.31m/s as to improve gait speed for better community ambulation and to reduce fall risk. Baseline: TBD Goal status: INITIAL  4.  Patient will increase six minute walk test distance to >1000 for progression to community ambulator and improve gait ability. Baseline: 112 feet in 2 min and 8 sec  Goal status: INITIAL  5.  Pt will improve their Locomotor Capabilities Index (LCI-5) score by at least 7 points (MCID), demonstrating a clinically meaningful increase in ability to perform basic locomotor tasks with assistance and/or use of aids. Baseline: 06/11/24: 29 Goal Status: INITIAL  ASSESSMENT:  CLINICAL IMPRESSION:   Pt performed bed-level exercises against gravity in order to improve overall tolerance to exercise and to movement of the LE limb.  Pt tolerated the exercises well and was able to perform with minimal complaints in regards to current pain in the hips and and the lower back.  Pt ultimately performed well with the exercises and will continue to benefit from LE strengthening as pt is still having difficulty with the correct fitment of her prosthetic LE.   Pt will continue to benefit from skilled therapy to address remaining deficits in order to improve overall QoL and return to PLOF.  OBJECTIVE IMPAIRMENTS: Abnormal gait, decreased activity tolerance, decreased balance, decreased endurance, decreased  knowledge of use of DME, decreased mobility, difficulty walking, decreased ROM, decreased strength, prosthetic dependency , and pain.   ACTIVITY LIMITATIONS: carrying, lifting, bending, standing, squatting, stairs, transfers, bathing, toileting, dressing, hygiene/grooming, and caring for others  PARTICIPATION LIMITATIONS: meal prep, cleaning, laundry, driving, shopping, community activity, occupation, and yard work  PERSONAL FACTORS: Age, Behavior pattern, Fitness, Past/current experiences, Profession, Time since onset of injury/illness/exacerbation, and 3+ comorbidities: anemia, breast cancer, HTN, PE's are also affecting patient's functional outcome.   REHAB POTENTIAL: Good  CLINICAL DECISION MAKING: Evolving/moderate complexity  EVALUATION COMPLEXITY: Moderate  PLAN:  PT FREQUENCY: 2x/week PT DURATION: 12 weeks PLANNED INTERVENTIONS: 97750- Physical Performance Testing, 97110-Therapeutic exercises, 97530- Therapeutic activity, 97112- Neuromuscular re-education, 97535- Self Care, 02859- Manual therapy, [phone]- Gait training, [phone]- Orthotic/Prosthetic subsequent, Patient/Family education, Balance training, Stair training, Joint mobilization, and DME instructions  PLAN FOR NEXT SESSION:  Continue to advance HIT training on Nustep for cardio fitness Limit overground AMB progression Formal RLE hip. ROM assessment HEP update with stretching routine Interventions to improve Knee load tolerance and low back activity tolerance.  Retest Left knee MMT   Fonda Simpers, PT, DPT Physical Therapist - [facility]  06/25/24, 6:02 PM

## 2024-06-27 ENCOUNTER — Telehealth: Payer: Self-pay

## 2024-06-27 ENCOUNTER — Ambulatory Visit: Admitting: Physical Therapy

## 2024-06-27 NOTE — Telephone Encounter (Signed)
 Spoke to [employer] at [facility]- expressed author's concerns regarding distal anterior tibia exposure in socket and excessive pressure in weightbearing, intolerance, pain. Garrel not aware of issue in specific, was not discussed at last appointment, he did not get this part of author's message. Garrel reports they will call and schedule patient to come in and have adjustment made.   8:19 AM, 06/27/24 Peggye JAYSON Linear, PT, DPT Physical Therapist - [facility]  Outpatient Physical Therapy- Main Campus [phone]

## 2024-06-28 ENCOUNTER — Ambulatory Visit: Admitting: Physical Therapy

## 2024-06-28 DIAGNOSIS — M6281 Muscle weakness (generalized): Secondary | ICD-10-CM

## 2024-06-28 DIAGNOSIS — R262 Difficulty in walking, not elsewhere classified: Secondary | ICD-10-CM | POA: Diagnosis not present

## 2024-06-28 NOTE — Therapy (Signed)
 OUTPATIENT PHYSICAL THERAPY TREATMENT  Patient Name: Penny Chase MRN: [id number] DOB:1971-02-09, 53 y.o., female Today's Date: 06/28/2024  PCP: [facility] REFERRING PROVIDER: Delores Orvin BRAVO, NP  END OF SESSION:   PT End of Session - 06/28/24 1627     Visit Number 15    Number of Visits 25    Date for Recertification  07/25/24    Authorization Type Aetna    Authorization Time Period Aetna [facility]    Progress Note Due on Visit 20    PT Start Time 1615    PT Stop Time 1655    PT Time Calculation (min) 40 min    Equipment Utilized During Treatment Gait belt    Activity Tolerance Patient tolerated treatment well;No increased pain    Behavior During Therapy [facility] for tasks assessed/performed          Past Medical History:  Diagnosis Date   Acute lower limb ischemia    Anemia    Breast cancer (HCC)    Cancer (HCC) 09/2018   Right breast   COVID-19 11/2018   Family history of breast cancer    GERD (gastroesophageal reflux disease)    Hypertension    Personal history of radiation therapy 2020   Rt breast   Pulmonary embolism [facility])    Past Surgical History:  Procedure Laterality Date   AMPUTATION Left 11/26/2023   Procedure: AMPUTATION BELOW KNEE;  Surgeon: Jama Cordella MATSU, MD;  Location: [facility] ORS;  Service: Vascular;  Laterality: Left;   BREAST BIOPSY Right 09/12/2018   us  core/venus clip-INVASIVE MAMMARYLobular CARCINOMA, WITH LOBULAR FEATURES   BREAST LUMPECTOMY Right 10/04/2018   BREAST LUMPECTOMY WITH NEEDLE LOCALIZATION AND AXILLARY SENTINEL LYMPH NODE BX Right 10/04/2018   Procedure: BREAST LUMPECTOMY WITH NEEDLE LOCALIZATION AND AXILLARY SENTINEL LYMPH NODE BX;  Surgeon: Rodolph Romano, MD;  Location: [facility] ORS;  Service: General;  Laterality: Right;   COLONOSCOPY N/A 11/28/2023   Procedure: COLONOSCOPY;  Surgeon: Jinny Carmine, MD;  Location: [facility] ENDOSCOPY;  Service: Endoscopy;  Laterality: N/A;   ESOPHAGOGASTRODUODENOSCOPY N/A  11/28/2023   Procedure: EGD (ESOPHAGOGASTRODUODENOSCOPY);  Surgeon: Jinny Carmine, MD;  Location: [facility] ENDOSCOPY;  Service: Endoscopy;  Laterality: N/A;   ESOPHAGOGASTRODUODENOSCOPY N/A 04/02/2024   Procedure: EGD (ESOPHAGOGASTRODUODENOSCOPY);  Surgeon: Therisa Bi, MD;  Location: [facility] ENDOSCOPY;  Service: Gastroenterology;  Laterality: N/A;   GIVENS CAPSULE STUDY N/A 11/28/2023   Procedure: IMAGING PROCEDURE, GI TRACT, INTRALUMINAL, VIA CAPSULE;  Surgeon: Jinny Carmine, MD;  Location: [facility] ENDOSCOPY;  Service: Endoscopy;  Laterality: N/A;   IVC FILTER INSERTION N/A 11/28/2023   Procedure: IVC FILTER INSERTION;  Surgeon: Jama Cordella MATSU, MD;  Location: [facility] INVASIVE CV LAB;  Service: Cardiovascular;  Laterality: N/A;   LOWER EXTREMITY ANGIOGRAPHY Left 11/24/2023   Procedure: Lower Extremity Angiography;  Surgeon: Jama Cordella MATSU, MD;  Location: [facility] INVASIVE CV LAB;  Service: Cardiovascular;  Laterality: Left;   TONSILLECTOMY     Patient Active Problem List   Diagnosis Date Noted   Lumbar spondylosis 03/08/2024   Complex regional pain syndrome type 2 of left lower extremity 03/08/2024   Atherosclerosis of native arteries of extremity with intermittent claudication 03/02/2024   Phantom limb syndrome with pain (HCC) 03/01/2024   Diverticulitis of colon 03/01/2024   Pain 02/13/2024   Hx of left BKA (HCC) 01/25/2024   Symptomatic anemia 12/01/2023   Multiple gastric ulcers 11/28/2023   Ischemic colitis 11/28/2023   Bloody stools 11/26/2023   Rectal bleeding 11/26/2023   Acute blood loss anemia  11/26/2023   Blister of left foot 11/24/2023   Limb ischemia 11/23/2023   Gangrene of left foot (HCC) 11/23/2023   Tobacco abuse 11/23/2023   PE (pulmonary thromboembolism) (HCC) 11/23/2023   GI bleeding 11/23/2023   Substance abuse (HCC) 11/23/2023   Leukocytosis 11/23/2023   Bipolar affective disorder, current episode manic (HCC) 01/06/2021   Brief psychotic disorder (HCC) 01/03/2021   Family  history of breast cancer    Carcinoma of upper-outer quadrant of right breast in female, estrogen receptor positive (HCC) 09/20/2018   Gastroesophageal reflux disease without esophagitis 01/04/2017   HTN (hypertension), benign 01/20/2014    ONSET DATE: 04/11/24  REFERRING DIAG: S10.487 (ICD-10-CM) - Hx of left BKA (HCC)   THERAPY DIAG:  Difficulty in walking, not elsewhere classified  Muscle weakness (generalized)  Rationale for Evaluation and Treatment: Rehabilitation  SUBJECTIVE:                                                                                                                                                                                             SUBJECTIVE STATEMENT:  Pt reports improved pain in prosthesis since adjustment yesterday, has been walking and doing lots of standing today with good results.    PERTINENT HISTORY: Patient is a 53 year old female with acute limb ischemia s/p left BKA. PE in association with lower GI bleed s/p IVC filter.  PAIN:  Are you having pain? 6/10 back pain (8/10 earlier) thanks it may be her colitis.     PRECAUTIONS: None WEIGHT BEARING RESTRICTIONS: WBAT FALLS: Has patient fallen in last 6 months? Yes. Number of falls 2  LIVING ENVIRONMENT: Lives with: lives with their spouse Lives in: House/apartment Stairs: Ramped entrance Has following equipment at home: Environmental Consultant - 2 wheeled, Wheelchair (manual), and Architectural Technologist  PLOF: Independent  PATIENT GOALS: Get out of the wheelchair and be able to get outside and do yard work that needs to be done.  OBJECTIVE:  Note: Objective measures were completed at Evaluation unless otherwise noted.  DIAGNOSTIC FINDINGS:  EXAM: MRI LUMBAR SPINE WITHOUT CONTRAST  IMPRESSION: Generally mild for age lumbar spine degeneration. No spinal stenosis or convincing neural impingement.   However, small left-side annular fissures of the discs at L4-L5 and L5-S1, which might be a source for  Left L5 and/or S1 radiculitis (respectively).   LOWER EXTREMITY MMT:    MMT Right Eval Left Eval  Hip flexion 4 5  Knee flexion 5 3+  Knee extension 5 3+  (Blank rows = not tested)  TREATMENT DATE: 06/28/24    TA- To improve functional movements patterns for everyday tasks   Nustep B UE and LE reciprocal movement training x 8 min rolling hills level 4-8  Gait training - activities focussed on specific components of gait cycle and multimodal cueing for completion  Gait with 4WW 150 ft in 1 min then 50 sec, 1 ant loss of balance. Some discomfort on LLE with gait with 4WW.   TA- To improve functional movements patterns for everyday tasks   Donned lite gait harness - LiteGait harness applied with patient in standing position. Straps secured snugly around torso and legs per manufacturer guidelines. Checked for proper alignment, comfort, and safety prior to standing and gait training. All buckles and fasteners verified secure; weight-bearing support adjusted to patient tolerance  Gait training - activities focussed on specific components of gait cycle and multimodal cueing for completion  Gait training on lite gait - used lite gait to take pressure from LE with decreased WB and distraction force.  2 x 4 min at 1 MPH, some assistance and cues for end range extension - reports improved comfort with this strategy   TA- To improve functional movements patterns for everyday tasks   Doffed lite gait harness- LiteGait harness removed with patient in standing position. Due to increased abdominal pressure from harness, pt observed for dizziness post removal before transitioning to sitting for recovery.    PATIENT EDUCATION: Education details: Pt educated on role of PT and services provided during current POC, along with prognosis and information about the clinic. Person  educated: Patient and Spouse Education method: Explanation Education comprehension: verbalized understanding  HOME EXERCISE PROGRAM: Access Code: [email or web address] URL: [email or web address] Date: 05/14/2024 Prepared by: Peggye Linear  Exercises - Forward and backward step Right leg   - 1 x daily - 7 x weekly - 3 sets - 15 reps - Standing Hip Abduction with Counter Support  - 1 x daily - 7 x weekly - 2 sets - 10 reps - Mini Squat with Counter Support  - 1 x daily - 7 x weekly - 2 sets - 10 reps - Seated Hamstring Stretch  - 2 x daily - 1 sets - 2 reps - 45sec hold - Seated Knee Extension Stretch with Chair  - 2 x daily - 1 sets - 2 reps - 45sec hold - Supine Knee Extension Stretch on Towel Roll  - 2 x daily - 1 sets - 2 reps - 90 hold  GOALS: Goals reviewed with patient? Yes  SHORT TERM GOALS: Target date: 05/30/2024  Pt will be independent with HEP in order to demonstrate increased ability to perform tasks related to occupation/hobbies. Baseline: Goal status: INITIAL  LONG TERM GOALS: Target date: 07/25/2024  1.  Patient (> 27 years old) will complete five times sit to stand test in < 15 seconds indicating an increased LE strength and improved balance. Baseline: 11.36 sec 06/11/24: 12.73 sec Goal status: INITIAL   2.  Patient will reduce timed up and go to <11 seconds to reduce fall risk and demonstrate improved transfer/gait ability. Baseline: 19.53 sec  10/29: 25.52 sec Goal status: INITIAL  3.  Patient will increase 10 meter walk test to >1.72m/s as to improve gait speed for better community ambulation and to reduce fall risk. Baseline: TBD Goal status: INITIAL  4.  Patient will increase six minute walk test distance to >1000 for progression to community ambulator and improve gait ability. Baseline: 112 feet in 2 min and 8 sec  Goal status: INITIAL  5.  Pt will improve their Locomotor Capabilities Index (LCI-5) score by at least 7 points (MCID), demonstrating a  clinically meaningful increase in ability to perform basic locomotor tasks with assistance and/or use of aids. Baseline: 06/11/24: 29 Goal Status: INITIAL  ASSESSMENT:  CLINICAL IMPRESSION:   Patient arrived with good motivation for completion of pt activities.  Patient reports improved comfort in her left lower extremity after processes made adjustment.  Patient trialed on light gait this session in order to off weight her lower extremity had some decreased weightbearing utilizing like gait.  Patient reports significant improvement in hip pain as well as improvement in residual limb comfort. Pt will continue to benefit from skilled physical therapy intervention to address impairments, improve QOL, and attain therapy goals.        OBJECTIVE IMPAIRMENTS: Abnormal gait, decreased activity tolerance, decreased balance, decreased endurance, decreased knowledge of use of DME, decreased mobility, difficulty walking, decreased ROM, decreased strength, prosthetic dependency , and pain.   ACTIVITY LIMITATIONS: carrying, lifting, bending, standing, squatting, stairs, transfers, bathing, toileting, dressing, hygiene/grooming, and caring for others  PARTICIPATION LIMITATIONS: meal prep, cleaning, laundry, driving, shopping, community activity, occupation, and yard work  PERSONAL FACTORS: Age, Behavior pattern, Fitness, Past/current experiences, Profession, Time since onset of injury/illness/exacerbation, and 3+ comorbidities: anemia, breast cancer, HTN, PE's are also affecting patient's functional outcome.   REHAB POTENTIAL: Good  CLINICAL DECISION MAKING: Evolving/moderate complexity  EVALUATION COMPLEXITY: Moderate  PLAN:  PT FREQUENCY: 2x/week PT DURATION: 12 weeks PLANNED INTERVENTIONS: 97750- Physical Performance Testing, 97110-Therapeutic exercises, 97530- Therapeutic activity, 97112- Neuromuscular re-education, 97535- Self Care, [id number]- Manual therapy, [phone]- Gait training, [phone]-  Orthotic/Prosthetic subsequent, Patient/Family education, Balance training, Stair training, Joint mobilization, and DME instructions  PLAN FOR NEXT SESSION:  Continue to advance HIT training on Nustep for cardio fitness Limit overground AMB progression Formal RLE hip. ROM assessment HEP update with stretching routine Interventions to improve Knee load tolerance and low back activity tolerance.  Retest Left knee MMT Lite gait with offloading of residual limb    Note: Portions of this document were prepared using Dragon voice recognition software and although reviewed may contain unintentional dictation errors in syntax, grammar, or spelling.  Lonni KATHEE Gainer PT ,DPT Physical Therapist- [facility]   06/28/24, 4:29 PM

## 2024-06-29 ENCOUNTER — Ambulatory Visit: Admitting: Physical Therapy

## 2024-07-02 ENCOUNTER — Ambulatory Visit

## 2024-07-02 DIAGNOSIS — R262 Difficulty in walking, not elsewhere classified: Secondary | ICD-10-CM

## 2024-07-02 DIAGNOSIS — M6281 Muscle weakness (generalized): Secondary | ICD-10-CM

## 2024-07-02 NOTE — Therapy (Signed)
 OUTPATIENT PHYSICAL THERAPY TREATMENT  Patient Name: Penny Chase MRN: [id number] DOB:March 29, 1971, 53 y.o., female Today's Date: 07/02/2024  PCP: [facility] REFERRING PROVIDER: Delores Orvin BRAVO, NP  END OF SESSION:   PT End of Session - 07/02/24 1620     Visit Number 16    Number of Visits 25    Date for Recertification  07/25/24    Authorization Type [employer]et[employer]    Authorization Time Period Aetna [facility]    Progress Note Due on Visit 20    PT Start Time 1620    PT Stop Time 1700    PT Time Calculation (min) 40 min    Equipment Utilized During Treatment Gait belt    Activity Tolerance Patient tolerated treatment well;No increased pain    Behavior During Therapy [facility] for tasks assessed/performed         Past Medical History:  Diagnosis Date   Acute lower limb ischemia    Anemia    Breast cancer (HCC)    Cancer (HCC) 09/2018   Right breast   COVID-19 11/2018   Family history of breast cancer    GERD (gastroesophageal reflux disease)    Hypertension    Personal history of radiation therapy 2020   Rt breast   Pulmonary embolism [facility])    Past Surgical History:  Procedure Laterality Date   AMPUTATION Left 11/26/2023   Procedure: AMPUTATION BELOW KNEE;  Surgeon: Jama Cordella MATSU, MD;  Location: [facility] ORS;  Service: Vascular;  Laterality: Left;   BREAST BIOPSY Right 09/12/2018   us  core/venus clip-INVASIVE MAMMARYLobular CARCINOMA, WITH LOBULAR FEATURES   BREAST LUMPECTOMY Right 10/04/2018   BREAST LUMPECTOMY WITH NEEDLE LOCALIZATION AND AXILLARY SENTINEL LYMPH NODE BX Right 10/04/2018   Procedure: BREAST LUMPECTOMY WITH NEEDLE LOCALIZATION AND AXILLARY SENTINEL LYMPH NODE BX;  Surgeon: Rodolph Romano, MD;  Location: [facility] ORS;  Service: General;  Laterality: Right;   COLONOSCOPY N/A 11/28/2023   Procedure: COLONOSCOPY;  Surgeon: Jinny Carmine, MD;  Location: [facility] ENDOSCOPY;  Service: Endoscopy;  Laterality: N/A;   ESOPHAGOGASTRODUODENOSCOPY N/A 11/28/2023    Procedure: EGD (ESOPHAGOGASTRODUODENOSCOPY);  Surgeon: Jinny Carmine, MD;  Location: [facility] ENDOSCOPY;  Service: Endoscopy;  Laterality: N/A;   ESOPHAGOGASTRODUODENOSCOPY N/A 04/02/2024   Procedure: EGD (ESOPHAGOGASTRODUODENOSCOPY);  Surgeon: Therisa Bi, MD;  Location: [facility] ENDOSCOPY;  Service: Gastroenterology;  Laterality: N/A;   GIVENS CAPSULE STUDY N/A 11/28/2023   Procedure: IMAGING PROCEDURE, GI TRACT, INTRALUMINAL, VIA CAPSULE;  Surgeon: Jinny Carmine, MD;  Location: [facility] ENDOSCOPY;  Service: Endoscopy;  Laterality: N/A;   IVC FILTER INSERTION N/A 11/28/2023   Procedure: IVC FILTER INSERTION;  Surgeon: Jama Cordella MATSU, MD;  Location: [facility] INVASIVE CV LAB;  Service: Cardiovascular;  Laterality: N/A;   LOWER EXTREMITY ANGIOGRAPHY Left 11/24/2023   Procedure: Lower Extremity Angiography;  Surgeon: Jama Cordella MATSU, MD;  Location: [facility] INVASIVE CV LAB;  Service: Cardiovascular;  Laterality: Left;   TONSILLECTOMY     Patient Active Problem List   Diagnosis Date Noted   Lumbar spondylosis 03/08/2024   Complex regional pain syndrome type 2 of left lower extremity 03/08/2024   Atherosclerosis of native arteries of extremity with intermittent claudication 03/02/2024   Phantom limb syndrome with pain (HCC) 03/01/2024   Diverticulitis of colon 03/01/2024   Pain 02/13/2024   Hx of left BKA (HCC) 01/25/2024   Symptomatic anemia 12/01/2023   Multiple gastric ulcers 11/28/2023   Ischemic colitis 11/28/2023   Bloody stools 11/26/2023   Rectal bleeding 11/26/2023   Acute blood loss anemia 11/26/2023  Blister of left foot 11/24/2023   Limb ischemia 11/23/2023   Gangrene of left foot (HCC) 11/23/2023   Tobacco abuse 11/23/2023   PE (pulmonary thromboembolism) (HCC) 11/23/2023   GI bleeding 11/23/2023   Substance abuse (HCC) 11/23/2023   Leukocytosis 11/23/2023   Bipolar affective disorder, current episode manic (HCC) 01/06/2021   Brief psychotic disorder (HCC) 01/03/2021   Family history of  breast cancer    Carcinoma of upper-outer quadrant of right breast in female, estrogen receptor positive (HCC) 09/20/2018   Gastroesophageal reflux disease without esophagitis 01/04/2017   HTN (hypertension), benign 01/20/2014    ONSET DATE: 04/11/24  REFERRING DIAG: S10.487 (ICD-10-CM) - Hx of left BKA (HCC)   THERAPY DIAG:  Difficulty in walking, not elsewhere classified  Muscle weakness (generalized)  Rationale for Evaluation and Treatment: Rehabilitation  SUBJECTIVE:                                                                                                                                                                                             SUBJECTIVE STATEMENT:  Pt reports that her leg is feeling much better since the adjustment had been made, however she is unsure if she is still having the medial whip that was pointed out to her over the past several sessions.   PERTINENT HISTORY: Patient is a 53 year old female with acute limb ischemia s/p left BKA. PE in association with lower GI bleed s/p IVC filter.  PAIN:  Are you having pain? 6/10 back pain (8/10 earlier) thanks it may be her colitis.     PRECAUTIONS: None WEIGHT BEARING RESTRICTIONS: WBAT FALLS: Has patient fallen in last 6 months? Yes. Number of falls 2  LIVING ENVIRONMENT: Lives with: lives with their spouse Lives in: House/apartment Stairs: Ramped entrance Has following equipment at home: Environmental Consultant - 2 wheeled, Wheelchair (manual), and Architectural Technologist  PLOF: Independent  PATIENT GOALS: Get out of the wheelchair and be able to get outside and do yard work that needs to be done.  OBJECTIVE:  Note: Objective measures were completed at Evaluation unless otherwise noted.  DIAGNOSTIC FINDINGS:  EXAM: MRI LUMBAR SPINE WITHOUT CONTRAST  IMPRESSION: Generally mild for age lumbar spine degeneration. No spinal stenosis or convincing neural impingement.   However, small left-side annular fissures of the  discs at L4-L5 and L5-S1, which might be a source for Left L5 and/or S1 radiculitis (respectively).   LOWER EXTREMITY MMT:    MMT Right Eval Left Eval  Hip flexion 4 5  Knee flexion 5 3+  Knee extension 5 3+  (Blank rows = not tested)  TREATMENT DATE: 07/02/24   TA- To improve functional movements patterns for everyday tasks   Donned lite gait harness - LiteGait harness applied with patient in standing position. Straps secured snugly around torso and legs per manufacturer guidelines. Checked for proper alignment, comfort, and safety prior to standing and gait training. All buckles and fasteners verified secure; weight-bearing support adjusted to patient tolerance  Gait training - activities focussed on specific components of gait cycle and multimodal cueing for completion  Gait training on LiteGait - used LiteGait for safety initially, then to take pressure from LE with decreased WB and distraction force that was causing pain after the first ~2 minutes x20 minutes in total, with adjustments made throughout the overall attempt, ranging speeds from 0.8-1.4 mph   TA- To improve functional movements patterns for everyday tasks   Doffed lite gait harness- LiteGait harness removed with patient in standing position. Due to increased abdominal pressure from harness, pt observed for dizziness post removal before transitioning to sitting for recovery.      PATIENT EDUCATION: Education details: Pt educated on role of PT and services provided during current POC, along with prognosis and information about the clinic. Person educated: Patient and Spouse Education method: Explanation Education comprehension: verbalized understanding  HOME EXERCISE PROGRAM: Access Code: [email or web address] URL: [email or web address] Date: 05/14/2024 Prepared by: Peggye Linear  Exercises - Forward and backward step Right leg   - 1 x daily - 7 x weekly - 3 sets - 15 reps - Standing Hip Abduction with Counter Support  - 1 x daily - 7 x weekly - 2 sets - 10 reps - Mini Squat with Counter Support  - 1 x daily - 7 x weekly - 2 sets - 10 reps - Seated Hamstring Stretch  - 2 x daily - 1 sets - 2 reps - 45sec hold - Seated Knee Extension Stretch with Chair  - 2 x daily - 1 sets - 2 reps - 45sec hold - Supine Knee Extension Stretch on Towel Roll  - 2 x daily - 1 sets - 2 reps - 90 hold  GOALS: Goals reviewed with patient? Yes  SHORT TERM GOALS: Target date: 05/30/2024  Pt will be independent with HEP in order to demonstrate increased ability to perform tasks related to occupation/hobbies. Baseline: Goal status: INITIAL  LONG TERM GOALS: Target date: 07/25/2024  1.  Patient (> 32 years old) will complete five times sit to stand test in < 15 seconds indicating an increased LE strength and improved balance. Baseline: 11.36 sec 06/11/24: 12.73 sec Goal status: INITIAL   2.  Patient will reduce timed up and go to <11 seconds to reduce fall risk and demonstrate improved transfer/gait ability. Baseline: 19.53 sec  10/29: 25.52 sec Goal status: INITIAL  3.  Patient will increase 10 meter walk test to >1.35m/s as to improve gait speed for better community ambulation and to reduce fall risk. Baseline: TBD Goal status: INITIAL  4.  Patient will increase six minute walk test distance to >1000 for progression to community ambulator and improve gait ability. Baseline: 112 feet in 2 min and 8 sec  Goal status: INITIAL  5.  Pt will improve their Locomotor Capabilities Index (LCI-5) score by at least 7 points (MCID), demonstrating a clinically meaningful increase in ability to perform basic locomotor tasks with assistance and/or use of aids. Baseline: 06/11/24: 29 Goal Status: INITIAL  ASSESSMENT:  CLINICAL IMPRESSION:   Pt responded well to the exercises and  was able to tolerate  increased amount of distance and speed throughout the session.  Pt initially started ambulating with no offloading by the LiteGait, however did have some difficulty with the pain in her shoulders, so she was offloaded slightly and pt felt a lot of relief.  Pt overall is making good improvements, however still has medial whip which the pt was shown video demonstration of the gait abnormality.   Pt will continue to benefit from skilled therapy to address remaining deficits in order to improve overall QoL and return to PLOF.       OBJECTIVE IMPAIRMENTS: Abnormal gait, decreased activity tolerance, decreased balance, decreased endurance, decreased knowledge of use of DME, decreased mobility, difficulty walking, decreased ROM, decreased strength, prosthetic dependency , and pain.   ACTIVITY LIMITATIONS: carrying, lifting, bending, standing, squatting, stairs, transfers, bathing, toileting, dressing, hygiene/grooming, and caring for others  PARTICIPATION LIMITATIONS: meal prep, cleaning, laundry, driving, shopping, community activity, occupation, and yard work  PERSONAL FACTORS: Age, Behavior pattern, Fitness, Past/current experiences, Profession, Time since onset of injury/illness/exacerbation, and 3+ comorbidities: anemia, breast cancer, HTN, PE's are also affecting patient's functional outcome.   REHAB POTENTIAL: Good  CLINICAL DECISION MAKING: Evolving/moderate complexity  EVALUATION COMPLEXITY: Moderate  PLAN:  PT FREQUENCY: 2x/week PT DURATION: 12 weeks PLANNED INTERVENTIONS: 97750- Physical Performance Testing, 97110-Therapeutic exercises, 97530- Therapeutic activity, 97112- Neuromuscular re-education, 97535- Self Care, [id number]- Manual therapy, [phone]- Gait training, [phone]- Orthotic/Prosthetic subsequent, Patient/Family education, Balance training, Stair training, Joint mobilization, and DME instructions  PLAN FOR NEXT SESSION:   Continue to advance HIT training on Nustep  for cardio fitness Limit overground AMB progression Formal RLE hip. ROM assessment HEP update with stretching routine Interventions to improve Knee load tolerance and low back activity tolerance.  Retest Left knee MMT Lite gait with offloading of residual limb    Fonda Simpers, PT, DPT Physical Therapist - [facility]  07/02/24, 5:44 PM

## 2024-07-04 ENCOUNTER — Ambulatory Visit: Admitting: Physical Therapy

## 2024-07-05 ENCOUNTER — Ambulatory Visit

## 2024-07-05 DIAGNOSIS — M6281 Muscle weakness (generalized): Secondary | ICD-10-CM

## 2024-07-05 DIAGNOSIS — R262 Difficulty in walking, not elsewhere classified: Secondary | ICD-10-CM

## 2024-07-05 NOTE — Therapy (Signed)
 OUTPATIENT PHYSICAL THERAPY TREATMENT  Patient Name: Penny Chase MRN: [id number] DOB:August 04, 1971, 53 y.o., female Today's Date: 07/05/2024  PCP: [facility] REFERRING PROVIDER: Delores Orvin BRAVO, NP  END OF SESSION:   PT End of Session - 07/05/24 1535     Visit Number 17    Number of Visits 25    Date for Recertification  07/25/24    Authorization Type Aetna    Authorization Time Period Aetna [facility]    Progress Note Due on Visit 20    PT Start Time 1536    PT Stop Time 1615    PT Time Calculation (min) 39 min    Equipment Utilized During Treatment Gait belt    Activity Tolerance Patient tolerated treatment well;No increased pain    Behavior During Therapy [facility] for tasks assessed/performed          Past Medical History:  Diagnosis Date   Acute lower limb ischemia    Anemia    Breast cancer (HCC)    Cancer (HCC) 09/2018   Right breast   COVID-19 11/2018   Family history of breast cancer    GERD (gastroesophageal reflux disease)    Hypertension    Personal history of radiation therapy 2020   Rt breast   Pulmonary embolism [facility])    Past Surgical History:  Procedure Laterality Date   AMPUTATION Left 11/26/2023   Procedure: AMPUTATION BELOW KNEE;  Surgeon: Jama Cordella MATSU, MD;  Location: [facility] ORS;  Service: Vascular;  Laterality: Left;   BREAST BIOPSY Right 09/12/2018   us  core/venus clip-INVASIVE MAMMARYLobular CARCINOMA, WITH LOBULAR FEATURES   BREAST LUMPECTOMY Right 10/04/2018   BREAST LUMPECTOMY WITH NEEDLE LOCALIZATION AND AXILLARY SENTINEL LYMPH NODE BX Right 10/04/2018   Procedure: BREAST LUMPECTOMY WITH NEEDLE LOCALIZATION AND AXILLARY SENTINEL LYMPH NODE BX;  Surgeon: Rodolph Romano, MD;  Location: [facility] ORS;  Service: General;  Laterality: Right;   COLONOSCOPY N/A 11/28/2023   Procedure: COLONOSCOPY;  Surgeon: Jinny Carmine, MD;  Location: [facility] ENDOSCOPY;  Service: Endoscopy;  Laterality: N/A;   ESOPHAGOGASTRODUODENOSCOPY N/A  11/28/2023   Procedure: EGD (ESOPHAGOGASTRODUODENOSCOPY);  Surgeon: Jinny Carmine, MD;  Location: [facility] ENDOSCOPY;  Service: Endoscopy;  Laterality: N/A;   ESOPHAGOGASTRODUODENOSCOPY N/A 04/02/2024   Procedure: EGD (ESOPHAGOGASTRODUODENOSCOPY);  Surgeon: Therisa Bi, MD;  Location: [facility] ENDOSCOPY;  Service: Gastroenterology;  Laterality: N/A;   GIVENS CAPSULE STUDY N/A 11/28/2023   Procedure: IMAGING PROCEDURE, GI TRACT, INTRALUMINAL, VIA CAPSULE;  Surgeon: Jinny Carmine, MD;  Location: [facility] ENDOSCOPY;  Service: Endoscopy;  Laterality: N/A;   IVC FILTER INSERTION N/A 11/28/2023   Procedure: IVC FILTER INSERTION;  Surgeon: Jama Cordella MATSU, MD;  Location: [facility] INVASIVE CV LAB;  Service: Cardiovascular;  Laterality: N/A;   LOWER EXTREMITY ANGIOGRAPHY Left 11/24/2023   Procedure: Lower Extremity Angiography;  Surgeon: Jama Cordella MATSU, MD;  Location: [facility] INVASIVE CV LAB;  Service: Cardiovascular;  Laterality: Left;   TONSILLECTOMY     Patient Active Problem List   Diagnosis Date Noted   Lumbar spondylosis 03/08/2024   Complex regional pain syndrome type 2 of left lower extremity 03/08/2024   Atherosclerosis of native arteries of extremity with intermittent claudication 03/02/2024   Phantom limb syndrome with pain (HCC) 03/01/2024   Diverticulitis of colon 03/01/2024   Pain 02/13/2024   Hx of left BKA (HCC) 01/25/2024   Symptomatic anemia 12/01/2023   Multiple gastric ulcers 11/28/2023   Ischemic colitis 11/28/2023   Bloody stools 11/26/2023   Rectal bleeding 11/26/2023   Acute blood loss anemia  11/26/2023   Blister of left foot 11/24/2023   Limb ischemia 11/23/2023   Gangrene of left foot (HCC) 11/23/2023   Tobacco abuse 11/23/2023   PE (pulmonary thromboembolism) (HCC) 11/23/2023   GI bleeding 11/23/2023   Substance abuse (HCC) 11/23/2023   Leukocytosis 11/23/2023   Bipolar affective disorder, current episode manic (HCC) 01/06/2021   Brief psychotic disorder (HCC) 01/03/2021   Family  history of breast cancer    Carcinoma of upper-outer quadrant of right breast in female, estrogen receptor positive (HCC) 09/20/2018   Gastroesophageal reflux disease without esophagitis 01/04/2017   HTN (hypertension), benign 01/20/2014    ONSET DATE: 04/11/24  REFERRING DIAG: S10.487 (ICD-10-CM) - Hx of left BKA (HCC)   THERAPY DIAG:  Difficulty in walking, not elsewhere classified  Muscle weakness (generalized)  Rationale for Evaluation and Treatment: Rehabilitation  SUBJECTIVE:                                                                                                                                                                                             SUBJECTIVE STATEMENT:  Pt reporting that she put a pain patch on last night across the knee and in her low back.  Pt otherwise doing well, just having phantom pain.    PERTINENT HISTORY: Patient is a 53 year old female with acute limb ischemia s/p left BKA. PE in association with lower GI bleed s/p IVC filter.  PAIN:  Are you having pain? 6/10 back pain (8/10 earlier) thanks it may be her colitis.     PRECAUTIONS: None WEIGHT BEARING RESTRICTIONS: WBAT FALLS: Has patient fallen in last 6 months? Yes. Number of falls 2  LIVING ENVIRONMENT: Lives with: lives with their spouse Lives in: House/apartment Stairs: Ramped entrance Has following equipment at home: Environmental Consultant - 2 wheeled, Wheelchair (manual), and Architectural Technologist  PLOF: Independent  PATIENT GOALS: Get out of the wheelchair and be able to get outside and do yard work that needs to be done.  OBJECTIVE:  Note: Objective measures were completed at Evaluation unless otherwise noted.  DIAGNOSTIC FINDINGS:  EXAM: MRI LUMBAR SPINE WITHOUT CONTRAST  IMPRESSION: Generally mild for age lumbar spine degeneration. No spinal stenosis or convincing neural impingement.   However, small left-side annular fissures of the discs at L4-L5 and L5-S1, which might be a source  for Left L5 and/or S1 radiculitis (respectively).   LOWER EXTREMITY MMT:    MMT Right Eval Left Eval  Hip flexion 4 5  Knee flexion 5 3+  Knee extension 5 3+  (Blank rows = not tested)  TREATMENT DATE: 07/05/24    Gait training - activities focussed on specific components of gait cycle and multimodal cueing for completion  Ambulation in // bars without any AD, CGA utilized, x2 laps but having minimal pain in the knee and some in the hip  TA- To improve functional movements patterns for everyday tasks   Standing hip abduction, 2x10 each LE to promote weight shift on the L prosthetic Standing hip extensions, 2x10 each LE to promote weight shift on the L prosthetic  Donned lite gait harness - LiteGait harness applied with patient in standing position. Straps secured snugly around torso and legs per manufacturer guidelines. Checked for proper alignment, comfort, and safety prior to standing and gait training. All buckles and fasteners verified secure; weight-bearing support adjusted to patient tolerance  Gait training - activities focussed on specific components of gait cycle and multimodal cueing for completion  Gait training on LiteGait - used LiteGait for safety initially, then to take pressure from LE with decreased WB and distraction force that was causing pain after the first ~2 minutes x20 minutes in total, with adjustments made throughout the overall attempt, ranging speeds from 0.8-1.4 mph  First attempt 5 minutes with speed ramping from 0.8-1.2 mph Second attempt 15 minutes with speed set to 1.0 mph, on decline level 5.0 with the belt reversed to assist with toe clearance of the L prosthetic limb  TA- To improve functional movements patterns for everyday tasks   Doffed lite gait harness- LiteGait harness removed with patient in standing position. Due to  increased abdominal pressure from harness, pt observed for dizziness post removal before transitioning to sitting for recovery.      PATIENT EDUCATION: Education details: Pt educated on role of PT and services provided during current POC, along with prognosis and information about the clinic. Person educated: Patient and Spouse Education method: Explanation Education comprehension: verbalized understanding  HOME EXERCISE PROGRAM: Access Code: [email or web address] URL: [email or web address] Date: 05/14/2024 Prepared by: Peggye Linear  Exercises - Forward and backward step Right leg   - 1 x daily - 7 x weekly - 3 sets - 15 reps - Standing Hip Abduction with Counter Support  - 1 x daily - 7 x weekly - 2 sets - 10 reps - Mini Squat with Counter Support  - 1 x daily - 7 x weekly - 2 sets - 10 reps - Seated Hamstring Stretch  - 2 x daily - 1 sets - 2 reps - 45sec hold - Seated Knee Extension Stretch with Chair  - 2 x daily - 1 sets - 2 reps - 45sec hold - Supine Knee Extension Stretch on Towel Roll  - 2 x daily - 1 sets - 2 reps - 90 hold  GOALS: Goals reviewed with patient? Yes  SHORT TERM GOALS: Target date: 05/30/2024  Pt will be independent with HEP in order to demonstrate increased ability to perform tasks related to occupation/hobbies. Baseline: Goal status: INITIAL  LONG TERM GOALS: Target date: 07/25/2024  1.  Patient (> 47 years old) will complete five times sit to stand test in < 15 seconds indicating an increased LE strength and improved balance. Baseline: 11.36 sec 06/11/24: 12.73 sec Goal status: INITIAL   2.  Patient will reduce timed up and go to <11 seconds to reduce fall risk and demonstrate improved transfer/gait ability. Baseline: 19.53 sec  10/29: 25.52 sec Goal status: INITIAL  3.  Patient will increase 10 meter walk test to >1.5m/s as to improve gait speed for  better community ambulation and to reduce fall risk. Baseline: TBD Goal status: INITIAL  4.   Patient will increase six minute walk test distance to >1000 for progression to community ambulator and improve gait ability. Baseline: 112 feet in 2 min and 8 sec  Goal status: INITIAL  5.  Pt will improve their Locomotor Capabilities Index (LCI-5) score by at least 7 points (MCID), demonstrating a clinically meaningful increase in ability to perform basic locomotor tasks with assistance and/or use of aids. Baseline: 06/11/24: 29 Goal Status: INITIAL  ASSESSMENT:  CLINICAL IMPRESSION:   Pt continues to put forth great effort throughout the sessions, and wants to push herself.  Pt is still having residual pain in the limb and needed to be offloaded more with the use of the LiteGait today.  Pt also noting increased pain in the shoulder likely from bursitis from overuse when ambulating with the walker.  Pt ultimately will continue to improve gait mechanics with use of LiteGait and other means of ambulation to improve overall tolerance and safety with walking.   Pt will continue to benefit from skilled therapy to address remaining deficits in order to improve overall QoL and return to PLOF.          OBJECTIVE IMPAIRMENTS: Abnormal gait, decreased activity tolerance, decreased balance, decreased endurance, decreased knowledge of use of DME, decreased mobility, difficulty walking, decreased ROM, decreased strength, prosthetic dependency , and pain.   ACTIVITY LIMITATIONS: carrying, lifting, bending, standing, squatting, stairs, transfers, bathing, toileting, dressing, hygiene/grooming, and caring for others  PARTICIPATION LIMITATIONS: meal prep, cleaning, laundry, driving, shopping, community activity, occupation, and yard work  PERSONAL FACTORS: Age, Behavior pattern, Fitness, Past/current experiences, Profession, Time since onset of injury/illness/exacerbation, and 3+ comorbidities: anemia, breast cancer, HTN, PE's are also affecting patient's functional outcome.   REHAB POTENTIAL:  Good  CLINICAL DECISION MAKING: Evolving/moderate complexity  EVALUATION COMPLEXITY: Moderate  PLAN:  PT FREQUENCY: 2x/week PT DURATION: 12 weeks PLANNED INTERVENTIONS: 97750- Physical Performance Testing, 97110-Therapeutic exercises, 97530- Therapeutic activity, 97112- Neuromuscular re-education, 97535- Self Care, [id number]- Manual therapy, [phone]- Gait training, [phone]- Orthotic/Prosthetic subsequent, Patient/Family education, Balance training, Stair training, Joint mobilization, and DME instructions  PLAN FOR NEXT SESSION:   Continue to advance HIT training on Nustep for cardio fitness Limit overground AMB progression Formal RLE hip. ROM assessment HEP update with stretching routine Interventions to improve Knee load tolerance and low back activity tolerance.  Retest Left knee MMT Lite gait with offloading of residual limb    Fonda Simpers, PT, DPT Physical Therapist - [facility]  07/05/24, 5:32 PM

## 2024-07-06 ENCOUNTER — Ambulatory Visit

## 2024-07-10 ENCOUNTER — Other Ambulatory Visit (INDEPENDENT_AMBULATORY_CARE_PROVIDER_SITE_OTHER): Payer: Self-pay | Admitting: Nurse Practitioner

## 2024-07-10 ENCOUNTER — Ambulatory Visit

## 2024-07-10 ENCOUNTER — Telehealth (INDEPENDENT_AMBULATORY_CARE_PROVIDER_SITE_OTHER): Payer: Self-pay | Admitting: Nurse Practitioner

## 2024-07-10 NOTE — Telephone Encounter (Signed)
 Patient LVM on [facility] stating that she had a really hard fall on her stump leg. Would like to see FB today. Please advise what should be scheduled for the patient

## 2024-07-10 NOTE — Telephone Encounter (Signed)
 I spoke with the patient and she stated that she fell this morning on her left stump. Patient is having swelling around the bone area. The patient states that bottom of the stump is tender, sore, and having hot pain. Patient is requesting something for pain.

## 2024-07-10 NOTE — Telephone Encounter (Signed)
 Unfortunately we will not be able to send anything in for the patient.  It is our policy that we do not send in pain medication when you are receiving it from an outside source.  Most like she is receiving regular pain medication from another provider and received 90 tablets on 07/02/2024.  I would recommend that if she is concerned she visit the emergency room to evaluate if she has a crack or fracture in her stump

## 2024-07-11 ENCOUNTER — Emergency Department
Admission: EM | Admit: 2024-07-11 | Discharge: 2024-07-11 | Disposition: A | Discharge status: Home / Self Care | Attending: Emergency Medicine | Admitting: Emergency Medicine

## 2024-07-11 ENCOUNTER — Other Ambulatory Visit: Payer: Self-pay

## 2024-07-11 ENCOUNTER — Emergency Department

## 2024-07-11 DIAGNOSIS — M79605 Pain in left leg: Secondary | ICD-10-CM | POA: Diagnosis present

## 2024-07-11 DIAGNOSIS — W19XXXA Unspecified fall, initial encounter: Secondary | ICD-10-CM

## 2024-07-11 DIAGNOSIS — I10 Essential (primary) hypertension: Secondary | ICD-10-CM | POA: Insufficient documentation

## 2024-07-11 DIAGNOSIS — W050XXA Fall from non-moving wheelchair, initial encounter: Secondary | ICD-10-CM | POA: Diagnosis not present

## 2024-07-11 MED ORDER — LIDOCAINE 5 % EX PTCH
1.0000 | MEDICATED_PATCH | CUTANEOUS | Status: DC
  Administered 2024-07-11: 1 via TRANSDERMAL
  Filled 2024-07-11: qty 1

## 2024-07-11 MED ORDER — KETOROLAC TROMETHAMINE 30 MG/ML IJ SOLN
30.0000 mg | Freq: Once | INTRAMUSCULAR | Status: AC
  Administered 2024-07-11: 30 mg via INTRAMUSCULAR
  Filled 2024-07-11: qty 1

## 2024-07-11 MED ORDER — OXYCODONE HCL 5 MG PO TABS
5.0000 mg | ORAL_TABLET | Freq: Once | ORAL | Status: AC
Start: 2024-07-11 — End: 2024-07-11
  Administered 2024-07-11: 5 mg via ORAL
  Filled 2024-07-11: qty 1

## 2024-07-11 MED ORDER — TRAMADOL HCL ER 100 MG PO TB24: 100.0000 mg | ORAL_TABLET | Freq: Every day | ORAL | 0 refills | Status: AC

## 2024-07-11 NOTE — Telephone Encounter (Signed)
 Patient was notified with medical recommendation and verbalized understanding

## 2024-07-11 NOTE — ED Triage Notes (Signed)
 Pt to ED for c/o fall yesterday. Pt states she landed hard on her left leg amputation. Pt states she has taken oxycodone  around 600 this morning, but it did not relieve the pain.

## 2024-07-11 NOTE — Discharge Instructions (Addendum)
 Your evaluated in the ED for pain to your left lower extremity.  Your x-ray showed diffuse subcutaneous edema below your  IMPRESSION:  1. Diffuse subcutaneous edema.  2. No acute bony abnormality.  3. Diffuse osteopenia.  4. Unremarkable left below-knee amputation.    Apply ice to the affected area to help reduce swelling.  Keep the limb elevated is much as possible.  Do not try to put place your prosthetic leg until swelling has reduced.  Follow-up with your primary care provider.  If any new or worsening symptoms occur return to ED for further evaluation.

## 2024-07-11 NOTE — ED Provider Notes (Signed)
 [facility] Emergency Department Provider Note     Event Date/Time   First MD Initiated Contact with Patient 07/11/24 1620     (approximate)   History   Fall   HPI  Penny Chase is a 53 y.o. female with a past medical history of HTN, GERD, and BKA of the left lower extremity presents to the ED with complaint of a fall from her wheelchair.  Patient reports she had placed her amputated limb in her wheelchair and slipped causing her amputated limb to slam into the floor.  She is complaining of swelling and pain over the side of her leg amputation.  She has tried oxycodone  with no relief in her pain. No head injury or LOC.      Physical Exam   Triage Vital Signs: ED Triage Vitals  Encounter Vitals Group     BP 07/11/24 1435 130/79     Girls Systolic BP Percentile --      Girls Diastolic BP Percentile --      Boys Systolic BP Percentile --      Boys Diastolic BP Percentile --      Pulse Rate 07/11/24 1435 99     Resp 07/11/24 1435 16     Temp 07/11/24 1435 99 F (37.2 C)     Temp Source 07/11/24 1435 Oral     SpO2 07/11/24 1435 99 %     Weight 07/11/24 1438 194 lb (88 kg)     Height 07/11/24 1438 5' 4 (1.626 m)     Head Circumference --      Peak Flow --      Pain Score 07/11/24 1436 8     Pain Loc --      Pain Education --      Exclude from Growth Chart --     Most recent vital signs: Vitals:   07/11/24 1435 07/11/24 1754  BP: 130/79 123/79  Pulse: 99 75  Resp: 16 16  Temp: 99 F (37.2 C)   SpO2: 99% 98%    General Awake, no distress.  HEENT NCAT.  CV:  Good peripheral perfusion.  RESP:  Normal effort.  ABD:  No distention.  Other:  Left BKA shows no ecchymosis or redness.  Palpable swelling is palpated distally with mild tenderness.  Patient is able to flex and extend at her knee joint.  Sensation intact.   ED Results / Procedures / Treatments   Labs (all labs ordered are listed, but only abnormal results are  displayed) Labs Reviewed - No data to display  RADIOLOGY  I personally viewed and evaluated these images as part of my medical decision making, as well as reviewing the written report by the radiologist.  ED Provider Interpretation: no acute bony abnormality  DG Knee Complete 4 Views Right Result Date: 07/11/2024 CLINICAL DATA:  Clemens, prior below-knee amputation EXAM: DG KNEE COMPLETE 4+V*R* COMPARISON:  None Available. FINDINGS: Frontal, bilateral oblique, lateral views of the left knee demonstrate prior below-knee amputation. No bony abnormalities at the amputation site. The bones are diffusely osteopenic. No acute displaced fracture, subluxation, or dislocation. No joint effusion. Diffuse subcutaneous edema. IMPRESSION: 1. Diffuse subcutaneous edema. 2. No acute bony abnormality. 3. Diffuse osteopenia. 4. Unremarkable left below-knee amputation. Electronically Signed   By: Ozell Daring M.D.   On: 07/11/2024 18:01    PROCEDURES:  Critical Care performed: No  Procedures   MEDICATIONS ORDERED IN ED: Medications  lidocaine  (LIDODERM ) 5 % 1 patch (  1 patch Transdermal Patch Applied 07/11/24 1639)  oxyCODONE  (Oxy IR/ROXICODONE ) immediate release tablet 5 mg (5 mg Oral Given 07/11/24 1639)  ketorolac  (TORADOL ) 30 MG/ML injection 30 mg (30 mg Intramuscular Given 07/11/24 1641)     IMPRESSION / MDM / ASSESSMENT AND PLAN / ED COURSE  I reviewed the triage vital signs and the nursing notes.                              Clinical Course as of 07/11/24 1939  Wed Jul 11, 2024  1844 DG Knee Complete 4 Views Right IMPRESSION: 1. Diffuse subcutaneous edema. 2. No acute bony abnormality. 3. Diffuse osteopenia. 4. Unremarkable left below-knee amputation.   [MH]    Clinical Course User Index [MH] Margrette Monte A, PA-C    54 y.o. female presents to the emergency department for evaluation and treatment of left BKA pain following a fall. See HPI for further details.   Differential  diagnosis includes, but is not limited to fracture, contusion, hematoma  Patient's presentation is most consistent with acute complicated illness / injury requiring diagnostic workup.  Patient is alert and oriented.  She is hemodynamically stable.  Physical exam findings are as stated above.  X-ray is reassuring.  Does show diffuse subcutaneous edema.  RICE therapy education provided patient reports as long as she remains immobile with the leg elevated she is in no pain.  This is reassuring.  Lidocaine  patches applied over leg amputation region.  Review of the [place] CSRS was performed in accordance of the [place] prior to dispensing any controlled drugs.  Tramadol  sent to pharmacy for breakthrough pain.  Patient stable condition for discharge home.  ED return precautions discussed.  FINAL CLINICAL IMPRESSION(S) / ED DIAGNOSES   Final diagnoses:  Fall, initial encounter  Pain of left lower extremity   Rx / DC Orders   ED Discharge Orders          Ordered    traMADol  (ULTRAM -ER) 100 MG 24 hr tablet  Daily        07/11/24 1852           Note:  This document was prepared using Dragon voice recognition software and may include unintentional dictation errors.    Margrette, Nathan Moctezuma A, PA-C 07/11/24 2242    Jacolyn Pae, MD 07/13/24 [phone]

## 2024-07-16 ENCOUNTER — Ambulatory Visit: Attending: Nurse Practitioner

## 2024-07-16 DIAGNOSIS — M6281 Muscle weakness (generalized): Secondary | ICD-10-CM | POA: Insufficient documentation

## 2024-07-16 DIAGNOSIS — R262 Difficulty in walking, not elsewhere classified: Secondary | ICD-10-CM | POA: Diagnosis present

## 2024-07-16 NOTE — Therapy (Signed)
 OUTPATIENT PHYSICAL THERAPY TREATMENT  Patient Name: Penny Chase MRN: [id number] DOB:10-Apr-1971, 53 y.o., female Today's Date: 07/16/2024  PCP: [facility] REFERRING PROVIDER: Delores Orvin BRAVO, NP  END OF SESSION:   PT End of Session - 07/16/24 1526     Visit Number 18    Number of Visits 25    Date for Recertification  07/25/24    Authorization Type Aetna    Authorization Time Period Aetna [facility]    Progress Note Due on Visit 20    PT Start Time 1451    PT Stop Time 1530    PT Time Calculation (min) 39 min    Equipment Utilized During Treatment Gait belt    Activity Tolerance Patient tolerated treatment well;No increased pain    Behavior During Therapy [facility] for tasks assessed/performed          Past Medical History:  Diagnosis Date   Acute lower limb ischemia    Anemia    Breast cancer (HCC)    Cancer (HCC) 09/2018   Right breast   COVID-19 11/2018   Family history of breast cancer    GERD (gastroesophageal reflux disease)    Hypertension    Personal history of radiation therapy 2020   Rt breast   Pulmonary embolism [facility])    Past Surgical History:  Procedure Laterality Date   AMPUTATION Left 11/26/2023   Procedure: AMPUTATION BELOW KNEE;  Surgeon: Jama Cordella MATSU, MD;  Location: [facility] ORS;  Service: Vascular;  Laterality: Left;   BREAST BIOPSY Right 09/12/2018   us  core/venus clip-INVASIVE MAMMARYLobular CARCINOMA, WITH LOBULAR FEATURES   BREAST LUMPECTOMY Right 10/04/2018   BREAST LUMPECTOMY WITH NEEDLE LOCALIZATION AND AXILLARY SENTINEL LYMPH NODE BX Right 10/04/2018   Procedure: BREAST LUMPECTOMY WITH NEEDLE LOCALIZATION AND AXILLARY SENTINEL LYMPH NODE BX;  Surgeon: Rodolph Romano, MD;  Location: [facility] ORS;  Service: General;  Laterality: Right;   COLONOSCOPY N/A 11/28/2023   Procedure: COLONOSCOPY;  Surgeon: Jinny Carmine, MD;  Location: [facility] ENDOSCOPY;  Service: Endoscopy;  Laterality: N/A;   ESOPHAGOGASTRODUODENOSCOPY N/A 11/28/2023    Procedure: EGD (ESOPHAGOGASTRODUODENOSCOPY);  Surgeon: Jinny Carmine, MD;  Location: [facility] ENDOSCOPY;  Service: Endoscopy;  Laterality: N/A;   ESOPHAGOGASTRODUODENOSCOPY N/A 04/02/2024   Procedure: EGD (ESOPHAGOGASTRODUODENOSCOPY);  Surgeon: Therisa Bi, MD;  Location: [facility] ENDOSCOPY;  Service: Gastroenterology;  Laterality: N/A;   GIVENS CAPSULE STUDY N/A 11/28/2023   Procedure: IMAGING PROCEDURE, GI TRACT, INTRALUMINAL, VIA CAPSULE;  Surgeon: Jinny Carmine, MD;  Location: [facility] ENDOSCOPY;  Service: Endoscopy;  Laterality: N/A;   IVC FILTER INSERTION N/A 11/28/2023   Procedure: IVC FILTER INSERTION;  Surgeon: Jama Cordella MATSU, MD;  Location: [facility] INVASIVE CV LAB;  Service: Cardiovascular;  Laterality: N/A;   LOWER EXTREMITY ANGIOGRAPHY Left 11/24/2023   Procedure: Lower Extremity Angiography;  Surgeon: Jama Cordella MATSU, MD;  Location: [facility] INVASIVE CV LAB;  Service: Cardiovascular;  Laterality: Left;   TONSILLECTOMY     Patient Active Problem List   Diagnosis Date Noted   Lumbar spondylosis 03/08/2024   Complex regional pain syndrome type 2 of left lower extremity 03/08/2024   Atherosclerosis of native arteries of extremity with intermittent claudication 03/02/2024   Phantom limb syndrome with pain (HCC) 03/01/2024   Diverticulitis of colon 03/01/2024   Pain 02/13/2024   Hx of left BKA (HCC) 01/25/2024   Symptomatic anemia 12/01/2023   Multiple gastric ulcers 11/28/2023   Ischemic colitis 11/28/2023   Bloody stools 11/26/2023   Rectal bleeding 11/26/2023   Acute blood loss anemia  11/26/2023   Blister of left foot 11/24/2023   Limb ischemia 11/23/2023   Gangrene of left foot (HCC) 11/23/2023   Tobacco abuse 11/23/2023   PE (pulmonary thromboembolism) (HCC) 11/23/2023   GI bleeding 11/23/2023   Substance abuse (HCC) 11/23/2023   Leukocytosis 11/23/2023   Bipolar affective disorder, current episode manic (HCC) 01/06/2021   Brief psychotic disorder (HCC) 01/03/2021   Family history of  breast cancer    Carcinoma of upper-outer quadrant of right breast in female, estrogen receptor positive (HCC) 09/20/2018   Gastroesophageal reflux disease without esophagitis 01/04/2017   HTN (hypertension), benign 01/20/2014    ONSET DATE: 04/11/24  REFERRING DIAG: S10.487 (ICD-10-CM) - Hx of left BKA (HCC)   THERAPY DIAG:  Difficulty in walking, not elsewhere classified  Muscle weakness (generalized)  Rationale for Evaluation and Treatment: Rehabilitation  SUBJECTIVE:                                                                                                                                                                                             SUBJECTIVE STATEMENT: Pt sustained a fall out of WC onto her residual limb morning of 25th, contacted vascular surgery, but unable to see them in clinic, ultimately FU in ED. Provider in ED reported no fractures, but relayed findings of bone contusion and advised against use of socket/prosthesis for 1 week. Pain has improved overall since then, swelling as well. Pt has been elevating and icing.    PERTINENT HISTORY: Patient is a 53 year old female with acute limb ischemia s/p left BKA. PE in association with lower GI bleed s/p IVC filter.  PAIN:  Are you having pain? None  PRECAUTIONS: None WEIGHT BEARING RESTRICTIONS: WBAT FALLS: Has patient fallen in last 6 months? Yes. Number of falls 2  LIVING ENVIRONMENT: Lives with: lives with their spouse Lives in: House/apartment Stairs: Ramped entrance Has following equipment at home: Environmental Consultant - 2 wheeled, Wheelchair (manual), and Architectural Technologist  PLOF: Independent  PATIENT GOALS: Get out of the wheelchair and be able to get outside and do yard work that needs to be done.  OBJECTIVE:  Note: Objective measures were completed at Evaluation unless otherwise noted.  DIAGNOSTIC FINDINGS:  EXAM: MRI LUMBAR SPINE WITHOUT CONTRAST  IMPRESSION: Generally mild for age lumbar spine  degeneration. No spinal stenosis or convincing neural impingement.   However, small left-side annular fissures of the discs at L4-L5 and L5-S1, which might be a source for Left L5 and/or S1 radiculitis (respectively).   LOWER EXTREMITY MMT:    MMT Right Eval Left Eval  Hip flexion 4 5  Knee flexion 5 3+  Knee extension 5 3+  (Blank rows = not tested)  ROM Assessment Hip 07/16/24    Right  Left   ABDCT Supine  35  External rotation (90/90)  65  Internal Rotation (90/90)  35  Prone Extension ROM 25* -5 (pectineus pain)   External Rotation Prone  50  Internal Rotation Prone  30  Prone knee fleixon  60                                                                                                                                TREATMENT DATE: 07/16/24 -inspection of residual limb since ground level fall 6 days prior:   *no buising, no gross edema, focal firm contusion edema at the distal 3CM of anterior left tibia   *no abrasion, skin intact   -ROM assessment of hip: pt has tightness of proximal adductors Left that limit hip extension and limit rotation in neutral sagittal plane. Explain how this would create limitations in transverse plan during AMB overground  -Prone LLE hip extension stretch on towel roll 2x2 minutes, Rt: 1x3 minutes  -hooklying high adductor stretch with ball  between knees  -seated LLE LAQ 1x15x3secH  -seated LLE hamstirngs set into plinth 15x3secH  -education on new pectineus stretches issued for home, importance of improving hip extension ROM. -education on why pt is not permitted to practive weightbearing actiivty today, given continued bone contusion swelling, pain, and MD orders to avoid use of prosthesis x7 days.      PATIENT EDUCATION: Education details: Pt educated on role of PT and services provided during current POC, along with prognosis and information about the clinic. Person educated: Patient and Spouse Education method:  Explanation Education comprehension: verbalized understanding  HOME EXERCISE PROGRAM: Access Code: [email or web address] URL: [email or web address] Date: 05/14/2024 Prepared by: Peggye Linear  Exercises - Forward and backward step Right leg   - 1 x daily - 7 x weekly - 3 sets - 15 reps - Standing Hip Abduction with Counter Support  - 1 x daily - 7 x weekly - 2 sets - 10 reps - Mini Squat with Counter Support  - 1 x daily - 7 x weekly - 2 sets - 10 reps - Seated Hamstring Stretch  - 2 x daily - 1 sets - 2 reps - 45sec hold - Seated Knee Extension Stretch with Chair  - 2 x daily - 1 sets - 2 reps - 45sec hold - Supine Knee Extension Stretch on Towel Roll  - 2 x daily - 1 sets - 2 reps - 90 hold  GOALS: Goals reviewed with patient? Yes  SHORT TERM GOALS: Target date: 05/30/2024  Pt will be independent with HEP in order to demonstrate increased ability to perform tasks related to occupation/hobbies. Baseline: Goal status: INITIAL  LONG TERM GOALS: Target date: 07/25/2024  1.  Patient (> 51 years old) will complete five times sit to stand test in < 15 seconds indicating an  increased LE strength and improved balance. Baseline: 11.36 sec 06/11/24: 12.73 sec Goal status: INITIAL   2.  Patient will reduce timed up and go to <11 seconds to reduce fall risk and demonstrate improved transfer/gait ability. Baseline: 19.53 sec  10/29: 25.52 sec Goal status: INITIAL  3.  Patient will increase 10 meter walk test to >1.33m/s as to improve gait speed for better community ambulation and to reduce fall risk. Baseline: TBD Goal status: INITIAL  4.  Patient will increase six minute walk test distance to >1000 for progression to community ambulator and improve gait ability. Baseline: 112 feet in 2 min and 8 sec  Goal status: INITIAL  5.  Pt will improve their Locomotor Capabilities Index (LCI-5) score by at least 7 points (MCID), demonstrating a clinically meaningful increase in ability to  perform basic locomotor tasks with assistance and/or use of aids. Baseline: 06/11/24: 29 Goal Status: INITIAL  ASSESSMENT:  CLINICAL IMPRESSION:  Pt's tibial contusion appears to be healing fairly well overall, remains quit edematous. Took formal hip ROM, discussed importance of improved musculoskeletal restrictions to hip extension ROM impairment, particularly in improving gait mechanics. Pt will continue to benefit from skilled therapy to address remaining deficits in order to improve overall QoL and return to PLOF.    OBJECTIVE IMPAIRMENTS: Abnormal gait, decreased activity tolerance, decreased balance, decreased endurance, decreased knowledge of use of DME, decreased mobility, difficulty walking, decreased ROM, decreased strength, prosthetic dependency , and pain.   ACTIVITY LIMITATIONS: carrying, lifting, bending, standing, squatting, stairs, transfers, bathing, toileting, dressing, hygiene/grooming, and caring for others  PARTICIPATION LIMITATIONS: meal prep, cleaning, laundry, driving, shopping, community activity, occupation, and yard work  PERSONAL FACTORS: Age, Behavior pattern, Fitness, Past/current experiences, Profession, Time since onset of injury/illness/exacerbation, and 3+ comorbidities: anemia, breast cancer, HTN, PE's are also affecting patient's functional outcome.   REHAB POTENTIAL: Good  CLINICAL DECISION MAKING: Evolving/moderate complexity  EVALUATION COMPLEXITY: Moderate  PLAN:  PT FREQUENCY: 2x/week PT DURATION: 12 weeks PLANNED INTERVENTIONS: 97750- Physical Performance Testing, 97110-Therapeutic exercises, 97530- Therapeutic activity, 97112- Neuromuscular re-education, 97535- Self Care, 02859- Manual therapy, [phone]- Gait training, [phone]- Orthotic/Prosthetic subsequent, Patient/Family education, Balance training, Stair training, Joint mobilization, and DME instructions  PLAN FOR NEXT SESSION:  -Slow progression back to load bearing activity following tibial  contusion 07/10/24.  -promote prone hip extension stretches at home twice daily     3:31 PM, 07/16/24 Peggye JAYSON Linear, PT, DPT Physical Therapist - [facility]  Outpatient Physical Therapy- Main Campus [phone]

## 2024-07-18 ENCOUNTER — Other Ambulatory Visit (INDEPENDENT_AMBULATORY_CARE_PROVIDER_SITE_OTHER): Payer: Self-pay | Admitting: Nurse Practitioner

## 2024-07-18 ENCOUNTER — Ambulatory Visit

## 2024-07-18 DIAGNOSIS — M6281 Muscle weakness (generalized): Secondary | ICD-10-CM

## 2024-07-18 DIAGNOSIS — R262 Difficulty in walking, not elsewhere classified: Secondary | ICD-10-CM

## 2024-07-18 NOTE — Therapy (Signed)
 OUTPATIENT PHYSICAL THERAPY TREATMENT  Patient Name: Penny Chase MRN: [id number] DOB:[birth year], 53 y.o., female Today's Date: 07/18/2024  PCP: [facility] REFERRING PROVIDER: Delores Orvin BRAVO, NP  END OF SESSION:   PT End of Session - 07/18/24 1146     Number of Visits 25    Date for Recertification  07/25/24    Authorization Type Aetna    Authorization Time Period Aetna [facility] [address]    Progress Note Due on Visit 20    PT Start Time 1146    PT Stop Time 1230    PT Time Calculation (min) 44 min    Equipment Utilized During Treatment Gait belt    Activity Tolerance Patient tolerated treatment well;No increased pain    Behavior During Therapy [facility] for tasks assessed/performed          Past Medical History:  Diagnosis Date   Acute lower limb ischemia    Anemia    Breast cancer (HCC)    Cancer (HCC) 09/2018   Right breast   COVID-19 11/2018   Family history of breast cancer    GERD (gastroesophageal reflux disease)    Hypertension    Personal history of radiation therapy 2020   Rt breast   Pulmonary embolism [facility])    Past Surgical History:  Procedure Laterality Date   AMPUTATION Left 11/26/2023   Procedure: AMPUTATION BELOW KNEE;  Surgeon: Jama Cordella MATSU, MD;  Location: [facility] ORS;  Service: Vascular;  Laterality: Left;   BREAST BIOPSY Right 09/12/2018   us  core/venus clip-INVASIVE MAMMARYLobular CARCINOMA, WITH LOBULAR FEATURES   BREAST LUMPECTOMY Right 10/04/2018   BREAST LUMPECTOMY WITH NEEDLE LOCALIZATION AND AXILLARY SENTINEL LYMPH NODE BX Right 10/04/2018   Procedure: BREAST LUMPECTOMY WITH NEEDLE LOCALIZATION AND AXILLARY SENTINEL LYMPH NODE BX;  Surgeon: Rodolph Romano, MD;  Location: [facility] ORS;  Service: General;  Laterality: Right;   COLONOSCOPY N/A 11/28/2023   Procedure: COLONOSCOPY;  Surgeon: Jinny Carmine, MD;  Location: [facility] ENDOSCOPY;  Service: Endoscopy;  Laterality: N/A;   ESOPHAGOGASTRODUODENOSCOPY N/A 11/28/2023   Procedure: EGD  (ESOPHAGOGASTRODUODENOSCOPY);  Surgeon: Jinny Carmine, MD;  Location: [facility] ENDOSCOPY;  Service: Endoscopy;  Laterality: N/A;   ESOPHAGOGASTRODUODENOSCOPY N/A 04/02/2024   Procedure: EGD (ESOPHAGOGASTRODUODENOSCOPY);  Surgeon: Therisa Bi, MD;  Location: [facility] ENDOSCOPY;  Service: Gastroenterology;  Laterality: N/A;   GIVENS CAPSULE STUDY N/A 11/28/2023   Procedure: IMAGING PROCEDURE, GI TRACT, INTRALUMINAL, VIA CAPSULE;  Surgeon: Jinny Carmine, MD;  Location: [facility] ENDOSCOPY;  Service: Endoscopy;  Laterality: N/A;   IVC FILTER INSERTION N/A 11/28/2023   Procedure: IVC FILTER INSERTION;  Surgeon: Jama Cordella MATSU, MD;  Location: [facility] INVASIVE CV LAB;  Service: Cardiovascular;  Laterality: N/A;   LOWER EXTREMITY ANGIOGRAPHY Left 11/24/2023   Procedure: Lower Extremity Angiography;  Surgeon: Jama Cordella MATSU, MD;  Location: [facility] INVASIVE CV LAB;  Service: Cardiovascular;  Laterality: Left;   TONSILLECTOMY     Patient Active Problem List   Diagnosis Date Noted   Lumbar spondylosis 03/08/2024   Complex regional pain syndrome type 2 of left lower extremity 03/08/2024   Atherosclerosis of native arteries of extremity with intermittent claudication 03/02/2024   Phantom limb syndrome with pain (HCC) 03/01/2024   Diverticulitis of colon 03/01/2024   Pain 02/13/2024   Hx of left BKA (HCC) 01/25/2024   Symptomatic anemia 12/01/2023   Multiple gastric ulcers 11/28/2023   Ischemic colitis 11/28/2023   Bloody stools 11/26/2023   Rectal bleeding 11/26/2023   Acute blood loss anemia 11/26/2023   Blister of left  foot 11/24/2023   Limb ischemia 11/23/2023   Gangrene of left foot (HCC) 11/23/2023   Tobacco abuse 11/23/2023   PE (pulmonary thromboembolism) (HCC) 11/23/2023   GI bleeding 11/23/2023   Substance abuse (HCC) 11/23/2023   Leukocytosis 11/23/2023   Bipolar affective disorder, current episode manic (HCC) 01/06/2021   Brief psychotic disorder (HCC) 01/03/2021   Family history of breast cancer     Carcinoma of upper-outer quadrant of right breast in female, estrogen receptor positive (HCC) 09/20/2018   Gastroesophageal reflux disease without esophagitis 01/04/2017   HTN (hypertension), benign 01/20/2014    ONSET DATE: 04/11/24  REFERRING DIAG: S10.487 (ICD-10-CM) - Hx of left BKA (HCC)   THERAPY DIAG:  No diagnosis found.  Rationale for Evaluation and Treatment: Rehabilitation  SUBJECTIVE:                                                                                                                                                                                             SUBJECTIVE STATEMENT: Pt. Reports doing okay today. She reports it has been one week since her fall. She reports that she is still having pain in the distal residual limb and that the goose egg is still present. She reports having 3/10 pain in the distal let at this time with rest. She reports the pain posterior to the tibia at distal limb is better, but the pain at the distal tibia is still present.   Patient does report she put a little weight through the limb this morning when getting out of car and was able to tolerate it fine.   PERTINENT HISTORY: Patient is a 53 year old female with acute limb ischemia s/p left BKA. PE in association with lower GI bleed s/p IVC filter.  PAIN:  Are you having pain? None  PRECAUTIONS: None WEIGHT BEARING RESTRICTIONS: WBAT FALLS: Has patient fallen in last 6 months? Yes. Number of falls 2  LIVING ENVIRONMENT: Lives with: lives with their spouse Lives in: House/apartment Stairs: Ramped entrance Has following equipment at home: Environmental Consultant - 2 wheeled, Wheelchair (manual), and Architectural Technologist  PLOF: Independent  PATIENT GOALS: Get out of the wheelchair and be able to get outside and do yard work that needs to be done.  OBJECTIVE:  Note: Objective measures were completed at Evaluation unless otherwise noted.  DIAGNOSTIC FINDINGS:  EXAM: MRI LUMBAR SPINE WITHOUT  CONTRAST  IMPRESSION: Generally mild for age lumbar spine degeneration. No spinal stenosis or convincing neural impingement.   However, small left-side annular fissures of the discs at L4-L5 and L5-S1, which might be a source for Left L5 and/or S1 radiculitis (respectively).  LOWER EXTREMITY MMT:    MMT Right Eval Left Eval  Hip flexion 4 5  Knee flexion 5 3+  Knee extension 5 3+  (Blank rows = not tested)  ROM Assessment Hip 07/16/24    Right  Left   ABDCT Supine  35  External rotation (90/90)  65  Internal Rotation (90/90)  35  Prone Extension ROM 25* -5 (pectineus pain)   External Rotation Prone  50  Internal Rotation Prone  30  Prone knee fleixon  60                                                                                                                                TREATMENT DATE: 07/18/24 -inspection of residual limb since ground level fall.    *no buising, no gross edema  *no abrasion, skin intact  *reduced contusion, but still tender to touch at distal tibia.   -Gait in // bars x2 laps  -Backward walking in // bars x2 laps.  -Lateral side-stepping in // bars.   -Gait with FWW 150' with CGA for safety. Pain 3/10 initially progressing to 6/10 by end of lap.   -Gait x15' from [facility] to mat with CGA using FWW.   -Seated LAQ on elevated mat. 2x15.   -Supine hip flexor stretch 5x1' prosthetic donned with assist for limb lowering.     PATIENT EDUCATION: Education details: Pt educated on role of PT and services provided during current POC, along with prognosis and information about the clinic. Person educated: Patient and Spouse Education method: Explanation Education comprehension: verbalized understanding  HOME EXERCISE PROGRAM: Access Code: [email or web address] URL: [email or web address] Date: 05/14/2024 Prepared by: Peggye Linear  Exercises - Forward and backward step Right leg   - 1 x daily - 7 x weekly - 3 sets - 15 reps - Standing Hip  Abduction with Counter Support  - 1 x daily - 7 x weekly - 2 sets - 10 reps - Mini Squat with Counter Support  - 1 x daily - 7 x weekly - 2 sets - 10 reps - Seated Hamstring Stretch  - 2 x daily - 1 sets - 2 reps - 45sec hold - Seated Knee Extension Stretch with Chair  - 2 x daily - 1 sets - 2 reps - 45sec hold - Supine Knee Extension Stretch on Towel Roll  - 2 x daily - 1 sets - 2 reps - 90 hold  GOALS: Goals reviewed with patient? Yes  SHORT TERM GOALS: Target date: 05/30/2024  Pt will be independent with HEP in order to demonstrate increased ability to perform tasks related to occupation/hobbies. Baseline: Goal status: INITIAL  LONG TERM GOALS: Target date: 07/25/2024  1.  Patient (> 94 years old) will complete five times sit to stand test in < 15 seconds indicating an increased LE strength and improved balance. Baseline: 11.36 sec 06/11/24: 12.73 sec Goal status: INITIAL   2.  Patient will reduce timed  up and go to <11 seconds to reduce fall risk and demonstrate improved transfer/gait ability. Baseline: 19.53 sec  10/29: 25.52 sec Goal status: INITIAL  3.  Patient will increase 10 meter walk test to >1.34m/s as to improve gait speed for better community ambulation and to reduce fall risk. Baseline: TBD Goal status: INITIAL  4.  Patient will increase six minute walk test distance to >1000 for progression to community ambulator and improve gait ability. Baseline: 112 feet in 2 min and 8 sec  Goal status: INITIAL  5.  Pt will improve their Locomotor Capabilities Index (LCI-5) score by at least 7 points (MCID), demonstrating a clinically meaningful increase in ability to perform basic locomotor tasks with assistance and/or use of aids. Baseline: 06/11/24: 29 Goal Status: INITIAL  ASSESSMENT:  CLINICAL IMPRESSION:  It has been greater than 7 days since her fall, therefore, we began to perform standing activities today. She warmed up walking forward/backward/lateral in the //  bars which she tolerated well with minimal pain increase in distal residual limb. She then ambulated 150' with FWW with CGA for safety. She reported the residual limb pain began to increase with gait to 6/10 at end of lap, but was tolerable. She then performed non-weight bearing exercises for increased LE strength and mobility. Patient was noted to have tight hip flexors/adductors with supine stretch that improved with time throughout stretch. Overall, patient tolerated treatment well with returning to standing activities following her fall. She continues to be very motivated to improve throughout her POC.   OBJECTIVE IMPAIRMENTS: Abnormal gait, decreased activity tolerance, decreased balance, decreased endurance, decreased knowledge of use of DME, decreased mobility, difficulty walking, decreased ROM, decreased strength, prosthetic dependency , and pain.   ACTIVITY LIMITATIONS: carrying, lifting, bending, standing, squatting, stairs, transfers, bathing, toileting, dressing, hygiene/grooming, and caring for others  PARTICIPATION LIMITATIONS: meal prep, cleaning, laundry, driving, shopping, community activity, occupation, and yard work  PERSONAL FACTORS: Age, Behavior pattern, Fitness, Past/current experiences, Profession, Time since onset of injury/illness/exacerbation, and 3+ comorbidities: anemia, breast cancer, HTN, PE's are also affecting patient's functional outcome.   REHAB POTENTIAL: Good  CLINICAL DECISION MAKING: Evolving/moderate complexity  EVALUATION COMPLEXITY: Moderate  PLAN:  PT FREQUENCY: 2x/week PT DURATION: 12 weeks PLANNED INTERVENTIONS: 97750- Physical Performance Testing, 97110-Therapeutic exercises, 97530- Therapeutic activity, 97112- Neuromuscular re-education, 97535- Self Care, 02859- Manual therapy, [phone]- Gait training, [phone]- Orthotic/Prosthetic subsequent, Patient/Family education, Balance training, Stair training, Joint mobilization, and DME instructions  PLAN FOR  NEXT SESSION:  -Continue slow progress to load bearing activity following tibial contusion 07/10/24.  -promote improved hip extension.     1:20 PM, 07/18/24 Peggye JAYSON Linear, PT, DPT Physical Therapist - [facility]  Outpatient Physical Therapy- Main Campus [phone]

## 2024-07-23 ENCOUNTER — Telehealth (INDEPENDENT_AMBULATORY_CARE_PROVIDER_SITE_OTHER): Payer: Self-pay

## 2024-07-23 ENCOUNTER — Ambulatory Visit

## 2024-07-23 DIAGNOSIS — M6281 Muscle weakness (generalized): Secondary | ICD-10-CM

## 2024-07-23 DIAGNOSIS — R262 Difficulty in walking, not elsewhere classified: Secondary | ICD-10-CM | POA: Diagnosis not present

## 2024-07-23 NOTE — Telephone Encounter (Signed)
 Patient called at this time in reference to medication being sent to pharmacy. Verified at this time patients medication has been sent to the pharmacy and is ready for pick up

## 2024-07-23 NOTE — Therapy (Signed)
 OUTPATIENT PHYSICAL THERAPY TREATMENT  Patient Name: Penny Chase MRN: [id number] DOB:[birth year], 53 y.o., female Today's Date: 07/23/2024  PCP: [facility] REFERRING PROVIDER: Delores Orvin BRAVO, NP  END OF SESSION:   PT End of Session - 07/23/24 1529     Visit Number 20    Number of Visits 25    Date for Recertification  07/25/24    Authorization Type Aetna    Authorization Time Period Aetna [facility]    Progress Note Due on Visit 20    PT Start Time 1530    PT Stop Time 1615    PT Time Calculation (min) 45 min    Equipment Utilized During Treatment Gait belt    Activity Tolerance Patient tolerated treatment well;No increased pain    Behavior During Therapy [facility] for tasks assessed/performed          Past Medical History:  Diagnosis Date   Acute lower limb ischemia    Anemia    Breast cancer (HCC)    Cancer (HCC) 09/2018   Right breast   COVID-19 11/2018   Family history of breast cancer    GERD (gastroesophageal reflux disease)    Hypertension    Personal history of radiation therapy 2020   Rt breast   Pulmonary embolism [facility])    Past Surgical History:  Procedure Laterality Date   AMPUTATION Left 11/26/2023   Procedure: AMPUTATION BELOW KNEE;  Surgeon: Jama Cordella MATSU, MD;  Location: [facility] ORS;  Service: Vascular;  Laterality: Left;   BREAST BIOPSY Right 09/12/2018   us  core/venus clip-INVASIVE MAMMARYLobular CARCINOMA, WITH LOBULAR FEATURES   BREAST LUMPECTOMY Right 10/04/2018   BREAST LUMPECTOMY WITH NEEDLE LOCALIZATION AND AXILLARY SENTINEL LYMPH NODE BX Right 10/04/2018   Procedure: BREAST LUMPECTOMY WITH NEEDLE LOCALIZATION AND AXILLARY SENTINEL LYMPH NODE BX;  Surgeon: Rodolph Romano, MD;  Location: [facility] ORS;  Service: General;  Laterality: Right;   COLONOSCOPY N/A 11/28/2023   Procedure: COLONOSCOPY;  Surgeon: Jinny Carmine, MD;  Location: [facility] ENDOSCOPY;  Service: Endoscopy;  Laterality: N/A;   ESOPHAGOGASTRODUODENOSCOPY N/A 11/28/2023    Procedure: EGD (ESOPHAGOGASTRODUODENOSCOPY);  Surgeon: Jinny Carmine, MD;  Location: [facility] ENDOSCOPY;  Service: Endoscopy;  Laterality: N/A;   ESOPHAGOGASTRODUODENOSCOPY N/A 04/02/2024   Procedure: EGD (ESOPHAGOGASTRODUODENOSCOPY);  Surgeon: Therisa Bi, MD;  Location: [facility] ENDOSCOPY;  Service: Gastroenterology;  Laterality: N/A;   GIVENS CAPSULE STUDY N/A 11/28/2023   Procedure: IMAGING PROCEDURE, GI TRACT, INTRALUMINAL, VIA CAPSULE;  Surgeon: Jinny Carmine, MD;  Location: [facility] ENDOSCOPY;  Service: Endoscopy;  Laterality: N/A;   IVC FILTER INSERTION N/A 11/28/2023   Procedure: IVC FILTER INSERTION;  Surgeon: Jama Cordella MATSU, MD;  Location: [facility] INVASIVE CV LAB;  Service: Cardiovascular;  Laterality: N/A;   LOWER EXTREMITY ANGIOGRAPHY Left 11/24/2023   Procedure: Lower Extremity Angiography;  Surgeon: Jama Cordella MATSU, MD;  Location: [facility] INVASIVE CV LAB;  Service: Cardiovascular;  Laterality: Left;   TONSILLECTOMY     Patient Active Problem List   Diagnosis Date Noted   Lumbar spondylosis 03/08/2024   Complex regional pain syndrome type 2 of left lower extremity 03/08/2024   Atherosclerosis of native arteries of extremity with intermittent claudication 03/02/2024   Phantom limb syndrome with pain (HCC) 03/01/2024   Diverticulitis of colon 03/01/2024   Pain 02/13/2024   Hx of left BKA (HCC) 01/25/2024   Symptomatic anemia 12/01/2023   Multiple gastric ulcers 11/28/2023   Ischemic colitis 11/28/2023   Bloody stools 11/26/2023   Rectal bleeding 11/26/2023   Acute blood loss anemia  11/26/2023   Blister of left foot 11/24/2023   Limb ischemia 11/23/2023   Gangrene of left foot (HCC) 11/23/2023   Tobacco abuse 11/23/2023   PE (pulmonary thromboembolism) (HCC) 11/23/2023   GI bleeding 11/23/2023   Substance abuse (HCC) 11/23/2023   Leukocytosis 11/23/2023   Bipolar affective disorder, current episode manic (HCC) 01/06/2021   Brief psychotic disorder (HCC) 01/03/2021   Family history of  breast cancer    Carcinoma of upper-outer quadrant of right breast in female, estrogen receptor positive (HCC) 09/20/2018   Gastroesophageal reflux disease without esophagitis 01/04/2017   HTN (hypertension), benign 01/20/2014    ONSET DATE: 04/11/24  REFERRING DIAG: S10.487 (ICD-10-CM) - Hx of left BKA (HCC)   THERAPY DIAG:  Difficulty in walking, not elsewhere classified  Muscle weakness (generalized)  Rationale for Evaluation and Treatment: Rehabilitation  SUBJECTIVE:                                                                                                                                                                                             SUBJECTIVE STATEMENT:  Pt reports she is doing ok, but noting that she had a little harder time getting the prosthetic to snap in place.  Pt still having some bruising around the distal residual limb.  Pt notes that the pain is 3/10 today, but most notably in the low back.  Pt is noting weird pins and needles on the anterior portion of the stump from the knee down.    PERTINENT HISTORY: Patient is a 53 year old female with acute limb ischemia s/p left BKA. PE in association with lower GI bleed s/p IVC filter.  PAIN:  Are you having pain? None  PRECAUTIONS: None WEIGHT BEARING RESTRICTIONS: WBAT FALLS: Has patient fallen in last 6 months? Yes. Number of falls 2  LIVING ENVIRONMENT: Lives with: lives with their spouse Lives in: House/apartment Stairs: Ramped entrance Has following equipment at home: Environmental Consultant - 2 wheeled, Wheelchair (manual), and Architectural Technologist  PLOF: Independent  PATIENT GOALS: Get out of the wheelchair and be able to get outside and do yard work that needs to be done.  OBJECTIVE:  Note: Objective measures were completed at Evaluation unless otherwise noted.  DIAGNOSTIC FINDINGS:  EXAM: MRI LUMBAR SPINE WITHOUT CONTRAST  IMPRESSION: Generally mild for age lumbar spine degeneration. No spinal stenosis or  convincing neural impingement.   However, small left-side annular fissures of the discs at L4-L5 and L5-S1, which might be a source for Left L5 and/or S1 radiculitis (respectively).   LOWER EXTREMITY MMT:    MMT Right Eval Left Eval  Hip flexion 4 5  Knee flexion 5 3+  Knee extension 5 3+  (Blank rows = not tested)  ROM Assessment Hip 07/16/24    Right  Left   ABDCT Supine  35  External rotation (90/90)  65  Internal Rotation (90/90)  35  Prone Extension ROM 25* -5 (pectineus pain)   External Rotation Prone  50  Internal Rotation Prone  30  Prone knee fleixon  60                                                                                                                                TREATMENT DATE: 07/23/24   -inspection of residual limb since ground level fall.    *no buising, no gross edema  *no abrasion, skin intact  *pt still having some residual bruising noted by the yellowing of the skin in certain light   Physical Performance Testing:  6 Min Walk Test:  Instructed patient to ambulate as quickly and as safely as possible for 6 minutes using LRAD. Patient was allowed to take standing rest breaks without stopping the test, but if the patient required a sitting rest break the clock would be stopped and the test would be over.  Results: 161 feet using a rolling walker with CGA, and stopping at 1 min 42 sec. Results indicate that the patient has reduced endurance with ambulation compared to age matched norms.  Age Matched Norms (in meters): 79-69 yo M: 10 F: 52, 75-79 yo M: 24 F: 471, 60-89 yo M: 417 F: 392 MDC: 58.21 meters (190.98 feet) or 50 meters (ANPTA Core Set of Outcome Measures for Adults with Neurologic Conditions, 2018)  Five times Sit to Stand Test (FTSS)  TIME: 13.15 sec  Cut off scores indicative of increased fall risk: >12 sec CVA, >16 sec PD, >13 sec vestibular (ANPTA Core Set of Outcome Measures for Adults with Neurologic Conditions,  2018)  10 Meter Walk Test: Patient instructed to walk 10 meters (32.8 ft) as quickly and as safely as possible at their normal speed Results: 0.49 m/s (20.34 seconds with RW)  Cut off scores:   Household Ambulator  < 0.4 m/s  Limited Community Ambulator  0.4 - 0.8 m/s  [employer]  > 0.8 m/s  Increased fall risk  < 1.39m/s  Crossing a Street  >1.24m/s  MCID 0.05 m/s (small), 0.13 m/s (moderate), 0.06 m/s (significant)  (ANPTA Core Set of Outcome Measures for Adults with Neurologic Conditions, 2018)       PATIENT EDUCATION: Education details: Pt educated on role of PT and services provided during current POC, along with prognosis and information about the clinic. Person educated: Patient and Spouse Education method: Explanation Education comprehension: verbalized understanding  HOME EXERCISE PROGRAM: Access Code: [email or web address] URL: [email or web address] Date: 05/14/2024 Prepared by: Peggye Linear  Exercises - Forward and backward step Right leg   - 1 x daily - 7 x weekly - 3 sets - 15 reps - Standing  Hip Abduction with Counter Support  - 1 x daily - 7 x weekly - 2 sets - 10 reps - Mini Squat with Counter Support  - 1 x daily - 7 x weekly - 2 sets - 10 reps - Seated Hamstring Stretch  - 2 x daily - 1 sets - 2 reps - 45sec hold - Seated Knee Extension Stretch with Chair  - 2 x daily - 1 sets - 2 reps - 45sec hold - Supine Knee Extension Stretch on Towel Roll  - 2 x daily - 1 sets - 2 reps - 90 hold  GOALS: Goals reviewed with patient? Yes  SHORT TERM GOALS: Target date: 05/30/2024  Pt will be independent with HEP in order to demonstrate increased ability to perform tasks related to occupation/hobbies. Baseline: Goal status: INITIAL  LONG TERM GOALS: Target date: 07/25/2024  1.  Patient (> 85 years old) will complete five times sit to stand test in < 15 seconds indicating an increased LE strength and improved balance. Baseline: 11.36 sec 06/11/24:  12.73 sec 07/23/24: 13.15 sec Goal status: PROGRESSING   2.  Patient will reduce timed up and go to <11 seconds to reduce fall risk and demonstrate improved transfer/gait ability. Baseline: 19.53 sec  10/29: 25.52 sec 07/23/24: 26.24 sec Goal status: PROGRESSING  3.  Patient will increase 10 meter walk test to >1.31m/s as to improve gait speed for better community ambulation and to reduce fall risk. Baseline: TBD 07/23/24: 20.34 sec Goal status: INITIAL  4.  Patient will increase six minute walk test distance to >1000 for progression to community ambulator and improve gait ability. Baseline: 112 feet in 2 min and 8 sec  07/23/24: 161 in 1 min 42 Goal status: INITIAL  5.  Pt will improve their Locomotor Capabilities Index (LCI-5) score by at least 7 points (MCID), demonstrating a clinically meaningful increase in ability to perform basic locomotor tasks with assistance and/or use of aids. Baseline: 06/11/24: 29 08/12/24: 37/56 Goal Status: INITIAL   ASSESSMENT:  CLINICAL IMPRESSION:   Pt underwent goal assessment and as expected after recovering from her fall, she regressed on several of her goals.  Pt had been making significant improvements prior to the fall, and pt has only slightly regressed at this time.  Pt will slowly progress towards goals and weight bearing in order to not aggravate the symptoms in the limb.  Pt agreeable to this plan and therapist spent time to encourage the pt of the progress that she made.   Pt will continue to benefit from skilled therapy to address remaining deficits in order to improve overall QoL and return to PLOF.       OBJECTIVE IMPAIRMENTS: Abnormal gait, decreased activity tolerance, decreased balance, decreased endurance, decreased knowledge of use of DME, decreased mobility, difficulty walking, decreased ROM, decreased strength, prosthetic dependency , and pain.   ACTIVITY LIMITATIONS: carrying, lifting, bending, standing, squatting, stairs,  transfers, bathing, toileting, dressing, hygiene/grooming, and caring for others  PARTICIPATION LIMITATIONS: meal prep, cleaning, laundry, driving, shopping, community activity, occupation, and yard work  PERSONAL FACTORS: Age, Behavior pattern, Fitness, Past/current experiences, Profession, Time since onset of injury/illness/exacerbation, and 3+ comorbidities: anemia, breast cancer, HTN, PE's are also affecting patient's functional outcome.   REHAB POTENTIAL: Good  CLINICAL DECISION MAKING: Evolving/moderate complexity  EVALUATION COMPLEXITY: Moderate  PLAN:  PT FREQUENCY: 2x/week PT DURATION: 12 weeks PLANNED INTERVENTIONS: 97750- Physical Performance Testing, 97110-Therapeutic exercises, 97530- Therapeutic activity, [id number]- Neuromuscular re-education, 97535- Self Care, [id number]- Manual therapy, [id number]- Gait  training, [id number]- Orthotic/Prosthetic subsequent, Patient/Family education, Balance training, Stair training, Joint mobilization, and DME instructions  PLAN FOR NEXT SESSION:   -Continue slow progress to load bearing activity following tibial contusion 07/10/24.  -promote improved hip extension.     Fonda Simpers, PT, DPT Physical Therapist - [facility]  07/23/24, 4:52 PM

## 2024-07-25 ENCOUNTER — Ambulatory Visit

## 2024-07-25 DIAGNOSIS — R262 Difficulty in walking, not elsewhere classified: Secondary | ICD-10-CM

## 2024-07-25 DIAGNOSIS — M6281 Muscle weakness (generalized): Secondary | ICD-10-CM

## 2024-07-25 NOTE — Therapy (Signed)
 OUTPATIENT PHYSICAL THERAPY TREATMENT/RE-CERT  Patient Name: Penny Chase MRN: [id number] DOB:1971-01-23, 53 y.o., female Today's Date: 07/25/2024  PCP: [facility] REFERRING PROVIDER: Delores Orvin BRAVO, NP  END OF SESSION:   PT End of Session - 07/25/24 1619     Visit Number 21    Number of Visits 41    Date for Recertification  09/19/24    Authorization Type Aetna    Authorization Time Period Aetna [facility]    Progress Note Due on Visit 20    PT Start Time 1620    PT Stop Time 1700    PT Time Calculation (min) 40 min    Equipment Utilized During Treatment Gait belt    Activity Tolerance Patient tolerated treatment well;No increased pain    Behavior During Therapy [facility] for tasks assessed/performed          Past Medical History:  Diagnosis Date   Acute lower limb ischemia    Anemia    Breast cancer (HCC)    Cancer (HCC) 09/2018   Right breast   COVID-19 11/2018   Family history of breast cancer    GERD (gastroesophageal reflux disease)    Hypertension    Personal history of radiation therapy 2020   Rt breast   Pulmonary embolism [facility])    Past Surgical History:  Procedure Laterality Date   AMPUTATION Left 11/26/2023   Procedure: AMPUTATION BELOW KNEE;  Surgeon: Jama Cordella MATSU, MD;  Location: [facility] ORS;  Service: Vascular;  Laterality: Left;   BREAST BIOPSY Right 09/12/2018   us  core/venus clip-INVASIVE MAMMARYLobular CARCINOMA, WITH LOBULAR FEATURES   BREAST LUMPECTOMY Right 10/04/2018   BREAST LUMPECTOMY WITH NEEDLE LOCALIZATION AND AXILLARY SENTINEL LYMPH NODE BX Right 10/04/2018   Procedure: BREAST LUMPECTOMY WITH NEEDLE LOCALIZATION AND AXILLARY SENTINEL LYMPH NODE BX;  Surgeon: Rodolph Romano, MD;  Location: [facility] ORS;  Service: General;  Laterality: Right;   COLONOSCOPY N/A 11/28/2023   Procedure: COLONOSCOPY;  Surgeon: Jinny Carmine, MD;  Location: [facility] ENDOSCOPY;  Service: Endoscopy;  Laterality: N/A;   ESOPHAGOGASTRODUODENOSCOPY N/A  11/28/2023   Procedure: EGD (ESOPHAGOGASTRODUODENOSCOPY);  Surgeon: Jinny Carmine, MD;  Location: [facility] ENDOSCOPY;  Service: Endoscopy;  Laterality: N/A;   ESOPHAGOGASTRODUODENOSCOPY N/A 04/02/2024   Procedure: EGD (ESOPHAGOGASTRODUODENOSCOPY);  Surgeon: Therisa Bi, MD;  Location: [facility] ENDOSCOPY;  Service: Gastroenterology;  Laterality: N/A;   GIVENS CAPSULE STUDY N/A 11/28/2023   Procedure: IMAGING PROCEDURE, GI TRACT, INTRALUMINAL, VIA CAPSULE;  Surgeon: Jinny Carmine, MD;  Location: [facility] ENDOSCOPY;  Service: Endoscopy;  Laterality: N/A;   IVC FILTER INSERTION N/A 11/28/2023   Procedure: IVC FILTER INSERTION;  Surgeon: Jama Cordella MATSU, MD;  Location: [facility] INVASIVE CV LAB;  Service: Cardiovascular;  Laterality: N/A;   LOWER EXTREMITY ANGIOGRAPHY Left 11/24/2023   Procedure: Lower Extremity Angiography;  Surgeon: Jama Cordella MATSU, MD;  Location: [facility] INVASIVE CV LAB;  Service: Cardiovascular;  Laterality: Left;   TONSILLECTOMY     Patient Active Problem List   Diagnosis Date Noted   Lumbar spondylosis 03/08/2024   Complex regional pain syndrome type 2 of left lower extremity 03/08/2024   Atherosclerosis of native arteries of extremity with intermittent claudication 03/02/2024   Phantom limb syndrome with pain (HCC) 03/01/2024   Diverticulitis of colon 03/01/2024   Pain 02/13/2024   Hx of left BKA (HCC) 01/25/2024   Symptomatic anemia 12/01/2023   Multiple gastric ulcers 11/28/2023   Ischemic colitis 11/28/2023   Bloody stools 11/26/2023   Rectal bleeding 11/26/2023   Acute blood loss anemia  11/26/2023   Blister of left foot 11/24/2023   Limb ischemia 11/23/2023   Gangrene of left foot (HCC) 11/23/2023   Tobacco abuse 11/23/2023   PE (pulmonary thromboembolism) (HCC) 11/23/2023   GI bleeding 11/23/2023   Substance abuse (HCC) 11/23/2023   Leukocytosis 11/23/2023   Bipolar affective disorder, current episode manic (HCC) 01/06/2021   Brief psychotic disorder (HCC) 01/03/2021   Family  history of breast cancer    Carcinoma of upper-outer quadrant of right breast in female, estrogen receptor positive (HCC) 09/20/2018   Gastroesophageal reflux disease without esophagitis 01/04/2017   HTN (hypertension), benign 01/20/2014    ONSET DATE: 04/11/24  REFERRING DIAG: S10.487 (ICD-10-CM) - Hx of left BKA (HCC)   THERAPY DIAG:  Difficulty in walking, not elsewhere classified  Muscle weakness (generalized)  Rationale for Evaluation and Treatment: Rehabilitation  SUBJECTIVE:                                                                                                                                                                                             SUBJECTIVE STATEMENT:  Pt reports she is ready to continue with ambulation attempt, but does report she had a fall today.  Pt notes that the floor was wet and her R LE slipped out from underneath her.  She fell on her buttocks, so it did not hurt her, but she did have difficulty getting back up.   PERTINENT HISTORY: Patient is a 53 year old female with acute limb ischemia s/p left BKA. PE in association with lower GI bleed s/p IVC filter.  PAIN:  Are you having pain? None  PRECAUTIONS: None WEIGHT BEARING RESTRICTIONS: WBAT FALLS: Has patient fallen in last 6 months? Yes. Number of falls 2  LIVING ENVIRONMENT: Lives with: lives with their spouse Lives in: House/apartment Stairs: Ramped entrance Has following equipment at home: Environmental Consultant - 2 wheeled, Wheelchair (manual), and Architectural Technologist  PLOF: Independent  PATIENT GOALS: Get out of the wheelchair and be able to get outside and do yard work that needs to be done.  OBJECTIVE:  Note: Objective measures were completed at Evaluation unless otherwise noted.  DIAGNOSTIC FINDINGS:  EXAM: MRI LUMBAR SPINE WITHOUT CONTRAST  IMPRESSION: Generally mild for age lumbar spine degeneration. No spinal stenosis or convincing neural impingement.   However, small left-side  annular fissures of the discs at L4-L5 and L5-S1, which might be a source for Left L5 and/or S1 radiculitis (respectively).   LOWER EXTREMITY MMT:    MMT Right Eval Left Eval  Hip flexion 4 5  Knee flexion 5 3+  Knee extension 5 3+  (Blank rows =  not tested)  ROM Assessment Hip 07/16/24    Right  Left   ABDCT Supine  35  External rotation (90/90)  65  Internal Rotation (90/90)  35  Prone Extension ROM 25* -5 (pectineus pain)   External Rotation Prone  50  Internal Rotation Prone  30  Prone knee fleixon  60                                                                                                                                TREATMENT DATE: 07/25/24   TA- To improve functional movements patterns for everyday tasks    Donned lite gait harness - LiteGait harness applied with patient in standing position. Straps secured snugly around torso and legs per manufacturer guidelines. Checked for proper alignment, comfort, and safety prior to standing and gait training. All buckles and fasteners verified secure; weight-bearing support adjusted to patient tolerance   Gait training - activities focussed on specific components of gait cycle and multimodal cueing for completion   Gait training on LiteGait - used LiteGait for safety initially, then to take pressure from LE with decreased WB and distraction force that was causing pain after the first ~2 minutes x18 minutes in total, with adjustments made throughout the overall attempt, ranging speeds from 0.8-1.4 mph, and one attempt to perform downslope walking, but pt's foot got caught under the back of the machine.  Pt noted some stinging in the toes, but stated it resolved and wanted to continue walking.  Pt able to ambulate 1830' in total  During the incident in which the pt's R foot hit the machine, therapist hit the stop button immediately and pt noted some stinging in the toes.  Pt elected to continue, but will continue to monitor  throughout the day.     Therapeutic rest breaks utilized during the session to allow for continuation of ambulation attempt   TA- To improve functional movements patterns for everyday tasks    Doffed lite gait harness- LiteGait harness removed with patient in standing position. Due to increased abdominal pressure from harness, pt observed for dizziness post removal before transitioning to sitting for recovery   PATIENT EDUCATION: Education details: Pt educated on role of PT and services provided during current POC, along with prognosis and information about the clinic. Person educated: Patient and Spouse Education method: Explanation Education comprehension: verbalized understanding  HOME EXERCISE PROGRAM: Access Code: [email or web address] URL: [email or web address] Date: 05/14/2024 Prepared by: Peggye Linear  Exercises - Forward and backward step Right leg   - 1 x daily - 7 x weekly - 3 sets - 15 reps - Standing Hip Abduction with Counter Support  - 1 x daily - 7 x weekly - 2 sets - 10 reps - Mini Squat with Counter Support  - 1 x daily - 7 x weekly - 2 sets - 10 reps - Seated Hamstring Stretch  - 2 x daily - 1 sets - 2 reps -  45sec hold - Seated Knee Extension Stretch with Chair  - 2 x daily - 1 sets - 2 reps - 45sec hold - Supine Knee Extension Stretch on Towel Roll  - 2 x daily - 1 sets - 2 reps - 90 hold  GOALS: Goals reviewed with patient? Yes  SHORT TERM GOALS: Target date: 05/30/2024  Pt will be independent with HEP in order to demonstrate increased ability to perform tasks related to occupation/hobbies. Baseline: Goal status: INITIAL  LONG TERM GOALS: Target date: 07/25/2024  1.  Patient (> 71 years old) will complete five times sit to stand test in < 15 seconds indicating an increased LE strength and improved balance. Baseline: 11.36 sec 06/11/24: 12.73 sec 07/23/24: 13.15 sec Goal status: PROGRESSING   2.  Patient will reduce timed up and go to <11 seconds to  reduce fall risk and demonstrate improved transfer/gait ability. Baseline: 19.53 sec  10/29: 25.52 sec 07/23/24: 26.24 sec Goal status: PROGRESSING  3.  Patient will increase 10 meter walk test to >1.1m/s as to improve gait speed for better community ambulation and to reduce fall risk. Baseline: TBD 07/23/24: 20.34 sec Goal status: INITIAL  4.  Patient will increase six minute walk test distance to >1000 for progression to community ambulator and improve gait ability. Baseline: 112 feet in 2 min and 8 sec  07/23/24: 161 in 1 min 42 Goal status: INITIAL  5.  Pt will improve their Locomotor Capabilities Index (LCI-5) score by at least 7 points (MCID), demonstrating a clinically meaningful increase in ability to perform basic locomotor tasks with assistance and/or use of aids. Baseline: 06/11/24: 29 08/12/24: 37/56 Goal Status: INITIAL   ASSESSMENT:  CLINICAL IMPRESSION:   Pt responded fair to the exercises, however wants to push herself too much and has to be reminded by the therapist to allow for the healing of the tibial contusion.  Pt voiced understanding and stated she would monitor it at home.  Pt ultimately required a small amount of support to take the pressure off of the prosthetic/LE for pain modulation.  Pt advised to monitor the foot as well after it getting somewhat caught under the machine when ambulating.   Pt will continue to benefit from skilled therapy to address remaining deficits in order to improve overall QoL and return to PLOF.        OBJECTIVE IMPAIRMENTS: Abnormal gait, decreased activity tolerance, decreased balance, decreased endurance, decreased knowledge of use of DME, decreased mobility, difficulty walking, decreased ROM, decreased strength, prosthetic dependency , and pain.   ACTIVITY LIMITATIONS: carrying, lifting, bending, standing, squatting, stairs, transfers, bathing, toileting, dressing, hygiene/grooming, and caring for others  PARTICIPATION  LIMITATIONS: meal prep, cleaning, laundry, driving, shopping, community activity, occupation, and yard work  PERSONAL FACTORS: Age, Behavior pattern, Fitness, Past/current experiences, Profession, Time since onset of injury/illness/exacerbation, and 3+ comorbidities: anemia, breast cancer, HTN, PE's are also affecting patient's functional outcome.   REHAB POTENTIAL: Good  CLINICAL DECISION MAKING: Evolving/moderate complexity  EVALUATION COMPLEXITY: Moderate  PLAN:  PT FREQUENCY: 2x/week PT DURATION: 12 weeks PLANNED INTERVENTIONS: 97750- Physical Performance Testing, 97110-Therapeutic exercises, 97530- Therapeutic activity, 97112- Neuromuscular re-education, 97535- Self Care, [id number]- Manual therapy, [phone]- Gait training, [phone]- Orthotic/Prosthetic subsequent, Patient/Family education, Balance training, Stair training, Joint mobilization, and DME instructions  PLAN FOR NEXT SESSION:   -Continue slow progress to load bearing activity following tibial contusion 07/10/24.  -promote improved hip extension.     Fonda Simpers, PT, DPT Physical Therapist - [facility]  07/25/24, 5:46 PM

## 2024-07-30 ENCOUNTER — Ambulatory Visit

## 2024-08-01 ENCOUNTER — Ambulatory Visit

## 2024-08-01 DIAGNOSIS — M6281 Muscle weakness (generalized): Secondary | ICD-10-CM

## 2024-08-01 DIAGNOSIS — R262 Difficulty in walking, not elsewhere classified: Secondary | ICD-10-CM | POA: Diagnosis not present

## 2024-08-01 NOTE — Therapy (Signed)
 OUTPATIENT PHYSICAL THERAPY TREATMENT/RE-CERT  Patient Name: Penny Chase MRN: [id number] DOB:1970-11-16, 53 y.o., female Today's Date: 08/01/2024  PCP: [facility] REFERRING PROVIDER: Delores Orvin BRAVO, NP  END OF SESSION:   PT End of Session - 08/01/24 1308     Visit Number 22    Number of Visits 41    Date for Recertification  09/19/24    Authorization Type Aetna    Authorization Time Period Aetna [facility]    Progress Note Due on Visit 20    PT Start Time 1323    PT Stop Time 1401    PT Time Calculation (min) 38 min    Equipment Utilized During Treatment Gait belt    Activity Tolerance Patient tolerated treatment well;No increased pain    Behavior During Therapy [facility] for tasks assessed/performed          Past Medical History:  Diagnosis Date   Acute lower limb ischemia    Anemia    Breast cancer (HCC)    Cancer (HCC) 09/2018   Right breast   COVID-19 11/2018   Family history of breast cancer    GERD (gastroesophageal reflux disease)    Hypertension    Personal history of radiation therapy 2020   Rt breast   Pulmonary embolism [facility])    Past Surgical History:  Procedure Laterality Date   AMPUTATION Left 11/26/2023   Procedure: AMPUTATION BELOW KNEE;  Surgeon: Jama Cordella MATSU, MD;  Location: [facility] ORS;  Service: Vascular;  Laterality: Left;   BREAST BIOPSY Right 09/12/2018   us  core/venus clip-INVASIVE MAMMARYLobular CARCINOMA, WITH LOBULAR FEATURES   BREAST LUMPECTOMY Right 10/04/2018   BREAST LUMPECTOMY WITH NEEDLE LOCALIZATION AND AXILLARY SENTINEL LYMPH NODE BX Right 10/04/2018   Procedure: BREAST LUMPECTOMY WITH NEEDLE LOCALIZATION AND AXILLARY SENTINEL LYMPH NODE BX;  Surgeon: Rodolph Romano, MD;  Location: [facility] ORS;  Service: General;  Laterality: Right;   COLONOSCOPY N/A 11/28/2023   Procedure: COLONOSCOPY;  Surgeon: Jinny Carmine, MD;  Location: [facility] ENDOSCOPY;  Service: Endoscopy;  Laterality: N/A;   ESOPHAGOGASTRODUODENOSCOPY N/A  11/28/2023   Procedure: EGD (ESOPHAGOGASTRODUODENOSCOPY);  Surgeon: Jinny Carmine, MD;  Location: [facility] ENDOSCOPY;  Service: Endoscopy;  Laterality: N/A;   ESOPHAGOGASTRODUODENOSCOPY N/A 04/02/2024   Procedure: EGD (ESOPHAGOGASTRODUODENOSCOPY);  Surgeon: Therisa Bi, MD;  Location: [facility] ENDOSCOPY;  Service: Gastroenterology;  Laterality: N/A;   GIVENS CAPSULE STUDY N/A 11/28/2023   Procedure: IMAGING PROCEDURE, GI TRACT, INTRALUMINAL, VIA CAPSULE;  Surgeon: Jinny Carmine, MD;  Location: [facility] ENDOSCOPY;  Service: Endoscopy;  Laterality: N/A;   IVC FILTER INSERTION N/A 11/28/2023   Procedure: IVC FILTER INSERTION;  Surgeon: Jama Cordella MATSU, MD;  Location: [facility] INVASIVE CV LAB;  Service: Cardiovascular;  Laterality: N/A;   LOWER EXTREMITY ANGIOGRAPHY Left 11/24/2023   Procedure: Lower Extremity Angiography;  Surgeon: Jama Cordella MATSU, MD;  Location: [facility] INVASIVE CV LAB;  Service: Cardiovascular;  Laterality: Left;   TONSILLECTOMY     Patient Active Problem List   Diagnosis Date Noted   Lumbar spondylosis 03/08/2024   Complex regional pain syndrome type 2 of left lower extremity 03/08/2024   Atherosclerosis of native arteries of extremity with intermittent claudication 03/02/2024   Phantom limb syndrome with pain (HCC) 03/01/2024   Diverticulitis of colon 03/01/2024   Pain 02/13/2024   Hx of left BKA (HCC) 01/25/2024   Symptomatic anemia 12/01/2023   Multiple gastric ulcers 11/28/2023   Ischemic colitis 11/28/2023   Bloody stools 11/26/2023   Rectal bleeding 11/26/2023   Acute blood loss anemia  11/26/2023   Blister of left foot 11/24/2023   Limb ischemia 11/23/2023   Gangrene of left foot (HCC) 11/23/2023   Tobacco abuse 11/23/2023   PE (pulmonary thromboembolism) (HCC) 11/23/2023   GI bleeding 11/23/2023   Substance abuse (HCC) 11/23/2023   Leukocytosis 11/23/2023   Bipolar affective disorder, current episode manic (HCC) 01/06/2021   Brief psychotic disorder (HCC) 01/03/2021   Family  history of breast cancer    Carcinoma of upper-outer quadrant of right breast in female, estrogen receptor positive (HCC) 09/20/2018   Gastroesophageal reflux disease without esophagitis 01/04/2017   HTN (hypertension), benign 01/20/2014    ONSET DATE: 04/11/24  REFERRING DIAG: S10.487 (ICD-10-CM) - Hx of left BKA (HCC)   THERAPY DIAG:  Difficulty in walking, not elsewhere classified  Muscle weakness (generalized)  Rationale for Evaluation and Treatment: Rehabilitation  SUBJECTIVE:                                                                                                                                                                                             SUBJECTIVE STATEMENT:  Pt arrived today walking with FWW. She reports having some pain in the stump today due to overdoing it yesterday with increased activity. She rates her pain a 5/10 upon arrival. Pt reports no falls since previous visit and she has been extra careful.    PERTINENT HISTORY: Patient is a 53 year old female with acute limb ischemia s/p left BKA. PE in association with lower GI bleed s/p IVC filter.  PAIN:  Are you having pain? None  PRECAUTIONS: None WEIGHT BEARING RESTRICTIONS: WBAT FALLS: Has patient fallen in last 6 months? Yes. Number of falls 2  LIVING ENVIRONMENT: Lives with: lives with their spouse Lives in: House/apartment Stairs: Ramped entrance Has following equipment at home: Environmental Consultant - 2 wheeled, Wheelchair (manual), and Architectural Technologist  PLOF: Independent  PATIENT GOALS: Get out of the wheelchair and be able to get outside and do yard work that needs to be done.  OBJECTIVE:  Note: Objective measures were completed at Evaluation unless otherwise noted.  DIAGNOSTIC FINDINGS:  EXAM: MRI LUMBAR SPINE WITHOUT CONTRAST  IMPRESSION: Generally mild for age lumbar spine degeneration. No spinal stenosis or convincing neural impingement.   However, small left-side annular fissures of  the discs at L4-L5 and L5-S1, which might be a source for Left L5 and/or S1 radiculitis (respectively).   LOWER EXTREMITY MMT:    MMT Right Eval Left Eval  Hip flexion 4 5  Knee flexion 5 3+  Knee extension 5 3+  (Blank rows = not tested)  ROM Assessment Hip 07/16/24  Right  Left   ABDCT Supine  35  External rotation (90/90)  65  Internal Rotation (90/90)  35  Prone Extension ROM 25* -5 (pectineus pain)   External Rotation Prone  50  Internal Rotation Prone  30  Prone knee fleixon  60                                                                                                                                TREATMENT DATE: 08/01/2024   GT:  -Pt ambulated 300' with FWW CGA for safety.  -150' x2 without AD and CGA/min. A.    TA- To improve functional movements patterns for everyday tasks    -Sit to stand: 2x10 with 3 kg.  -Stairs: 4 laps working on reciprocal pattern.    -Supine hip extension stretch: L side 1'x4.     PATIENT EDUCATION: Education details: Pt educated on role of PT and services provided during current POC, along with prognosis and information about the clinic. Person educated: Patient and Spouse Education method: Explanation Education comprehension: verbalized understanding  HOME EXERCISE PROGRAM: Access Code: [email or web address] URL: [email or web address] Date: 05/14/2024 Prepared by: Peggye Linear  Exercises - Forward and backward step Right leg   - 1 x daily - 7 x weekly - 3 sets - 15 reps - Standing Hip Abduction with Counter Support  - 1 x daily - 7 x weekly - 2 sets - 10 reps - Mini Squat with Counter Support  - 1 x daily - 7 x weekly - 2 sets - 10 reps - Seated Hamstring Stretch  - 2 x daily - 1 sets - 2 reps - 45sec hold - Seated Knee Extension Stretch with Chair  - 2 x daily - 1 sets - 2 reps - 45sec hold - Supine Knee Extension Stretch on Towel Roll  - 2 x daily - 1 sets - 2 reps - 90 hold  GOALS: Goals reviewed with  patient? Yes  SHORT TERM GOALS: Target date: 05/30/2024  Pt will be independent with HEP in order to demonstrate increased ability to perform tasks related to occupation/hobbies. Baseline: Goal status: INITIAL  LONG TERM GOALS: Target date: 07/25/2024  1.  Patient (> 10 years old) will complete five times sit to stand test in < 15 seconds indicating an increased LE strength and improved balance. Baseline: 11.36 sec 06/11/24: 12.73 sec 07/23/24: 13.15 sec Goal status: PROGRESSING   2.  Patient will reduce timed up and go to <11 seconds to reduce fall risk and demonstrate improved transfer/gait ability. Baseline: 19.53 sec  10/29: 25.52 sec 07/23/24: 26.24 sec Goal status: PROGRESSING  3.  Patient will increase 10 meter walk test to >1.58m/s as to improve gait speed for better community ambulation and to reduce fall risk. Baseline: TBD 07/23/24: 20.34 sec Goal status: INITIAL  4.  Patient will increase six minute walk test distance to >1000 for progression to community ambulator and improve gait ability. Baseline:  112 feet in 2 min and 8 sec  07/23/24: 161 in 1 min 42 Goal status: INITIAL  5.  Pt will improve their Locomotor Capabilities Index (LCI-5) score by at least 7 points (MCID), demonstrating a clinically meaningful increase in ability to perform basic locomotor tasks with assistance and/or use of aids. Baseline: 06/11/24: 29 08/12/24: 37/56 Goal Status: INITIAL   ASSESSMENT:  CLINICAL IMPRESSION:   Pt warmed up with ambulating 2 laps around gym (300') with FWW. I adjusted her walker as she requested that it be lowered one level due to it causing pain in forearms. I also recommended she not lean on walker as much to reduce weight bearing through the arms. L hip extension stretch performed in supine for improved mobility and to assist with upright posture/hip extension during gait. Sit to stands and stairs were performed to assist with functional mobility at home with  increased safety. Patient was able to ambulate 2 separate trials of 150' today with CGA for safety. She continues to be highly motivated and responds well to verbal cues for positioning/posture and mechanics. Patient will benefit from continuing PT treatments at this time.       OBJECTIVE IMPAIRMENTS: Abnormal gait, decreased activity tolerance, decreased balance, decreased endurance, decreased knowledge of use of DME, decreased mobility, difficulty walking, decreased ROM, decreased strength, prosthetic dependency , and pain.   ACTIVITY LIMITATIONS: carrying, lifting, bending, standing, squatting, stairs, transfers, bathing, toileting, dressing, hygiene/grooming, and caring for others  PARTICIPATION LIMITATIONS: meal prep, cleaning, laundry, driving, shopping, community activity, occupation, and yard work  PERSONAL FACTORS: Age, Behavior pattern, Fitness, Past/current experiences, Profession, Time since onset of injury/illness/exacerbation, and 3+ comorbidities: anemia, breast cancer, HTN, PE's are also affecting patient's functional outcome.   REHAB POTENTIAL: Good  CLINICAL DECISION MAKING: Evolving/moderate complexity  EVALUATION COMPLEXITY: Moderate  PLAN:  PT FREQUENCY: 2x/week PT DURATION: 12 weeks PLANNED INTERVENTIONS: 97750- Physical Performance Testing, 97110-Therapeutic exercises, 97530- Therapeutic activity, 97112- Neuromuscular re-education, 97535- Self Care, 02859- Manual therapy, [phone]- Gait training, [phone]- Orthotic/Prosthetic subsequent, Patient/Family education, Balance training, Stair training, Joint mobilization, and DME instructions  PLAN FOR NEXT SESSION:   -Continue slow progress to load bearing activity following tibial contusion 07/10/24.  -promote improved hip extension.  -Improve independence/safety with gait.   Norman Sharps, PT, DPT Physical Therapist - [facility]  08/01/2024, 5:39 PM

## 2024-08-06 ENCOUNTER — Ambulatory Visit

## 2024-08-06 DIAGNOSIS — M6281 Muscle weakness (generalized): Secondary | ICD-10-CM

## 2024-08-06 DIAGNOSIS — R262 Difficulty in walking, not elsewhere classified: Secondary | ICD-10-CM | POA: Diagnosis not present

## 2024-08-06 NOTE — Therapy (Signed)
 " OUTPATIENT PHYSICAL THERAPY TREATMENT  Patient Name: Penny Chase MRN: [id number] DOB:03/19/1971, 53 y.o., female Today's Date: 08/06/2024  PCP: [facility] REFERRING PROVIDER: Delores Orvin BRAVO, NP  END OF SESSION:   PT End of Session - 08/06/24 1632     Visit Number 23    Number of Visits 41    Date for Recertification  09/19/24    Authorization Type Aetna    Authorization Time Period Aetna [facility]    Progress Note Due on Visit 20    PT Start Time 1628    PT Stop Time 1700    PT Time Calculation (min) 32 min    Equipment Utilized During Treatment Gait belt    Activity Tolerance Patient tolerated treatment well;No increased pain    Behavior During Therapy [facility] for tasks assessed/performed          Past Medical History:  Diagnosis Date   Acute lower limb ischemia    Anemia    Breast cancer (HCC)    Cancer (HCC) 09/2018   Right breast   COVID-19 11/2018   Family history of breast cancer    GERD (gastroesophageal reflux disease)    Hypertension    Personal history of radiation therapy 2020   Rt breast   Pulmonary embolism [facility])    Past Surgical History:  Procedure Laterality Date   AMPUTATION Left 11/26/2023   Procedure: AMPUTATION BELOW KNEE;  Surgeon: Jama Cordella MATSU, MD;  Location: [facility] ORS;  Service: Vascular;  Laterality: Left;   BREAST BIOPSY Right 09/12/2018   us  core/venus clip-INVASIVE MAMMARYLobular CARCINOMA, WITH LOBULAR FEATURES   BREAST LUMPECTOMY Right 10/04/2018   BREAST LUMPECTOMY WITH NEEDLE LOCALIZATION AND AXILLARY SENTINEL LYMPH NODE BX Right 10/04/2018   Procedure: BREAST LUMPECTOMY WITH NEEDLE LOCALIZATION AND AXILLARY SENTINEL LYMPH NODE BX;  Surgeon: Rodolph Romano, MD;  Location: [facility] ORS;  Service: General;  Laterality: Right;   COLONOSCOPY N/A 11/28/2023   Procedure: COLONOSCOPY;  Surgeon: Jinny Carmine, MD;  Location: [facility] ENDOSCOPY;  Service: Endoscopy;  Laterality: N/A;   ESOPHAGOGASTRODUODENOSCOPY N/A  11/28/2023   Procedure: EGD (ESOPHAGOGASTRODUODENOSCOPY);  Surgeon: Jinny Carmine, MD;  Location: [facility] ENDOSCOPY;  Service: Endoscopy;  Laterality: N/A;   ESOPHAGOGASTRODUODENOSCOPY N/A 04/02/2024   Procedure: EGD (ESOPHAGOGASTRODUODENOSCOPY);  Surgeon: Therisa Bi, MD;  Location: [facility] ENDOSCOPY;  Service: Gastroenterology;  Laterality: N/A;   GIVENS CAPSULE STUDY N/A 11/28/2023   Procedure: IMAGING PROCEDURE, GI TRACT, INTRALUMINAL, VIA CAPSULE;  Surgeon: Jinny Carmine, MD;  Location: [facility] ENDOSCOPY;  Service: Endoscopy;  Laterality: N/A;   IVC FILTER INSERTION N/A 11/28/2023   Procedure: IVC FILTER INSERTION;  Surgeon: Jama Cordella MATSU, MD;  Location: [facility] INVASIVE CV LAB;  Service: Cardiovascular;  Laterality: N/A;   LOWER EXTREMITY ANGIOGRAPHY Left 11/24/2023   Procedure: Lower Extremity Angiography;  Surgeon: Jama Cordella MATSU, MD;  Location: [facility] INVASIVE CV LAB;  Service: Cardiovascular;  Laterality: Left;   TONSILLECTOMY     Patient Active Problem List   Diagnosis Date Noted   Lumbar spondylosis 03/08/2024   Complex regional pain syndrome type 2 of left lower extremity 03/08/2024   Atherosclerosis of native arteries of extremity with intermittent claudication 03/02/2024   Phantom limb syndrome with pain (HCC) 03/01/2024   Diverticulitis of colon 03/01/2024   Pain 02/13/2024   Hx of left BKA (HCC) 01/25/2024   Symptomatic anemia 12/01/2023   Multiple gastric ulcers 11/28/2023   Ischemic colitis 11/28/2023   Bloody stools 11/26/2023   Rectal bleeding 11/26/2023   Acute blood loss  anemia 11/26/2023   Blister of left foot 11/24/2023   Limb ischemia 11/23/2023   Gangrene of left foot (HCC) 11/23/2023   Tobacco abuse 11/23/2023   PE (pulmonary thromboembolism) (HCC) 11/23/2023   GI bleeding 11/23/2023   Substance abuse (HCC) 11/23/2023   Leukocytosis 11/23/2023   Bipolar affective disorder, current episode manic (HCC) 01/06/2021   Brief psychotic disorder (HCC) 01/03/2021   Family  history of breast cancer    Carcinoma of upper-outer quadrant of right breast in female, estrogen receptor positive (HCC) 09/20/2018   Gastroesophageal reflux disease without esophagitis 01/04/2017   HTN (hypertension), benign 01/20/2014    ONSET DATE: 04/11/24  REFERRING DIAG: S10.487 (ICD-10-CM) - Hx of left BKA (HCC)   THERAPY DIAG:  Difficulty in walking, not elsewhere classified  Muscle weakness (generalized)  Rationale for Evaluation and Treatment: Rehabilitation  SUBJECTIVE:                                                                                                                                                                                             SUBJECTIVE STATEMENT: Pt doing well today, is tired in general. Pt reports stretches for hip are getting less uncomfortable at home.   PERTINENT HISTORY: Patient is a 53 year old female with acute limb ischemia s/p left BKA. PE in association with lower GI bleed s/p IVC filter.  PAIN:  Are you having pain? None  PRECAUTIONS: None WEIGHT BEARING RESTRICTIONS: WBAT FALLS: Has patient fallen in last 6 months? Yes. Number of falls 2  LIVING ENVIRONMENT: Lives with: lives with their spouse Lives in: House/apartment Stairs: Ramped entrance Has following equipment at home: Environmental Consultant - 2 wheeled, Wheelchair (manual), and Architectural Technologist  PLOF: Independent  PATIENT GOALS: Get out of the wheelchair and be able to get outside and do yard work that needs to be done.  OBJECTIVE:  Note: Objective measures were completed at Evaluation unless otherwise noted.  DIAGNOSTIC FINDINGS:  EXAM: MRI LUMBAR SPINE WITHOUT CONTRAST  IMPRESSION: Generally mild for age lumbar spine degeneration. No spinal stenosis or convincing neural impingement.   However, small left-side annular fissures of the discs at L4-L5 and L5-S1, which might be a source for Left L5 and/or S1 radiculitis (respectively).   LOWER EXTREMITY MMT:    MMT  Right Eval Left Eval Right  08/06/24 Left 08/06/24  Hip flexion 4 5 5/5 5/5  Knee flexion 5 3+ 5/5 4+/5  Knee extension 5 3+ 5/5 4+/5  (Blank rows = not tested)  ROM Assessment Hip 07/16/24    Right  Left   ABDCT Supine  35  External rotation (90/90)  65  Internal Rotation (90/90)  35  Prone Extension ROM 25* -5 (pectineus pain)   External Rotation Prone  50  Internal Rotation Prone  30  Prone knee fleixon  60                                                                                                                                TREATMENT DATE: 08/06/2024 -STS from chair x12, hands free, slow and focus on symmetry, cues for full rise, no LOB  -MMT BLE hip fleixon, knee flexion, knee extension  -Narrow stance firm surface eyes closed x60sec  -narrow stance foam surface x60sec,added horizontal head turns x24, then overhead glance x15 -wide tandem stance 3x30sec bilat (added to HEP) *explanation on how stretch of adductors will continue to facilitate ease of achieving a tandem stance position which eventually will transfer over to increased step length and fast gait speed.  -single side step, hands free 1x12, alternating sides, minGuard assist   PATIENT EDUCATION: Education details: Pt educated on role of PT and services provided during current POC, along with prognosis and information about the clinic. Person educated: Patient and Spouse Education method: Explanation Education comprehension: verbalized understanding  HOME EXERCISE PROGRAM: Access Code: [email or web address] URL: [email or web address] Date: 08/06/2024 Prepared by: Peggye Linear  Exercises - Forward and backward step Right leg   - 1 x daily - 7 x weekly - 3 sets - 15 reps - Standing Hip Abduction with Counter Support  - 1 x daily - 7 x weekly - 2 sets - 10 reps - Mini Squat with Counter Support  - 1 x daily - 7 x weekly - 2 sets - 10 reps - Seated Hamstring Stretch  - 2 x daily - 1 sets - 2 reps -  45sec hold - Seated Knee Extension Stretch with Chair  - 2 x daily - 1 sets - 2 reps - 45sec hold - Supine Knee Extension Stretch on Towel Roll  - 2 x daily - 1 sets - 2 reps - 90 hold - Prone Lying with Towel Roll (Hip Flexor Stretch) (BKA)  - 2 x daily - 1 sets - 1 reps - 66m hold - Stretching Adductors  - 2 x daily - 3 reps - 90 hold - Semi-Tandem Balance at Counter Top Eyes Open  - 1 x daily - 3 x weekly - 5 sets - 10 reps - 30 hold  GOALS: Goals reviewed with patient? Yes  SHORT TERM GOALS: Target date: 05/30/2024  Pt will be independent with HEP in order to demonstrate increased ability to perform tasks related to occupation/hobbies. Baseline: Goal status: INITIAL  LONG TERM GOALS: Target date: 07/25/2024  1.  Patient (> 52 years old) will complete five times sit to stand test in < 15 seconds indicating an increased LE strength and improved balance. Baseline: 11.36 sec 06/11/24: 12.73 sec 07/23/24: 13.15 sec Goal status: PROGRESSING   2.  Patient will reduce timed up and  go to <11 seconds to reduce fall risk and demonstrate improved transfer/gait ability. Baseline: 19.53 sec  10/29: 25.52 sec 07/23/24: 26.24 sec Goal status: PROGRESSING  3.  Patient will increase 10 meter walk test to >1.49m/s as to improve gait speed for better community ambulation and to reduce fall risk. Baseline: TBD 07/23/24: 20.34 sec Goal status: INITIAL  4.  Patient will increase six minute walk test distance to >1000 for progression to community ambulator and improve gait ability. Baseline: 112 feet in 2 min and 8 sec  07/23/24: 161 in 1 min 42 Goal status: INITIAL  5.  Pt will improve their Locomotor Capabilities Index (LCI-5) score by at least 7 points (MCID), demonstrating a clinically meaningful increase in ability to perform basic locomotor tasks with assistance and/or use of aids. Baseline: 06/11/24: 29 08/12/24: 37/56 Goal Status: INITIAL   ASSESSMENT:  CLINICAL IMPRESSION:  Revisited  MMT legs, notable improvement in bilat strength, testing tolerance. Returned to narrow stance balance practice to promote equal weight bearing, proprioception, and righting responses. Shown wide tandem balance for home, as pectineus conitnues to lengthen and improve hip extension is achieved. Pain remains at goal for pain in session. She continues to be highly motivated and responds well to verbal cues for positioning/posture and mechanics. Patient will benefit from continuing PT treatments at this time.    OBJECTIVE IMPAIRMENTS: Abnormal gait, decreased activity tolerance, decreased balance, decreased endurance, decreased knowledge of use of DME, decreased mobility, difficulty walking, decreased ROM, decreased strength, prosthetic dependency , and pain.   ACTIVITY LIMITATIONS: carrying, lifting, bending, standing, squatting, stairs, transfers, bathing, toileting, dressing, hygiene/grooming, and caring for others  PARTICIPATION LIMITATIONS: meal prep, cleaning, laundry, driving, shopping, community activity, occupation, and yard work  PERSONAL FACTORS: Age, Behavior pattern, Fitness, Past/current experiences, Profession, Time since onset of injury/illness/exacerbation, and 3+ comorbidities: anemia, breast cancer, HTN, PE's are also affecting patient's functional outcome.   REHAB POTENTIAL: Good  CLINICAL DECISION MAKING: Evolving/moderate complexity  EVALUATION COMPLEXITY: Moderate  PLAN:  PT FREQUENCY: 2x/week PT DURATION: 12 weeks PLANNED INTERVENTIONS: 97750- Physical Performance Testing, 97110-Therapeutic exercises, 97530- Therapeutic activity, 97112- Neuromuscular re-education, 97535- Self Care, 02859- Manual therapy, [phone]- Gait training, [phone]- Orthotic/Prosthetic subsequent, Patient/Family education, Balance training, Stair training, Joint mobilization, and DME instructions  PLAN FOR NEXT SESSION:  -Continue slow progress to load bearing activity following tibial contusion 07/10/24.   -promote improved hip extension.  -Improve independence/safety with gait. -backward and lateral stepping, hands free    5:21 PM, 08/06/2024 Peggye JAYSON Linear, PT, DPT Physical Therapist - [facility]  Outpatient Physical Therapy- Main [facility] [phone]      "

## 2024-08-08 ENCOUNTER — Ambulatory Visit: Admitting: Physical Therapy

## 2024-08-13 ENCOUNTER — Ambulatory Visit

## 2024-08-13 NOTE — Therapy (Incomplete)
 " OUTPATIENT PHYSICAL THERAPY TREATMENT  Patient Name: Penny Chase MRN: [id number] DOB:[birth year], 53 y.o., female Today's Date: 08/13/2024  PCP: [facility] REFERRING PROVIDER: Delores Orvin BRAVO, NP  END OF SESSION:     Past Medical History:  Diagnosis Date   Acute lower limb ischemia    Anemia    Breast cancer (HCC)    Cancer (HCC) 09/2018   Right breast   COVID-19 11/2018   Family history of breast cancer    GERD (gastroesophageal reflux disease)    Hypertension    Personal history of radiation therapy 2020   Rt breast   Pulmonary embolism (HCC)    Past Surgical History:  Procedure Laterality Date   AMPUTATION Left 11/26/2023   Procedure: AMPUTATION BELOW KNEE;  Surgeon: Jama Cordella MATSU, MD;  Location: [facility] ORS;  Service: Vascular;  Laterality: Left;   BREAST BIOPSY Right 09/12/2018   us  core/venus clip-INVASIVE MAMMARYLobular CARCINOMA, WITH LOBULAR FEATURES   BREAST LUMPECTOMY Right 10/04/2018   BREAST LUMPECTOMY WITH NEEDLE LOCALIZATION AND AXILLARY SENTINEL LYMPH NODE BX Right 10/04/2018   Procedure: BREAST LUMPECTOMY WITH NEEDLE LOCALIZATION AND AXILLARY SENTINEL LYMPH NODE BX;  Surgeon: Rodolph Romano, MD;  Location: [facility] ORS;  Service: General;  Laterality: Right;   COLONOSCOPY N/A 11/28/2023   Procedure: COLONOSCOPY;  Surgeon: Jinny Carmine, MD;  Location: [facility] ENDOSCOPY;  Service: Endoscopy;  Laterality: N/A;   ESOPHAGOGASTRODUODENOSCOPY N/A 11/28/2023   Procedure: EGD (ESOPHAGOGASTRODUODENOSCOPY);  Surgeon: Jinny Carmine, MD;  Location: [facility] ENDOSCOPY;  Service: Endoscopy;  Laterality: N/A;   ESOPHAGOGASTRODUODENOSCOPY N/A 04/02/2024   Procedure: EGD (ESOPHAGOGASTRODUODENOSCOPY);  Surgeon: Therisa Bi, MD;  Location: [facility] ENDOSCOPY;  Service: Gastroenterology;  Laterality: N/A;   GIVENS CAPSULE STUDY N/A 11/28/2023   Procedure: IMAGING PROCEDURE, GI TRACT, INTRALUMINAL, VIA CAPSULE;  Surgeon: Jinny Carmine, MD;  Location: [facility] ENDOSCOPY;   Service: Endoscopy;  Laterality: N/A;   IVC FILTER INSERTION N/A 11/28/2023   Procedure: IVC FILTER INSERTION;  Surgeon: Jama Cordella MATSU, MD;  Location: [facility] INVASIVE CV LAB;  Service: Cardiovascular;  Laterality: N/A;   LOWER EXTREMITY ANGIOGRAPHY Left 11/24/2023   Procedure: Lower Extremity Angiography;  Surgeon: Jama Cordella MATSU, MD;  Location: [facility] INVASIVE CV LAB;  Service: Cardiovascular;  Laterality: Left;   TONSILLECTOMY     Patient Active Problem List   Diagnosis Date Noted   Lumbar spondylosis 03/08/2024   Complex regional pain syndrome type 2 of left lower extremity 03/08/2024   Atherosclerosis of native arteries of extremity with intermittent claudication 03/02/2024   Phantom limb syndrome with pain (HCC) 03/01/2024   Diverticulitis of colon 03/01/2024   Pain 02/13/2024   Hx of left BKA (HCC) 01/25/2024   Symptomatic anemia 12/01/2023   Multiple gastric ulcers 11/28/2023   Ischemic colitis 11/28/2023   Bloody stools 11/26/2023   Rectal bleeding 11/26/2023   Acute blood loss anemia 11/26/2023   Blister of left foot 11/24/2023   Limb ischemia 11/23/2023   Gangrene of left foot (HCC) 11/23/2023   Tobacco abuse 11/23/2023   PE (pulmonary thromboembolism) (HCC) 11/23/2023   GI bleeding 11/23/2023   Substance abuse (HCC) 11/23/2023   Leukocytosis 11/23/2023   Bipolar affective disorder, current episode manic (HCC) 01/06/2021   Brief psychotic disorder (HCC) 01/03/2021   Family history of breast cancer    Carcinoma of upper-outer quadrant of right breast in female, estrogen receptor positive (HCC) 09/20/2018   Gastroesophageal reflux disease without esophagitis 01/04/2017   HTN (hypertension), benign 01/20/2014    ONSET DATE: 04/11/24  REFERRING  DIAG: S10.487 (ICD-10-CM) - Hx of left BKA (HCC)   THERAPY DIAG:  No diagnosis found.  Rationale for Evaluation and Treatment: Rehabilitation  SUBJECTIVE:                                                                                                                                                                                              SUBJECTIVE STATEMENT:  ***  PERTINENT HISTORY: Patient is a 53 year old female with acute limb ischemia s/p left BKA. PE in association with lower GI bleed s/p IVC filter.  PAIN:  Are you having pain? None  PRECAUTIONS: None WEIGHT BEARING RESTRICTIONS: WBAT FALLS: Has patient fallen in last 6 months? Yes. Number of falls 2  LIVING ENVIRONMENT: Lives with: lives with their spouse Lives in: House/apartment Stairs: Ramped entrance Has following equipment at home: Environmental Consultant - 2 wheeled, Wheelchair (manual), and Architectural Technologist  PLOF: Independent  PATIENT GOALS: Get out of the wheelchair and be able to get outside and do yard work that needs to be done.  OBJECTIVE:  Note: Objective measures were completed at Evaluation unless otherwise noted.  DIAGNOSTIC FINDINGS:  EXAM: MRI LUMBAR SPINE WITHOUT CONTRAST  IMPRESSION: Generally mild for age lumbar spine degeneration. No spinal stenosis or convincing neural impingement.   However, small left-side annular fissures of the discs at L4-L5 and L5-S1, which might be a source for Left L5 and/or S1 radiculitis (respectively).   LOWER EXTREMITY MMT:    MMT Right Eval Left Eval Right  08/06/24 Left 08/06/24  Hip flexion 4 5 5/5 5/5  Knee flexion 5 3+ 5/5 4+/5  Knee extension 5 3+ 5/5 4+/5  (Blank rows = not tested)  ROM Assessment Hip 07/16/24    Right  Left   ABDCT Supine  35  External rotation (90/90)  65  Internal Rotation (90/90)  35  Prone Extension ROM 25* -5 (pectineus pain)   External Rotation Prone  50  Internal Rotation Prone  30  Prone knee fleixon  60  TREATMENT DATE: 08/13/2024  *** -STS from chair x12, hands free, slow and focus on symmetry, cues for full rise, no LOB   -MMT BLE hip fleixon, knee flexion, knee extension  -Narrow stance firm surface eyes closed x60sec  -narrow stance foam surface x60sec,added horizontal head turns x24, then overhead glance x15 -wide tandem stance 3x30sec bilat (added to HEP) *explanation on how stretch of adductors will continue to facilitate ease of achieving a tandem stance position which eventually will transfer over to increased step length and fast gait speed.  -single side step, hands free 1x12, alternating sides, minGuard assist   PATIENT EDUCATION: Education details: Pt educated on role of PT and services provided during current POC, along with prognosis and information about the clinic. Person educated: Patient and Spouse Education method: Explanation Education comprehension: verbalized understanding  HOME EXERCISE PROGRAM: Access Code: [email or web address] URL: [email or web address] Date: 08/06/2024 Prepared by: Peggye Linear  Exercises - Forward and backward step Right leg   - 1 x daily - 7 x weekly - 3 sets - 15 reps - Standing Hip Abduction with Counter Support  - 1 x daily - 7 x weekly - 2 sets - 10 reps - Mini Squat with Counter Support  - 1 x daily - 7 x weekly - 2 sets - 10 reps - Seated Hamstring Stretch  - 2 x daily - 1 sets - 2 reps - 45sec hold - Seated Knee Extension Stretch with Chair  - 2 x daily - 1 sets - 2 reps - 45sec hold - Supine Knee Extension Stretch on Towel Roll  - 2 x daily - 1 sets - 2 reps - 90 hold - Prone Lying with Towel Roll (Hip Flexor Stretch) (BKA)  - 2 x daily - 1 sets - 1 reps - 63m hold - Stretching Adductors  - 2 x daily - 3 reps - 90 hold - Semi-Tandem Balance at Counter Top Eyes Open  - 1 x daily - 3 x weekly - 5 sets - 10 reps - 30 hold  GOALS: Goals reviewed with patient? Yes  SHORT TERM GOALS: Target date: 05/30/2024  Pt will be independent with HEP in order to demonstrate increased ability to perform tasks related to occupation/hobbies. Baseline: Goal  status: INITIAL  LONG TERM GOALS: Target date: 07/25/2024  1.  Patient (> 49 years old) will complete five times sit to stand test in < 15 seconds indicating an increased LE strength and improved balance. Baseline: 11.36 sec 06/11/24: 12.73 sec 07/23/24: 13.15 sec Goal status: PROGRESSING   2.  Patient will reduce timed up and go to <11 seconds to reduce fall risk and demonstrate improved transfer/gait ability. Baseline: 19.53 sec  10/29: 25.52 sec 07/23/24: 26.24 sec Goal status: PROGRESSING  3.  Patient will increase 10 meter walk test to >1.56m/s as to improve gait speed for better community ambulation and to reduce fall risk. Baseline: TBD 07/23/24: 20.34 sec Goal status: INITIAL  4.  Patient will increase six minute walk test distance to >1000 for progression to community ambulator and improve gait ability. Baseline: 112 feet in 2 min and 8 sec  07/23/24: 161 in 1 min 42 Goal status: INITIAL  5.  Pt will improve their Locomotor Capabilities Index (LCI-5) score by at least 7 points (MCID), demonstrating a clinically meaningful increase in ability to perform basic locomotor tasks with assistance and/or use of aids. Baseline: 06/11/24: 29 08/12/24: 37/56 Goal Status: INITIAL   ASSESSMENT:  CLINICAL IMPRESSION:   ***  OBJECTIVE IMPAIRMENTS: Abnormal gait, decreased activity tolerance, decreased balance, decreased endurance, decreased knowledge of use of DME, decreased mobility, difficulty walking, decreased ROM, decreased strength, prosthetic dependency , and pain.   ACTIVITY LIMITATIONS: carrying, lifting, bending, standing, squatting, stairs, transfers, bathing, toileting, dressing, hygiene/grooming, and caring for others  PARTICIPATION LIMITATIONS: meal prep, cleaning, laundry, driving, shopping, community activity, occupation, and yard work  PERSONAL FACTORS: Age, Behavior pattern, Fitness, Past/current experiences, Profession, Time since onset of  injury/illness/exacerbation, and 3+ comorbidities: anemia, breast cancer, HTN, PE's are also affecting patient's functional outcome.   REHAB POTENTIAL: Good  CLINICAL DECISION MAKING: Evolving/moderate complexity  EVALUATION COMPLEXITY: Moderate  PLAN:  PT FREQUENCY: 2x/week PT DURATION: 12 weeks PLANNED INTERVENTIONS: 97750- Physical Performance Testing, 97110-Therapeutic exercises, 97530- Therapeutic activity, [id number]- Neuromuscular re-education, 97535- Self Care, [id number]- Manual therapy, [phone]- Gait training, [phone]- Orthotic/Prosthetic subsequent, Patient/Family education, Balance training, Stair training, Joint mobilization, and DME instructions  PLAN FOR NEXT SESSION:   *** -Continue slow progress to load bearing activity following tibial contusion 07/10/24.  -promote improved hip extension.  -Improve independence/safety with gait. -backward and lateral stepping, hands free    Fonda Simpers, PT, DPT Physical Therapist - [place]  [facility]  08/13/2024, 9:25 AM   "

## 2024-08-15 ENCOUNTER — Ambulatory Visit

## 2024-08-20 ENCOUNTER — Ambulatory Visit: Attending: Nurse Practitioner

## 2024-08-20 DIAGNOSIS — M6281 Muscle weakness (generalized): Secondary | ICD-10-CM | POA: Insufficient documentation

## 2024-08-20 DIAGNOSIS — R262 Difficulty in walking, not elsewhere classified: Secondary | ICD-10-CM | POA: Diagnosis present

## 2024-08-20 NOTE — Therapy (Signed)
 " OUTPATIENT PHYSICAL THERAPY TREATMENT  Patient Name: Penny Chase MRN: [id number] DOB:27-Jul-1971, 54 y.o., female Today's Date: 08/20/2024  PCP: [facility] REFERRING PROVIDER: Delores Orvin BRAVO, NP  END OF SESSION:   PT End of Session - 08/20/24 1622     Visit Number 24    Number of Visits 41    Date for Recertification  09/19/24    Authorization Type [employer]    Authorization Time Period Aetna [facility]    Progress Note Due on Visit 30    PT Start Time 1619    PT Stop Time 1659    PT Time Calculation (min) 40 min    Equipment Utilized During Treatment Gait belt    Activity Tolerance Patient tolerated treatment well;No increased pain    Behavior During Therapy [facility] for tasks assessed/performed           Past Medical History:  Diagnosis Date   Acute lower limb ischemia    Anemia    Breast cancer (HCC)    Cancer (HCC) 09/2018   Right breast   COVID-19 11/2018   Family history of breast cancer    GERD (gastroesophageal reflux disease)    Hypertension    Personal history of radiation therapy 2020   Rt breast   Pulmonary embolism [facility])    Past Surgical History:  Procedure Laterality Date   AMPUTATION Left 11/26/2023   Procedure: AMPUTATION BELOW KNEE;  Surgeon: Jama Cordella MATSU, MD;  Location: [facility] ORS;  Service: Vascular;  Laterality: Left;   BREAST BIOPSY Right 09/12/2018   us  core/venus clip-INVASIVE MAMMARYLobular CARCINOMA, WITH LOBULAR FEATURES   BREAST LUMPECTOMY Right 10/04/2018   BREAST LUMPECTOMY WITH NEEDLE LOCALIZATION AND AXILLARY SENTINEL LYMPH NODE BX Right 10/04/2018   Procedure: BREAST LUMPECTOMY WITH NEEDLE LOCALIZATION AND AXILLARY SENTINEL LYMPH NODE BX;  Surgeon: Rodolph Romano, MD;  Location: [facility] ORS;  Service: General;  Laterality: Right;   COLONOSCOPY N/A 11/28/2023   Procedure: COLONOSCOPY;  Surgeon: Jinny Carmine, MD;  Location: [facility] ENDOSCOPY;  Service: Endoscopy;  Laterality: N/A;   ESOPHAGOGASTRODUODENOSCOPY N/A  11/28/2023   Procedure: EGD (ESOPHAGOGASTRODUODENOSCOPY);  Surgeon: Jinny Carmine, MD;  Location[facility] ENDOSCOPY;  Service: Endoscopy;  Laterality: N/A;   ESOPHAGOGASTRODUODENOSCOPY N/A 04/02/2024   Procedure: EGD (ESOPHAGOGASTRODUODENOSCOPY);  Surgeon: Therisa Bi, MD;  Location: [facility] ENDOSCOPY;  Service: Gastroenterology;  Laterality: N/A;   GIVENS CAPSULE STUDY N/A 11/28/2023   Procedure: IMAGING PROCEDURE, GI TRACT, INTRALUMINAL, VIA CAPSULE;  Surgeon: Jinny Carmine, MD;  Location: [facility] ENDOSCOPY;  Service: Endoscopy;  Laterality: N/A;   IVC FILTER INSERTION N/A 11/28/2023   Procedure: IVC FILTER INSERTION;  Surgeon: Jama Cordella MATSU, MD;  Location: [facility] INVASIVE CV LAB;  Service: Cardiovascular;  Laterality: N/A;   LOWER EXTREMITY ANGIOGRAPHY Left 11/24/2023   Procedure: Lower Extremity Angiography;  Surgeon: Jama Cordella MATSU, MD;  Location: [facility] INVASIVE CV LAB;  Service: Cardiovascular;  Laterality: Left;   TONSILLECTOMY     Patient Active Problem List   Diagnosis Date Noted   Lumbar spondylosis 03/08/2024   Complex regional pain syndrome type 2 of left lower extremity 03/08/2024   Atherosclerosis of native arteries of extremity with intermittent claudication 03/02/2024   Phantom limb syndrome with pain (HCC) 03/01/2024   Diverticulitis of colon 03/01/2024   Pain 02/13/2024   Hx of left BKA (HCC) 01/25/2024   Symptomatic anemia 12/01/2023   Multiple gastric ulcers 11/28/2023   Ischemic colitis 11/28/2023   Bloody stools 11/26/2023   Rectal bleeding 11/26/2023   Acute blood  loss anemia 11/26/2023   Blister of left foot 11/24/2023   Limb ischemia 11/23/2023   Gangrene of left foot (HCC) 11/23/2023   Tobacco abuse 11/23/2023   PE (pulmonary thromboembolism) (HCC) 11/23/2023   GI bleeding 11/23/2023   Substance abuse (HCC) 11/23/2023   Leukocytosis 11/23/2023   Bipolar affective disorder, current episode manic (HCC) 01/06/2021   Brief psychotic disorder (HCC) 01/03/2021   Family  history of breast cancer    Carcinoma of upper-outer quadrant of right breast in female, estrogen receptor positive (HCC) 09/20/2018   Gastroesophageal reflux disease without esophagitis 01/04/2017   HTN (hypertension), benign 01/20/2014    ONSET DATE: 04/11/24  REFERRING DIAG: S10.487 (ICD-10-CM) - Hx of left BKA (HCC)   THERAPY DIAG:  Difficulty in walking, not elsewhere classified  Muscle weakness (generalized)  Rationale for Evaluation and Treatment: Rehabilitation  SUBJECTIVE:                                                                                                                                                                                             SUBJECTIVE STATEMENT: Pt reports she has been trying to walk more without her walker at home, has even taken some steps in the yard without DME without falling. Left knee and hip pain have been elevated.   PERTINENT HISTORY: Patient is a 55 year old female with acute limb ischemia s/p left BKA. PE in association with lower GI bleed s/p IVC filter.  PAIN:  Are you having pain? None  PRECAUTIONS: None WEIGHT BEARING RESTRICTIONS: WBAT FALLS: Has patient fallen in last 6 months? Yes. Number of falls 2  LIVING ENVIRONMENT: Lives with: lives with their spouse Lives in: House/apartment Stairs: Ramped entrance Has following equipment at home: Environmental Consultant - 2 wheeled, Wheelchair (manual), and Architectural Technologist  PLOF: Independent  PATIENT GOALS: Get out of the wheelchair and be able to get outside and do yard work that needs to be done.  OBJECTIVE:  Note: Objective measures were completed at Evaluation unless otherwise noted.  DIAGNOSTIC FINDINGS:  EXAM: MRI LUMBAR SPINE WITHOUT CONTRAST  IMPRESSION: Generally mild for age lumbar spine degeneration. No spinal stenosis or convincing neural impingement.   However, small left-side annular fissures of the discs at L4-L5 and L5-S1, which might be a source for Left L5 and/or S1  radiculitis (respectively).   LOWER EXTREMITY MMT:    MMT Right Eval Left Eval Right  08/06/24 Left 08/06/24  Hip flexion 4 5 5/5 5/5  Knee flexion 5 3+ 5/5 4+/5  Knee extension 5 3+ 5/5 4+/5  (Blank rows = not tested)  ROM Assessment Hip 07/16/24  Right  Left   ABDCT Supine  35  External rotation (90/90)  65  Internal Rotation (90/90)  35  Prone Extension ROM 25* -5 (pectineus pain)   External Rotation Prone  50  Internal Rotation Prone  30  Prone knee fleixon  60                                                                                                                                TREATMENT DATE: 08/20/2024 -AMB into clinic carrying personal RW -AMB overground 476ft c 4WW: 54m46sec -STS from chair x15 c 5kg ball -normal stance on foam pad: eyes closed 15x3secH  -wide tandem 4x30sec hold bilat (2x4 width)  *knee feeling a little cranky - side stepping in // bars 3x59ft bilat, hands free (cues for small steps)  - brief LLE SLS, unable, too fatigued at this point.  -AMB out of clinic using personal RW    PATIENT EDUCATION: Education details: Pt educated on role of PT and services provided during current POC, along with prognosis and information about the clinic. Person educated: Patient and Spouse Education method: Explanation Education comprehension: verbalized understanding  HOME EXERCISE PROGRAM: Access Code: [email or web address] URL: [email or web address] Date: 08/06/2024 Prepared by: Peggye Linear  Exercises - Forward and backward step Right leg   - 1 x daily - 7 x weekly - 3 sets - 15 reps - Standing Hip Abduction with Counter Support  - 1 x daily - 7 x weekly - 2 sets - 10 reps - Mini Squat with Counter Support  - 1 x daily - 7 x weekly - 2 sets - 10 reps - Seated Hamstring Stretch  - 2 x daily - 1 sets - 2 reps - 45sec hold - Seated Knee Extension Stretch with Chair  - 2 x daily - 1 sets - 2 reps - 45sec hold - Supine Knee Extension Stretch on  Towel Roll  - 2 x daily - 1 sets - 2 reps - 90 hold - Prone Lying with Towel Roll (Hip Flexor Stretch) (BKA)  - 2 x daily - 1 sets - 1 reps - 8m hold - Stretching Adductors  - 2 x daily - 3 reps - 90 hold - Semi-Tandem Balance at Counter Top Eyes Open  - 1 x daily - 3 x weekly - 5 sets - 10 reps - 30 hold  GOALS: Goals reviewed with patient? Yes  SHORT TERM GOALS: Target date: 05/30/2024  Pt will be independent with HEP in order to demonstrate increased ability to perform tasks related to occupation/hobbies. Baseline: Goal status: INITIAL  LONG TERM GOALS: Target date: 07/25/2024  1.  Patient (> 25 years old) will complete five times sit to stand test in < 15 seconds indicating an increased LE strength and improved balance. Baseline: 11.36 sec 06/11/24: 12.73 sec 07/23/24: 13.15 sec Goal status: PROGRESSING   2.  Patient will reduce timed up and go to <11 seconds to reduce  fall risk and demonstrate improved transfer/gait ability. Baseline: 19.53 sec  10/29: 25.52 sec 07/23/24: 26.24 sec Goal status: PROGRESSING  3.  Patient will increase 10 meter walk test to >1.68m/s as to improve gait speed for better community ambulation and to reduce fall risk. Baseline: TBD 07/23/24: 20.34 sec Goal status: INITIAL  4.  Patient will increase six minute walk test distance to >1000 for progression to community ambulator and improve gait ability. Baseline: 112 feet in 2 min and 8 sec  07/23/24: 161 in 1 min 42 Goal status: INITIAL  5.  Pt will improve their Locomotor Capabilities Index (LCI-5) score by at least 7 points (MCID), demonstrating a clinically meaningful increase in ability to perform basic locomotor tasks with assistance and/or use of aids. Baseline: 06/11/24: 29 08/12/24: 37/56 Goal Status: INITIAL   ASSESSMENT:  CLINICAL IMPRESSION:  Pt conitnued to show improvement in tolerance to limb loading, has been steadily progressing AMB activity without device at home, albeit pt has  insufficient hip ABDCT strength, hence high activity levels hae been aggravating hip and also knee more. Pt aware that RW still appropriate for longer distances. Some increased pain by end of session, but pt remains very motivated to perform her best inspite of elevated pain. Patient will benefit from skilled physical therapy intervention to reduce deficits and impairments identified in evaluation, in order to reduce pain, improve quality of life, and maximize activity tolerance for ADL, IADL, and leisure/fitness. Physical therapy will help pt achieve long and short term goals of care.    OBJECTIVE IMPAIRMENTS: Abnormal gait, decreased activity tolerance, decreased balance, decreased endurance, decreased knowledge of use of DME, decreased mobility, difficulty walking, decreased ROM, decreased strength, prosthetic dependency , and pain.   ACTIVITY LIMITATIONS: carrying, lifting, bending, standing, squatting, stairs, transfers, bathing, toileting, dressing, hygiene/grooming, and caring for others  PARTICIPATION LIMITATIONS: meal prep, cleaning, laundry, driving, shopping, community activity, occupation, and yard work  PERSONAL FACTORS: Age, Behavior pattern, Fitness, Past/current experiences, Profession, Time since onset of injury/illness/exacerbation, and 3+ comorbidities: anemia, breast cancer, HTN, PE's are also affecting patient's functional outcome.   REHAB POTENTIAL: Good  CLINICAL DECISION MAKING: Evolving/moderate complexity  EVALUATION COMPLEXITY: Moderate  PLAN:  PT FREQUENCY: 2x/week PT DURATION: 12 weeks PLANNED INTERVENTIONS: 97750- Physical Performance Testing, 97110-Therapeutic exercises, 97530- Therapeutic activity, 97112- Neuromuscular re-education, 97535- Self Care, 02859- Manual therapy, [phone]- Gait training, [phone]- Orthotic/Prosthetic subsequent, Patient/Family education, Balance training, Stair training, Joint mobilization, and DME instructions  PLAN FOR NEXT SESSION:   -Continue slow progress to load bearing activity following tibial contusion 07/10/24.  -promote improved hip extension.  -Improve independence/safety with gait. -backward and lateral stepping, hands free   4:57 PM, 08/20/2024 Peggye JAYSON Linear, PT, DPT Physical Therapist - [facility]  Outpatient Physical Therapy- Main Campus [phone]      "

## 2024-08-22 ENCOUNTER — Ambulatory Visit (INDEPENDENT_AMBULATORY_CARE_PROVIDER_SITE_OTHER): Admitting: Nurse Practitioner

## 2024-08-22 ENCOUNTER — Encounter (INDEPENDENT_AMBULATORY_CARE_PROVIDER_SITE_OTHER)

## 2024-08-22 ENCOUNTER — Ambulatory Visit

## 2024-08-22 NOTE — Therapy (Deleted)
 " OUTPATIENT PHYSICAL THERAPY TREATMENT  Patient Name: Penny Chase MRN: [id number] DOB:Oct 20, 1970, 54 y.o., female Today's Date: 08/22/2024  PCP: [facility] REFERRING PROVIDER: Delores Orvin BRAVO, NP  END OF SESSION:      Past Medical History:  Diagnosis Date   Acute lower limb ischemia    Anemia    Breast cancer (HCC)    Cancer (HCC) 09/2018   Right breast   COVID-19 11/2018   Family history of breast cancer    GERD (gastroesophageal reflux disease)    Hypertension    Personal history of radiation therapy 2020   Rt breast   Pulmonary embolism (HCC)    Past Surgical History:  Procedure Laterality Date   AMPUTATION Left 11/26/2023   Procedure: AMPUTATION BELOW KNEE;  Surgeon: Jama Cordella MATSU, MD;  Location: [facility] ORS;  Service: Vascular;  Laterality: Left;   BREAST BIOPSY Right 09/12/2018   us  core/venus clip-INVASIVE MAMMARYLobular CARCINOMA, WITH LOBULAR FEATURES   BREAST LUMPECTOMY Right 10/04/2018   BREAST LUMPECTOMY WITH NEEDLE LOCALIZATION AND AXILLARY SENTINEL LYMPH NODE BX Right 10/04/2018   Procedure: BREAST LUMPECTOMY WITH NEEDLE LOCALIZATION AND AXILLARY SENTINEL LYMPH NODE BX;  Surgeon: Rodolph Romano, MD;  Location: [facility] ORS;  Service: General;  Laterality: Right;   COLONOSCOPY N/A 11/28/2023   Procedure: COLONOSCOPY;  Surgeon: Jinny Carmine, MD;  Location: [facility] ENDOSCOPY;  Service: Endoscopy;  Laterality: N/A;   ESOPHAGOGASTRODUODENOSCOPY N/A 11/28/2023   Procedure: EGD (ESOPHAGOGASTRODUODENOSCOPY);  Surgeon: Jinny Carmine, MD;  Location[facility] ENDOSCOPY;  Service: Endoscopy;  Laterality: N/A;   ESOPHAGOGASTRODUODENOSCOPY N/A 04/02/2024   Procedure: EGD (ESOPHAGOGASTRODUODENOSCOPY);  Surgeon: Therisa Bi, MD;  Location: [facility] ENDOSCOPY;  Service: Gastroenterology;  Laterality: N/A;   GIVENS CAPSULE STUDY N/A 11/28/2023   Procedure: IMAGING PROCEDURE, GI TRACT, INTRALUMINAL, VIA CAPSULE;  Surgeon: Jinny Carmine, MD;  Location: [facility] ENDOSCOPY;   Service: Endoscopy;  Laterality: N/A;   IVC FILTER INSERTION N/A 11/28/2023   Procedure: IVC FILTER INSERTION;  Surgeon: Jama Cordella MATSU, MD;  Location: [facility] INVASIVE CV LAB;  Service: Cardiovascular;  Laterality: N/A;   LOWER EXTREMITY ANGIOGRAPHY Left 11/24/2023   Procedure: Lower Extremity Angiography;  Surgeon: Jama Cordella MATSU, MD;  Location: [facility] INVASIVE CV LAB;  Service: Cardiovascular;  Laterality: Left;   TONSILLECTOMY     Patient Active Problem List   Diagnosis Date Noted   Lumbar spondylosis 03/08/2024   Complex regional pain syndrome type 2 of left lower extremity 03/08/2024   Atherosclerosis of native arteries of extremity with intermittent claudication 03/02/2024   Phantom limb syndrome with pain (HCC) 03/01/2024   Diverticulitis of colon 03/01/2024   Pain 02/13/2024   Hx of left BKA (HCC) 01/25/2024   Symptomatic anemia 12/01/2023   Multiple gastric ulcers 11/28/2023   Ischemic colitis 11/28/2023   Bloody stools 11/26/2023   Rectal bleeding 11/26/2023   Acute blood loss anemia 11/26/2023   Blister of left foot 11/24/2023   Limb ischemia 11/23/2023   Gangrene of left foot (HCC) 11/23/2023   Tobacco abuse 11/23/2023   PE (pulmonary thromboembolism) (HCC) 11/23/2023   GI bleeding 11/23/2023   Substance abuse (HCC) 11/23/2023   Leukocytosis 11/23/2023   Bipolar affective disorder, current episode manic (HCC) 01/06/2021   Brief psychotic disorder (HCC) 01/03/2021   Family history of breast cancer    Carcinoma of upper-outer quadrant of right breast in female, estrogen receptor positive (HCC) 09/20/2018   Gastroesophageal reflux disease without esophagitis 01/04/2017   HTN (hypertension), benign 01/20/2014    ONSET DATE: 04/11/24  REFERRING DIAG: S10.487 (ICD-10-CM) - Hx of left BKA (HCC)   THERAPY DIAG:  Difficulty in walking, not elsewhere classified  Muscle weakness (generalized)  Rationale for Evaluation and Treatment: Rehabilitation  SUBJECTIVE:                                                                                                                                                                                              SUBJECTIVE STATEMENT: Pt reports she has been trying to walk more without her walker at home, has even taken some steps in the yard without DME without falling. Left knee and hip pain have been elevated.   PERTINENT HISTORY: Patient is a 54 year old female with acute limb ischemia s/p left BKA. PE in association with lower GI bleed s/p IVC filter.  PAIN:  Are you having pain? None  PRECAUTIONS: None WEIGHT BEARING RESTRICTIONS: WBAT FALLS: Has patient fallen in last 6 months? Yes. Number of falls 2  LIVING ENVIRONMENT: Lives with: lives with their spouse Lives in: House/apartment Stairs: Ramped entrance Has following equipment at home: Environmental Consultant - 2 wheeled, Wheelchair (manual), and Architectural Technologist  PLOF: Independent  PATIENT GOALS: Get out of the wheelchair and be able to get outside and do yard work that needs to be done.  OBJECTIVE:  Note: Objective measures were completed at Evaluation unless otherwise noted.  DIAGNOSTIC FINDINGS:  EXAM: MRI LUMBAR SPINE WITHOUT CONTRAST  IMPRESSION: Generally mild for age lumbar spine degeneration. No spinal stenosis or convincing neural impingement.   However, small left-side annular fissures of the discs at L4-L5 and L5-S1, which might be a source for Left L5 and/or S1 radiculitis (respectively).   LOWER EXTREMITY MMT:    MMT Right Eval Left Eval Right  08/06/24 Left 08/06/24  Hip flexion 4 5 5/5 5/5  Knee flexion 5 3+ 5/5 4+/5  Knee extension 5 3+ 5/5 4+/5  (Blank rows = not tested)  ROM Assessment Hip 07/16/24    Right  Left   ABDCT Supine  35  External rotation (90/90)  65  Internal Rotation (90/90)  35  Prone Extension ROM 25* -5 (pectineus pain)   External Rotation Prone  50  Internal Rotation Prone  30  Prone knee fleixon  60  TREATMENT DATE: 08/22/2024 -AMB into clinic carrying personal RW -AMB overground 464ft c 4WW: 35m46sec -STS from chair x15 c 5kg ball -normal stance on foam pad: eyes closed 15x3secH  -wide tandem 4x30sec hold bilat (2x4 width)  *knee feeling a little cranky - side stepping in // bars 3x22ft bilat, hands free (cues for small steps)  - brief LLE SLS, unable, too fatigued at this point.  -AMB out of clinic using personal RW    PATIENT EDUCATION: Education details: Pt educated on role of PT and services provided during current POC, along with prognosis and information about the clinic. Person educated: Patient and Spouse Education method: Explanation Education comprehension: verbalized understanding  HOME EXERCISE PROGRAM: Access Code: [email or web address] URL: [email or web address] Date: 08/06/2024 Prepared by: Peggye Linear  Exercises - Forward and backward step Right leg   - 1 x daily - 7 x weekly - 3 sets - 15 reps - Standing Hip Abduction with Counter Support  - 1 x daily - 7 x weekly - 2 sets - 10 reps - Mini Squat with Counter Support  - 1 x daily - 7 x weekly - 2 sets - 10 reps - Seated Hamstring Stretch  - 2 x daily - 1 sets - 2 reps - 45sec hold - Seated Knee Extension Stretch with Chair  - 2 x daily - 1 sets - 2 reps - 45sec hold - Supine Knee Extension Stretch on Towel Roll  - 2 x daily - 1 sets - 2 reps - 90 hold - Prone Lying with Towel Roll (Hip Flexor Stretch) (BKA)  - 2 x daily - 1 sets - 1 reps - 27m hold - Stretching Adductors  - 2 x daily - 3 reps - 90 hold - Semi-Tandem Balance at Counter Top Eyes Open  - 1 x daily - 3 x weekly - 5 sets - 10 reps - 30 hold  GOALS: Goals reviewed with patient? Yes  SHORT TERM GOALS: Target date: 05/30/2024  Pt will be independent with HEP in order to demonstrate increased ability to perform tasks related to  occupation/hobbies. Baseline: Goal status: INITIAL  LONG TERM GOALS: Target date: 07/25/2024  1.  Patient (> 29 years old) will complete five times sit to stand test in < 15 seconds indicating an increased LE strength and improved balance. Baseline: 11.36 sec 06/11/24: 12.73 sec 07/23/24: 13.15 sec Goal status: PROGRESSING   2.  Patient will reduce timed up and go to <11 seconds to reduce fall risk and demonstrate improved transfer/gait ability. Baseline: 19.53 sec  10/29: 25.52 sec 07/23/24: 26.24 sec Goal status: PROGRESSING  3.  Patient will increase 10 meter walk test to >1.12m/s as to improve gait speed for better community ambulation and to reduce fall risk. Baseline: TBD 07/23/24: 20.34 sec Goal status: INITIAL  4.  Patient will increase six minute walk test distance to >1000 for progression to community ambulator and improve gait ability. Baseline: 112 feet in 2 min and 8 sec  07/23/24: 161 in 1 min 42 Goal status: INITIAL  5.  Pt will improve their Locomotor Capabilities Index (LCI-5) score by at least 7 points (MCID), demonstrating a clinically meaningful increase in ability to perform basic locomotor tasks with assistance and/or use of aids. Baseline: 06/11/24: 29 08/12/24: 37/56 Goal Status: INITIAL   ASSESSMENT:  CLINICAL IMPRESSION:  Pt conitnued to show improvement in tolerance to limb loading, has been steadily progressing AMB activity without device at home, albeit pt has insufficient hip ABDCT strength, hence high  activity levels hae been aggravating hip and also knee more. Pt aware that RW still appropriate for longer distances. Some increased pain by end of session, but pt remains very motivated to perform her best inspite of elevated pain. Patient will benefit from skilled physical therapy intervention to reduce deficits and impairments identified in evaluation, in order to reduce pain, improve quality of life, and maximize activity tolerance for ADL, IADL, and  leisure/fitness. Physical therapy will help pt achieve long and short term goals of care.    OBJECTIVE IMPAIRMENTS: Abnormal gait, decreased activity tolerance, decreased balance, decreased endurance, decreased knowledge of use of DME, decreased mobility, difficulty walking, decreased ROM, decreased strength, prosthetic dependency , and pain.   ACTIVITY LIMITATIONS: carrying, lifting, bending, standing, squatting, stairs, transfers, bathing, toileting, dressing, hygiene/grooming, and caring for others  PARTICIPATION LIMITATIONS: meal prep, cleaning, laundry, driving, shopping, community activity, occupation, and yard work  PERSONAL FACTORS: Age, Behavior pattern, Fitness, Past/current experiences, Profession, Time since onset of injury/illness/exacerbation, and 3+ comorbidities: anemia, breast cancer, HTN, PE's are also affecting patient's functional outcome.   REHAB POTENTIAL: Good  CLINICAL DECISION MAKING: Evolving/moderate complexity  EVALUATION COMPLEXITY: Moderate  PLAN:  PT FREQUENCY: 2x/week PT DURATION: 12 weeks PLANNED INTERVENTIONS: 97750- Physical Performance Testing, 97110-Therapeutic exercises, 97530- Therapeutic activity, 97112- Neuromuscular re-education, 97535- Self Care, [id number]- Manual therapy, [phone]- Gait training, [phone]- Orthotic/Prosthetic subsequent, Patient/Family education, Balance training, Stair training, Joint mobilization, and DME instructions  PLAN FOR NEXT SESSION:  -Continue slow progress to load bearing activity following tibial contusion 07/10/24.  -promote improved hip extension.  -Improve independence/safety with gait. -backward and lateral stepping, hands free   12:17 PM, 08/22/2024 Norman Sharps, PT, DPT Physical Therapist - [place]  [facility] [phone]      "

## 2024-08-27 ENCOUNTER — Ambulatory Visit

## 2024-08-29 ENCOUNTER — Other Ambulatory Visit (INDEPENDENT_AMBULATORY_CARE_PROVIDER_SITE_OTHER): Payer: Self-pay | Admitting: Nurse Practitioner

## 2024-08-29 ENCOUNTER — Ambulatory Visit

## 2024-08-29 DIAGNOSIS — R262 Difficulty in walking, not elsewhere classified: Secondary | ICD-10-CM | POA: Diagnosis not present

## 2024-08-29 DIAGNOSIS — M6281 Muscle weakness (generalized): Secondary | ICD-10-CM

## 2024-08-29 DIAGNOSIS — I70211 Atherosclerosis of native arteries of extremities with intermittent claudication, right leg: Secondary | ICD-10-CM

## 2024-08-29 NOTE — Therapy (Signed)
 " OUTPATIENT PHYSICAL THERAPY TREATMENT  Patient Name: Penny Chase MRN: [id number] DOB:March 15, 1971, 54 y.o., female Today's Date: 08/29/2024  PCP: [facility] REFERRING PROVIDER: Delores Orvin BRAVO, NP  END OF SESSION:   PT End of Session - 08/29/24 1533     Visit Number 25    Number of Visits 41    Date for Recertification  09/19/24    Authorization Type [employer]    Authorization Time Period Aetna [facility]    Progress Note Due on Visit 30    PT Start Time 1533    PT Stop Time 1616    PT Time Calculation (min) 43 min    Equipment Utilized During Treatment Gait belt    Activity Tolerance Patient tolerated treatment well;No increased pain    Behavior During Therapy [facility] for tasks assessed/performed           Past Medical History:  Diagnosis Date   Acute lower limb ischemia    Anemia    Breast cancer (HCC)    Cancer (HCC) 09/2018   Right breast   COVID-19 11/2018   Family history of breast cancer    GERD (gastroesophageal reflux disease)    Hypertension    Personal history of radiation therapy 2020   Rt breast   Pulmonary embolism [facility])    Past Surgical History:  Procedure Laterality Date   AMPUTATION Left 11/26/2023   Procedure: AMPUTATION BELOW KNEE;  Surgeon: Jama Cordella MATSU, MD;  Location: [facility] ORS;  Service: Vascular;  Laterality: Left;   BREAST BIOPSY Right 09/12/2018   us  core/venus clip-INVASIVE MAMMARYLobular CARCINOMA, WITH LOBULAR FEATURES   BREAST LUMPECTOMY Right 10/04/2018   BREAST LUMPECTOMY WITH NEEDLE LOCALIZATION AND AXILLARY SENTINEL LYMPH NODE BX Right 10/04/2018   Procedure: BREAST LUMPECTOMY WITH NEEDLE LOCALIZATION AND AXILLARY SENTINEL LYMPH NODE BX;  Surgeon: Rodolph Romano, MD;  Location: [facility] ORS;  Service: General;  Laterality: Right;   COLONOSCOPY N/A 11/28/2023   Procedure: COLONOSCOPY;  Surgeon: Jinny Carmine, MD;  Location: [facility] ENDOSCOPY;  Service: Endoscopy;  Laterality: N/A;   ESOPHAGOGASTRODUODENOSCOPY N/A  11/28/2023   Procedure: EGD (ESOPHAGOGASTRODUODENOSCOPY);  Surgeon: Jinny Carmine, MD;  Location: [facility] ENDOSCOPY;  Service: Endoscopy;  Laterality: N/A;   ESOPHAGOGASTRODUODENOSCOPY N/A 04/02/2024   Procedure: EGD (ESOPHAGOGASTRODUODENOSCOPY);  Surgeon: Therisa Bi, MD;  Location: [facility] ENDOSCOPY;  Service: Gastroenterology;  Laterality: N/A;   GIVENS CAPSULE STUDY N/A 11/28/2023   Procedure: IMAGING PROCEDURE, GI TRACT, INTRALUMINAL, VIA CAPSULE;  Surgeon: Jinny Carmine, MD;  Location: [facility] ENDOSCOPY;  Service: Endoscopy;  Laterality: N/A;   IVC FILTER INSERTION N/A 11/28/2023   Procedure: IVC FILTER INSERTION;  Surgeon: Jama Cordella MATSU, MD;  Location: [facility] INVASIVE CV LAB;  Service: Cardiovascular;  Laterality: N/A;   LOWER EXTREMITY ANGIOGRAPHY Left 11/24/2023   Procedure: Lower Extremity Angiography;  Surgeon: Jama Cordella MATSU, MD;  Location: [facility] INVASIVE CV LAB;  Service: Cardiovascular;  Laterality: Left;   TONSILLECTOMY     Patient Active Problem List   Diagnosis Date Noted   Lumbar spondylosis 03/08/2024   Complex regional pain syndrome type 2 of left lower extremity 03/08/2024   Atherosclerosis of native arteries of extremity with intermittent claudication 03/02/2024   Phantom limb syndrome with pain (HCC) 03/01/2024   Diverticulitis of colon 03/01/2024   Pain 02/13/2024   Hx of left BKA (HCC) 01/25/2024   Symptomatic anemia 12/01/2023   Multiple gastric ulcers 11/28/2023   Ischemic colitis 11/28/2023   Bloody stools 11/26/2023   Rectal bleeding 11/26/2023   Acute blood  loss anemia 11/26/2023   Blister of left foot 11/24/2023   Limb ischemia 11/23/2023   Gangrene of left foot (HCC) 11/23/2023   Tobacco abuse 11/23/2023   PE (pulmonary thromboembolism) (HCC) 11/23/2023   GI bleeding 11/23/2023   Substance abuse (HCC) 11/23/2023   Leukocytosis 11/23/2023   Bipolar affective disorder, current episode manic (HCC) 01/06/2021   Brief psychotic disorder (HCC) 01/03/2021   Family  history of breast cancer    Carcinoma of upper-outer quadrant of right breast in female, estrogen receptor positive (HCC) 09/20/2018   Gastroesophageal reflux disease without esophagitis 01/04/2017   HTN (hypertension), benign 01/20/2014    ONSET DATE: 04/11/24  REFERRING DIAG: S10.487 (ICD-10-CM) - Hx of left BKA (HCC)   THERAPY DIAG:  Difficulty in walking, not elsewhere classified  Muscle weakness (generalized)  Rationale for Evaluation and Treatment: Rehabilitation  SUBJECTIVE:                                                                                                                                                                                             SUBJECTIVE STATEMENT: Pt reports doing good today. She ambulated in clinic today without AD. She reports she has been walking for about 2 weeks now without her FWW. She reports she has been doing great with it. She reports she has been walking in home and out in yard over uneven surfaces without her walker as well.   PERTINENT HISTORY: Patient is a 54 year old female with acute limb ischemia s/p left BKA. PE in association with lower GI bleed s/p IVC filter.  PAIN:  Are you having pain? None  PRECAUTIONS: None WEIGHT BEARING RESTRICTIONS: WBAT FALLS: Has patient fallen in last 6 months? Yes. Number of falls 2  LIVING ENVIRONMENT: Lives with: lives with their spouse Lives in: House/apartment Stairs: Ramped entrance Has following equipment at home: Environmental Consultant - 2 wheeled, Wheelchair (manual), and Architectural Technologist  PLOF: Independent  PATIENT GOALS: Get out of the wheelchair and be able to get outside and do yard work that needs to be done.  OBJECTIVE:  Note: Objective measures were completed at Evaluation unless otherwise noted.  DIAGNOSTIC FINDINGS:  EXAM: MRI LUMBAR SPINE WITHOUT CONTRAST  IMPRESSION: Generally mild for age lumbar spine degeneration. No spinal stenosis or convincing neural impingement.   However,  small left-side annular fissures of the discs at L4-L5 and L5-S1, which might be a source for Left L5 and/or S1 radiculitis (respectively).   LOWER EXTREMITY MMT:    MMT Right Eval Left Eval Right  08/06/24 Left 08/06/24  Hip flexion 4 5 5/5 5/5  Knee flexion 5 3+ 5/5  4+/5  Knee extension 5 3+ 5/5 4+/5  (Blank rows = not tested)  ROM Assessment Hip 07/16/24    Right  Left   ABDCT Supine  35  External rotation (90/90)  65  Internal Rotation (90/90)  35  Prone Extension ROM 25* -5 (pectineus pain)   External Rotation Prone  50  Internal Rotation Prone  30  Prone knee fleixon  60                                                                                                                                TREATMENT DATE: 08/29/2024  -AMB overground 425ft without AD.  -STS from chair 2x10 c 5kg ball  -Mock 6'' curb step up/down in // bars x6 laps with min. A.  -GT over uneven surface (4 airex pads) in // bars x 6 laps with min. A.   -normal stance on foam pad: eyes closed 30''x3 -wide tandem 3x30sec hold bilat (2x4 width)  -SLS: Attempted, but unable due to pain in residual limb.    PATIENT EDUCATION: Education details: Pt educated on role of PT and services provided during current POC, along with prognosis and information about the clinic. Person educated: Patient and Spouse Education method: Explanation Education comprehension: verbalized understanding  HOME EXERCISE PROGRAM: Access Code: [email or web address] URL: [email or web address] Date: 08/06/2024 Prepared by: Peggye Linear  Exercises - Forward and backward step Right leg   - 1 x daily - 7 x weekly - 3 sets - 15 reps - Standing Hip Abduction with Counter Support  - 1 x daily - 7 x weekly - 2 sets - 10 reps - Mini Squat with Counter Support  - 1 x daily - 7 x weekly - 2 sets - 10 reps - Seated Hamstring Stretch  - 2 x daily - 1 sets - 2 reps - 45sec hold - Seated Knee Extension Stretch with Chair  - 2 x daily  - 1 sets - 2 reps - 45sec hold - Supine Knee Extension Stretch on Towel Roll  - 2 x daily - 1 sets - 2 reps - 90 hold - Prone Lying with Towel Roll (Hip Flexor Stretch) (BKA)  - 2 x daily - 1 sets - 1 reps - 2m hold - Stretching Adductors  - 2 x daily - 3 reps - 90 hold - Semi-Tandem Balance at Counter Top Eyes Open  - 1 x daily - 3 x weekly - 5 sets - 10 reps - 30 hold  GOALS: Goals reviewed with patient? Yes  SHORT TERM GOALS: Target date: 05/30/2024  Pt will be independent with HEP in order to demonstrate increased ability to perform tasks related to occupation/hobbies. Baseline: Goal status: INITIAL  LONG TERM GOALS: Target date: 07/25/2024  1.  Patient (> 22 years old) will complete five times sit to stand test in < 15 seconds indicating an increased LE strength and improved balance. Baseline: 11.36 sec 06/11/24: 12.73 sec 07/23/24:  13.15 sec Goal status: PROGRESSING   2.  Patient will reduce timed up and go to <11 seconds to reduce fall risk and demonstrate improved transfer/gait ability. Baseline: 19.53 sec  10/29: 25.52 sec 07/23/24: 26.24 sec Goal status: PROGRESSING  3.  Patient will increase 10 meter walk test to >1.1m/s as to improve gait speed for better community ambulation and to reduce fall risk. Baseline: TBD 07/23/24: 20.34 sec Goal status: INITIAL  4.  Patient will increase six minute walk test distance to >1000 for progression to community ambulator and improve gait ability. Baseline: 112 feet in 2 min and 8 sec  07/23/24: 161 in 1 min 42 Goal status: INITIAL  5.  Pt will improve their Locomotor Capabilities Index (LCI-5) score by at least 7 points (MCID), demonstrating a clinically meaningful increase in ability to perform basic locomotor tasks with assistance and/or use of aids. Baseline: 06/11/24: 29 08/12/24: 37/56 Goal Status: INITIAL   ASSESSMENT:  CLINICAL IMPRESSION:  Today patient worked on all aspects of gait without AD including walking on  even surface, walking on uneven surfaces, and walking up onto and off of 6'' curb. Patient also worked to improve balance with decreased BOS, EC, and standing on Airex. She attempted to perform SLS, but reported pain in distal limb of L leg when attempting and therefore the exercises was terminated. Patient noted to have decreased stance phase on L with gait possibly due to decreased balance on L and increased pain in L knee with weight bearing. Patient will benefit from skilled physical therapy intervention to reduce deficits and impairments identified in evaluation, in order to reduce pain, improve quality of life, and maximize activity tolerance for ADL, IADL, and leisure/fitness. Physical therapy will help pt achieve long and short term goals of care.    OBJECTIVE IMPAIRMENTS: Abnormal gait, decreased activity tolerance, decreased balance, decreased endurance, decreased knowledge of use of DME, decreased mobility, difficulty walking, decreased ROM, decreased strength, prosthetic dependency , and pain.   ACTIVITY LIMITATIONS: carrying, lifting, bending, standing, squatting, stairs, transfers, bathing, toileting, dressing, hygiene/grooming, and caring for others  PARTICIPATION LIMITATIONS: meal prep, cleaning, laundry, driving, shopping, community activity, occupation, and yard work  PERSONAL FACTORS: Age, Behavior pattern, Fitness, Past/current experiences, Profession, Time since onset of injury/illness/exacerbation, and 3+ comorbidities: anemia, breast cancer, HTN, PE's are also affecting patient's functional outcome.   REHAB POTENTIAL: Good  CLINICAL DECISION MAKING: Evolving/moderate complexity  EVALUATION COMPLEXITY: Moderate  PLAN:  PT FREQUENCY: 2x/week PT DURATION: 12 weeks PLANNED INTERVENTIONS: 97750- Physical Performance Testing, 97110-Therapeutic exercises, 97530- Therapeutic activity, [id number]- Neuromuscular re-education, 97535- Self Care, 02859- Manual therapy, [phone]- Gait training,  [phone]- Orthotic/Prosthetic subsequent, Patient/Family education, Balance training, Stair training, Joint mobilization, and DME instructions  PLAN FOR NEXT SESSION:  -promote improved hip extension.  -Improve independence/safety with gait. -Improve patient's ability to ambulate on uneven surfaces, turn, and onto/off of curbs with decreased risk for falls.  4:35 PM, 08/29/2024 Norman Sharps, PT, DPT Physical Therapist - [facility]  [phone]      "

## 2024-08-30 ENCOUNTER — Other Ambulatory Visit: Payer: Self-pay

## 2024-08-30 ENCOUNTER — Encounter (INDEPENDENT_AMBULATORY_CARE_PROVIDER_SITE_OTHER): Payer: Self-pay | Admitting: Nurse Practitioner

## 2024-08-30 ENCOUNTER — Ambulatory Visit (INDEPENDENT_AMBULATORY_CARE_PROVIDER_SITE_OTHER)

## 2024-08-30 ENCOUNTER — Ambulatory Visit (INDEPENDENT_AMBULATORY_CARE_PROVIDER_SITE_OTHER): Admitting: Nurse Practitioner

## 2024-08-30 ENCOUNTER — Emergency Department

## 2024-08-30 ENCOUNTER — Emergency Department: Admission: EM | Admit: 2024-08-30 | Discharge: 2024-08-30 | Discharge status: Against Medical Advice

## 2024-08-30 VITALS — BP 104/64 | HR 85 | Resp 17 | Ht 64.0 in | Wt 127.0 lb

## 2024-08-30 DIAGNOSIS — I70211 Atherosclerosis of native arteries of extremities with intermittent claudication, right leg: Secondary | ICD-10-CM | POA: Diagnosis not present

## 2024-08-30 DIAGNOSIS — Z5321 Procedure and treatment not carried out due to patient leaving prior to being seen by health care provider: Secondary | ICD-10-CM | POA: Insufficient documentation

## 2024-08-30 DIAGNOSIS — I2699 Other pulmonary embolism without acute cor pulmonale: Secondary | ICD-10-CM

## 2024-08-30 DIAGNOSIS — R0609 Other forms of dyspnea: Secondary | ICD-10-CM | POA: Diagnosis not present

## 2024-08-30 DIAGNOSIS — R079 Chest pain, unspecified: Secondary | ICD-10-CM | POA: Insufficient documentation

## 2024-08-30 LAB — CBC
HCT: 39 % (ref 36.0–46.0)
Hemoglobin: 12.6 g/dL (ref 12.0–15.0)
MCH: 30.7 pg (ref 26.0–34.0)
MCHC: 32.3 g/dL (ref 30.0–36.0)
MCV: 95.1 fL (ref 80.0–100.0)
Platelets: 454 K/uL — ABNORMAL HIGH (ref 150–400)
RBC: 4.1 MIL/uL (ref 3.87–5.11)
RDW: 14.6 % (ref 11.5–15.5)
WBC: 10.1 K/uL (ref 4.0–10.5)
nRBC: 0 % (ref 0.0–0.2)

## 2024-08-30 LAB — BASIC METABOLIC PANEL WITH GFR
Anion gap: 10 (ref 5–15)
BUN: 11 mg/dL (ref 6–20)
CO2: 25 mmol/L (ref 22–32)
Calcium: 9.7 mg/dL (ref 8.9–10.3)
Chloride: 103 mmol/L (ref 98–111)
Creatinine, Ser: 1.05 mg/dL — ABNORMAL HIGH (ref 0.44–1.00)
GFR, Estimated: >60 mL/min
Glucose, Bld: 103 mg/dL — ABNORMAL HIGH (ref 70–99)
Potassium: 4.2 mmol/L (ref 3.5–5.1)
Sodium: 138 mmol/L (ref 135–145)

## 2024-08-30 LAB — TROPONIN T, HIGH SENSITIVITY: Troponin T High Sensitivity: <15 ng/L (ref 0–19)

## 2024-08-30 NOTE — ED Triage Notes (Addendum)
 Pt to ED via POV from [facility]. Pt reports uncontrollable shakes and CP that started today. Pt also reports SOB w/ exertion. Pt brought over on 2L [place]. Sats 99% on 2L [place]. Pt put back onto RA and sats 98%

## 2024-08-30 NOTE — Progress Notes (Signed)
 "  Subjective:    Patient ID: Penny Chase, female    DOB: 17-Jan-1971, 54 y.o.   MRN: [id number] Chief Complaint  Patient presents with   Follow-up    3 months + ABI   Tremors per pt since am and chest pain since this am SOB     HPI  Discussed the use of AI scribe software for clinical note transcription with the patient, who gave verbal consent to proceed.  History of Present Illness Penny Chase is a 54 year old female with right lower extremity atherosclerosis, left below-knee amputation, and an indwelling IVC filter who presents with acute onset shortness of breath.  She developed acute onset shortness of breath early this morning, primarily with exertion such as walking. She does not use supplemental oxygen at baseline and has not previously experienced similar symptoms. She denies fever, cough, or recent illness.  She experienced chest pain a few times this morning, described as associated with shaking. She is not currently experiencing chest pain and has not had prior similar episodes.  She developed tremors involving the upper body but not the legs early this morning. The tremors were more pronounced earlier in the day and have since improved. She denies associated weakness.  She continues to take Eliquis once daily. An IVC filter was placed after her left below-knee amputation in April 2025 and remains in place. She is not scheduled for any upcoming surgeries. She has not had any recent falls except for a prior incident resulting in a persistent hematoma on her leg, which has not fully resolved.    Results Diagnostic Ankle-brachial index (right): 1.01 Toe-brachial index (right): 0.93   Review of Systems  Respiratory:  Positive for chest tightness and shortness of breath.   Neurological:  Positive for tremors and weakness.  All other systems reviewed and are negative.      Objective:   Physical Exam Vitals reviewed.  HENT:     Head: Normocephalic.   Cardiovascular:     Rate and Rhythm: Normal rate.  Pulmonary:     Effort: Pulmonary effort is normal.  Skin:    General: Skin is warm and dry.  Neurological:     Mental Status: She is alert and oriented to person, place, and time.  Psychiatric:        Mood and Affect: Mood normal.        Behavior: Behavior normal.        Thought Content: Thought content normal.        Judgment: Judgment normal.     Physical Exam VITALS: BP- 104/64 MUSCULOSKELETAL: Generalized weakness  BP 104/64   Pulse 85   Resp 17   Ht 5' 4 (1.626 m)   Wt 127 lb (57.6 kg)   LMP 09/20/2018   BMI 21.80 kg/m   Past Medical History:  Diagnosis Date   Acute lower limb ischemia    Anemia    Breast cancer (HCC)    Cancer (HCC) 09/2018   Right breast   COVID-19 11/2018   Family history of breast cancer    GERD (gastroesophageal reflux disease)    Hypertension    Personal history of radiation therapy 2020   Rt breast   Pulmonary embolism (HCC)     Social History   Socioeconomic History   Marital status: Married    Spouse name: Not on file   Number of children: Not on file   Years of education: Not on file   Highest education level:  Not on file  Occupational History   Not on file  Tobacco Use   Smoking status: Every Day    Current packs/day: 1.00    Average packs/day: 1 pack/day for 15.0 years (15.0 ttl pk-yrs)    Types: Cigarettes   Smokeless tobacco: Never   Tobacco comments:    patient declined  Vaping Use   Vaping status: Former  Substance and Sexual Activity   Alcohol use: Not Currently    Comment: not taken for about 6 weeks   Drug use: Not Currently    Types: Marijuana    Comment: Uses street opioids   Sexual activity: Not Currently  Other Topics Concern   Not on file  Social History Narrative   Works at [employer];  Smoke 15 cig/day; No alcohol abuse; in f[employer] group; with husband.    Social Drivers of Health   Tobacco Use: High Risk (08/30/2024)    Patient History    Smoking Tobacco Use: Every Day    Smokeless Tobacco Use: Never    Passive Exposure: Not on file  Financial Resource Strain: Low Risk  (03/27/2024)   Received from [facility]   Overall Financial Resource Strain (CARDIA)    Difficulty of Paying Living Expenses: Not hard at all  Food Insecurity: No Food Insecurity (03/27/2024)   Received from [facility]   Epic    Within the past 12 months, you worried that your food would run out before you got the money to buy more.: Never true    Within the past 12 months, the food you bought just didn't last and you didn't have money to get more.: Never true  Transportation Needs: No Transportation Needs (03/27/2024)   Received from [facility] - Transportation    In the past 12 months, has lack of transportation kept you from medical appointments or from getting medications?: No    Lack of Transportation (Non-Medical): No  Physical Activity: Not on file  Stress: Not on file  Social Connections: Not on file  Intimate Partner Violence: Not At Risk (02/14/2024)   Epic    Fear of Current or Ex-Partner: No    Emotionally Abused: No    Physically Abused: No    Sexually Abused: No  Depression (PHQ2-9): Low Risk (05/15/2024)   Depression (PHQ2-9)    PHQ-2 Score: 0  Alcohol Screen: Not on file  Housing: Low Risk  (03/27/2024)   Received from [facility]   Epic    In the last 12 months, was there a time when you were not able to pay the mortgage or rent on time?: No    In the past 12 months, how many times have you moved where you were living?: 0    At any time in the past 12 months, were you homeless or living in a shelter (including now)?: No  Utilities: Not At Risk (03/27/2024)   Received from [facility]   Epic    In the past 12 months has the electric, gas, oil, or water company threatened to shut off services in your home?: No  Health  Literacy: Not on file    Past Surgical History:  Procedure Laterality Date   AMPUTATION Left 11/26/2023   Procedure: AMPUTATION BELOW KNEE;  Surgeon: Jama Cordella MATSU, MD;  Location: [facility] ORS;  Service: Vascular;  Laterality: Left;   BREAST BIOPSY Right 09/12/2018   us  core/venus clip-INVASIVE MAMMARYLobular CARCINOMA, WITH  LOBULAR FEATURES   BREAST LUMPECTOMY Right 10/04/2018   BREAST LUMPECTOMY WITH NEEDLE LOCALIZATION AND AXILLARY SENTINEL LYMPH NODE BX Right 10/04/2018   Procedure: BREAST LUMPECTOMY WITH NEEDLE LOCALIZATION AND AXILLARY SENTINEL LYMPH NODE BX;  Surgeon: Rodolph Romano, MD;  Location: [facility] ORS;  Service: General;  Laterality: Right;   COLONOSCOPY N/A 11/28/2023   Procedure: COLONOSCOPY;  Surgeon: Jinny Carmine, MD;  Location: [facility] ENDOSCOPY;  Service: Endoscopy;  Laterality: N/A;   ESOPHAGOGASTRODUODENOSCOPY N/A 11/28/2023   Procedure: EGD (ESOPHAGOGASTRODUODENOSCOPY);  Surgeon: Jinny Carmine, MD;  Location: [facility] ENDOSCOPY;  Service: Endoscopy;  Laterality: N/A;   ESOPHAGOGASTRODUODENOSCOPY N/A 04/02/2024   Procedure: EGD (ESOPHAGOGASTRODUODENOSCOPY);  Surgeon: Therisa Bi, MD;  Location: [facility] ENDOSCOPY;  Service: Gastroenterology;  Laterality: N/A;   GIVENS CAPSULE STUDY N/A 11/28/2023   Procedure: IMAGING PROCEDURE, GI TRACT, INTRALUMINAL, VIA CAPSULE;  Surgeon: Jinny Carmine, MD;  Location: [facility] ENDOSCOPY;  Service: Endoscopy;  Laterality: N/A;   IVC FILTER INSERTION N/A 11/28/2023   Procedure: IVC FILTER INSERTION;  Surgeon: Jama Cordella MATSU, MD;  Location: [facility] INVASIVE CV LAB;  Service: Cardiovascular;  Laterality: N/A;   LOWER EXTREMITY ANGIOGRAPHY Left 11/24/2023   Procedure: Lower Extremity Angiography;  Surgeon: Jama Cordella MATSU, MD;  Location: [facility] INVASIVE CV LAB;  Service: Cardiovascular;  Laterality: Left;   TONSILLECTOMY      Family History  Problem Relation Age of Onset   Diabetes Mother    Atrial fibrillation Father    Breast cancer Paternal  Aunt     Allergies[1]     Latest Ref Rng & Units 05/15/2024    2:14 PM 02/14/2024    1:28 PM 01/17/2024    1:12 PM  CBC  WBC 4.0 - 10.5 K/uL 13.9  10.8  11.8   Hemoglobin 12.0 - 15.0 g/dL 87.5  85.9  87.4   Hematocrit 36.0 - 46.0 % 38.7  44.3  41.3   Platelets 150 - 400 K/uL 432  417  442       CMP     Component Value Date/Time   NA 135 05/15/2024 1414   K 4.2 05/15/2024 1414   CL 101 05/15/2024 1414   CO2 25 05/15/2024 1414   GLUCOSE 103 (H) 05/15/2024 1414   BUN 21 (H) 05/15/2024 1414   CREATININE 1.14 (H) 05/15/2024 1414   CALCIUM  9.3 05/15/2024 1414   PROT 6.6 11/24/2023 0254   ALBUMIN 2.5 (L) 11/24/2023 0254   AST 28 11/24/2023 0254   ALT 27 11/24/2023 0254   ALKPHOS 97 11/24/2023 0254   BILITOT 0.5 11/24/2023 0254   GFRNONAA 57 (L) 05/15/2024 1414     No results found.     Assessment & Plan:   1. Atherosclerosis of native artery of right lower extremity with intermittent claudication (Primary) Atherosclerosis of native artery of right lower extremity with intermittent claudication Chronic atherosclerotic disease. Normal noninvasive vascular studies and strong triphasic waveforms. New symptoms not related to peripheral vascular disease. - Repeat vascular studies in six months.  2. PE (pulmonary thromboembolism) (HCC) IVC filter in situ post-amputation. On apixaban once daily. Discussed filter removal, balancing thromboembolic risk and need for further evaluation. Long-term retention risk noted. - Reassess filter removal after current workup for acute symptoms. - Patient has concerning symptoms with a differential of possible myocardial infarction, pulmonary embolism, and/or pneumonia.  We have sent the patient to the emergency room for evaluation. - It should be noted the patient has not been taking her Eliquis as prescribed and only taking it once per day.  Medications Ordered Prior to Encounter[2]  There are no Patient Instructions on file for this  visit. Return in about 6 months (around 02/27/2025) for PAD.   Bobi Daudelin E Bilbo Carcamo, NP       [1]  Allergies Allergen Reactions   Amlodipine  Swelling  [2]  Current Outpatient Medications on File Prior to Visit  Medication Sig Dispense Refill   acetaminophen  (TYLENOL ) 500 MG tablet Take 500 mg by mouth every 6 (six) hours as needed for moderate pain (pain score 4-6).     apixaban (ELIQUIS) 5 MG TABS tablet Take 5 mg by mouth 2 (two) times daily.     atorvastatin (LIPITOR) 20 MG tablet Take 20 mg by mouth at bedtime.     celecoxib (CELEBREX) 100 MG capsule Take 100 mg by mouth 2 (two) times daily.     cyanocobalamin  1000 MCG tablet Take 1 tablet (1,000 mcg total) by mouth daily. 30 tablet 2   folic acid  (FOLVITE ) 1 MG tablet Take 1 tablet (1 mg total) by mouth daily. 30 tablet 0   gabapentin  (NEURONTIN ) 600 MG tablet Take 1 tablet (600 mg total) by mouth 3 (three) times daily. 90 tablet 0   iron  polysaccharides (NIFEREX) 150 MG capsule Take 1 capsule (150 mg total) by mouth daily. 30 capsule 0   lisinopril (ZESTRIL) 20 MG tablet Take 20 mg by mouth daily.     oxyCODONE  (OXY IR/ROXICODONE ) 5 MG immediate release tablet Take 1 tablet (5 mg total) by mouth every 6 (six) hours as needed for severe pain (pain score 7-10). 46 tablet 0   tiZANidine  (ZANAFLEX ) 4 MG tablet TAKE 1 TABLET BY MOUTH EVERY 6 HOURS AS NEEDED FOR MUSCLE SPASMS. 30 tablet 3   venlafaxine  XR (EFFEXOR -XR) 37.5 MG 24 hr capsule TAKE 1 CAPSULE BY MOUTH DAILY WITH BREAKFAST. 90 capsule 2   pantoprazole  (PROTONIX ) 40 MG tablet Take 1 tablet (40 mg total) by mouth 2 (two) times daily before a meal. (Patient taking differently: Take 40 mg by mouth daily.) 60 tablet 1   No current facility-administered medications on file prior to visit.   "

## 2024-09-03 ENCOUNTER — Ambulatory Visit: Admitting: Physical Therapy

## 2024-09-03 DIAGNOSIS — M6281 Muscle weakness (generalized): Secondary | ICD-10-CM

## 2024-09-03 DIAGNOSIS — R262 Difficulty in walking, not elsewhere classified: Secondary | ICD-10-CM

## 2024-09-03 LAB — VAS US ABI WITH/WO TBI: Right ABI: 1.01

## 2024-09-03 NOTE — Therapy (Signed)
 " OUTPATIENT PHYSICAL THERAPY TREATMENT  Patient Name: Penny Chase MRN: [id number] DOB:03/26/1971, 54 y.o., female Today's Date: 09/03/2024  PCP: [facility] REFERRING PROVIDER: Delores Orvin BRAVO, NP  END OF SESSION:   PT End of Session - 09/03/24 1620     Visit Number 26    Number of Visits 41    Date for Recertification  09/19/24    Authorization Type Aetna    Authorization Time Period Aetna [facility]    Progress Note Due on Visit 30    PT Start Time 1618    PT Stop Time 1658    PT Time Calculation (min) 40 min    Equipment Utilized During Treatment Gait belt    Activity Tolerance Patient tolerated treatment well;No increased pain    Behavior During Therapy [facility] for tasks assessed/performed            Past Medical History:  Diagnosis Date   Acute lower limb ischemia    Anemia    Breast cancer (HCC)    Cancer (HCC) 09/2018   Right breast   COVID-19 11/2018   Family history of breast cancer    GERD (gastroesophageal reflux disease)    Hypertension    Personal history of radiation therapy 2020   Rt breast   Pulmonary embolism [facility])    Past Surgical History:  Procedure Laterality Date   AMPUTATION Left 11/26/2023   Procedure: AMPUTATION BELOW KNEE;  Surgeon: Jama Cordella MATSU, MD;  Location: [facility] ORS;  Service: Vascular;  Laterality: Left;   BREAST BIOPSY Right 09/12/2018   us  core/venus clip-INVASIVE MAMMARYLobular CARCINOMA, WITH LOBULAR FEATURES   BREAST LUMPECTOMY Right 10/04/2018   BREAST LUMPECTOMY WITH NEEDLE LOCALIZATION AND AXILLARY SENTINEL LYMPH NODE BX Right 10/04/2018   Procedure: BREAST LUMPECTOMY WITH NEEDLE LOCALIZATION AND AXILLARY SENTINEL LYMPH NODE BX;  Surgeon: Rodolph Romano, MD;  Location: [facility] ORS;  Service: General;  Laterality: Right;   COLONOSCOPY N/A 11/28/2023   Procedure: COLONOSCOPY;  Surgeon: Jinny Carmine, MD;  Location: [facility] ENDOSCOPY;  Service: Endoscopy;  Laterality: N/A;   ESOPHAGOGASTRODUODENOSCOPY N/A  11/28/2023   Procedure: EGD (ESOPHAGOGASTRODUODENOSCOPY);  Surgeon: Jinny Carmine, MD;  Location: [facility] ENDOSCOPY;  Service: Endoscopy;  Laterality: N/A;   ESOPHAGOGASTRODUODENOSCOPY N/A 04/02/2024   Procedure: EGD (ESOPHAGOGASTRODUODENOSCOPY);  Surgeon: Therisa Bi, MD;  Location: [facility] ENDOSCOPY;  Service: Gastroenterology;  Laterality: N/A;   GIVENS CAPSULE STUDY N/A 11/28/2023   Procedure: IMAGING PROCEDURE, GI TRACT, INTRALUMINAL, VIA CAPSULE;  Surgeon: Jinny Carmine, MD;  Location: [facility] ENDOSCOPY;  Service: Endoscopy;  Laterality: N/A;   IVC FILTER INSERTION N/A 11/28/2023   Procedure: IVC FILTER INSERTION;  Surgeon: Jama Cordella MATSU, MD;  Location: [facility] INVASIVE CV LAB;  Service: Cardiovascular;  Laterality: N/A;   LOWER EXTREMITY ANGIOGRAPHY Left 11/24/2023   Procedure: Lower Extremity Angiography;  Surgeon: Jama Cordella MATSU, MD;  Location: [facility] INVASIVE CV LAB;  Service: Cardiovascular;  Laterality: Left;   TONSILLECTOMY     Patient Active Problem List   Diagnosis Date Noted   Lumbar spondylosis 03/08/2024   Complex regional pain syndrome type 2 of left lower extremity 03/08/2024   Atherosclerosis of native arteries of extremity with intermittent claudication 03/02/2024   Phantom limb syndrome with pain (HCC) 03/01/2024   Diverticulitis of colon 03/01/2024   Pain 02/13/2024   Hx of left BKA (HCC) 01/25/2024   Symptomatic anemia 12/01/2023   Multiple gastric ulcers 11/28/2023   Ischemic colitis 11/28/2023   Bloody stools 11/26/2023   Rectal bleeding 11/26/2023   Acute  blood loss anemia 11/26/2023   Blister of left foot 11/24/2023   Limb ischemia 11/23/2023   Gangrene of left foot (HCC) 11/23/2023   Tobacco abuse 11/23/2023   PE (pulmonary thromboembolism) (HCC) 11/23/2023   GI bleeding 11/23/2023   Substance abuse (HCC) 11/23/2023   Leukocytosis 11/23/2023   Bipolar affective disorder, current episode manic (HCC) 01/06/2021   Brief psychotic disorder (HCC) 01/03/2021   Family  history of breast cancer    Carcinoma of upper-outer quadrant of right breast in female, estrogen receptor positive (HCC) 09/20/2018   Gastroesophageal reflux disease without esophagitis 01/04/2017   HTN (hypertension), benign 01/20/2014    ONSET DATE: 04/11/24  REFERRING DIAG: S10.487 (ICD-10-CM) - Hx of left BKA (HCC)   THERAPY DIAG:  Difficulty in walking, not elsewhere classified  Muscle weakness (generalized)  Rationale for Evaluation and Treatment: Rehabilitation  SUBJECTIVE:                                                                                                                                                                                             SUBJECTIVE STATEMENT: Pt reports doing good today. She ambulated in clinic today without AD. She reports she has been walking for about 2 weeks now without her FWW. She reports she has been doing great with it. She reports she has been walking in home and out in yard over uneven surfaces without her walker as well.   PERTINENT HISTORY: Patient is a 54 year old female with acute limb ischemia s/p left BKA. PE in association with lower GI bleed s/p IVC filter.  PAIN:  Are you having pain? None  PRECAUTIONS: None WEIGHT BEARING RESTRICTIONS: WBAT FALLS: Has patient fallen in last 6 months? Yes. Number of falls 2  LIVING ENVIRONMENT: Lives with: lives with their spouse Lives in: House/apartment Stairs: Ramped entrance Has following equipment at home: Environmental Consultant - 2 wheeled, Wheelchair (manual), and Architectural Technologist  PLOF: Independent  PATIENT GOALS: Get out of the wheelchair and be able to get outside and do yard work that needs to be done.  OBJECTIVE:  Note: Objective measures were completed at Evaluation unless otherwise noted.  DIAGNOSTIC FINDINGS:  EXAM: MRI LUMBAR SPINE WITHOUT CONTRAST  IMPRESSION: Generally mild for age lumbar spine degeneration. No spinal stenosis or convincing neural impingement.   However,  small left-side annular fissures of the discs at L4-L5 and L5-S1, which might be a source for Left L5 and/or S1 radiculitis (respectively).   LOWER EXTREMITY MMT:    MMT Right Eval Left Eval Right  08/06/24 Left 08/06/24  Hip flexion 4 5 5/5 5/5  Knee flexion 5 3+  5/5 4+/5  Knee extension 5 3+ 5/5 4+/5  (Blank rows = not tested)  ROM Assessment Hip 07/16/24    Right  Left   ABDCT Supine  35  External rotation (90/90)  65  Internal Rotation (90/90)  35  Prone Extension ROM 25* -5 (pectineus pain)   External Rotation Prone  50  Internal Rotation Prone  30  Prone knee fleixon  60                                                                                                                                TREATMENT DATE: 09/03/24 TA- To improve functional movements patterns for everyday tasks    - ambulate through functional obstacle course 3 x 2 laps ( 75 ft, cones, step up and down, and step over 1/2 foam rollers in loop with turns) - no UE support  - STS from chair 2x10 c 5kg ball  Mock carry groceries up stairs - 5KG ball in one hand and rail in other 2 x 2 sets of 4 stairs ( ascending and descending)   Sidestepping at support bar no UE support 2 x 5 laps   1 LE on airex other on step ant (LLE on airex) 3 x 30 sec   Unless otherwise stated, CGA was provided and gait belt donned in order to ensure pt safety  Pt required occasional rest breaks due fatigue, PT was attentive to when pt appeared to be tired or winded in order to prevent excessive fatigue.    PATIENT EDUCATION: Education details: Pt educated on role of PT and services provided during current POC, along with prognosis and information about the clinic. Person educated: Patient and Spouse Education method: Explanation Education comprehension: verbalized understanding  HOME EXERCISE PROGRAM: Access Code: [email or web address] URL: [email or web address] Date: 08/06/2024 Prepared by: Peggye Linear  Exercises - Forward and backward step Right leg   - 1 x daily - 7 x weekly - 3 sets - 15 reps - Standing Hip Abduction with Counter Support  - 1 x daily - 7 x weekly - 2 sets - 10 reps - Mini Squat with Counter Support  - 1 x daily - 7 x weekly - 2 sets - 10 reps - Seated Hamstring Stretch  - 2 x daily - 1 sets - 2 reps - 45sec hold - Seated Knee Extension Stretch with Chair  - 2 x daily - 1 sets - 2 reps - 45sec hold - Supine Knee Extension Stretch on Towel Roll  - 2 x daily - 1 sets - 2 reps - 90 hold - Prone Lying with Towel Roll (Hip Flexor Stretch) (BKA)  - 2 x daily - 1 sets - 1 reps - 75m hold - Stretching Adductors  - 2 x daily - 3 reps - 90 hold - Semi-Tandem Balance at Counter Top Eyes Open  - 1 x daily - 3 x weekly - 5 sets - 10 reps -  30 hold  GOALS: Goals reviewed with patient? Yes  SHORT TERM GOALS: Target date: 05/30/2024  Pt will be independent with HEP in order to demonstrate increased ability to perform tasks related to occupation/hobbies. Baseline: Goal status: INITIAL  LONG TERM GOALS: Target date: 07/25/2024  1.  Patient (> 36 years old) will complete five times sit to stand test in < 15 seconds indicating an increased LE strength and improved balance. Baseline: 11.36 sec 06/11/24: 12.73 sec 07/23/24: 13.15 sec Goal status: PROGRESSING   2.  Patient will reduce timed up and go to <11 seconds to reduce fall risk and demonstrate improved transfer/gait ability. Baseline: 19.53 sec  10/29: 25.52 sec 07/23/24: 26.24 sec Goal status: PROGRESSING  3.  Patient will increase 10 meter walk test to >1.48m/s as to improve gait speed for better community ambulation and to reduce fall risk. Baseline: TBD 07/23/24: 20.34 sec Goal status: INITIAL  4.  Patient will increase six minute walk test distance to >1000 for progression to community ambulator and improve gait ability. Baseline: 112 feet in 2 min and 8 sec  07/23/24: 161 in 1 min 42 Goal status: INITIAL  5.   Pt will improve their Locomotor Capabilities Index (LCI-5) score by at least 7 points (MCID), demonstrating a clinically meaningful increase in ability to perform basic locomotor tasks with assistance and/or use of aids. Baseline: 06/11/24: 29 08/12/24: 37/56 Goal Status: INITIAL   ASSESSMENT:  CLINICAL IMPRESSION:  The patient is a 53 four year old female being seen in the outpatient setting following a left lower extremity total knee arthroplasty with prosthesis. During todays session, she demonstrated continued progress in functional mobility, lower extremity strength, dynamic balance, and tolerance for closed chain activities. She successfully completed an obstacle course involving seventy five feet of ambulation with cones, step negotiation, and foam roller step overs without upper extremity support, indicating improved confidence and motor control on the left lower extremity. Sit to stand training with a five kilogram ball was completed with good form and improving eccentric control, suggesting gains in quadriceps strength and functional endurance. Stair training while carrying simulated groceries was performed safely with contact guard assist and use of the rail, demonstrating improving ability to manage household level tasks that challenge load bearing and coordination on the surgical limb. Lateral stepping at the support bar and static balance tasks on an Airex pad with the left lower extremity positioned on an unstable surface further challenged proprioception and stance stability. The patient was able to complete these activities without upper extremity support, showing meaningful progress in single limb balance and neuromuscular control. Overall, the patient tolerated all activities well with contact guard assist for safety. She continues to benefit from skilled physical therapy to address residual strength deficits, balance limitations, and functional mobility needs in order to maximize  independence and safety with daily and community activities.   OBJECTIVE IMPAIRMENTS: Abnormal gait, decreased activity tolerance, decreased balance, decreased endurance, decreased knowledge of use of DME, decreased mobility, difficulty walking, decreased ROM, decreased strength, prosthetic dependency , and pain.   ACTIVITY LIMITATIONS: carrying, lifting, bending, standing, squatting, stairs, transfers, bathing, toileting, dressing, hygiene/grooming, and caring for others  PARTICIPATION LIMITATIONS: meal prep, cleaning, laundry, driving, shopping, community activity, occupation, and yard work  PERSONAL FACTORS: Age, Behavior pattern, Fitness, Past/current experiences, Profession, Time since onset of injury/illness/exacerbation, and 3+ comorbidities: anemia, breast cancer, HTN, PE's are also affecting patient's functional outcome.   REHAB POTENTIAL: Good  CLINICAL DECISION MAKING: Evolving/moderate complexity  EVALUATION COMPLEXITY: Moderate  PLAN:  PT FREQUENCY: 2x/week PT DURATION: 12 weeks PLANNED INTERVENTIONS: 97750- Physical Performance Testing, 97110-Therapeutic exercises, 97530- Therapeutic activity, [id number]- Neuromuscular re-education, 97535- Self Care, [id number]- Manual therapy, [phone]- Gait training, [phone]- Orthotic/Prosthetic subsequent, Patient/Family education, Balance training, Stair training, Joint mobilization, and DME instructions  PLAN FOR NEXT SESSION:  -promote improved hip extension.  -Improve independence/safety with gait. -Improve patient's ability to ambulate on uneven surfaces, turn, and onto/off of curbs with decreased risk for falls.  5:01 PM, 09/03/24 Note: Portions of this document were prepared using Dragon voice recognition software and although reviewed may contain unintentional dictation errors in syntax, grammar, or spelling.  Lonni KATHEE Gainer PT ,DPT Physical Therapist- [place]  [facility]    [phone]      "

## 2024-09-05 ENCOUNTER — Ambulatory Visit

## 2024-09-05 DIAGNOSIS — R262 Difficulty in walking, not elsewhere classified: Secondary | ICD-10-CM | POA: Diagnosis not present

## 2024-09-05 DIAGNOSIS — M6281 Muscle weakness (generalized): Secondary | ICD-10-CM

## 2024-09-05 NOTE — Therapy (Signed)
 " OUTPATIENT PHYSICAL THERAPY TREATMENT  Patient Name: Penny Chase MRN: [id number] DOB:[birth year], 54 y.o., female Today's Date: 09/05/2024  PCP: [facility] REFERRING PROVIDER: Delores Orvin BRAVO, NP  END OF SESSION:   PT End of Session - 09/05/24 1451     Visit Number 27    Number of Visits 41    Date for Recertification  09/19/24    Authorization Type Aetna    Authorization Time Period Aetna [facility]    Progress Note Due on Visit 30    PT Start Time 1449    Equipment Utilized During Treatment Gait belt    Activity Tolerance Patient tolerated treatment well;No increased pain    Behavior During Therapy [facility] for tasks assessed/performed            Past Medical History:  Diagnosis Date   Acute lower limb ischemia    Anemia    Breast cancer (HCC)    Cancer (HCC) 09/2018   Right breast   COVID-19 11/2018   Family history of breast cancer    GERD (gastroesophageal reflux disease)    Hypertension    Personal history of radiation therapy 2020   Rt breast   Pulmonary embolism [facility])    Past Surgical History:  Procedure Laterality Date   AMPUTATION Left 11/26/2023   Procedure: AMPUTATION BELOW KNEE;  Surgeon: Jama Cordella MATSU, MD;  Location: [facility] ORS;  Service: Vascular;  Laterality: Left;   BREAST BIOPSY Right 09/12/2018   us  core/venus clip-INVASIVE MAMMARYLobular CARCINOMA, WITH LOBULAR FEATURES   BREAST LUMPECTOMY Right 10/04/2018   BREAST LUMPECTOMY WITH NEEDLE LOCALIZATION AND AXILLARY SENTINEL LYMPH NODE BX Right 10/04/2018   Procedure: BREAST LUMPECTOMY WITH NEEDLE LOCALIZATION AND AXILLARY SENTINEL LYMPH NODE BX;  Surgeon: Rodolph Romano, MD;  Location: [facility] ORS;  Service: General;  Laterality: Right;   COLONOSCOPY N/A 11/28/2023   Procedure: COLONOSCOPY;  Surgeon: Jinny Carmine, MD;  Location: [facility] ENDOSCOPY;  Service: Endoscopy;  Laterality: N/A;   ESOPHAGOGASTRODUODENOSCOPY N/A 11/28/2023   Procedure: EGD (ESOPHAGOGASTRODUODENOSCOPY);   Surgeon: Jinny Carmine, MD;  Location: [facility] ENDOSCOPY;  Service: Endoscopy;  Laterality: N/A;   ESOPHAGOGASTRODUODENOSCOPY N/A 04/02/2024   Procedure: EGD (ESOPHAGOGASTRODUODENOSCOPY);  Surgeon: Therisa Bi, MD;  Location: [facility] ENDOSCOPY;  Service: Gastroenterology;  Laterality: N/A;   GIVENS CAPSULE STUDY N/A 11/28/2023   Procedure: IMAGING PROCEDURE, GI TRACT, INTRALUMINAL, VIA CAPSULE;  Surgeon: Jinny Carmine, MD;  Location: [facility] ENDOSCOPY;  Service: Endoscopy;  Laterality: N/A;   IVC FILTER INSERTION N/A 11/28/2023   Procedure: IVC FILTER INSERTION;  Surgeon: Jama Cordella MATSU, MD;  Location: [facility] INVASIVE CV LAB;  Service: Cardiovascular;  Laterality: N/A;   LOWER EXTREMITY ANGIOGRAPHY Left 11/24/2023   Procedure: Lower Extremity Angiography;  Surgeon: Jama Cordella MATSU, MD;  Location: [facility] INVASIVE CV LAB;  Service: Cardiovascular;  Laterality: Left;   TONSILLECTOMY     Patient Active Problem List   Diagnosis Date Noted   Lumbar spondylosis 03/08/2024   Complex regional pain syndrome type 2 of left lower extremity 03/08/2024   Atherosclerosis of native arteries of extremity with intermittent claudication 03/02/2024   Phantom limb syndrome with pain (HCC) 03/01/2024   Diverticulitis of colon 03/01/2024   Pain 02/13/2024   Hx of left BKA (HCC) 01/25/2024   Symptomatic anemia 12/01/2023   Multiple gastric ulcers 11/28/2023   Ischemic colitis 11/28/2023   Bloody stools 11/26/2023   Rectal bleeding 11/26/2023   Acute blood loss anemia 11/26/2023   Blister of left foot 11/24/2023   Limb ischemia 11/23/2023  Gangrene of left foot (HCC) 11/23/2023   Tobacco abuse 11/23/2023   PE (pulmonary thromboembolism) (HCC) 11/23/2023   GI bleeding 11/23/2023   Substance abuse (HCC) 11/23/2023   Leukocytosis 11/23/2023   Bipolar affective disorder, current episode manic (HCC) 01/06/2021   Brief psychotic disorder (HCC) 01/03/2021   Family history of breast cancer    Carcinoma of upper-outer  quadrant of right breast in female, estrogen receptor positive (HCC) 09/20/2018   Gastroesophageal reflux disease without esophagitis 01/04/2017   HTN (hypertension), benign 01/20/2014    ONSET DATE: 04/11/24  REFERRING DIAG: S10.487 (ICD-10-CM) - Hx of left BKA (HCC)   THERAPY DIAG:  Difficulty in walking, not elsewhere classified  Muscle weakness (generalized)  Rationale for Evaluation and Treatment: Rehabilitation  SUBJECTIVE:                                                                                                                                                                                             SUBJECTIVE STATEMENT: Pt reports doing good today. She ambulated in clinic today without AD. She reports having some mild pain on bone on the stump that increases with weight shift to the L. Patient returns to prosthetist 09/18/2024.   PERTINENT HISTORY: Patient is a 54 year old female with acute limb ischemia s/p left BKA. PE in association with lower GI bleed s/p IVC filter.  PAIN:  Are you having pain? None  PRECAUTIONS: None WEIGHT BEARING RESTRICTIONS: WBAT FALLS: Has patient fallen in last 6 months? Yes. Number of falls 2  LIVING ENVIRONMENT: Lives with: lives with their spouse Lives in: House/apartment Stairs: Ramped entrance Has following equipment at home: Environmental Consultant - 2 wheeled, Wheelchair (manual), and Architectural Technologist  PLOF: Independent  PATIENT GOALS: Get out of the wheelchair and be able to get outside and do yard work that needs to be done.  OBJECTIVE:  Note: Objective measures were completed at Evaluation unless otherwise noted.  DIAGNOSTIC FINDINGS:  EXAM: MRI LUMBAR SPINE WITHOUT CONTRAST  IMPRESSION: Generally mild for age lumbar spine degeneration. No spinal stenosis or convincing neural impingement.   However, small left-side annular fissures of the discs at L4-L5 and L5-S1, which might be a source for Left L5 and/or S1  radiculitis (respectively).   LOWER EXTREMITY MMT:    MMT Right Eval Left Eval Right  08/06/24 Left 08/06/24  Hip flexion 4 5 5/5 5/5  Knee flexion 5 3+ 5/5 4+/5  Knee extension 5 3+ 5/5 4+/5  (Blank rows = not tested)  ROM Assessment Hip 07/16/24    Right  Left   ABDCT Supine  35  External rotation (90/90)  65  Internal Rotation (90/90)  35  Prone Extension ROM 25* -5 (pectineus pain)   External Rotation Prone  50  Internal Rotation Prone  30  Prone knee fleixon  60                                                                                                                                TREATMENT DATE: 09/05/24 TA- To improve functional movements patterns for everyday tasks   - STS from chair 2x10 c 5kg ball on AIREX  -Sidestepping up onto airex and over to other side x15 with CGA/min. A.   -Mock carry groceries up stairs - 5KG ball in one hand and rail in other 3 laps of 4 stairs ( ascending and descending)   -GT with zig zagging through machines in gym x 3 med ball (3,4, and 5 kg) pick up from floor with squat then zig zag back through machines and return to chair.   -GT x 70'x2 with EC CGA and 60' backward with EO CGA.      PATIENT EDUCATION: Education details: Pt educated on role of PT and services provided during current POC, along with prognosis and information about the clinic. Person educated: Patient and Spouse Education method: Explanation Education comprehension: verbalized understanding  HOME EXERCISE PROGRAM: Access Code: [email or web address] URL: [email or web address] Date: 08/06/2024 Prepared by: Peggye Linear  Exercises - Forward and backward step Right leg   - 1 x daily - 7 x weekly - 3 sets - 15 reps - Standing Hip Abduction with Counter Support  - 1 x daily - 7 x weekly - 2 sets - 10 reps - Mini Squat with Counter Support  - 1 x daily - 7 x weekly - 2 sets - 10 reps - Seated Hamstring Stretch  - 2 x daily - 1 sets - 2 reps - 45sec  hold - Seated Knee Extension Stretch with Chair  - 2 x daily - 1 sets - 2 reps - 45sec hold - Supine Knee Extension Stretch on Towel Roll  - 2 x daily - 1 sets - 2 reps - 90 hold - Prone Lying with Towel Roll (Hip Flexor Stretch) (BKA)  - 2 x daily - 1 sets - 1 reps - 26m hold - Stretching Adductors  - 2 x daily - 3 reps - 90 hold - Semi-Tandem Balance at Counter Top Eyes Open  - 1 x daily - 3 x weekly - 5 sets - 10 reps - 30 hold  GOALS: Goals reviewed with patient? Yes  SHORT TERM GOALS: Target date: 05/30/2024  Pt will be independent with HEP in order to demonstrate increased ability to perform tasks related to occupation/hobbies. Baseline: Goal status: INITIAL  LONG TERM GOALS: Target date: 07/25/2024  1.  Patient (> 24 years old) will complete five times sit to stand test in < 15 seconds indicating an increased LE strength and improved balance. Baseline: 11.36 sec 06/11/24: 12.73  sec 07/23/24: 13.15 sec Goal status: PROGRESSING   2.  Patient will reduce timed up and go to <11 seconds to reduce fall risk and demonstrate improved transfer/gait ability. Baseline: 19.53 sec  10/29: 25.52 sec 07/23/24: 26.24 sec Goal status: PROGRESSING  3.  Patient will increase 10 meter walk test to >1.37m/s as to improve gait speed for better community ambulation and to reduce fall risk. Baseline: TBD 07/23/24: 20.34 sec Goal status: INITIAL  4.  Patient will increase six minute walk test distance to >1000 for progression to community ambulator and improve gait ability. Baseline: 112 feet in 2 min and 8 sec  07/23/24: 161 in 1 min 42 Goal status: INITIAL  5.  Pt will improve their Locomotor Capabilities Index (LCI-5) score by at least 7 points (MCID), demonstrating a clinically meaningful increase in ability to perform basic locomotor tasks with assistance and/or use of aids. Baseline: 06/11/24: 29 08/12/24: 37/56 Goal Status: INITIAL   ASSESSMENT:  CLINICAL IMPRESSION:  During today's  treatment she continued focus on improving independence with functional gait activities. Activities included walking backward, walking with eyes closed, walking around objects and walking up/down stairs while holding objects. She also worked on bending over to pick up weighted objects off of floor level. She was noted to have most difficulty today with side-stepping onto/off of uneven surface (airex) due to decreased balance requiring min. A following LOB. Overall, the patient tolerated all activities well with contact guard assist for safety. She continues to benefit from skilled physical therapy to address residual strength deficits, balance limitations, and functional mobility needs in order to maximize independence and safety with daily and community activities.   OBJECTIVE IMPAIRMENTS: Abnormal gait, decreased activity tolerance, decreased balance, decreased endurance, decreased knowledge of use of DME, decreased mobility, difficulty walking, decreased ROM, decreased strength, prosthetic dependency , and pain.   ACTIVITY LIMITATIONS: carrying, lifting, bending, standing, squatting, stairs, transfers, bathing, toileting, dressing, hygiene/grooming, and caring for others  PARTICIPATION LIMITATIONS: meal prep, cleaning, laundry, driving, shopping, community activity, occupation, and yard work  PERSONAL FACTORS: Age, Behavior pattern, Fitness, Past/current experiences, Profession, Time since onset of injury/illness/exacerbation, and 3+ comorbidities: anemia, breast cancer, HTN, PE's are also affecting patient's functional outcome.   REHAB POTENTIAL: Good  CLINICAL DECISION MAKING: Evolving/moderate complexity  EVALUATION COMPLEXITY: Moderate  PLAN:  PT FREQUENCY: 2x/week PT DURATION: 12 weeks PLANNED INTERVENTIONS: 97750- Physical Performance Testing, 97110-Therapeutic exercises, 97530- Therapeutic activity, [id number]- Neuromuscular re-education, 97535- Self Care, [id number]- Manual therapy, [phone]- Gait  training, [phone]- Orthotic/Prosthetic subsequent, Patient/Family education, Balance training, Stair training, Joint mobilization, and DME instructions  PLAN FOR NEXT SESSION:  -promote improved hip extension.  -Improve independence/safety with gait. -Improve patient's ability to ambulate on uneven surfaces, turn, and onto/off of curbs with decreased risk for falls.  3:31 PM, 09/05/24  Norman KATHEE Sharps PT ,DPT Physical Therapist- [place]  [facility]  [phone]      "

## 2024-09-10 ENCOUNTER — Ambulatory Visit: Admitting: Physical Therapy

## 2024-09-12 ENCOUNTER — Ambulatory Visit

## 2024-09-13 ENCOUNTER — Ambulatory Visit

## 2024-09-17 ENCOUNTER — Ambulatory Visit

## 2024-09-18 ENCOUNTER — Encounter: Payer: Self-pay | Admitting: Internal Medicine

## 2024-09-19 ENCOUNTER — Encounter: Payer: Self-pay | Admitting: Internal Medicine

## 2024-09-19 ENCOUNTER — Ambulatory Visit: Admitting: Physical Therapy

## 2024-09-24 ENCOUNTER — Ambulatory Visit

## 2024-09-26 ENCOUNTER — Ambulatory Visit: Admitting: Physical Therapy

## 2024-10-01 ENCOUNTER — Ambulatory Visit: Admitting: Physical Therapy

## 2024-10-03 ENCOUNTER — Ambulatory Visit

## 2024-10-04 ENCOUNTER — Ambulatory Visit: Admitting: Physical Therapy

## 2024-10-10 ENCOUNTER — Ambulatory Visit: Admitting: Physical Therapy

## 2024-10-18 ENCOUNTER — Ambulatory Visit

## 2024-10-29 ENCOUNTER — Ambulatory Visit

## 2024-11-05 ENCOUNTER — Ambulatory Visit

## 2024-11-08 ENCOUNTER — Ambulatory Visit: Admitting: Physical Therapy

## 2024-11-12 ENCOUNTER — Ambulatory Visit

## 2024-11-12 ENCOUNTER — Other Ambulatory Visit

## 2024-11-12 ENCOUNTER — Ambulatory Visit: Admitting: Internal Medicine

## 2024-11-13 ENCOUNTER — Ambulatory Visit: Admitting: Physical Therapy

## 2024-11-15 ENCOUNTER — Ambulatory Visit: Admitting: Physical Therapy
